# Patient Record
Sex: Female | Born: 1935 | Race: Black or African American | Hispanic: No | Marital: Married | Smoking: Former smoker
Health system: Southern US, Community
[De-identification: ages and names within clinical notes are randomized; demographics above are authoritative.]

## PROBLEM LIST (undated history)

## (undated) DIAGNOSIS — M1990 Unspecified osteoarthritis, unspecified site: Secondary | ICD-10-CM

## (undated) DIAGNOSIS — I2510 Atherosclerotic heart disease of native coronary artery without angina pectoris: Secondary | ICD-10-CM

## (undated) DIAGNOSIS — R42 Dizziness and giddiness: Secondary | ICD-10-CM

## (undated) DIAGNOSIS — I517 Cardiomegaly: Secondary | ICD-10-CM

## (undated) DIAGNOSIS — E7800 Pure hypercholesterolemia, unspecified: Secondary | ICD-10-CM

## (undated) DIAGNOSIS — I10 Essential (primary) hypertension: Secondary | ICD-10-CM

## (undated) DIAGNOSIS — F419 Anxiety disorder, unspecified: Secondary | ICD-10-CM

## (undated) DIAGNOSIS — R600 Localized edema: Secondary | ICD-10-CM

## (undated) DIAGNOSIS — K219 Gastro-esophageal reflux disease without esophagitis: Secondary | ICD-10-CM

## (undated) DIAGNOSIS — Z8711 Personal history of peptic ulcer disease: Secondary | ICD-10-CM

## (undated) DIAGNOSIS — G629 Polyneuropathy, unspecified: Secondary | ICD-10-CM

## (undated) DIAGNOSIS — I480 Paroxysmal atrial fibrillation: Secondary | ICD-10-CM

## (undated) DIAGNOSIS — Z8719 Personal history of other diseases of the digestive system: Secondary | ICD-10-CM

## (undated) DIAGNOSIS — M810 Age-related osteoporosis without current pathological fracture: Secondary | ICD-10-CM

## (undated) DIAGNOSIS — I5032 Chronic diastolic (congestive) heart failure: Secondary | ICD-10-CM

## (undated) DIAGNOSIS — M797 Fibromyalgia: Secondary | ICD-10-CM

## (undated) HISTORY — PX: LAPAROTOMY: SHX154

## (undated) HISTORY — PX: BREAST BIOPSY: SHX20

## (undated) HISTORY — DX: Chronic diastolic (congestive) heart failure: I50.32

## (undated) HISTORY — PX: ABDOMINAL HYSTERECTOMY: SHX81

## (undated) HISTORY — DX: Paroxysmal atrial fibrillation: I48.0

## (undated) HISTORY — PX: TONSILLECTOMY AND ADENOIDECTOMY: SUR1326

## (undated) HISTORY — PX: HERNIA REPAIR: SHX51

## (undated) HISTORY — PX: OTHER SURGICAL HISTORY: SHX169

## (undated) HISTORY — PX: CORONARY ANGIOPLASTY WITH STENT PLACEMENT: SHX49

## (undated) HISTORY — DX: Localized edema: R60.0

## (undated) HISTORY — PX: BREAST LUMPECTOMY: SHX2

## (undated) SURGERY — EGD (ESOPHAGOGASTRODUODENOSCOPY)
Anesthesia: Moderate Sedation | Laterality: Left

---

## 1969-05-03 HISTORY — PX: BREAST LUMPECTOMY: SHX2

## 1969-05-03 HISTORY — PX: ABDOMINAL HYSTERECTOMY: SHX81

## 1969-05-03 HISTORY — PX: BREAST BIOPSY: SHX20

## 1998-01-02 ENCOUNTER — Ambulatory Visit (HOSPITAL_COMMUNITY): Admission: RE | Admit: 1998-01-02 | Discharge: 1998-01-02 | Payer: Self-pay | Admitting: Cardiology

## 1998-09-02 HISTORY — PX: DIAGNOSTIC LAPAROSCOPY: SUR761

## 1998-09-02 HISTORY — PX: LAPAROSCOPIC SALPINGO OOPHERECTOMY: SHX5927

## 1998-09-14 ENCOUNTER — Encounter: Payer: Self-pay | Admitting: *Deleted

## 1998-09-14 ENCOUNTER — Emergency Department (HOSPITAL_COMMUNITY): Admission: EM | Admit: 1998-09-14 | Discharge: 1998-09-14 | Payer: Self-pay | Admitting: Emergency Medicine

## 1998-09-14 ENCOUNTER — Inpatient Hospital Stay (HOSPITAL_COMMUNITY): Admission: EM | Admit: 1998-09-14 | Discharge: 1998-09-29 | Payer: Self-pay | Admitting: *Deleted

## 1998-09-14 ENCOUNTER — Encounter: Payer: Self-pay | Admitting: Emergency Medicine

## 1998-09-19 ENCOUNTER — Encounter: Payer: Self-pay | Admitting: *Deleted

## 1998-09-21 ENCOUNTER — Encounter: Payer: Self-pay | Admitting: *Deleted

## 1998-09-22 ENCOUNTER — Encounter: Payer: Self-pay | Admitting: *Deleted

## 1998-09-24 ENCOUNTER — Encounter: Payer: Self-pay | Admitting: *Deleted

## 1998-09-24 ENCOUNTER — Encounter: Payer: Self-pay | Admitting: Obstetrics and Gynecology

## 1998-10-05 ENCOUNTER — Inpatient Hospital Stay (HOSPITAL_COMMUNITY): Admission: EM | Admit: 1998-10-05 | Discharge: 1998-10-10 | Payer: Self-pay | Admitting: Emergency Medicine

## 1998-10-05 ENCOUNTER — Encounter: Payer: Self-pay | Admitting: Cardiology

## 1998-10-06 ENCOUNTER — Encounter: Payer: Self-pay | Admitting: Cardiology

## 1998-10-07 ENCOUNTER — Encounter: Payer: Self-pay | Admitting: Emergency Medicine

## 1998-10-21 ENCOUNTER — Inpatient Hospital Stay (HOSPITAL_COMMUNITY): Admission: AD | Admit: 1998-10-21 | Discharge: 1998-10-21 | Payer: Self-pay | Admitting: Obstetrics and Gynecology

## 1998-10-22 ENCOUNTER — Encounter: Payer: Self-pay | Admitting: Surgery

## 1998-10-22 ENCOUNTER — Emergency Department (HOSPITAL_COMMUNITY): Admission: EM | Admit: 1998-10-22 | Discharge: 1998-10-22 | Payer: Self-pay | Admitting: Emergency Medicine

## 1998-10-29 ENCOUNTER — Encounter: Payer: Self-pay | Admitting: Emergency Medicine

## 1998-10-29 ENCOUNTER — Emergency Department (HOSPITAL_COMMUNITY): Admission: EM | Admit: 1998-10-29 | Discharge: 1998-10-29 | Payer: Self-pay | Admitting: Emergency Medicine

## 1998-11-24 ENCOUNTER — Emergency Department (HOSPITAL_COMMUNITY): Admission: EM | Admit: 1998-11-24 | Discharge: 1998-11-24 | Payer: Self-pay | Admitting: Emergency Medicine

## 1998-11-24 ENCOUNTER — Encounter: Payer: Self-pay | Admitting: Emergency Medicine

## 1998-11-25 ENCOUNTER — Emergency Department (HOSPITAL_COMMUNITY): Admission: EM | Admit: 1998-11-25 | Discharge: 1998-11-25 | Payer: Self-pay | Admitting: Emergency Medicine

## 1999-04-06 ENCOUNTER — Other Ambulatory Visit: Admission: RE | Admit: 1999-04-06 | Discharge: 1999-04-06 | Payer: Self-pay | Admitting: *Deleted

## 2000-05-16 ENCOUNTER — Encounter: Payer: Self-pay | Admitting: Cardiology

## 2000-05-16 ENCOUNTER — Observation Stay (HOSPITAL_COMMUNITY): Admission: EM | Admit: 2000-05-16 | Discharge: 2000-05-17 | Payer: Self-pay | Admitting: *Deleted

## 2000-06-12 ENCOUNTER — Emergency Department (HOSPITAL_COMMUNITY): Admission: EM | Admit: 2000-06-12 | Discharge: 2000-06-13 | Payer: Self-pay | Admitting: Emergency Medicine

## 2000-06-13 ENCOUNTER — Encounter: Payer: Self-pay | Admitting: Emergency Medicine

## 2000-06-17 ENCOUNTER — Other Ambulatory Visit: Admission: RE | Admit: 2000-06-17 | Discharge: 2000-06-17 | Payer: Self-pay | Admitting: *Deleted

## 2000-07-15 ENCOUNTER — Emergency Department (HOSPITAL_COMMUNITY): Admission: EM | Admit: 2000-07-15 | Discharge: 2000-07-15 | Payer: Self-pay | Admitting: Emergency Medicine

## 2000-07-15 ENCOUNTER — Encounter: Payer: Self-pay | Admitting: Emergency Medicine

## 2001-03-15 ENCOUNTER — Emergency Department (HOSPITAL_COMMUNITY): Admission: EM | Admit: 2001-03-15 | Discharge: 2001-03-16 | Payer: Self-pay | Admitting: Emergency Medicine

## 2001-03-15 ENCOUNTER — Encounter: Payer: Self-pay | Admitting: Emergency Medicine

## 2001-03-16 ENCOUNTER — Encounter: Payer: Self-pay | Admitting: Emergency Medicine

## 2001-06-19 ENCOUNTER — Emergency Department (HOSPITAL_COMMUNITY): Admission: EM | Admit: 2001-06-19 | Discharge: 2001-06-19 | Payer: Self-pay | Admitting: Emergency Medicine

## 2001-06-19 ENCOUNTER — Encounter: Payer: Self-pay | Admitting: Internal Medicine

## 2001-06-19 ENCOUNTER — Encounter: Admission: RE | Admit: 2001-06-19 | Discharge: 2001-06-19 | Payer: Self-pay | Admitting: Internal Medicine

## 2001-06-19 ENCOUNTER — Other Ambulatory Visit: Admission: RE | Admit: 2001-06-19 | Discharge: 2001-06-19 | Payer: Self-pay | Admitting: *Deleted

## 2001-09-09 ENCOUNTER — Emergency Department (HOSPITAL_COMMUNITY): Admission: EM | Admit: 2001-09-09 | Discharge: 2001-09-09 | Payer: Self-pay | Admitting: Emergency Medicine

## 2001-11-20 ENCOUNTER — Encounter: Payer: Self-pay | Admitting: Internal Medicine

## 2001-11-20 ENCOUNTER — Encounter: Admission: RE | Admit: 2001-11-20 | Discharge: 2001-11-20 | Payer: Self-pay | Admitting: Internal Medicine

## 2002-04-09 ENCOUNTER — Encounter: Payer: Self-pay | Admitting: Internal Medicine

## 2002-04-09 ENCOUNTER — Encounter: Admission: RE | Admit: 2002-04-09 | Discharge: 2002-04-09 | Payer: Self-pay | Admitting: Internal Medicine

## 2002-11-05 ENCOUNTER — Encounter: Payer: Self-pay | Admitting: *Deleted

## 2002-11-05 ENCOUNTER — Emergency Department (HOSPITAL_COMMUNITY): Admission: EM | Admit: 2002-11-05 | Discharge: 2002-11-05 | Payer: Self-pay | Admitting: Emergency Medicine

## 2002-11-05 ENCOUNTER — Emergency Department (HOSPITAL_COMMUNITY): Admission: EM | Admit: 2002-11-05 | Discharge: 2002-11-06 | Payer: Self-pay | Admitting: Emergency Medicine

## 2003-07-03 ENCOUNTER — Emergency Department (HOSPITAL_COMMUNITY): Admission: AD | Admit: 2003-07-03 | Discharge: 2003-07-03 | Payer: Self-pay | Admitting: Emergency Medicine

## 2004-07-23 ENCOUNTER — Ambulatory Visit (HOSPITAL_COMMUNITY): Admission: RE | Admit: 2004-07-23 | Discharge: 2004-07-23 | Payer: Self-pay | Admitting: Gastroenterology

## 2004-08-07 ENCOUNTER — Emergency Department (HOSPITAL_COMMUNITY): Admission: EM | Admit: 2004-08-07 | Discharge: 2004-08-07 | Payer: Self-pay | Admitting: Emergency Medicine

## 2004-09-16 ENCOUNTER — Emergency Department (HOSPITAL_COMMUNITY): Admission: EM | Admit: 2004-09-16 | Discharge: 2004-09-17 | Payer: Self-pay | Admitting: Emergency Medicine

## 2004-11-02 ENCOUNTER — Ambulatory Visit (HOSPITAL_COMMUNITY): Admission: RE | Admit: 2004-11-02 | Discharge: 2004-11-02 | Payer: Self-pay | Admitting: Family Medicine

## 2004-11-02 ENCOUNTER — Emergency Department (HOSPITAL_COMMUNITY): Admission: EM | Admit: 2004-11-02 | Discharge: 2004-11-02 | Payer: Self-pay | Admitting: Family Medicine

## 2005-06-13 ENCOUNTER — Inpatient Hospital Stay (HOSPITAL_COMMUNITY): Admission: EM | Admit: 2005-06-13 | Discharge: 2005-06-16 | Payer: Self-pay | Admitting: Family Medicine

## 2005-06-19 ENCOUNTER — Emergency Department (HOSPITAL_COMMUNITY): Admission: EM | Admit: 2005-06-19 | Discharge: 2005-06-20 | Payer: Self-pay | Admitting: Emergency Medicine

## 2005-06-27 ENCOUNTER — Inpatient Hospital Stay (HOSPITAL_COMMUNITY): Admission: EM | Admit: 2005-06-27 | Discharge: 2005-06-28 | Payer: Self-pay | Admitting: Emergency Medicine

## 2005-07-02 ENCOUNTER — Encounter: Admission: RE | Admit: 2005-07-02 | Discharge: 2005-07-02 | Payer: Self-pay | Admitting: Gastroenterology

## 2005-07-05 ENCOUNTER — Ambulatory Visit (HOSPITAL_COMMUNITY): Admission: RE | Admit: 2005-07-05 | Discharge: 2005-07-05 | Payer: Self-pay | Admitting: Gastroenterology

## 2005-07-14 ENCOUNTER — Encounter: Admission: RE | Admit: 2005-07-14 | Discharge: 2005-07-14 | Payer: Self-pay | Admitting: Gastroenterology

## 2005-09-09 ENCOUNTER — Emergency Department (HOSPITAL_COMMUNITY): Admission: EM | Admit: 2005-09-09 | Discharge: 2005-09-09 | Payer: Self-pay | Admitting: Family Medicine

## 2006-01-12 ENCOUNTER — Emergency Department (HOSPITAL_COMMUNITY): Admission: EM | Admit: 2006-01-12 | Discharge: 2006-01-12 | Payer: Self-pay | Admitting: Emergency Medicine

## 2006-03-03 ENCOUNTER — Emergency Department (HOSPITAL_COMMUNITY): Admission: EM | Admit: 2006-03-03 | Discharge: 2006-03-03 | Payer: Self-pay | Admitting: Emergency Medicine

## 2006-06-25 ENCOUNTER — Emergency Department (HOSPITAL_COMMUNITY): Admission: EM | Admit: 2006-06-25 | Discharge: 2006-06-25 | Payer: Self-pay | Admitting: Emergency Medicine

## 2006-08-27 ENCOUNTER — Observation Stay (HOSPITAL_COMMUNITY): Admission: EM | Admit: 2006-08-27 | Discharge: 2006-08-28 | Payer: Self-pay | Admitting: Emergency Medicine

## 2006-08-27 DIAGNOSIS — R42 Dizziness and giddiness: Secondary | ICD-10-CM

## 2006-08-27 HISTORY — DX: Dizziness and giddiness: R42

## 2006-09-14 ENCOUNTER — Emergency Department (HOSPITAL_COMMUNITY): Admission: EM | Admit: 2006-09-14 | Discharge: 2006-09-15 | Payer: Self-pay | Admitting: Emergency Medicine

## 2006-10-03 HISTORY — PX: CARDIAC CATHETERIZATION: SHX172

## 2006-10-14 ENCOUNTER — Inpatient Hospital Stay (HOSPITAL_COMMUNITY): Admission: EM | Admit: 2006-10-14 | Discharge: 2006-10-17 | Payer: Self-pay | Admitting: Family Medicine

## 2007-05-19 ENCOUNTER — Emergency Department (HOSPITAL_COMMUNITY): Admission: EM | Admit: 2007-05-19 | Discharge: 2007-05-19 | Payer: Self-pay | Admitting: Emergency Medicine

## 2007-05-21 ENCOUNTER — Emergency Department (HOSPITAL_COMMUNITY): Admission: EM | Admit: 2007-05-21 | Discharge: 2007-05-21 | Payer: Self-pay | Admitting: Emergency Medicine

## 2007-08-03 ENCOUNTER — Emergency Department (HOSPITAL_COMMUNITY): Admission: EM | Admit: 2007-08-03 | Discharge: 2007-08-03 | Payer: Self-pay | Admitting: Emergency Medicine

## 2007-12-19 ENCOUNTER — Emergency Department (HOSPITAL_COMMUNITY): Admission: EM | Admit: 2007-12-19 | Discharge: 2007-12-19 | Payer: Self-pay | Admitting: Emergency Medicine

## 2008-01-13 ENCOUNTER — Encounter: Admission: RE | Admit: 2008-01-13 | Discharge: 2008-01-13 | Payer: Self-pay | Admitting: Family Medicine

## 2008-02-25 ENCOUNTER — Inpatient Hospital Stay (HOSPITAL_COMMUNITY): Admission: EM | Admit: 2008-02-25 | Discharge: 2008-03-02 | Payer: Self-pay | Admitting: Emergency Medicine

## 2008-02-25 ENCOUNTER — Encounter (INDEPENDENT_AMBULATORY_CARE_PROVIDER_SITE_OTHER): Payer: Self-pay | Admitting: Cardiovascular Disease

## 2008-02-25 ENCOUNTER — Ambulatory Visit: Payer: Self-pay | Admitting: Vascular Surgery

## 2008-03-24 ENCOUNTER — Ambulatory Visit (HOSPITAL_COMMUNITY): Admission: RE | Admit: 2008-03-24 | Discharge: 2008-03-24 | Payer: Self-pay | Admitting: Cardiology

## 2008-09-13 ENCOUNTER — Emergency Department (HOSPITAL_COMMUNITY): Admission: EM | Admit: 2008-09-13 | Discharge: 2008-09-14 | Payer: Self-pay | Admitting: Emergency Medicine

## 2008-12-28 ENCOUNTER — Emergency Department (HOSPITAL_COMMUNITY): Admission: EM | Admit: 2008-12-28 | Discharge: 2008-12-28 | Payer: Self-pay | Admitting: *Deleted

## 2009-04-17 ENCOUNTER — Inpatient Hospital Stay (HOSPITAL_COMMUNITY): Admission: EM | Admit: 2009-04-17 | Discharge: 2009-04-19 | Payer: Self-pay | Admitting: Emergency Medicine

## 2009-08-22 ENCOUNTER — Encounter: Admission: RE | Admit: 2009-08-22 | Discharge: 2009-08-22 | Payer: Self-pay | Admitting: Internal Medicine

## 2009-09-16 ENCOUNTER — Emergency Department (HOSPITAL_COMMUNITY): Admission: EM | Admit: 2009-09-16 | Discharge: 2009-09-17 | Payer: Self-pay | Admitting: Emergency Medicine

## 2010-04-07 ENCOUNTER — Emergency Department (HOSPITAL_COMMUNITY): Admission: EM | Admit: 2010-04-07 | Discharge: 2010-04-07 | Payer: Self-pay | Admitting: Emergency Medicine

## 2010-11-05 ENCOUNTER — Other Ambulatory Visit: Payer: Self-pay | Admitting: Adult Health Nurse Practitioner

## 2010-11-05 ENCOUNTER — Ambulatory Visit
Admission: RE | Admit: 2010-11-05 | Discharge: 2010-11-05 | Disposition: A | Discharge status: Home / Self Care | Payer: Medicare Other | Source: Ambulatory Visit | Attending: Adult Health Nurse Practitioner | Admitting: Adult Health Nurse Practitioner

## 2010-11-05 DIAGNOSIS — R509 Fever, unspecified: Secondary | ICD-10-CM

## 2010-11-05 DIAGNOSIS — R05 Cough: Secondary | ICD-10-CM

## 2010-11-16 LAB — POCT I-STAT, CHEM 8
BUN: 9 mg/dL (ref 6–23)
Calcium, Ion: 1.1 mmol/L — ABNORMAL LOW (ref 1.12–1.32)
Chloride: 104 mEq/L (ref 96–112)
Creatinine, Ser: 1 mg/dL (ref 0.4–1.2)
Glucose, Bld: 122 mg/dL — ABNORMAL HIGH (ref 70–99)
Potassium: 3.1 mEq/L — ABNORMAL LOW (ref 3.5–5.1)

## 2010-11-16 LAB — CBC
MCV: 83.4 fL (ref 78.0–100.0)
Platelets: 216 10*3/uL (ref 150–400)
RBC: 4.93 MIL/uL (ref 3.87–5.11)
WBC: 6.2 10*3/uL (ref 4.0–10.5)

## 2010-11-16 LAB — URINALYSIS, ROUTINE W REFLEX MICROSCOPIC
Glucose, UA: NEGATIVE mg/dL
Hgb urine dipstick: NEGATIVE
Specific Gravity, Urine: 1.021 (ref 1.005–1.030)
Urobilinogen, UA: 0.2 mg/dL (ref 0.0–1.0)

## 2010-11-16 LAB — HEPATIC FUNCTION PANEL
ALT: 23 U/L (ref 0–35)
AST: 26 U/L (ref 0–37)
Albumin: 3.7 g/dL (ref 3.5–5.2)
Alkaline Phosphatase: 86 U/L (ref 39–117)
Total Protein: 6.8 g/dL (ref 6.0–8.3)

## 2010-11-16 LAB — DIFFERENTIAL
Eosinophils Relative: 1 % (ref 0–5)
Lymphocytes Relative: 45 % (ref 12–46)
Lymphs Abs: 2.8 10*3/uL (ref 0.7–4.0)
Neutro Abs: 3.1 10*3/uL (ref 1.7–7.7)

## 2010-11-18 LAB — DIFFERENTIAL
Basophils Relative: 1 % (ref 0–1)
Eosinophils Absolute: 0.1 10*3/uL (ref 0.0–0.7)
Eosinophils Relative: 2 % (ref 0–5)
Lymphs Abs: 2.1 10*3/uL (ref 0.7–4.0)
Monocytes Relative: 5 % (ref 3–12)
Neutrophils Relative %: 62 % (ref 43–77)

## 2010-11-18 LAB — BASIC METABOLIC PANEL
BUN: 8 mg/dL (ref 6–23)
CO2: 28 mEq/L (ref 19–32)
Chloride: 102 mEq/L (ref 96–112)
Creatinine, Ser: 0.79 mg/dL (ref 0.4–1.2)
Potassium: 3.7 mEq/L (ref 3.5–5.1)

## 2010-11-18 LAB — CBC
HCT: 37.5 % (ref 36.0–46.0)
MCHC: 33.4 g/dL (ref 30.0–36.0)
MCV: 84.1 fL (ref 78.0–100.0)
Platelets: 286 10*3/uL (ref 150–400)

## 2010-12-08 LAB — CARDIAC PANEL(CRET KIN+CKTOT+MB+TROPI)
Relative Index: 1.7 (ref 0.0–2.5)
Total CK: 244 U/L — ABNORMAL HIGH (ref 7–177)
Troponin I: 0.02 ng/mL (ref 0.00–0.06)

## 2010-12-08 LAB — BASIC METABOLIC PANEL
CO2: 27 mEq/L (ref 19–32)
Calcium: 8.9 mg/dL (ref 8.4–10.5)
Creatinine, Ser: 1.07 mg/dL (ref 0.4–1.2)
GFR calc Af Amer: >60 mL/min (ref >60)
GFR calc non Af Amer: 51 mL/min — ABNORMAL LOW (ref >60)
Glucose, Bld: 112 mg/dL — ABNORMAL HIGH (ref 70–99)
Sodium: 137 mEq/L (ref 135–145)

## 2010-12-08 LAB — CBC
HCT: 38.4 % (ref 36.0–46.0)
Hemoglobin: 12.4 g/dL (ref 12.0–15.0)
Hemoglobin: 12.7 g/dL (ref 12.0–15.0)
MCHC: 33.6 g/dL (ref 30.0–36.0)
MCV: 85.4 fL (ref 78.0–100.0)
MCV: 85.6 fL (ref 78.0–100.0)
Platelets: 226 10*3/uL (ref 150–400)
Platelets: 245 10*3/uL (ref 150–400)
RBC: 4.06 MIL/uL (ref 3.87–5.11)
RBC: 4.5 MIL/uL (ref 3.87–5.11)
RDW: 14.7 % (ref 11.5–15.5)
WBC: 6.9 10*3/uL (ref 4.0–10.5)
WBC: 7.2 10*3/uL (ref 4.0–10.5)

## 2010-12-08 LAB — DIFFERENTIAL
Basophils Absolute: 0.3 10*3/uL — ABNORMAL HIGH (ref 0.0–0.1)
Basophils Relative: 4 % — ABNORMAL HIGH (ref 0–1)
Eosinophils Absolute: 0.1 10*3/uL (ref 0.0–0.7)
Eosinophils Relative: 1 % (ref 0–5)
Lymphocytes Relative: 31 % (ref 12–46)
Monocytes Absolute: 0.4 10*3/uL (ref 0.1–1.0)

## 2010-12-08 LAB — CK TOTAL AND CKMB (NOT AT ARMC)
CK, MB: 4.2 ng/mL — ABNORMAL HIGH (ref 0.3–4.0)
Relative Index: 1.4 (ref 0.0–2.5)
Total CK: 292 U/L — ABNORMAL HIGH (ref 7–177)

## 2010-12-08 LAB — HEPATIC FUNCTION PANEL
ALT: 21 U/L (ref 0–35)
AST: 26 U/L (ref 0–37)
Albumin: 3.2 g/dL — ABNORMAL LOW (ref 3.5–5.2)
Alkaline Phosphatase: 67 U/L (ref 39–117)
Bilirubin, Direct: <0.1 mg/dL (ref 0.0–0.3)
Total Bilirubin: 0.6 mg/dL (ref 0.3–1.2)

## 2010-12-08 LAB — TROPONIN I: Troponin I: 0.01 ng/mL (ref 0.00–0.06)

## 2010-12-08 LAB — LIPID PANEL
HDL: 42 mg/dL (ref >39)
Total CHOL/HDL Ratio: 6.5 RATIO
Triglycerides: 113 mg/dL (ref <150)
VLDL: 23 mg/dL (ref 0–40)

## 2010-12-08 LAB — LIPASE, BLOOD: Lipase: 115 U/L — ABNORMAL HIGH (ref 11–59)

## 2010-12-08 LAB — POCT CARDIAC MARKERS
CKMB, poc: 2.8 ng/mL (ref 1.0–8.0)
Myoglobin, poc: 141 ng/mL (ref 12–200)
Troponin i, poc: <0.05 ng/mL (ref 0.00–0.09)

## 2010-12-08 LAB — TSH: TSH: 3.109 u[IU]/mL (ref 0.350–4.500)

## 2010-12-08 LAB — APTT: aPTT: 52 seconds — ABNORMAL HIGH (ref 24–37)

## 2010-12-12 LAB — POCT I-STAT, CHEM 8
BUN: 15 mg/dL (ref 6–23)
Creatinine, Ser: 1.1 mg/dL (ref 0.4–1.2)
Glucose, Bld: 114 mg/dL — ABNORMAL HIGH (ref 70–99)
Potassium: 3.6 mEq/L (ref 3.5–5.1)
Sodium: 142 mEq/L (ref 135–145)
TCO2: 30 mmol/L (ref 0–100)

## 2010-12-12 LAB — DIFFERENTIAL
Eosinophils Relative: 1 % (ref 0–5)
Lymphocytes Relative: 36 % (ref 12–46)
Lymphs Abs: 2.4 10*3/uL (ref 0.7–4.0)
Monocytes Relative: 5 % (ref 3–12)

## 2010-12-12 LAB — CBC
HCT: 37.5 % (ref 36.0–46.0)
MCV: 86.1 fL (ref 78.0–100.0)
Platelets: 286 10*3/uL (ref 150–400)
RBC: 4.36 MIL/uL (ref 3.87–5.11)
WBC: 6.6 10*3/uL (ref 4.0–10.5)

## 2010-12-12 LAB — POCT CARDIAC MARKERS
CKMB, poc: 1.7 ng/mL (ref 1.0–8.0)
Myoglobin, poc: 88.5 ng/mL (ref 12–200)

## 2010-12-17 LAB — CBC
HCT: 39.9 % (ref 36.0–46.0)
Hemoglobin: 12.9 g/dL (ref 12.0–15.0)
MCHC: 32.4 g/dL (ref 30.0–36.0)
MCV: 85.1 fL (ref 78.0–100.0)
Platelets: 274 10*3/uL (ref 150–400)
RBC: 4.69 MIL/uL (ref 3.87–5.11)
RDW: 15.4 % (ref 11.5–15.5)
WBC: 8.5 10*3/uL (ref 4.0–10.5)

## 2010-12-17 LAB — COMPREHENSIVE METABOLIC PANEL
ALT: 20 U/L (ref 0–35)
AST: 21 U/L (ref 0–37)
Albumin: 3.7 g/dL (ref 3.5–5.2)
Alkaline Phosphatase: 80 U/L (ref 39–117)
BUN: 13 mg/dL (ref 6–23)
CO2: 33 mEq/L — ABNORMAL HIGH (ref 19–32)
Calcium: 9.4 mg/dL (ref 8.4–10.5)
Chloride: 101 mEq/L (ref 96–112)
Creatinine, Ser: 1.01 mg/dL (ref 0.4–1.2)
GFR calc Af Amer: >60 mL/min (ref >60)
GFR calc non Af Amer: 54 mL/min — ABNORMAL LOW (ref >60)
Glucose, Bld: 109 mg/dL — ABNORMAL HIGH (ref 70–99)
Potassium: 3.9 mEq/L (ref 3.5–5.1)
Sodium: 144 mEq/L (ref 135–145)
Total Bilirubin: 0.5 mg/dL (ref 0.3–1.2)
Total Protein: 6.8 g/dL (ref 6.0–8.3)

## 2010-12-17 LAB — URINALYSIS, ROUTINE W REFLEX MICROSCOPIC
Nitrite: NEGATIVE
Protein, ur: NEGATIVE mg/dL
Specific Gravity, Urine: 1.026 (ref 1.005–1.030)
Urobilinogen, UA: 0.2 mg/dL (ref 0.0–1.0)

## 2010-12-17 LAB — DIFFERENTIAL
Basophils Absolute: 0 10*3/uL (ref 0.0–0.1)
Basophils Relative: 0 % (ref 0–1)
Eosinophils Absolute: 0.1 10*3/uL (ref 0.0–0.7)
Eosinophils Relative: 1 % (ref 0–5)
Lymphocytes Relative: 35 % (ref 12–46)
Lymphs Abs: 3 10*3/uL (ref 0.7–4.0)
Monocytes Absolute: 0.4 10*3/uL (ref 0.1–1.0)
Monocytes Relative: 5 % (ref 3–12)
Neutro Abs: 5 10*3/uL (ref 1.7–7.7)
Neutrophils Relative %: 59 % (ref 43–77)

## 2010-12-17 LAB — POCT I-STAT 3, ART BLOOD GAS (G3+)
Acid-Base Excess: 4 mmol/L — ABNORMAL HIGH (ref 0.0–2.0)
Bicarbonate: 29.4 mEq/L — ABNORMAL HIGH (ref 20.0–24.0)
Patient temperature: 98.6

## 2010-12-19 ENCOUNTER — Other Ambulatory Visit: Payer: Self-pay | Admitting: Internal Medicine

## 2010-12-19 DIAGNOSIS — R1032 Left lower quadrant pain: Secondary | ICD-10-CM

## 2010-12-26 ENCOUNTER — Ambulatory Visit
Admission: RE | Admit: 2010-12-26 | Discharge: 2010-12-26 | Disposition: A | Discharge status: Home / Self Care | Payer: Medicare Other | Source: Ambulatory Visit | Attending: Internal Medicine | Admitting: Internal Medicine

## 2010-12-26 DIAGNOSIS — R1032 Left lower quadrant pain: Secondary | ICD-10-CM

## 2010-12-26 MED ORDER — IOHEXOL 300 MG/ML  SOLN
125.0000 mL | Freq: Once | INTRAMUSCULAR | Status: AC | PRN
  Administered 2010-12-26: 125 mL via INTRAVENOUS

## 2011-01-15 NOTE — H&P (Signed)
[name], [name]              ACCOUNT NO.:  [id number]   MEDICAL RECORD NO.:  [id number]          PATIENT TYPE:  INP   LOCATION:  1824                         FACILITY:  [facility]   PHYSICIAN:  [name], M.D.DATE OF BIRTH:  [birth year]   DATE OF ADMISSION:  08/03/2007  DATE OF DISCHARGE:                              HISTORY & PHYSICAL   CHIEF COMPLAINT:  Chest pain.   HISTORY OF PRESENT ILLNESS:  [name] is a pleasant, 75 year old  female followed by [name] and [name] with a I of coronary  disease. She had a LAD CYPHER stent June 15, 2005. She had had a  negative nuclear study in April 2006 prior to this. She was readmitted  June 27, 2005 with chest pain that was felt to be noncardiac and she  had a GI evaluation by [name]. She was readmitted again in February  2008 with chest pain. Catheterization was done by [name]. This  revealed a 50% proximal RCA and 40% LAD and normal LV function. She did  have a Myoview during that admission prior to discharge which showed no  ischemia with good LV function. Prior to this admission, she had had  some problems with multiple medications and had actually been off her  Toprol, Crestor, Cardizem and Plavix. We had resumed her Plavix at  discharge and she seemed to be tolerating this. She has seen [name]  in followup and last saw him on June 12, 2007. At that time, she was  doing well. She was not having chest pain. She was not on her Plavix  which she apparently ended up stopping again in the spring of 2008  because of side effects. She is seen now with complaints again of  substernal chest pain and pressure. She actually had symptoms starting a  week or so ago. Her symptoms are not exertional. Over the weekend, she  had some increase in her symptoms with some associated left arm pain and  jaw pain. She denies any nausea, vomiting or diaphoresis although she  does say she has some dyspnea.   CURRENT  MEDICATIONS:  1. Diovan 160 mg a day.  2. Aspirin 81 mg a day.  3. Lasix 80 mg a day.  4. Multivitamin.  5. Potassium 20 mEq a day.  6. Nexium 40 mg a day.  7. Claritin 10 mg a day.  8. Lyrica 50 mg a day.   She is intolerant to ACE INHIBITORS although the reason is not  documented.   SOCIAL HISTORY:  She is a retired Landscape architect.  She is a nonsmoker. She is married and lives with her husband. She has a  set of twins, one is at [place] and one is at [facility].   FAMILY HISTORY:  Unremarkable.   REVIEW OF SYSTEMS:  Essentially unremarkable except for as noted above.   PHYSICAL EXAMINATION:  Blood pressure 138/80, pulse 90, temperature 98,  O2 sat is 96% on room air.  GENERAL:  She is a well-developed, well-nourished, African-American  female in no acute distress.  HEENT:  Normocephalic. Extraocular movements intact. Sclera is  nonicteric.  NECK:  Without JVD or bruit.  CHEST:  Clear to auscultation and percussion.  CARDIAC:  Regular rate and rhythm without murmurs, rubs or gallops.  Normal S1, S2.  ABDOMEN:  Nontender. No hepatosplenomegaly.  EXTREMITIES:  Without edema. Distal pulses are intact.  NEUROLOGIC:  Grossly intact. She is awake, alert, oriented and  cooperative and moves all extremities without obvious deficit.  SKIN:  Warm and dry.   EKG reveals sinus rhythm without acute changes.   IMPRESSION:  1. Chest pain consistent with unstable angina.  2. Known coronary disease with CYPHER stent placement June 15, 2005, relook October 15, 2006 showing no restenosis, the patient      has subsequently been intolerant to Plavix and has stopped this.  3. Treated hypertension.  4. Gastroesophageal reflux followed by [name].  5. Prior history of multiple abdominal surgeries in the past or      abdominal pain.  6. History of dyslipidemia with a statin intolerance.   PLAN:  The patient will need to be admitted to rule out MI. Will  need to  once again discuss her medications including Plavix and statins. She may  need restudy versus nuclear evaluation.      [name], P.A.    ______________________________  [name], M.D.    [name]  D:  08/03/2007  T:  08/03/2007  Job:  [id number]
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
## 2011-01-15 NOTE — Discharge Summary (Signed)
[name], [name]              ACCOUNT NO.:  [id number]   MEDICAL RECORD NO.:  [id number]          PATIENT TYPE:  INP   LOCATION:  2031                         FACILITY:  [facility]   PHYSICIAN:  Madaline Savage, M.D.DATE OF BIRTH:  1936/02/15   DATE OF ADMISSION:  02/25/2008  DATE OF DISCHARGE:  03/02/2008                               DISCHARGE SUMMARY   DISCHARGE DIAGNOSES:  1. Chest pain, negative myocardial infarction.  2. Coronary disease with cardiac cath at this admission which does      reveal 60% ostial right coronary artery lesion with damping of the      catheter and previous stent to the mid left anterior descending      with patent and she has 30% ostial circumflex stenosis.  Ejection      fraction was 60%.  She did have mitral valve prolapse with trace      mitral regurgitation.      a.     Nuclear stress test Persantine Myoview was negative for       ischemia on the day after the cath to ensure that the 60% right       coronary artery lesion was not causing any chest pain.  3. Significant headache with Imdur.  4. Left leg edema.  Venous Dopplers were negative.  Abdominal and      pelvic CT negative for source of left leg edema.      a.     Plan to do outpatient arterial Dopplers.  5. Dyslipidemia.  6. Fibromyalgia.   DISCHARGE CONDITION:  Stable.   PROCEDURES:  February 29, 2008, combined left heart cath by Dr. Julieanne Manson.   DISCHARGE MEDICATIONS:  1. Aspirin 81 mg daily.  2. Claritin 10 mg daily.  3. Lasix 80 daily.  4. Nexium 40 mg, she was every other day but now she will take it      daily.  5. Potassium 20 mEq daily.  6. Diovan, we increased it to 320 mg daily.  7. Xanax 0.5 mg 3 times a day as needed.  8. Crestor 10 mg daily.  9. Multivitamins once daily.  10.Addition of Lopressor 25 mg one twice a day.   DISCHARGE INSTRUCTIONS:  1. Low-sodium heart-healthy diet, though she was instructed she could      be borderline diabetic.  She is to watch  her diet with      carbohydrates, no or very little sweets, change bread to wheat or      dark bread, sweet potatoes better than white potatoes.  2. Wash right groin cath site with soap and water.  Call if any      bleeding, swelling, or drainage.  3. Increase activity slowly.  4. No lifting for 1 day and no driving for 1 day, may shower.  5. Follow up with Dr. Elsie Lincoln on March 22, 2008 at 9:30 a.m.  6. Follow up with Dr. Renae Gloss for borderline sugars.  7. Follow up with Dr. Corliss Skains for fibromyalgia.  8. Wear wooden boot while weightbearing.   HISTORY OF PRESENT ILLNESS:  A 75 year old African American  female who  presented to the emergency room on February 25, 2008 with complaints of  shortness of breath and chest tightness.  She developed left arm pain 2  days prior to admission which was persistent and the night before  admission she was very uncomfortable with dyspnea, chest tightness, left  arm and back pain.  She called [facility] and was  advised to go to the emergency room.  She also relates she had been  having foot swelling for 2 months and few days ago was seen in the [facility].  She was told us it was not DVT, but referred to the  cardiologist and vascular specialist.  She did take nitroglycerin at  home which did partially relieved her chest pain.   ALLERGIES:  ACE INHIBITOR, PLAVIX caused rash, IMDUR caused severe  headache, TORADOL allergy, ZOCOR allergy, and CODEINE.   Please note, on the patient's the leg swelling, she originally had pain,  saw Dr. Corliss Skains for her edema, had any foot pain, injected her foot  with steroids and since that time pain continues and still with left leg  swelling.  She has also seen orthopedist.  She saw Dr. Renae Gloss and MRI  of the ankle and foot have been done and no reason for the swelling is  noted.   FAMILY HISTORY/SOCIAL HISTORY/REVIEW OF SYSTEMS:  See H&P.  Please note,  the patient does have a history of  coronary disease with history of mid  LAD intervention with Cypher stent in the past and this was in 2006.  Left main was 20%, RCA was 40%.   She also had some mild pulmonary hypertension.   The patient was admitted to [facility], actually to CCU and with  plans for cardiac catheterization.  She was placed on IV heparin and  nitro.  Plans were for angiogram.  The patient's D-dimer was negative.  CT of the chest showed no PE and she then underwent cardiac  catheterization, unfortunately unable to do on Friday the 26th due to  full schedule and she was held and it was done on February 29, 2008 by Dr.  Clarene Duke with results as previously stated.  She then had nuke study which  was negative and then by March 02, 2008 CT of the abdomen and pelvis were  again negative for causes of her left leg edema.  Dr. Elsie Lincoln requested  wooden boot on her foot and felt she was ready for discharge home.   DISCHARGE PHYSICAL EXAMINATION:  VITAL SIGNS:  Blood pressure 136/80,  pulse 72, respirations 18, temperature 97.8, and oxygen saturation on  room air 94%.  No change in physical exam.  Cath site was stable.   LABORATORY DATA:  Initial labs, hemoglobin 12.4, hematocrit 38.2, WBC  8.1, and platelets 258.  These remained stable throughout  hospitalization.  At discharge, hemoglobin 11.8, hematocrit 35.2.   Protime on admission 13.9, INR 1.1, PTT 99, and D-dimer was 0.24.  On  heparin, she was therapeutic.   Chemistry:  Sodium 139, potassium 3.8, chloride 104, CO2 31, glucose  157, BUN 12, creatinine 1.1, and GFR greater than 60.  Magnesium was  2.1, calcium 9.1.  These remained stable.  Glucose was slightly elevated  109, 105, and 157 on admission.   Glycohemoglobin was 6.2, mean plasma glucose was 43.   Cardiac markers initially myoglobin 116, troponin 0.05, and CK-MB was  1.7 and followup CKs 174, 161, MBs 1.6, 1.7, troponin I 0.01, all  negative for MI.  BNP on admission was 70.  TSH 2.962.   Total  cholesterol 168, triglycerides 60, HDL 41, LDL 107.   RADIOLOGY RESULTS:  Original CT of her chest on the 25th, suboptimal  inspiration, no active disease.  CT angiogram, no acute disease in the  chest, no PE, there are few small emphysematous blebs.  Nuke study  revealed no ischemia, EF was 75%, and normal wall motion.   CT of the abdomen, no acute abnormalities that are significant or  significant change.  She does have a right hepatic cyst.  CT of the  pelvis, descending sigmoid and colonic diverticulosis identified without  evidence of diverticulitis.  She has had a hysterectomy, appendix, and  the remainder of the bowel are unremarkable.  The bladder is within  normal limits.  There is no evidence of mass, enlarged lymph node, or  free fluid.  No acute or suspicious bony abnormalities were identified.  Mild disk bulge within the lumbar spine, appears stable.  No pelvic  abnormalities to suggest a cause for the patient's left lower extremity  swelling.  EKGs on admission, sinus rhythm, no acute abnormality.  Followups remained the same to nonspecific T-wave abnormality.   Please note, when the patient was originally seen in the emergency room  in April with the initial left foot pain, she stated she had stretched  that foot and felt something pop in the foot and since then issues have  arisen.  The patient will have lower extremity arterial Dopplers in our  office and then follow up with Dr. Elsie Lincoln.      [name]. [name], N.P.    ______________________________  Madaline Savage, M.D.    LRI/MEDQ  D:  03/02/2008  T:  03/03/2008  Job:  [id number]   cc:   Merlene Laughter. Renae Gloss, M.D.  Kathryne Hitch, MD

## 2011-01-15 NOTE — Cardiovascular Report (Signed)
[name], [name]              ACCOUNT NO.:  [id number]   MEDICAL RECORD NO.:  [id number]           PATIENT TYPE:   LOCATION:                                 FACILITY:   PHYSICIAN:  Thereasa Solo. Little, M.D. DATE OF BIRTH:  02/19/1936   DATE OF PROCEDURE:  DATE OF DISCHARGE:                            CARDIAC CATHETERIZATION   INDICATIONS FOR TEST:  This 75 year old female was admitted with chest  pain.  She had had a Cypher stent placed to her LAD, October 2006.  She  developed a rash from Plavix, and the Plavix was discontinued about a  year ago.  She began having what she describes as the gaseous sensation  with some awareness of discomfort in her left arm.  Her cardiac markers  were unremarkable.  EKG is negative.  She is brought to the cath lab for  cardiac catheterization.   After obtaining informed consent, the patient was prepped and draped in  the usual sterile fashion exposing the right groin.  Following local  anesthetic with 1% Xylocaine, the Seldinger technique was employed and a  5-French introducer sheath was placed into the right femoral artery.  A  Smart needle was used for arterial access.   Left and right coronary arteriography, ventriculography, and a distal  aortogram was performed.   EQUIPMENT:  5-French Judkins configuration catheters.   TOTAL CONTRAST:  130 mL.   MEDICATIONS:  1 mg of IV Versed and 200 mcg of intracoronary  nitroglycerin and 10 of labetalol.   COMPLICATIONS:  At the end of the procedure, the sheath clotted off.  We  were able to aspirate the clot out of it.  It again clotted as second  time, we once again were able to aspirate the clot out and then removed  the sheath on the table.   RESULTS:  1. Hemodynamic monitoring:  Her central aortic pressure was 196/95.      Her left ventricular pressure was 188/12 with no significant      gradient appreciated at the time of pullback.  Her heart rate was      in the mid 60s and sinus.  2.  Ventriculography:  Ventriculography in the RAO projection done at      the end of the case using 25 mL or 12 mL per second revealed mild      left ventricular hypertrophy.  Ejection fraction was in excess of      60%.  There was mitral valve prolapse with trace mitral      regurgitation.  3. Distal aortogram:  A distal aortogram done at the level of the      renal artery showed no renal artery stenosis, no infrarenal      aneurysm, and no proximal iliac disease.  4. Coronary arteriography:  On fluoroscopy, there was a fleck of      calcium at the ostium of the RCA.      a.     Left main normal that bifurcated.      b.     LAD.  The LAD crossed the apex of this heart.  There was a       stent in the mid LAD that was widely patent.  The LAD itself was       free of significant disease and there was a small diagonal branch       that had minimal ostial narrowing.      c.     Circumflex.  The circumflex gave rise to 3 OM vessels.  They       were all free of disease.  There was 30% ostial narrowing of the       circumflex.      d.     Right coronary artery.  The right coronary artery had ostial       60% narrowing.  A 5-French catheter would damp each time the right       coronary artery was engaged.  I gave her 200 mcg of       intracoronary/aortic root IV nitroglycerin.  The entire vessel       became larger and the ostium still appeared to be around 60%       narrowed.  The PDA and posterior lateral vessels were free of       disease.   CONCLUSION:  1. Normal LV function.  2. Mild LVH.  3. No renal artery stenosis, abdominal aortic aneurysm.   Widely patent stent in the LAD.   Ostial 60% narrowing of the RCA with damping her 5-French catheter.   I reviewed the cardiac catheter from 2008.  At that time, she had the  same amount of ostial narrowing in the right coronary arteries as seen  today.  To resolve this issue, a Cardiolite study could be performed, if  there is inferior  ischemia, then slightly high risk and complex ostial  RCA intervention can be performed.   For the time being, I have placed her on Imdur 30, increased her Diovan  from 160 to 320.  I left her Lopressor at 25 mg b.i.d. because her heart  rate is currently in the mid 60s.           ______________________________  Thereasa Solo. Little, M.D.     ABL/MEDQ  D:  02/29/2008  T:  03/01/2008  Job:  [id number]   cc:   Madaline Savage, M.D.  Cath Lab  [place] R. Renae Gloss, M.D.

## 2011-01-18 NOTE — H&P (Signed)
[name], [name] NO.:  [id number]   MEDICAL RECORD NO.:  [id number]          PATIENT TYPE:  EMS   LOCATION:  MAJO                         FACILITY:  [facility]   PHYSICIAN:  Lonia Blood, M.D.DATE OF BIRTH:  11/03/1935   DATE OF ADMISSION:  08/27/2006  DATE OF DISCHARGE:                              HISTORY & PHYSICAL   PRIMARY CARE PHYSICIAN:  Dr. Kellie Shropshire   CHIEF COMPLAINT:  Severe vertigo.   HISTORY OF PRESENT ILLNESS:  [name] is a very  pleasant 75 year old female with a medical history as noted below.  On  history, she voices innumerable complaints.  When redirected, however,  she does focus on the primary issue which brought her to the emergency  room which is vertigo.  She was not experiencing vertigo until this  morning.  Yesterday, she was her usual self.  Upon waking this morning,  she was laying in bed, opened my eyes, and suddenly everything was  moving around in circles.  This patient has not experienced vertigo  previously.  She has not suffered any traumas to the head.  She is not  having difficulty with ear pain or decreased auditory acuity in either  ear.  There is no sense of sinus congestion.  There is no cough, fevers,  chills, nausea, or vomiting.  There has been no diarrhea.  Her p.o.  intake has been normal.  The patient denied initially of having a  headache but now has a diffuse bifrontal headache.  She has been treated  with multiple antihistamines in the emergency room, but her symptoms  have not improved.  She has had a CT scan of the head which has been  unremarkable.  The patient denies focal neurologic deficits.  She has  not had acute visual change.   REVIEW OF SYSTEMS:  [name] describes an extremely long list  of positives on her review of systems.  I will not list them all here,  as they are apparently all chronic complaints that have been evaluated  in the outpatient setting by Dr.  Renae Gloss or Dr. Elsie Lincoln.  In short,  however, there are no positive elements in the review of systems that I  feel are related to the patient's symptoms of vertigo.   PAST MEDICAL HISTORY:  1. Coronary artery disease, status post PTCA with drug-eluting stent      to a 90% LAD lesion in October 2006.  2. Hypertension with left ventricular hypertrophy via cardiac cath.  3. Hyperlipidemia.  4. Gastroesophageal reflux disease with hiatal hernia.  5. Mild pulmonary hypertension.  6. Obesity.  7. Apparent history of multiple laparotomies with lysis of adhesions      and bowel obstructions.   OUTPATIENT MEDICATIONS:  1. Aspirin.  2. Crestor 10 mg daily.  3. Lasix 80 mg daily.  4. Alprazolam 0.25 mg t.i.d.  5. Lisinopril 10 mg daily.  6. Carafate 1 g daily.  7. Diovan 40 mg daily.  8. Potassium chloride - dose unclear.  9. Biaxin and Septra, though course and indication not clear and      unable to be clarified by the  patient.   ALLERGIES:  No known drug allergies.   FAMILY HISTORY:  The patient's family history is reviewed in her old  records and the computer system.  There are no elements that are felt to  be contributory to this admission.   SOCIAL HISTORY:  The patient is married.  She has 3 children.  She does  not smoke.  She does not drink a significant amount of alcohol.   DATA REVIEW:  Sodium is normal, potassium is low at 3.3.  Chloride,  bicarb, BUN, and creatinine are normal.  Serum glucose is 104.  pH is  7.39 with a pCO2 of 45 and CT scan of the head reveals no acute disease.  No other labs or studies are available/have been ordered.   PHYSICAL EXAMINATION:  VITAL SIGNS:  Temperature 97.8, blood pressure  167/79, heart rate 86, respiratory rate 24, O2 saturations 97% on room  air.  GENERAL:  Well-developed, well-nourished female in no acute respiratory  distress.  HEENT:  Normocephalic, atraumatic.  Pupils equal, round, and reactive to  light and accommodation.   Extraocular muscles intact bilaterally.  __________ clear.  NECK:  No JVD, no lymphadenopathy, no thyromegaly.  LUNGS:  Clear to auscultation bilaterally without wheezes or rhonchi.  CARDIOVASCULAR:  Regular rate and rhythm without murmur, gallop, or rub.  Normal S1 and S2.  ABDOMEN:  Obese, soft.  Bowel sounds present.  No hepatosplenomegaly.  No rebound, no ascites.  EXTREMITIES:  Trace bilateral lower extremity edema at 1+ without  significant erythema.  NEUROLOGIC:  Cranial nerves 2-12 intact bilaterally.  There is no  nystagmus vertically or horizontally.  Pupils equal, round, and reactive  to light and accommodation.  Cranial nerves 2-12 are intact and  symmetric.  The patient displays 5/5 strength bilateral upper and lower  extremities.  There is no paresthesia.  There is no Babinski.   IMPRESSION AND PLAN:  1. Vertigo.  The patient's vertigo is debilitating and prevents her      from being able to walk.  When I sit the patient up at her bedside,      she begins to waver severely.  Exam of the ears in unremarkable.      Given this patient's vascular risk factors, I am concerned that      there is a reasonably high likelihood of a cerebellar or brain stem      stroke that could lead to these symptoms.  This has occupational      therapy been evaluated in the emergency room.  I will place the      patient in the hospital for 23-hour observation.  I am ordering an      MRI of the brain to rule out the possibility of a cerebrovascular      accident as the etiology of this patient's vertigo.  Otherwise, we      will treat the patient aggressively with antihistamines and begin      to ambulate her with physical therapy in attempts to exhaust the      patient's vertigo.  2. Coronary artery disease.  This appears to be stable.  The patient      denies symptoms of chest pain or angina. 3. Abdominal discomfort.  The patient complains of abdominal      discomfort that is apparently  chronic.  Given her history, per her      report of bowel obstructions and adhesions, I will, however, check  a KUB to assure there is not evidence of an obstruction at the      present time.  4. Hypertension.  The patient's blood pressure is poorly controlled at      the present time.  She admits that she did not take her blood      pressure medicine this morning, however.  I will keep her on her      usual medicines and simply follow her trend.  5. Hypokalemia.  The patient's hypokalemia is felt to be secondary to      decreased p.o. intake in the face of ongoing Lasix therapy.  I will      hold her Lasix and will replete her potassium.   This is a 23-hour observation admission.      Lonia Blood, M.D.  Electronically Signed     JTM/MEDQ  D:  08/27/2006  T:  08/27/2006  Job:  [id number]   cc:   Merlene Laughter. Renae Gloss, M.D.

## 2011-01-18 NOTE — Consult Note (Signed)
NAMEREXANNA, LOUTHAN NO.:  [id number]   MEDICAL RECORD NO.:  [id number]          PATIENT TYPE:  INP   LOCATION:  3742                         FACILITY:  [facility]   PHYSICIAN:  Genene Churn. Love, M.D.    DATE OF BIRTH:  05-31-1936   DATE OF CONSULTATION:  08/28/2006  DATE OF DISCHARGE:                                 CONSULTATION   This 75 year old right-handed, black married female from [place],  [place] seen in the hospital for evaluation of dizziness.   HISTORY OF PRESENT ILLNESS:  Ms. Darwish has a known prior history  of hypertension for 20 years, coronary artery disease status post stent  placement in November 2006, bowel obstruction surgery in 2000, but  otherwise has been in good health.   MEDICATIONS AT HOME:  1. Sular 20 mg daily.  2. Diovan 160 mg daily.,  3. Lasix 80 mg daily.  4. Allegra.  She has not been on aspirin.   She was in her usual state of health until she awoke on the morning of  December 26, 207, noting a sensation of vertigo with spinning.  She  noticed that she tended to fall to her right.  There was nausea but no  vomiting.  She denies any headache, double vision, ringing in her ears  loss of hearing, chest pain, palpitations, or syncope.  The initial  episode lasted approximately 1 hour.  She came to the emergency room and  was admitted by the [facility].  She noticed that she could  not walk by herself, and she noticed that her equilibrium was off.   PHYSICAL EXAMINATION:  GENERAL:  Well-developed pleasant female no acute  stress.  Blood pressure in the right 140/80, heart rate was 64.  There  was no change going from the sitting to standing position in blood  pressure; no bruits were heard.  The left tympanic membrane was clear;  the right could not be seen.  MENTAL STATUS:  She was alert and oriented x3.  She followed 1, 2, and 3  step commands.  Her cranial nerve examination revealed visual fields to  be full.  Disks were flat.  There was no nystagmus at this time, but she  was not dizzy.  Her tongue was benign.  The uvula midline.  Gags were  present.  There was no seventh nerve palsy.  She had good shoulder  shrug.  Her motor examination revealed 5/5 strength proximally and  distally in the upper and lower extremities without any evidence of  proximal pronator or distal drift.  Her coordination testing revealed  finger-to-nose, heel-to-shin, and rapid alternating movements to be  normal.  Sensory examination was intact to pinprick, light touch,  __________ vibration testing.  Deep tendon reflexes were 1-2+, and  standing with her head and eyes closed.  She had Romberg positive to her  right.  Turning her head to the right, she tended to fall to the right.  Turning her head to the left, she tended to fall to her right as well.  MRI study of the brain was unremarkable.   IMPRESSION:  Suspect positional vertigo, right  ear involvement.  Plan at  this time is to treat her with Ativan 0.25 mg t.i.d. for suspected  positional vertigo.           ______________________________  Genene Churn. Sandria Manly, M.D.     JML/MEDQ  D:  08/28/2006  T:  08/28/2006  Job:  [id number]   cc:   [facility]

## 2011-01-18 NOTE — Cardiovascular Report (Signed)
NAME:  [name], SPIEWAK              ACCOUNT NO.:  [id number]   MEDICAL RECORD NO.:  [id number]          PATIENT TYPE:  INP   LOCATION:  6527                         FACILITY:  [facility]   PHYSICIAN:  Richard A. Alanda Amass, M.D.DATE OF BIRTH:  [birth year]   DATE OF PROCEDURE:  06/14/2005  DATE OF DISCHARGE:                              CARDIAC CATHETERIZATION   PROCEDURE:  Retrograde central aortic catheterization, selective coronary  angiography pre and post IC nitroglycerin administration, left ventricular  angiogram in the RAO, LAO projection, subselective left internal mammary  artery, abdominal aortic angiogram midstream PA projection, Plavix load 600  mg plus aspirin 325 mg, weight-adjusted heparin 4000 units, intravascular  ultrasound interrogation left anterior descending and main left with a  Atlantis Pro SciMed Intravascular ultrasound device, Aggrastat double bolus  plus infusion, direct stenting high grade mid left anterior descending  stenosis soft plaque on intravascular ultrasound, Cypher DES Cordis 2.5/13  stent high-pressure inflation 20 atmospheres.   BRIEF HISTORY:  [name] is an extremely pleasant 75 year old  African-American married mother of three, grandmother of five who was a  nonsmoker.  She is a retired Tourist information centre manager and there was Dr.  Lenise Herald and his brother Radiographer, therapeutic.  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  She is now retired  but volunteers in high school.  She is a nonsmoker, has a history of  hypertension, hyperlipidemia, mild pulmonary hypertension and remote normal  coronary arteries in 1998.  She has a history of exogenous obesity and  significant hyperlipidemia.  She has been intolerant to statins in the past  including Zocor with calf cramps.  She has had trouble with ACE and ARBs.  No definite allergies but she has had apparent lightheadedness and weakness  with these.  She  also has seasonal allergies.  She has a history of GERD but  was in this. She has a history of GERD but was admitted on June 13, 2005,  with recurrent episodes of substernal chest pressure with radiation to the  jaw that was different than her GERD symptoms.  She had a previous negative  Cardiolite April 2006.  There is also a family history of coronary disease,  a brother who died at 26, father died at 69 and mother died at 69 of a CVA.  She was referred for cardiac catheterization.  As mentioned in the past had  lightheadedness and imbalance with Diovan and weakness with Aceon.   Myocardial infarction was ruled out by serial enzymes and EKGs.  Renal  function was normal and fasting lipid profile showed LDL of 177, total  cholesterol of 260.   DESCRIPTION OF PROCEDURE:  Informed consent was obtained to proceed with  diagnostic angiography and possible PCI. She was brought to second floor CP  lab in the post absorptive state after 5 mg Valium p.o. premedication and  preoperative hydration.  Right groin is prepped, draped in the usual manner.  1% Xylocaine was used for local anesthesia and the CFRA was entered with  single anterior puncture using modified Seldinger technique with a 18 thin-  wall needle and a 6-French short Daig sidearm sheath was inserted without  difficulty. During the procedure, she was given 200 mg of Versed IV in  divided doses for sedation,  Lopressor 5 mg IV for relative sinus  tachycardia, IC nitroglycerin 200 mcg x2 during the diagnostic and  interventional procedure.  Coronary angiography was performed with 6-French  4 cm taper, preformed coronary and pigtail catheters in multiple projections  followed by hand injection of the left subclavian which demonstrated a  widely patent LSCA, antegrade vertebral flow with irregularities of the  proximal left FCA with no significant stenosis.   LV angiogram in the RAO and LAO projection was done at 20 mL 12 mL per   second with injection.  Pullback pressure in the CA showed no gradient  across the aortic valve. The abdominal aortic angiogram was done in the  midstream PA projection at 20 mL, 20 cc per second, sewing single renal  arteries bilaterally.  There was 30% suggestion of very mild 20-30% right  renal artery narrowing with no significant stenosis single renal arteries  bilaterally but it was irregularity of the infrarenal abdominal aorta with  mild atherosclerosis with no aneurysm or stenosis. The common and external  iliacs were intact with no significant stenosis. The hypogastrics were  present, and there appeared to be good runoff on limited injection.   LV angiogram demonstrated a vigorously contracting left ventricle with  moderately severe LVH.  No mitral regurgitation and EF greater than 60%.  There are no wall motion abnormalities.   Fluoroscopy did not she show any significant coronary or intracardiac or  valvular calcification.   The main left coronary appeared to have 20-30% narrowing in the ostial and  proximal portion with some mild catheter damping.  The ostial LAD had smooth  40-50% narrowing with good flow.  The proximal LAD had lumpy bumpy 40% to  30% narrowing across the moderately large first diagonal.  There was then a  bend at the junction of the proximal and mid-LAD with systolic kinking. At  the level of the second diagonal and just after this there appeared to be a  high-grade focal 90% stenosis angiographically on review of multiple views.  The remainder of the LAD was widely patent, large, coursed to the apex of  the heart and bifurcated. There was normal TIMI III flow.   The first diagonal arose between the first two septal perforators,  trifurcated and was moderately large with no significant stenosis.   The second diagonal arose from the mid-LAD after the mid bend and just  proximal to the mid LAD stenosis and had no significant stenosis.  The circumflex  artery had smooth 50% proximal narrowing with good flow.  There was a large bifurcating marginal branch that was normal followed by a  PABG branch and a large distal circumflex that was widely patent and smooth  with no significant stenosis.   The right coronary was a dominant vessel giving off a normal PLA and PDA  distally. There was, however, ostial and proximal narrowing of approximately  40%.  It was difficult to say whether there was associated ostial spasm  accounting for some of this. They did appear to be good flow with no high-  grade stenosis. There was a large conus and a large RV branch just beyond  the ostia and a moderate size RB branch from the mid RCA. There was an  eccentric 30% lesion of the mid RCA.   DISCUSSION:  This patient has multiple risk factors, history compatible with  ischemia despite a normal Cardiolite April 2006, and significant  hyperlipidemia with a strong family history.   Angiographically this is quite difficult. There was some ostial main left  damping of the catheter and there is a focal lesion angiographically at the  diagonal II, possibly in an area of myocardial bridging but this does appear  to be atherosclerotic angiographically but not entirely clear.   It was felt best to proceed with IVUS interrogation in this setting to  assess her LAD and main left.   The patient tolerated the diagnostic procedure well.   She was given heparin 4000 units.  ACTs were monitored and therapeutic.  The  left coronary was intubated with a 6 [place] JL-4 SciMed guiding catheter,  and the LAD was traversed with an Asahi 0.014-inch soft wire, which was  positioned in the distal LAD.  An Atlantis Pro SciMed IVUS was then used to  interrogate the LAD from the distal third down to across the main left.   At the area of the second diagonal and the mid-LAD there was high-grade  stenosis with only IVUS artifact visualized and appeared to be predominantly  soft  concentric plaque with a reference lumen diameter of 2.8 mm.   Throughout the proximal third of the LAD segmentally, there was moderate  eccentric soft plaque with a residual lumen of 2.5 x 2.6 mm.   The ostial LAD had eccentric soft plaque with a residual lumen of 2.8 x 2.4  mm and a cross-sectional area of 5.6 mm square.  The reference vessel  diameter was 4.1 mm.   The main left ostial had eccentric plaque but excellent residual lumen of  4.0 x 3.4 cm and no significant obstruction noted.   It was elected to proceed with PCI of the mid-LAD lesion and medical therapy  of her proximal LAD disease, ostial LAD (48% narrowing by IVUS and  significant main left ostial disease).   The patient was given double bolus Aggrastat plus infusion, 600 mg of  Plavix, 325 mg of aspirin.  ACTs were monitored.  After an adequate waiting  for Aggrastat IIB/3A inhibitor therapy (approximately 1/2 hour), the lesion was direct stented with a 2.5/15  Cypher stent which was dilated, which was  positioned fluoroscopically and deployed at 14-25 and post dilated 20- 20  atmospheres at 30 seconds.  The balloon was pulled back and final injections  showed excellent angiographic result with stenosis reduction from 90 to 0%  with good stent deployment, no residual stenosis, good TIMI-3 flow to the  distal vessel.  Dilatation system was removed.  The sheath was flushed.  Final ACT was 247 seconds.  The sheath was secured to the skin to prevent  migration.  The patient brought to the holding area for postoperative care  in stable condition.   The initial blood pressures were 160-180/85 mmHg.  LV 160-180/0.  LVEDP 18-  20 mmHg with no gradient across the aortic valve.   As mentioned above, I recommend continued medical therapy.  Hopefully a  trial of non PY-450 statins will be tolerated.  Weight reduction and  exercise would be helpful and possibly additional trial of Zetia again for  hyperlipidemia.  We will try  to add low-dose beta blockers to her regimen  for her coronary disease and hypertension as well.  We also will try again  with low-dose ARB for coronary disease and hypertension.  She is referred  back to her primary cardiologist, Dr. Elsie Lincoln, for follow-up care and  medication adjustment.   CATHETERIZATION DIAGNOSES:  1.  Unstable angina.  2.  High grade mid left anterior descending stenosis, confirmed by      intravascular ultrasound, soft plaque DES direct stenting 2.5/13 Cypher.  3.  Residual mild segmental proximal left anterior descending diffuse      atherosclerosis 30-40%; residual 40-50%, ostial smooth, left anterior      descending narrowing; noncritical less than 20% ostial main left      eccentric plaque narrowing; 40% ostial and proximal right coronary      artery narrowing.  4.  Systemic hypertension, hypertensive heart disease, essentially normal      renal arteries, left ventricular hypertrophy, normal sinus rhythm.  5.  Past history of mild pulmonary hypertension.  6.  Exogenous obesity.  7.  Hyperlipidemia.  8.  History of gastroesophageal reflux disease.  9.  Nonsmoker.  Family history of coronary artery disease.      Richard A. Alanda Amass, M.D.  Electronically Signed     RAW/MEDQ  D:  06/14/2005  T:  06/15/2005  Job:  [id number]   cc:   Merlene Laughter. Renae Gloss, M.D.  Fax: [phone]   Madaline Savage, M.D.  Fax: [phone]   [facility]   Darlin Priestly, MD  Fax: [phone]

## 2011-01-18 NOTE — Op Note (Signed)
[name], [name]       ACCOUNT NO.:  [id number]   MEDICAL RECORD NO.:  [id number]          PATIENT TYPE:  AMB   LOCATION:  ENDO                         FACILITY:  [facility]   PHYSICIAN:  Anselmo Rod, M.D.  DATE OF BIRTH:  12/31/1935   DATE OF PROCEDURE:  07/23/2004  DATE OF DISCHARGE:                                 OPERATIVE REPORT   PROCEDURE PERFORMED:  Screening colonoscopy.   ENDOSCOPIST:  Anselmo Rod, M.D.   INSTRUMENT USED:  Olympus video colonoscope.   INDICATION FOR PROCEDURE:  A 75 year old African-American female with a  history of left lower quadrant pain, undergoing a screening colonoscopy to  rule out colonic polyps, masses, etc.   PREPROCEDURE PREPARATION:  Informed consent was procured from the patient.  The patient had fasted for eight hours prior to the procedure and prepped  with a bottle of magnesium citrate and a gallon of GoLYTELY the night prior  to the procedure.  A 10% rate of missed polyps and cancers and other risks  and benefits of the procedure were discussed with her in great detail,  including bleeding, perforation, etc.   PREPROCEDURE PHYSICAL:  VITAL SIGNS:  The patient had stable vital signs.  NECK:  Supple.  CHEST:  Clear to auscultation.  S1, S2 regular.  ABDOMEN:  Soft with normal bowel sounds.   DESCRIPTION OF PROCEDURE:  The patient was placed in the left lateral  decubitus position and sedated with 100 mg of Demerol and 10 mg of Versed in  slow incremental doses.  Once the patient was adequately sedate and  maintained on low-flow oxygen and continuous cardiac monitoring, the Olympus  video colonoscope was advanced from the rectum to the cecum.  There was some  residual stool in the colon.  Multiple washes were done.  Small internal  hemorrhoids were seen on retroflexion.  A few early sigmoid diverticula were  seen.  No other masses or polyps were identified.  The appendiceal orifice  and the ileocecal valve were clearly  visualized and photographed.  The  terminal ileum appeared normal and without lesions.  Retroflexion in the  rectum revealed small internal hemorrhoids.   IMPRESSION:  Normal colonoscopy up to the terminal ileum except for small  internal hemorrhoids and a few early scattered sigmoid diverticula.   RECOMMENDATIONS:  1.  Continue a high-fiber diet with liberal fluid intake.  2.  Repeat colonoscopy in the next five years unless the patient develops      any abnormal symptoms in the interim.  3.  Brochures on diverticulosis have been given to the patient for her      education.  Further recommendations will be made in follow-up as the      need arises in the future.       JNM/MEDQ  D:  07/23/2004  T:  07/23/2004  Job:  [id number]   cc:   Merlene Laughter. Renae Gloss, M.D.  [address]  [address]  [place]  [place] [address]  Fax: [phone]

## 2011-01-18 NOTE — Op Note (Signed)
NAMEJEROLYN, FLENNIKEN              ACCOUNT NO.:  [id number]   MEDICAL RECORD NO.:  [id number]          PATIENT TYPE:  INP   LOCATION:  3706                         FACILITY:  [facility]   PHYSICIAN:  Anselmo Rod, M.D.  DATE OF BIRTH:  10/30/1935   DATE OF PROCEDURE:  06/28/2005  DATE OF DISCHARGE:  06/28/2005                                 OPERATIVE REPORT   PROCEDURE PERFORMED:  Esophagogastroduodenoscopy.   ENDOSCOPIST:  Charna Elizabeth, M.D.   INSTRUMENT USED:  Olympus video panendoscope.   INDICATIONS FOR PROCEDURE:  A 75 year old African-American female with a  history of epigastric right upper quadrant pain and chest pain with recent  Cypher stent placement of the LAD to rule out peptic ulcer disease,  esophagitis, gastritis, etc.   PREPROCEDURE PREPARATION:  Informed consent was procured from the patient.  The patient was fasted for 8 hours prior to the procedure.   PREPROCEDURE PHYSICAL:  VITAL SIGNS:  The patient had stable vital signs.  NECK:  Supple.  CHEST:  Clear to auscultation. S1 and S2 regular.  ABDOMEN:  Soft with normal bowel sounds.   DESCRIPTION OF PROCEDURE:  The patient was placed in left lateral decubitus  position and sedated with 75 mg of Demerol and 6 mg of Versed in slow  incremental doses. Once the patient was adequately sedated and maintained on  low flow oxygen and continuous cardiac monitoring, the Olympus videoscope  was advanced through the mouth piece, over the tongue, into the esophagus  under direct vision. The entire esophagus appeared normal with no evidence  of ring, stricture, mass, esophagitis, or Barrett's mucosa. The scope was  then advanced into the stomach. Large hiatal hernia was seen on high  retroflexion. The rest of the gastric mucosa and proximal small bowel  appeared. No ulcers, erosions, masses, or polyps were identified. The  patient tolerated the procedure well without immediate complications.   IMPRESSION:  Large hiatal  hernia. Otherwise normal  esophagogastroduodenoscopy.   RECOMMENDATIONS:  1.  Proceed with an abdominal ultrasound and HIDA scan to rule out      gallbladder disease. This can be done on an outpatient basis.  2.  Low fat diet for now.  3.  Discharge the patient on PPI.  4.  Decrease aspirin to 81 mg per day.  5.  Further recommendations made after the above-mentioned test results have      been procured.      Anselmo Rod, M.D.  Electronically Signed     JNM/MEDQ  D:  06/28/2005  T:  06/29/2005  Job:  [id number]   cc:   Merlene Laughter. Renae Gloss, M.D.  Fax: [phone]

## 2011-01-18 NOTE — Discharge Summary (Signed)
NAMEBRYLINN, [name]              ACCOUNT NO.:  [id number]   MEDICAL RECORD NO.:  [id number]          PATIENT TYPE:  INP   LOCATION:  3706                         FACILITY:  [facility]   PHYSICIAN:  Madaline Savage, M.D.DATE OF BIRTH:  [birth year]   DATE OF ADMISSION:  06/27/2005  DATE OF DISCHARGE:  06/28/2005                                 DISCHARGE SUMMARY   DISCHARGE DIAGNOSES:  1.  Chest pain, probably noncardiac.  2.  Coronary disease, recent left anterior descending stenting June 15, 2005.  3.  Hypertension with left ventricular hypertrophy.  4.  Dyslipidemia.  5.  Hiatal hernia by endoscopy this admission.   HOSPITAL COURSE:  The patient is a 75 year old female, who was discharged  June 16, 2005 after a LAD CYPHER stent for a 90% LAD lesion.  She  presented with unstable angina and had negative enzymes.  She did well after  intervention.  She did have an abdominal CT scan done because of some  abdominal pain post procedure.  This was unremarkable for a hematoma.  She  was readmitted June 27, 2005 after having another episode of pain.  She  describes epigastric grabbing pain which radiates up into her throat.  Symptoms are better after she drinks water.  Enzymes are negative.  Recent  amylase and lipase were normal during her last admission.  She was admitted  to telemetry.  MI was ruled out.  She had an endoscopy by Dr. Loreta [name] on  June 27, 2005 which showed a large hiatal hernia, but otherwise  unremarkable.  She will have an outpatient HIDA scan and gallbladder  ultrasound.  She will follow up with Dr. Loreta [name] after this.  She has an  appointment to see Dr. Elsie [name] on November 2 and will keep this.   DISCHARGE MEDICATIONS:  1.  Toprol-XL 25 mg a day.  2.  Plavix 75 mg a day.  3.  Diovan 40 mg a day.  4.  Aspirin 81 mg a day.  5.  Cardizem CD 120 daily.  6.  Nexium 40 mg a day.  7.  Lasix 40 mg a day.   She has been told to avoid Mobic and other nonsteroidal  anti-inflammatories  unless absolutely needed.   LABORATORIES:  White count 6.3, hemoglobin 12.5, hematocrit 38.3, platelets  258,000.  Sodium 139, potassium 4.1, BUN 19, creatinine 0.9.  LFTs are  normal.  Troponins are negative x2.  Urinalysis is unremarkable.  INR is 1.   DISPOSITION:  Patient is discharged in stable condition and will follow up  with Dr. Loreta [name] and Dr. Elsie [name].      Abelino Derrick, P.A.    ______________________________  Madaline Savage, M.D.    Lenard Lance  D:  06/28/2005  T:  06/29/2005  Job:  [id number]   cc:   Merlene Laughter. Renae Gloss, M.D.  Fax: [phone]   DUKGUR KYH CWCB, M.D.  Fax: [phone]

## 2011-01-18 NOTE — H&P (Signed)
NAMEAMEENA, VESEY              ACCOUNT NO.:  [id number]   MEDICAL RECORD NO.:  [id number]          PATIENT TYPE:  EMS   LOCATION:  MAJO                         FACILITY:  [facility]   PHYSICIAN:  Madaline Savage, M.D.DATE OF BIRTH:  09/02/1936   DATE OF ADMISSION:  06/27/2005  DATE OF DISCHARGE:                                HISTORY & PHYSICAL   CHIEF COMPLAINT:  Chest pain.   HISTORY OF PRESENT ILLNESS:  Mrs. [name] is a 75 year old female who was  just discharged June 16, 2005, after presenting with chest pain. She had  had a catheterization during that admission and an LAD stent for a 90% mid  LAD lesion. She apparently has a remote history of normal coronaries in the  past. Enzymes were negative during that admission. She was discharged and  then is readmitted now through the emergency room after she called  complaining of recurrent chest pain which was similar to her preangioplasty  symptoms. She described epigastric grabbing pain that radiates up into her  throat. Her symptoms are improved after she drinks water and her symptoms  are aggravated by exercise and eating. She is now seen in the emergency  room. EKG done in the office showed no acute changes.   PAST MEDICAL HISTORY:  Remarkable for hypertension with LVH. She had normal  renal arteries at the time of her catheterization. She has dyslipidemia and  had an LDL of 184 during her last admission. She has some gastroesophageal  reflux and had a small hiatal hernia noted on abdominal CT that was done  post catheterization last admission for some abdominal pain prior to  discharge.   CURRENT MEDICATIONS:  1.  Toprol-XL 25 mg a day.  2.  Lasix 40 mg a day.  3.  Mobic 7.5 mg a day.  4.  Nexium 40 mg a day.  5.  Cardizem 120 a day.  6.  Diovan 40 mg a day.  7.  Aspirin 81 mg a day.  8.  Plavix 75 mg a day.  9.  Carafate 1 g a day.   She has no known drug allergies but apparently is intolerant to STATINs, ACE  INHIBITORS, and HIGH-DOSE ARBs.   SOCIAL HISTORY:  She is married. She has three children, five grandchildren.  She never smoked. 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   FAMILY HISTORY:  Remarkable that her father lived to 89. He apparently died  of an MI. Her mother died of a stroke in her last 65s.   REVIEW OF SYSTEMS:  Remarkable for previous colonoscopy done by Dr. Anselmo Rod. Review of systems otherwise complete and unremarkable except for  noted above.   PHYSICAL EXAMINATION:  VITAL SIGNS:  Blood pressure 145/92, pulse 76,  temperature 98.  GENERAL:  She is a well-developed, overweight African-American female in no  acute distress.  HEENT:  Normocephalic. Extraocular movements are intact. Sclerae are  nonicteric. Lids and conjunctivae are within normal limits.  NECK:  Without bruit and without JVD.  CHEST:  Clear to auscultation and percussion.  CARDIAC:  Reveals respiratory rate without murmur, rub, or gallop. Normal  S1, S2.  ABDOMEN:  Nontender. No hepatosplenomegaly is appreciated.  EXTREMITIES:  Without edema. Right groin is without hematoma. Pulses are  3+/4.  NEUROLOGIC:  Grossly intact. She is awake, alert, oriented, and cooperative,  moves all extremities without obvious deficits.  SKIN:  Warm and dry.   EKG show sinus rhythm without acute changes.   IMPRESSION:  1.  Chest pain, question cardiac versus esophageal spasm.  2.  Coronary disease, status post recent left anterior descending coronary      artery Cypher stenting on June 14, 2005.  3.  History of hypertension with left ventricular hypertrophy.  4.  Dyslipidemia with a history of statin intolerance.   PLAN:  The patient will be admitted to rule out MI. She may need further GI  evaluation and possibly a Cardiolite.      Abelino Derrick, P.A.    ______________________________  Madaline Savage, M.D.    Lenard Lance  D:  06/27/2005  T:  06/27/2005  Job:   [id number]

## 2011-01-18 NOTE — Discharge Summary (Signed)
NAMEBRAYLEE, LAL              ACCOUNT NO.:  [id number]   MEDICAL RECORD NO.:  [id number]          PATIENT TYPE:  INP   LOCATION:  3737                         FACILITY:  [facility]   PHYSICIAN:  Nanetta Batty, M.D.   DATE OF BIRTH:  1935-12-14   DATE OF ADMISSION:  06/13/2005  DATE OF DISCHARGE:  06/16/2005                                 DISCHARGE SUMMARY   DISCHARGE DIAGNOSES:  1.  Unstable angina.  2.  Coronary artery disease, status post left anterior descending stenting      this admission with drug-alluding stent with residual mild segmental      proximal left anterior descending.  artery sclerosed to 30-40% residual      from 40-50%, ostial left anterior descending narrowing, noncritical less      than 20% ostial left main eccentric plaque narrowing, and 40% ostial and      proximal right coronary artery narrowing.  3.  Systemic hypertension and hypertensive heart disease.  4.  Essentially normal renal arteries.  5.  Left ventricular hypertrophy by catheterization.  6.  Past history of mild pulmonary hypertension.  7.  Hyperlipidemia.  8.  Gastroesophageal reflux disease.  9.  Exogenous obesity.  10. Family history of coronary artery disease.   HISTORY OF PRESENT ILLNESS AND HOSPITAL COURSE:  This is a 74 year old  African-American female with prior history of patent coronary arteries by a  catheterization in 1998 with minimally elevated right heart pressure.  The  patient presented to the emergency room with complaints of chest pain, three  episodes a day over the last several days prior to her presentation.  She  increased her Nexium to b.i.d. and took nitroglycerin sublingual by EMS with  improvement of her symptoms, but later discomfort returned.  This is a very  sick patient on admission, and the decision was to admit her to [facility] and rule out MI and schedule for a catheterization the next day.   HOSPITAL PROCEDURES:  Cardiac catheterization, coronary  angiography  performed by Richard A. Alanda Amass, M.D. which revealed high-grade lesion of  the LAD, and he implanted a drug-alluding stent in the mid-LAD.  For a full  description of the procedure, please see dictated cardiac catheterization  note.   The patient tolerated the procedure well.  She did not develop any  complications.  The next morning, the groin was free of pain, but she  complained of lower abdominal pain.  A CT of the abdomen and pelvis was  ordered, and the abdomen did not reveal any retroperitoneal hemorrhage and  no acute abnormality except sliding hiatal hernia.   On the morning of her discharge, June 16, 2005, she was seen by Dr. Allyson Sabal  again, ambulated in the unit without difficulties.  Her groin site was  stable, and she was deemed to be stable for discharge home with outpatient  followup in two weeks.   HOSPITAL LABORATORY STUDIES:  A CBC showed hemoglobin 12.4, hematocrit 37.2,  white blood cell count 6.7, platelets 210,000.  A BMP revealed sodium 134,  potassium 3.7, chloride 102, CO2 28, BUN  13, creatinine 1.1, and glucose  102.  Hemoglobin A1c was 6.1.  Homocysteine 6.79, normal range.  Cardiac  panel negative x2.  Amylase was 97.  Erythrocyte sedimentation rate 12.  Lipase 18.  Fasting lipid profile revealed total cholesterol 247,  triglyceride 57, HDL 52, LDL 184.   DISCHARGE MEDICATIONS:  1.  Lasix 40 daily.  2.  Carafate 1 g daily.  3.  Mobic 7.5 mg daily.  4.  Nexium 40 mg daily.  5.  Cardizem 120 mg daily.  6.  Toprol XL 25 mg daily.  7.  Diovan 40 mg daily.  8.  Plavix 75 mg daily.  9.  Aspirin 81 mg daily.   DISCHARGE ACTIVITIES:  Avoid lifting greater than five pounds, avoid driving  and strenuous activity three days post catheterization, allowed to shower,  to avoid bathing.   DISCHARGE DIET:  Low-fat, low-cholesterol diet.   DISCHARGE FOLLOW UP:  The office will set up an appointment to see Dr.  Elsie Lincoln in two weeks and will notify  the patient.   The patient is allergic to STATINS, and that why the therapy with statins  was not initiated.      Raymon Mutton, P.A.      Nanetta Batty, M.D.  Electronically Signed    MK/MEDQ  D:  06/16/2005  T:  06/16/2005  Job:  [id number]   cc:   Merlene Laughter. Renae Gloss, M.D.  Fax: [phone]   Madaline Savage, M.D.  Fax: [phone]

## 2011-01-27 ENCOUNTER — Emergency Department (HOSPITAL_COMMUNITY)
Admission: EM | Admit: 2011-01-27 | Discharge: 2011-01-27 | Disposition: A | Discharge status: Home / Self Care | Payer: Medicare Other | Attending: Emergency Medicine | Admitting: Emergency Medicine

## 2011-01-27 ENCOUNTER — Emergency Department (HOSPITAL_COMMUNITY): Payer: Medicare Other

## 2011-01-27 DIAGNOSIS — K589 Irritable bowel syndrome without diarrhea: Secondary | ICD-10-CM | POA: Insufficient documentation

## 2011-01-27 DIAGNOSIS — R109 Unspecified abdominal pain: Secondary | ICD-10-CM | POA: Insufficient documentation

## 2011-01-27 DIAGNOSIS — I2510 Atherosclerotic heart disease of native coronary artery without angina pectoris: Secondary | ICD-10-CM | POA: Insufficient documentation

## 2011-01-27 DIAGNOSIS — E785 Hyperlipidemia, unspecified: Secondary | ICD-10-CM | POA: Insufficient documentation

## 2011-01-27 DIAGNOSIS — I10 Essential (primary) hypertension: Secondary | ICD-10-CM | POA: Insufficient documentation

## 2011-01-27 DIAGNOSIS — K5732 Diverticulitis of large intestine without perforation or abscess without bleeding: Secondary | ICD-10-CM | POA: Insufficient documentation

## 2011-01-27 LAB — URINALYSIS, ROUTINE W REFLEX MICROSCOPIC
Bilirubin Urine: NEGATIVE
Glucose, UA: NEGATIVE mg/dL
Ketones, ur: NEGATIVE mg/dL
Nitrite: NEGATIVE
pH: 6.5 (ref 5.0–8.0)

## 2011-01-27 LAB — CBC
HCT: 38.2 % (ref 36.0–46.0)
Hemoglobin: 12.2 g/dL (ref 12.0–15.0)
WBC: 6.4 10*3/uL (ref 4.0–10.5)

## 2011-01-27 LAB — BASIC METABOLIC PANEL
GFR calc non Af Amer: >60 mL/min (ref >60)
Potassium: 3.6 mEq/L (ref 3.5–5.1)
Sodium: 139 mEq/L (ref 135–145)

## 2011-01-27 LAB — HEPATIC FUNCTION PANEL
AST: 30 U/L (ref 0–37)
Albumin: 3.6 g/dL (ref 3.5–5.2)
Alkaline Phosphatase: 82 U/L (ref 39–117)
Total Bilirubin: 0.1 mg/dL — ABNORMAL LOW (ref 0.3–1.2)

## 2011-01-27 LAB — URINE MICROSCOPIC-ADD ON

## 2011-01-27 MED ORDER — IOHEXOL 300 MG/ML  SOLN: 100.0000 mL | Freq: Once | INTRAMUSCULAR | Status: DC | PRN

## 2011-01-28 LAB — URINE CULTURE
Colony Count: 35000
Culture  Setup Time: 201205271736

## 2011-04-24 ENCOUNTER — Inpatient Hospital Stay (HOSPITAL_COMMUNITY)
Admission: EM | Admit: 2011-04-24 | Discharge: 2011-04-26 | DRG: 310 | Disposition: A | Discharge status: Home / Self Care | Payer: Medicare Other | Source: Ambulatory Visit | Attending: Interventional Cardiology | Admitting: Interventional Cardiology

## 2011-04-24 ENCOUNTER — Emergency Department (HOSPITAL_COMMUNITY): Payer: Medicare Other

## 2011-04-24 DIAGNOSIS — I4891 Unspecified atrial fibrillation: Principal | ICD-10-CM | POA: Diagnosis present

## 2011-04-24 DIAGNOSIS — IMO0001 Reserved for inherently not codable concepts without codable children: Secondary | ICD-10-CM | POA: Diagnosis present

## 2011-04-24 DIAGNOSIS — E785 Hyperlipidemia, unspecified: Secondary | ICD-10-CM | POA: Diagnosis present

## 2011-04-24 DIAGNOSIS — F411 Generalized anxiety disorder: Secondary | ICD-10-CM | POA: Diagnosis present

## 2011-04-24 DIAGNOSIS — Z7982 Long term (current) use of aspirin: Secondary | ICD-10-CM

## 2011-04-24 DIAGNOSIS — I4892 Unspecified atrial flutter: Secondary | ICD-10-CM | POA: Diagnosis present

## 2011-04-24 DIAGNOSIS — R002 Palpitations: Secondary | ICD-10-CM

## 2011-04-24 DIAGNOSIS — I10 Essential (primary) hypertension: Secondary | ICD-10-CM | POA: Diagnosis present

## 2011-04-24 DIAGNOSIS — R079 Chest pain, unspecified: Secondary | ICD-10-CM

## 2011-04-24 DIAGNOSIS — Z79899 Other long term (current) drug therapy: Secondary | ICD-10-CM

## 2011-04-24 DIAGNOSIS — E876 Hypokalemia: Secondary | ICD-10-CM | POA: Diagnosis present

## 2011-04-24 DIAGNOSIS — Z9861 Coronary angioplasty status: Secondary | ICD-10-CM

## 2011-04-24 DIAGNOSIS — I2510 Atherosclerotic heart disease of native coronary artery without angina pectoris: Secondary | ICD-10-CM | POA: Diagnosis present

## 2011-04-24 LAB — DIFFERENTIAL
Basophils Relative: 0 % (ref 0–1)
Eosinophils Absolute: 0.1 10*3/uL (ref 0.0–0.7)
Eosinophils Relative: 1 % (ref 0–5)
Monocytes Absolute: 0.4 10*3/uL (ref 0.1–1.0)
Monocytes Relative: 6 % (ref 3–12)
Neutrophils Relative %: 58 % (ref 43–77)

## 2011-04-24 LAB — COMPREHENSIVE METABOLIC PANEL
AST: 18 U/L (ref 0–37)
Alkaline Phosphatase: 82 U/L (ref 39–117)
BUN: 14 mg/dL (ref 6–23)
CO2: 27 mEq/L (ref 19–32)
Chloride: 100 mEq/L (ref 96–112)
Creatinine, Ser: 0.74 mg/dL (ref 0.50–1.10)
GFR calc non Af Amer: >60 mL/min (ref >60)
Total Bilirubin: 0.1 mg/dL — ABNORMAL LOW (ref 0.3–1.2)

## 2011-04-24 LAB — CBC
MCH: 27.2 pg (ref 26.0–34.0)
MCHC: 33.5 g/dL (ref 30.0–36.0)
Platelets: 214 10*3/uL (ref 150–400)
RDW: 15 % (ref 11.5–15.5)

## 2011-04-24 LAB — POCT I-STAT TROPONIN I: Troponin i, poc: 0.01 ng/mL (ref 0.00–0.08)

## 2011-04-25 LAB — CARDIAC PANEL(CRET KIN+CKTOT+MB+TROPI)
CK, MB: 4.3 ng/mL — ABNORMAL HIGH (ref 0.3–4.0)
CK, MB: 4.5 ng/mL — ABNORMAL HIGH (ref 0.3–4.0)
Relative Index: 2.2 (ref 0.0–2.5)
Relative Index: 2.3 (ref 0.0–2.5)
Total CK: 198 U/L — ABNORMAL HIGH (ref 7–177)
Total CK: 198 U/L — ABNORMAL HIGH (ref 7–177)
Troponin I: <0.3 ng/mL
Troponin I: <0.3 ng/mL

## 2011-04-25 LAB — BASIC METABOLIC PANEL WITH GFR
BUN: 10 mg/dL (ref 6–23)
CO2: 28 meq/L (ref 19–32)
Calcium: 9.3 mg/dL (ref 8.4–10.5)
Chloride: 107 meq/L (ref 96–112)
Creatinine, Ser: 0.64 mg/dL (ref 0.50–1.10)
GFR calc Af Amer: >60 mL/min
GFR calc non Af Amer: >60 mL/min
Glucose, Bld: 117 mg/dL — ABNORMAL HIGH (ref 70–99)
Potassium: 3.9 meq/L (ref 3.5–5.1)
Sodium: 140 meq/L (ref 135–145)

## 2011-04-25 LAB — PRO B NATRIURETIC PEPTIDE: Pro B Natriuretic peptide (BNP): 316 pg/mL — ABNORMAL HIGH (ref 0–125)

## 2011-04-25 LAB — CK TOTAL AND CKMB (NOT AT ARMC)
CK, MB: 4.3 ng/mL — ABNORMAL HIGH (ref 0.3–4.0)
Relative Index: 2 (ref 0.0–2.5)
Total CK: 220 U/L — ABNORMAL HIGH (ref 7–177)

## 2011-04-25 LAB — MRSA PCR SCREENING: MRSA by PCR: NEGATIVE

## 2011-04-25 LAB — HEPARIN LEVEL (UNFRACTIONATED): Heparin Unfractionated: 0.55 [IU]/mL (ref 0.30–0.70)

## 2011-04-25 LAB — MAGNESIUM: Magnesium: 1.8 mg/dL (ref 1.5–2.5)

## 2011-04-26 LAB — CBC
HCT: 39.1 % (ref 36.0–46.0)
Hemoglobin: 12.6 g/dL (ref 12.0–15.0)
MCV: 82.8 fL (ref 78.0–100.0)
RBC: 4.72 MIL/uL (ref 3.87–5.11)
RDW: 15.5 % (ref 11.5–15.5)
WBC: 7.2 10*3/uL (ref 4.0–10.5)

## 2011-04-26 LAB — BASIC METABOLIC PANEL
BUN: 12 mg/dL (ref 6–23)
CO2: 30 mEq/L (ref 19–32)
Chloride: 109 mEq/L (ref 96–112)
Glucose, Bld: 98 mg/dL (ref 70–99)
Potassium: 3.6 mEq/L (ref 3.5–5.1)
Sodium: 144 mEq/L (ref 135–145)

## 2011-05-03 NOTE — Discharge Summary (Signed)
  NAMEMARITA, BURNSED NO.:  [id number]  MEDICAL RECORD NO.:  [id number]  LOCATION:  2016                         FACILITY:  [facility]  PHYSICIAN:  Lyn Records, M.D.   DATE OF BIRTH:  1936/03/19  DATE OF ADMISSION:  04/24/2011 DATE OF DISCHARGE:  04/26/2011                              DISCHARGE SUMMARY   REASON FOR ADMISSION:  Atrial fib.  DISCHARGE DIAGNOSES: 1. Atrial fibrillation resolving after intravenous amiodarone therapy. 2. Hypertension. 3. Coronary artery disease with prior left anterior descending stent     2006, and negative nuclear study for ischemia last year. 4. Hyperlipidemia.  PLAN: 1. Continue aspirin 325 mg per day.  [place] score is 1. 2. Other medications include:  Metoprolol 25 mg p.o. b.i.d.,     nitroglycerin 0.4 mg sublingually p.r.n. if chest pain, albuterol 1     puff as needed if shortness of breath, alprazolam 0.25 mg three     times per day as needed, aspirin 81 mg per day, Claritin 10 mg per     day, Diovan 160 mg per day, gabapentin 300 mg per day, Lyrica has     been discontinued, multiple vitamins one per day, potassium     chloride 20 mEq per day, WelChol 3.75 g 1 packet daily.  ACTIVITIES:  As tolerated.  Office visit with Dr. Verdis Prime in 1-2 weeks for stress nuclear study to rule out ischemia in the anterior wall territory.  HISTORY AND PHYSICAL AND HOSPITAL COURSE:  Please see the admitting history and physical.  The patient had spontaneous reversion of atrial fibrillation assisted by couple doses of IV amiodarone.  She was started on IV heparin on admission.  Atrial fibrillation had been present for only hours at presentation.  She has never before had atrial fibrillation.  Her echocardiogram was unremarkable.  No regional wall motion abnormalities were noted.  The EKG demonstrated anterior T-wave inversion after conversion.  With the patient's known history of LAD stent, we will perform an outpatient nuclear  study since  she is asymptomatic.     Lyn Records, M.D.     HWS/MEDQ  D:  04/26/2011  T:  04/26/2011  Job:  [id number]  cc:   Merlene Laughter. Renae Gloss, M.D.  Electronically Signed by Verdis Prime M.D. on 05/03/2011 04:21:38 PM

## 2011-05-16 NOTE — H&P (Signed)
[name], [name] NO.:  [id number]  MEDICAL RECORD NO.:  [id number]  LOCATION:  [facility]                         FACILITY:  [facility]  PHYSICIAN:  Catherine Flor, MD   DATE OF BIRTH:  1935-10-18  DATE OF ADMISSION:  04/24/2011 DATE OF DISCHARGE:                             HISTORY & PHYSICAL   PRIMARY CARE PHYSICIAN:  Catherine Laughter. Renae Gloss, MD  CARDIOLOGIST:  Catherine Records, MD  ADMISSION DIAGNOSES:  Atrial arrhythmias with rapid ventricular response.  CHIEF COMPLAINT:  Chest pain, palpitations.  HISTORY OF PRESENT ILLNESS:  [name] is a 75 year old African American female with coronary artery disease and fibromyalgia, who presents to the ER tonight after acute onset of chest tightness, palpitations, tachycardia, and shortness of breath that began at 9:30 p.m.  She attributed her symptoms to the recent taking of WelChol this morning at 11 o'clock.  She states after she took her first dose of WelChol, she began to feel nauseated and had nausea and was all day long.  While sitting and watching TV tonight, she had acute onset of chest pain, shortness of breath, and palpitations.  Upon arrival to the emergency room, she was found to be in AFib with RVR and was treated with the diltiazem again with resolution of her symptoms.  She is currently feeling much better and her rate is better controlled at 115 beats per minute.  Before tonight, she was feeling relatively well, although she did see her PCP within the last 24 hours with her fibromyalgia pain and was treated with a steroid shot.  However, most of her symptoms began tonight at 9:30 when she began to have chest pain and palpitations.  She has not had a recent exertional chest pain or shortness of breath or orthopnea.  PAST MEDICAL HISTORY: 1. Coronary artery disease:  LAD PCI in 2006.  Left heart     catheterization in 2009, showed LVH, EF 60% and a 60% ostial RCA     lesion, but otherwise  nonobstructive disease.  Nuclear perfusion     study in June 2009, showed normal LV function and no perfusion     abnormality. 2. Fibromyalgia on chronic Lyrica. 3. Polypectomy. 4. GERD. 5. Diverticulosis. 6. Hypertension. 7. Hyperlipidemia. 8. Anxiety. 9. Previous laparotomies for small bowel obstruction due to adhesions. 10.Hiatal hernia.  ALLERGIES:  No known drug allergies.  HOME MEDICATIONS: 1. Sublingual nitroglycerin p.r.n. 2. Multivitamin half a tablet daily. 3. Aspirin 81 mg daily. 4. Claritin 10 mg daily. 5. WelChol 3.75 g packet daily, this was just started yesterday. 6. Albuterol p.r.n. inhaler. 7. Lyrica 50 mg t.i.d. 8. Potassium chloride 20 mEq daily. 9. Gabapentin 300 mg 4 times daily. 10.Diovan 160 mg daily. 11.Alprazolam 0.25 mg t.i.d. as needed for anxiety.  SOCIAL HISTORY:  [place]  She does not use alcohol or tobacco.  She is a retired Runner, broadcasting/film/video.  FAMILY HISTORY:  She has a mother who had a TIA.  Her father had heart disease, but not in early age.  There is also a family history of breast cancer.  REVIEW OF SYSTEMS:  A full review of systems is obtained  and negative except as noted in HPI.  PHYSICAL EXAMINATION:  VITAL SIGNS:  Blood pressure 160/82; initial heart rate 171, now 115; respiratory rate 20, temperature 98.3, O2 sats are normal on room air. GENERAL:  No acute distress. HEENT:  Extraocular movements intact.  Oropharynx benign.  Nonicteric sclerae. NECK:  Supple. CARDIOVASCULAR:  Irregular rhythm.  Normal S1 and S2 with a prominent PMI.  Jugular venous pressure is 8 cm. LUNGS:  There are mild basilar crackles. ABDOMEN:  Soft, nontender, nondistended. EXTREMITIES:  No edema.  Pulses are intact throughout. NEUROLOGIC:  Grossly afocal.  Moves all extremities well.  Cranial nerves are intact. SKIN:  No rashes. LYMPH:  No lymphadenopathy.  EKG shows an atypical atrial flutter with rapid  ventricular response and inferolateral ST depressions.  White count 7, hemoglobin 13.8, platelets 214, potassium is low at 3.1, BUN and creatinine are normal.  Point-of-care troponin is negative. Chest x-ray is relatively clear.  ASSESSMENT AND PLAN:  [name] is a 75 year old African American female with coronary artery disease, here with new-onset atypical atrial flutter with rapid ventricular response and inferolateral ST depressions.  Her symptoms are resolved now and her rate is better controlled. 1. Atypical atrial flutter:  She is currently on a diltiazem drip.  We     will add p.o. metoprolol with plans to transition to this given her     known coronary artery disease.  We will also place her on a heparin     drip with plans for chronic systemic anticoagulation.  We will     check a TSH.  We will also get a surface 2-D echocardiogram to rule     out structural heart disease.  Given her exam, I suspect she has     significant LVH.  We will tentatively plan for a cardioversion in     the morning given the acute onset of her symptoms, it is probably     reasonable to defer transesophageal echo and go ahead and proceed     with cardioversion.  She will need chronic systemic     anticoagulation. 2. Chest pain and EKG changes:  Her changes could be due to ischemia     in the setting of rapid ventricular     response or could be due to left ventricular hypertrophy.  We will     be checking an echocardiogram as above and follow her serial     cardiac enzymes.  She is already on aspirin and heparin.  She is     currently chest pain free. 3. Hypokalemia.  We will replace her potassium and check a magnesium.     Catherine Flor, MD     MMB/MEDQ  D:  04/25/2011  T:  04/25/2011  Job:  [id number]  cc:   [name], M.D. [name], M.D.  Electronically Signed by Catherine Score MD on 05/16/2011 08:08:09 PM

## 2011-05-30 LAB — CARDIAC PANEL(CRET KIN+CKTOT+MB+TROPI)
CK, MB: 1.6
CK, MB: 1.7
Total CK: 161
Total CK: 174

## 2011-05-30 LAB — CBC
HCT: 35.2 — ABNORMAL LOW
HCT: 35.9 — ABNORMAL LOW
HCT: 38
Hemoglobin: 12.1
Hemoglobin: 12.4
Hemoglobin: 12.4
MCHC: 32.5
MCHC: 32.7
MCHC: 32.9
MCHC: 33.5
MCHC: 33.7
MCV: 84.8
MCV: 85.1
MCV: 85.2
Platelets: 254
RBC: 4.47
RDW: 15
RDW: 15.1
RDW: 15.2
RDW: 15.2
RDW: 15.6 — ABNORMAL HIGH
WBC: 8.2

## 2011-05-30 LAB — BASIC METABOLIC PANEL
BUN: 10
BUN: 10
CO2: 29
CO2: 31
CO2: 33 — ABNORMAL HIGH
CO2: 28
Calcium: 8.9
Calcium: 9
Calcium: 9.1
Calcium: 9.2
Creatinine, Ser: 1.03
Creatinine, Ser: 1.13
GFR calc Af Amer: >60
GFR calc non Af Amer: 49 — ABNORMAL LOW
GFR calc non Af Amer: 53 — ABNORMAL LOW
Glucose, Bld: 105 — ABNORMAL HIGH
Glucose, Bld: 109 — ABNORMAL HIGH
Glucose, Bld: 92
Glucose, Bld: 109 — ABNORMAL HIGH
Potassium: 3.8
Sodium: 142
Sodium: 142
Sodium: 138

## 2011-05-30 LAB — POCT I-STAT, CHEM 8
BUN: 12
Calcium, Ion: 1.17
Chloride: 104
Glucose, Bld: 100 — ABNORMAL HIGH
HCT: 41
TCO2: 28

## 2011-05-30 LAB — PROTIME-INR
INR: 1.1
Prothrombin Time: 13

## 2011-05-30 LAB — APTT: aPTT: 99 — ABNORMAL HIGH

## 2011-05-30 LAB — D-DIMER, QUANTITATIVE: D-Dimer, Quant: 0.24

## 2011-05-30 LAB — LIPID PANEL
Triglycerides: 60
VLDL: 12

## 2011-05-30 LAB — HEPARIN LEVEL (UNFRACTIONATED)
Heparin Unfractionated: 0.51
Heparin Unfractionated: 0.77 — ABNORMAL HIGH
Heparin Unfractionated: 1 — ABNORMAL HIGH

## 2011-05-30 LAB — B-NATRIURETIC PEPTIDE (CONVERTED LAB): Pro B Natriuretic peptide (BNP): 70

## 2011-05-30 LAB — POCT CARDIAC MARKERS: Troponin i, poc: 0.05

## 2011-05-30 LAB — HEMOGLOBIN A1C: Mean Plasma Glucose: 143

## 2011-06-10 LAB — I-STAT 8, (EC8 V) (CONVERTED LAB)
Acid-Base Excess: 4 — ABNORMAL HIGH
Glucose, Bld: 85
Hemoglobin: 15
Potassium: 4
Sodium: 139
TCO2: 30

## 2011-06-10 LAB — POCT CARDIAC MARKERS
CKMB, poc: 1.5
Myoglobin, poc: 119
Operator id: 270111
Troponin i, poc: <0.05

## 2011-06-10 LAB — PROTIME-INR
INR: 0.9
Prothrombin Time: 12.6

## 2011-06-10 LAB — D-DIMER, QUANTITATIVE: D-Dimer, Quant: <0.22

## 2011-06-13 LAB — DIFFERENTIAL
Basophils Absolute: 0
Basophils Absolute: 0.1
Basophils Relative: 0
Eosinophils Relative: 0
Eosinophils Relative: 1
Lymphocytes Relative: 14
Lymphocytes Relative: 39
Monocytes Absolute: 0.4
Neutro Abs: 4.6
Neutro Abs: 6.8

## 2011-06-13 LAB — COMPREHENSIVE METABOLIC PANEL WITH GFR
Alkaline Phosphatase: 92
BUN: 12
Chloride: 100
Creatinine, Ser: 1.05
Glucose, Bld: 130 — ABNORMAL HIGH
Potassium: 3.8
Total Bilirubin: 0.5

## 2011-06-13 LAB — POCT CARDIAC MARKERS
CKMB, poc: 1.2
CKMB, poc: 1.4
Myoglobin, poc: 118
Myoglobin, poc: 131
Operator id: 270111
Troponin i, poc: <0.05

## 2011-06-13 LAB — I-STAT 8, (EC8 V) (CONVERTED LAB)
Acid-Base Excess: 7 — ABNORMAL HIGH
Chloride: 102
pCO2, Ven: 47
pH, Ven: 7.451 — ABNORMAL HIGH

## 2011-06-13 LAB — POCT I-STAT CREATININE
Creatinine, Ser: 1.2
Operator id: 272551

## 2011-06-13 LAB — URINALYSIS, ROUTINE W REFLEX MICROSCOPIC
Bilirubin Urine: NEGATIVE
Glucose, UA: NEGATIVE
Hgb urine dipstick: NEGATIVE
Ketones, ur: NEGATIVE
Nitrite: NEGATIVE
Protein, ur: NEGATIVE
Specific Gravity, Urine: 1.005
Urobilinogen, UA: 0.2
pH: 7

## 2011-06-13 LAB — CK TOTAL AND CKMB (NOT AT ARMC)
CK, MB: 2.3
Relative Index: 1.3
Total CK: 177

## 2011-06-13 LAB — CBC
HCT: 45.7
Hemoglobin: 14.9
MCV: 82.7
Platelets: 267
RBC: 5.53 — ABNORMAL HIGH
RDW: 15.6 — ABNORMAL HIGH
WBC: 8.4

## 2011-06-13 LAB — COMPREHENSIVE METABOLIC PANEL
ALT: 21
AST: 21
AST: 23
Albumin: 3.9
Alkaline Phosphatase: 96
BUN: 15
CO2: 29
CO2: 31
Calcium: 9.3
Chloride: 97
Creatinine, Ser: 1.01
GFR calc Af Amer: >60
GFR calc non Af Amer: 52 — ABNORMAL LOW
GFR calc non Af Amer: 54 — ABNORMAL LOW
Potassium: 3.6
Sodium: 139
Total Bilirubin: 0.8
Total Protein: 7.5

## 2011-06-13 LAB — TROPONIN I: Troponin I: 0.01

## 2011-06-13 LAB — D-DIMER, QUANTITATIVE: D-Dimer, Quant: 0.29

## 2011-06-13 LAB — PROTIME-INR
Prothrombin Time: 12.7
Prothrombin Time: 12.7

## 2011-06-13 LAB — LIPASE, BLOOD: Lipase: 18

## 2011-06-17 ENCOUNTER — Other Ambulatory Visit (HOSPITAL_COMMUNITY)
Admission: RE | Admit: 2011-06-17 | Discharge: 2011-06-17 | Disposition: A | Discharge status: Home / Self Care | Payer: Medicare Other | Source: Ambulatory Visit | Attending: Obstetrics and Gynecology | Admitting: Obstetrics and Gynecology

## 2011-06-17 ENCOUNTER — Other Ambulatory Visit: Payer: Self-pay | Admitting: Obstetrics and Gynecology

## 2011-06-17 DIAGNOSIS — Z01419 Encounter for gynecological examination (general) (routine) without abnormal findings: Secondary | ICD-10-CM | POA: Insufficient documentation

## 2011-07-06 ENCOUNTER — Other Ambulatory Visit: Payer: Self-pay

## 2011-07-06 ENCOUNTER — Inpatient Hospital Stay (HOSPITAL_COMMUNITY)
Admission: EM | Admit: 2011-07-06 | Discharge: 2011-07-09 | DRG: 305 | Disposition: A | Discharge status: Home / Self Care | Payer: Medicare Other | Source: Ambulatory Visit | Attending: Internal Medicine | Admitting: Internal Medicine

## 2011-07-06 DIAGNOSIS — I4891 Unspecified atrial fibrillation: Secondary | ICD-10-CM | POA: Diagnosis present

## 2011-07-06 DIAGNOSIS — K5730 Diverticulosis of large intestine without perforation or abscess without bleeding: Secondary | ICD-10-CM | POA: Diagnosis present

## 2011-07-06 DIAGNOSIS — F411 Generalized anxiety disorder: Secondary | ICD-10-CM | POA: Diagnosis present

## 2011-07-06 DIAGNOSIS — Z79899 Other long term (current) drug therapy: Secondary | ICD-10-CM

## 2011-07-06 DIAGNOSIS — H8110 Benign paroxysmal vertigo, unspecified ear: Secondary | ICD-10-CM | POA: Diagnosis present

## 2011-07-06 DIAGNOSIS — Z7982 Long term (current) use of aspirin: Secondary | ICD-10-CM

## 2011-07-06 DIAGNOSIS — Z860100 Personal history of colon polyps, unspecified: Secondary | ICD-10-CM

## 2011-07-06 DIAGNOSIS — K449 Diaphragmatic hernia without obstruction or gangrene: Secondary | ICD-10-CM | POA: Diagnosis present

## 2011-07-06 DIAGNOSIS — I2510 Atherosclerotic heart disease of native coronary artery without angina pectoris: Secondary | ICD-10-CM | POA: Diagnosis present

## 2011-07-06 DIAGNOSIS — R42 Dizziness and giddiness: Secondary | ICD-10-CM | POA: Diagnosis present

## 2011-07-06 DIAGNOSIS — I10 Essential (primary) hypertension: Secondary | ICD-10-CM | POA: Diagnosis present

## 2011-07-06 DIAGNOSIS — Z9861 Coronary angioplasty status: Secondary | ICD-10-CM

## 2011-07-06 DIAGNOSIS — IMO0001 Reserved for inherently not codable concepts without codable children: Secondary | ICD-10-CM | POA: Diagnosis present

## 2011-07-06 DIAGNOSIS — E785 Hyperlipidemia, unspecified: Secondary | ICD-10-CM | POA: Diagnosis present

## 2011-07-06 DIAGNOSIS — K219 Gastro-esophageal reflux disease without esophagitis: Secondary | ICD-10-CM | POA: Diagnosis present

## 2011-07-06 DIAGNOSIS — I25119 Atherosclerotic heart disease of native coronary artery with unspecified angina pectoris: Secondary | ICD-10-CM | POA: Diagnosis present

## 2011-07-06 DIAGNOSIS — R1030 Lower abdominal pain, unspecified: Secondary | ICD-10-CM | POA: Diagnosis present

## 2011-07-06 DIAGNOSIS — Z23 Encounter for immunization: Secondary | ICD-10-CM

## 2011-07-06 DIAGNOSIS — R55 Syncope and collapse: Secondary | ICD-10-CM | POA: Diagnosis present

## 2011-07-06 HISTORY — DX: Fibromyalgia: M79.7

## 2011-07-06 HISTORY — DX: Atherosclerotic heart disease of native coronary artery without angina pectoris: I25.10

## 2011-07-06 HISTORY — DX: Essential (primary) hypertension: I10

## 2011-07-06 HISTORY — DX: Anxiety disorder, unspecified: F41.9

## 2011-07-06 LAB — DIFFERENTIAL
Basophils Absolute: 0 10*3/uL (ref 0.0–0.1)
Eosinophils Absolute: 0.1 10*3/uL (ref 0.0–0.7)
Eosinophils Relative: 1 % (ref 0–5)
Lymphocytes Relative: 45 % (ref 12–46)
Monocytes Absolute: 0.3 10*3/uL (ref 0.1–1.0)

## 2011-07-06 LAB — POCT I-STAT, CHEM 8
Calcium, Ion: 1.17 mmol/L (ref 1.12–1.32)
Creatinine, Ser: 1.2 mg/dL — ABNORMAL HIGH (ref 0.50–1.10)
Glucose, Bld: 108 mg/dL — ABNORMAL HIGH (ref 70–99)
HCT: 48 % — ABNORMAL HIGH (ref 36.0–46.0)
Hemoglobin: 16.3 g/dL — ABNORMAL HIGH (ref 12.0–15.0)
Potassium: 3.4 mEq/L — ABNORMAL LOW (ref 3.5–5.1)
TCO2: 27 mmol/L (ref 0–100)

## 2011-07-06 LAB — CBC
HCT: 43.1 % (ref 36.0–46.0)
MCHC: 33.9 g/dL (ref 30.0–36.0)
Platelets: 213 10*3/uL (ref 150–400)
RDW: 15 % (ref 11.5–15.5)
WBC: 7.5 10*3/uL (ref 4.0–10.5)

## 2011-07-06 LAB — POCT I-STAT TROPONIN I

## 2011-07-07 ENCOUNTER — Emergency Department (HOSPITAL_COMMUNITY): Payer: Medicare Other

## 2011-07-07 ENCOUNTER — Observation Stay (HOSPITAL_COMMUNITY): Payer: Medicare Other

## 2011-07-07 DIAGNOSIS — I25119 Atherosclerotic heart disease of native coronary artery with unspecified angina pectoris: Secondary | ICD-10-CM | POA: Diagnosis present

## 2011-07-07 DIAGNOSIS — H8110 Benign paroxysmal vertigo, unspecified ear: Secondary | ICD-10-CM | POA: Diagnosis present

## 2011-07-07 DIAGNOSIS — I4891 Unspecified atrial fibrillation: Secondary | ICD-10-CM | POA: Diagnosis present

## 2011-07-07 DIAGNOSIS — R42 Dizziness and giddiness: Secondary | ICD-10-CM | POA: Diagnosis present

## 2011-07-07 DIAGNOSIS — I10 Essential (primary) hypertension: Secondary | ICD-10-CM | POA: Diagnosis present

## 2011-07-07 DIAGNOSIS — R1030 Lower abdominal pain, unspecified: Secondary | ICD-10-CM | POA: Diagnosis present

## 2011-07-07 LAB — CARDIAC PANEL(CRET KIN+CKTOT+MB+TROPI)
CK, MB: 2.7 ng/mL (ref 0.3–4.0)
Relative Index: 2.4 (ref 0.0–2.5)
Relative Index: 2.1 (ref 0.0–2.5)
Troponin I: <0.3 ng/mL (ref <0.30)

## 2011-07-07 LAB — URINALYSIS, ROUTINE W REFLEX MICROSCOPIC
Bilirubin Urine: NEGATIVE
Hgb urine dipstick: NEGATIVE
Ketones, ur: NEGATIVE mg/dL
Nitrite: NEGATIVE
Protein, ur: NEGATIVE mg/dL
Urobilinogen, UA: 0.2 mg/dL (ref 0.0–1.0)

## 2011-07-07 LAB — CK TOTAL AND CKMB (NOT AT ARMC)
CK, MB: 3.4 ng/mL (ref 0.3–4.0)
Relative Index: 1.9 (ref 0.0–2.5)

## 2011-07-07 LAB — COMPREHENSIVE METABOLIC PANEL
CO2: 28 mEq/L (ref 19–32)
Calcium: 10.1 mg/dL (ref 8.4–10.5)
Creatinine, Ser: 1.04 mg/dL (ref 0.50–1.10)
GFR calc Af Amer: 60 mL/min — ABNORMAL LOW (ref >90)
GFR calc non Af Amer: 52 mL/min — ABNORMAL LOW (ref >90)
Glucose, Bld: 105 mg/dL — ABNORMAL HIGH (ref 70–99)
Total Protein: 8.1 g/dL (ref 6.0–8.3)

## 2011-07-07 LAB — TROPONIN I: Troponin I: <0.3 ng/mL (ref <0.30)

## 2011-07-07 MED ORDER — SODIUM CHLORIDE 0.9 % IJ SOLN
3.0000 mL | Freq: Two times a day (BID) | INTRAMUSCULAR | Status: DC
  Administered 2011-07-07 – 2011-07-09 (×6): 3 mL via INTRAVENOUS

## 2011-07-07 MED ORDER — POTASSIUM CHLORIDE 20 MEQ PO PACK
20.0000 meq | PACK | Freq: Every day | ORAL | Status: DC
  Filled 2011-07-07: qty 1

## 2011-07-07 MED ORDER — ALPRAZOLAM 0.25 MG PO TABS
0.2500 mg | ORAL_TABLET | Freq: Three times a day (TID) | ORAL | Status: DC | PRN
Start: 2011-07-07 — End: 2011-07-09
  Administered 2011-07-07 – 2011-07-08 (×2): 0.25 mg via ORAL
  Filled 2011-07-07 (×2): qty 1

## 2011-07-07 MED ORDER — SODIUM CHLORIDE 0.9 % IJ SOLN
3.0000 mL | Freq: Two times a day (BID) | INTRAMUSCULAR | Status: DC
Start: 2011-07-07 — End: 2011-07-07

## 2011-07-07 MED ORDER — INFLUENZA VIRUS VACC SPLIT PF IM SUSP
0.5000 mL | Freq: Once | INTRAMUSCULAR | Status: AC
  Administered 2011-07-07: 0.5 mL via INTRAMUSCULAR
  Filled 2011-07-07: qty 0.5

## 2011-07-07 MED ORDER — FUROSEMIDE 20 MG PO TABS
20.0000 mg | ORAL_TABLET | Freq: Every day | ORAL | Status: DC
  Administered 2011-07-07 – 2011-07-09 (×3): 20 mg via ORAL
  Filled 2011-07-07 (×3): qty 1

## 2011-07-07 MED ORDER — MECLIZINE HCL 25 MG PO TABS
25.0000 mg | ORAL_TABLET | Freq: Three times a day (TID) | ORAL | Status: DC | PRN
  Administered 2011-07-07 – 2011-07-08 (×3): 25 mg via ORAL
  Filled 2011-07-07 (×3): qty 1

## 2011-07-07 MED ORDER — LORATADINE 10 MG PO TABS
10.0000 mg | ORAL_TABLET | Freq: Every day | ORAL | Status: DC | PRN
  Administered 2011-07-08 – 2011-07-09 (×3): 10 mg via ORAL
  Filled 2011-07-07 (×2): qty 1

## 2011-07-07 MED ORDER — PROMETHAZINE HCL 25 MG/ML IJ SOLN
12.5000 mg | Freq: Four times a day (QID) | INTRAMUSCULAR | Status: DC | PRN
  Administered 2011-07-07 – 2011-07-08 (×3): 12.5 mg via INTRAVENOUS
  Filled 2011-07-07 (×3): qty 1

## 2011-07-07 MED ORDER — ENOXAPARIN SODIUM 40 MG/0.4ML ~~LOC~~ SOLN
40.0000 mg | SUBCUTANEOUS | Status: DC
  Administered 2011-07-07 – 2011-07-09 (×3): 40 mg via SUBCUTANEOUS
  Filled 2011-07-07 (×4): qty 0.4

## 2011-07-07 MED ORDER — GABAPENTIN 300 MG PO CAPS
300.0000 mg | ORAL_CAPSULE | Freq: Two times a day (BID) | ORAL | Status: DC
  Administered 2011-07-07 – 2011-07-09 (×5): 300 mg via ORAL
  Filled 2011-07-07 (×6): qty 1

## 2011-07-07 MED ORDER — MORPHINE SULFATE 2 MG/ML IJ SOLN
2.0000 mg | INTRAMUSCULAR | Status: DC | PRN
  Administered 2011-07-07: 2 mg via INTRAVENOUS
  Filled 2011-07-07: qty 1

## 2011-07-07 MED ORDER — ACETAMINOPHEN 325 MG PO TABS: 650.0000 mg | ORAL_TABLET | Freq: Four times a day (QID) | ORAL | Status: DC | PRN

## 2011-07-07 MED ORDER — HYDRALAZINE HCL 10 MG PO TABS
10.0000 mg | ORAL_TABLET | Freq: Three times a day (TID) | ORAL | Status: DC
  Administered 2011-07-07 – 2011-07-08 (×3): 10 mg via ORAL
  Filled 2011-07-07 (×6): qty 1

## 2011-07-07 MED ORDER — POTASSIUM CHLORIDE CRYS ER 20 MEQ PO TBCR
20.0000 meq | EXTENDED_RELEASE_TABLET | Freq: Two times a day (BID) | ORAL | Status: DC
  Administered 2011-07-07 – 2011-07-09 (×5): 20 meq via ORAL
  Filled 2011-07-07 (×6): qty 1

## 2011-07-07 MED ORDER — ASPIRIN 325 MG PO TABS
325.0000 mg | ORAL_TABLET | Freq: Every day | ORAL | Status: DC
  Administered 2011-07-07 – 2011-07-09 (×3): 325 mg via ORAL
  Filled 2011-07-07 (×4): qty 1

## 2011-07-07 MED ORDER — METOPROLOL TARTRATE 25 MG PO TABS
25.0000 mg | ORAL_TABLET | Freq: Two times a day (BID) | ORAL | Status: DC
  Administered 2011-07-07 – 2011-07-09 (×6): 25 mg via ORAL
  Filled 2011-07-07 (×7): qty 1

## 2011-07-07 MED ORDER — ACETAMINOPHEN 325 MG PO TABS
650.0000 mg | ORAL_TABLET | ORAL | Status: DC | PRN
  Administered 2011-07-07: 650 mg via ORAL
  Filled 2011-07-07 (×2): qty 2

## 2011-07-07 MED ORDER — OLMESARTAN MEDOXOMIL 20 MG PO TABS
20.0000 mg | ORAL_TABLET | Freq: Every day | ORAL | Status: DC
  Administered 2011-07-07 – 2011-07-09 (×3): 20 mg via ORAL
  Filled 2011-07-07 (×3): qty 1

## 2011-07-07 MED ORDER — EZETIMIBE 10 MG PO TABS
10.0000 mg | ORAL_TABLET | Freq: Every day | ORAL | Status: DC
  Administered 2011-07-07 – 2011-07-09 (×3): 10 mg via ORAL
  Filled 2011-07-07 (×3): qty 1

## 2011-07-07 NOTE — H&P (Addendum)
NAMEAXEL, MEAS NO.:  [id number]  MEDICAL RECORD NO.:  [id number]  LOCATION:  3701                         FACILITY:  [facility]  PHYSICIAN:  Tarry Kos, MD       DATE OF BIRTH:  08-Jun-1936  DATE OF ADMISSION:  07/06/2011 DATE OF DISCHARGE:                             HISTORY & PHYSICAL   CHIEF COMPLAINT:  Elevated blood pressure and chest pressure.  HPI:  [name] is a very pleasant 75 year old African American female with a history of hypertension, paroxysmal AFib, coronary artery disease, fibromyalgia who presents to the emergency department after several days of having systolic blood pressures in the 200 range at home, she started experiencing some chest pressure and dizziness this morning, which she has had these sort of symptoms before in the past from uncontrolled blood pressure and came to the ED since she was worried about her blood pressure being so high.  She has not had any medication changes recently actually couple of months ago she was hospitalized for AFib and was started on a beta blocker, but her blood pressure has been uncontrolled for at least a week now.  She does have Diovan 80 mg tablets at home.  She is supposed to be taking 160 mg tablets and she has been taking 3 of those, but says that she has not had any improvement in her blood pressure.  She has received nitroglycerin sublingually in the ED and her blood pressures reported as dropped from 212 systolic to 100s systolic with 1 nitroglycerin sublingual tablet which I find very hard to believe, a repeat systolic blood pressure currently is about 116.  She currently is chest pain- free.  She denies any focal neurologic deficits.  Denies experiencing any difficulty with the arm or leg movement or slurred speech.  PAST MEDICAL HISTORY: 1. Fibromyalgia. 2  Coronary artery disease status post stenting. 1. Polypectomy. 2. GERD. 3. Diverticulosis. 4. Hypertension. 5.  Hyperlipidemia. 6. Anxiety. 7. Hiatal hernia.  ALLERGIES:  None.  MEDICATIONS:  She is unsure of the doses but she is on aspirin, Claritin, Diovan, Lasix, metoprolol, Neurontin, KCl and Xanax.  SOCIAL HISTORY:  She is a nondrinker.  No alcohol.  No IV drug abuse. Her husband is with her right now.  FAMILY HISTORY:  Her mother died of a stroke.  REVIEW OF SYSTEMS:  Otherwise negative.  PHYSICAL EXAMINATION:  VITALS:  Initially, her blood pressure was 221/89, pulse 96, respirations 18, 98% O2 sats on room air.  Her last recorded blood pressure is 100/53. GENERAL:  She is alert and oriented x4.  No apparent distress. Cooperative and friendly. HEENT:  Extraocular movements intact.  Pupils equal and reactive to light.  Oropharynx clear.  Mucous membranes are moist. NECK:  No JVD.  No carotid bruits. COR:  Regular rate and rhythm without murmurs, rubs, or gallops. CHEST:  Clear to auscultation bilaterally.  .  No wheeze, rhonchi, or rales. ABDOMEN:  Soft, nontender.  No hepatosplenomegaly.  No rebound.  No guarding. EXTREMITIES:  No clubbing, cyanosis, or edema. PSYCH:  Normal mood and affect. NEURO:  No focal neurologic deficits.  Cranial nerves II through XII grossly intact.  LABORATORY DATA:  CT of her head is negative.  Chest x-ray is negative. Cardiac enzymes are negative.  Electrolytes normal.  CBC normal.  EKG, no acute changes.  ASSESSMENT/PLAN:  This is a 75 year old female with hypertensive urgency with chest pain and dizziness. 1. Hypertensive urgency, probably explains her chest pressure and     dizziness.  She just recently had a 2D echo, which was normal, with     normal EF.  I am going to place her on metoprolol for now.  Recheck     her blood pressure.  Continue her on Diovan and titrate her meds as     needed, however, I am going to just continue monitoring her blood     pressure before making any major adjustments since her systolic     dropped from 221-100,  which I find a little hard to believe.  I     have put parameters to call for systolics over 182, at which point,     I will provide p.r.n. hydralazine.  We will serial her cardiac     enzymes.  Because of her dizziness and her vascular history, I am     going to check carotid Dopplers.  She is not having any focal     neurologic deficits, so I am not going to proceed with any further     imaging of her brain unless she develops any neurological symptoms. 2. History of coronary artery disease.  This is again stable.  Place     her on aspirin. 3. Anxiety, clarify her home medications and continue as needed.  She     is a full code, further recommendations depending on her lab     reports.          ______________________________ Tarry Kos, MD     RD/MEDQ  D:  07/07/2011  T:  07/07/2011  Job:  [id number]  Electronically Signed by Tarry Kos MD on 07/10/2011 09:24:32 PM

## 2011-07-07 NOTE — Progress Notes (Signed)
Subjective: Patient complains of mild headache, mild blurry vision and room spinning dizziness when turning her head to the left. She denies any chest pain or shortness of breath.  She also complains of episodes of lower Cordran abdominal pain which she says she often notices whenever her blood pressure increases. This usually leads to increased bowel activity although she would not describe this as diarrhea. She really otherwise has no complaints.  Objective: Weight change:   Intake/Output Summary (Last 24 hours) at 07-31-2011 1228 Last data filed at 07-31-11 0700  Gross per 24 hour  Intake    480 ml  Output      0 ml  Net    480 ml   BP 173/73  Pulse 60  Temp(Src) 97.7 F (36.5 C) (Oral)  Resp 18  Ht 5\' 7"  (1.702 m)  Wt 91.8 kg (202 lb 6.1 oz)  BMI 31.70 kg/m2  SpO2 94% General appearance: alert, cooperative, appears stated age and no distress Head: Normocephalic, without obvious abnormality, atraumatic Eyes: conjunctivae/corneas clear. PERRL, EOM's intact. Fundi benign., Not able to establish nystagmus Lungs: clear to auscultation bilaterally Heart: irregularly irregular rhythm, rate controlled Abdomen: soft, non-tender; bowel sounds normal; no masses,  no organomegaly, despite her complaints of abdominal pain I am not able to elicit additional pain with palpation. Extremities: extremities normal, atraumatic, no cyanosis or edema Attempted to use Sewell maneuvers to resolve her vertigo without success. Lab Results: Basic Metabolic Panel:  Basename 07/06/11 2343 07/06/11 2318  NA 139 138  K 3.4* 3.5  CL 102 99  CO2 -- 28  GLUCOSE 108* 105*  BUN 19 18  CREATININE 1.20* 1.04  CALCIUM -- 10.1  MG -- --  PHOS -- --   Liver Function Tests:  [facility] 07/06/11 2318  AST 21  ALT 21  ALKPHOS 87  BILITOT 0.2*  PROT 8.1  ALBUMIN 4.0   CBC:  Basename 07/06/11 2343 07/06/11 2318  WBC -- 7.5  NEUTROABS -- 3.7  HGB 16.3* 14.6  HCT 48.0* 43.1  MCV -- 84.5  PLT -- 213    Cardiac Enzymes:  Basename July 31, 2011 0630 07/06/11 2358  CKTOTAL 124 180*  CKMB 3.0 3.4  CKMBINDEX -- --  TROPONINI <0.30 <0.30   Micro Results: No results found for this or any previous visit (from the past 240 hour(s)).  Studies/Results: Dg Chest 2 View 2011-07-31    IMPRESSION: Bibasilar atelectasis.    Ct Head Wo Contrast 07-31-11 IMPRESSION: Minimal periventricular white matter hypodensities.  No acute intracranial abnormality.  Dg Abd 2 Views 07/31/2011 IMPRESSION: Unremarkable abdominal radiographs.  No evidence of ileus, obstruction or grossly increased stool volume.   Medications: Scheduled Meds:   . aspirin  325 mg Oral Daily  . enoxaparin (LOVENOX) injection  40 mg Subcutaneous Q24H  . ezetimibe  10 mg Oral Daily  . furosemide  20 mg Oral Daily  . gabapentin  300 mg Oral BID  . influenza  inactive virus vaccine  0.5 mL Intramuscular Once  . metoprolol tartrate  25 mg Oral BID  . olmesartan  20 mg Oral Daily  . potassium chloride  20 mEq Oral BID  . sodium chloride  3 mL Intravenous Q12H  . DISCONTD: potassium chloride  20 mEq Oral Daily  . DISCONTD: sodium chloride  3 mL Intravenous Q12H   Continuous Infusions:  PRN Meds:.acetaminophen, ALPRAZolam, loratadine, meclizine, morphine injection, promethazine, DISCONTD: acetaminophen  Assessment/Plan: Patient Active Hospital Problem List: HTN (hypertension), malignant (2011-07-31)   Assessment: Will add hydralazine  to her home medication regimen along with her home meds   Vertigo, benign positional (07/07/2011)  there is no reason to think that this is stroke related. I am clearly able to elicit vertigo using Hallpike maneuvers. Treat with when necessary Antivert and have provided patient information on using maneuvers to treat this at home.  Dizziness (07/07/2011) Secondary to vertigo. See above.  Abdominal pain: Will check two-view abdominal x-ray to rule out ileus versus early small bowel obstruction.   CAD  (coronary artery disease) (07/07/2011) stable.   A-fib (07/07/2011)  stable.   LOS: 1 day   Hollice Espy 07/07/2011, 12:28 PM

## 2011-07-08 ENCOUNTER — Encounter (HOSPITAL_COMMUNITY): Payer: Self-pay | Admitting: *Deleted

## 2011-07-08 ENCOUNTER — Observation Stay (HOSPITAL_COMMUNITY): Payer: Medicare Other

## 2011-07-08 LAB — CBC
HCT: 41 % (ref 36.0–46.0)
MCHC: 30.2 g/dL (ref 30.0–36.0)
Platelets: 235 10*3/uL (ref 150–400)
RDW: 15 % (ref 11.5–15.5)
WBC: 5.7 10*3/uL (ref 4.0–10.5)

## 2011-07-08 LAB — BASIC METABOLIC PANEL
Calcium: 9.7 mg/dL (ref 8.4–10.5)
Chloride: 104 mEq/L (ref 96–112)
Creatinine, Ser: 0.93 mg/dL (ref 0.50–1.10)
GFR calc Af Amer: 69 mL/min — ABNORMAL LOW (ref >90)
GFR calc non Af Amer: 60 mL/min — ABNORMAL LOW (ref >90)

## 2011-07-08 LAB — LIPASE, BLOOD: Lipase: 22 U/L (ref 11–59)

## 2011-07-08 MED ORDER — IOHEXOL 300 MG/ML  SOLN
80.0000 mL | Freq: Once | INTRAMUSCULAR | Status: AC | PRN
  Administered 2011-07-08: 80 mL via INTRAVENOUS

## 2011-07-08 MED ORDER — POLYETHYLENE GLYCOL 3350 17 G PO PACK
17.0000 g | PACK | Freq: Every day | ORAL | Status: DC | PRN
  Administered 2011-07-08: 17 g via ORAL
  Filled 2011-07-08 (×2): qty 1

## 2011-07-08 MED ORDER — HYDRALAZINE HCL 25 MG PO TABS
25.0000 mg | ORAL_TABLET | Freq: Three times a day (TID) | ORAL | Status: DC
  Administered 2011-07-08 – 2011-07-09 (×3): 25 mg via ORAL
  Filled 2011-07-08 (×6): qty 1

## 2011-07-08 NOTE — Progress Notes (Signed)
Initial UR complete.  

## 2011-07-08 NOTE — Progress Notes (Signed)
Please see shadow chart for Care Mgt documentation  

## 2011-07-08 NOTE — Progress Notes (Signed)
Subjective: Patient is feeling much better. She denies any chest pain or shortness of breath. Her headache and blurry vision now resolved. Her vertigo symptoms are resolved. Her only complaint is of continued abdominal pain, although it is of note that previously she was complaining of pain in the left lower to left middle quadrant. And now appears to be more in the left upper to mid epigastric area. Objective: Weight change: -1.8 kg (-3 lb 15.5 oz)  Intake/Output Summary (Last 24 hours) at 07/08/11 1735 Last data filed at 07/08/11 1500  Gross per 24 hour  Intake    600 ml  Output      0 ml  Net    600 ml   BP 140/68  Pulse 65  Temp(Src) 98.5 F (36.9 C) (Oral)  Resp 20  Ht 5\' 7"  (1.702 m)  Wt 90.3 kg (199 lb 1.2 oz)  BMI 31.18 kg/m2  SpO2 96% General appearance: alert, cooperative, appears stated age and no distress Head: Normocephalic, without obvious abnormality, atraumatic Eyes: conjunctivae/corneas clear. PERRL, EOM's intact. Fundi benign., Not able to establish nystagmus Lungs: clear to auscultation bilaterally Heart: irregularly irregular rhythm, rate controlled Abdomen: soft, non-tender; bowel sounds normal; no masses,  no organomegaly, despite her complaints of abdominal pain I am not able to elicit additional pain with palpation.-Similar to yesterday. Extremities: extremities normal, atraumatic, no cyanosis or edema  Lab Results: Basic Metabolic Panel:  Basename 07/08/11 0800 07/06/11 2343 07/06/11 2318  NA 142 139 --  K 4.0 3.4* --  CL 104 102 --  CO2 30 -- 28  GLUCOSE 118* 108* --  BUN 15 19 --  CREATININE 0.93 1.20* --  CALCIUM 9.7 -- 10.1  MG -- -- --  PHOS -- -- --   Liver Function Tests:  [facility] 07/06/11 2318  AST 21  ALT 21  ALKPHOS 87  BILITOT 0.2*  PROT 8.1  ALBUMIN 4.0   CBC:  Basename 07/08/11 0800 07/06/11 2343 07/06/11 2318  WBC 5.7 -- 7.5  NEUTROABS -- -- 3.7  HGB 12.4 16.3* --  HCT 41.0 48.0* --  MCV 85.8 -- 84.5  PLT 235 -- 213    Cardiac Enzymes:  Basename July 31, 2011 1304 2011/07/31 0630 07/06/11 2358  CKTOTAL 127 124 180*  CKMB 2.7 3.0 3.4  CKMBINDEX -- -- --  TROPONINI <0.30 <0.30 <0.30   Micro Results: No results found for this or any previous visit (from the past 240 hour(s)).  Studies/Results: Dg Chest 2 View 07/31/11    IMPRESSION: Bibasilar atelectasis.    Ct Head Wo Contrast 07/31/11 IMPRESSION: Minimal periventricular white matter hypodensities.  No acute intracranial abnormality.  Dg Abd 2 Views 07-31-2011 IMPRESSION: Unremarkable abdominal radiographs.  No evidence of ileus, obstruction or grossly increased stool volume.   Medications: Scheduled Meds:    . aspirin  325 mg Oral Daily  . enoxaparin (LOVENOX) injection  40 mg Subcutaneous Q24H  . ezetimibe  10 mg Oral Daily  . furosemide  20 mg Oral Daily  . gabapentin  300 mg Oral BID  . hydrALAZINE  25 mg Oral Q8H  . influenza  inactive virus vaccine  0.5 mL Intramuscular Once  . metoprolol tartrate  25 mg Oral BID  . olmesartan  20 mg Oral Daily  . potassium chloride  20 mEq Oral BID  . sodium chloride  3 mL Intravenous Q12H  . DISCONTD: hydrALAZINE  10 mg Oral Q8H   Continuous Infusions:  PRN Meds:.acetaminophen, ALPRAZolam, loratadine, meclizine, morphine injection, polyethylene glycol, promethazine  Assessment/Plan: Patient Active Hospital Problem List: HTN (hypertension), malignant (07/07/2011)   Assessment: Pressure still elevated with systolic in the 140s to 150s. She is tolerating hydralazine. I'm increasing hydralazine from 10-25 3 times a day   Vertigo, benign positional (07/07/2011)  much improved. Continue when necessary Antivert. Patient has instructions for home on maneuvers to improve this if this recurs  Dizziness (07/07/2011) Secondary to vertigo. See above.  Abdominal pain: Two-view abdominal x-ray was unremarkable. Given patient's continued complaints and concern about the possibility of pancreatitis versus less  likely ischemic colitis. I went ahead and ordered a CT abdomen and pelvis as well as a check a lipase level   CAD (coronary artery disease) (07/07/2011) stable.   A-fib (07/07/2011)  stable.  Home in a.m.  LOS: 2 days   KRISHNAN,SENDIL K 07/08/2011, 5:35 PM

## 2011-07-09 MED ORDER — MORPHINE SULFATE 4 MG/ML IJ SOLN: 2.0000 mg | INTRAMUSCULAR | Status: DC | PRN

## 2011-07-09 MED ORDER — HYDRALAZINE HCL 25 MG PO TABS: 25.0000 mg | ORAL_TABLET | Freq: Three times a day (TID) | ORAL | Status: DC

## 2011-07-09 NOTE — Progress Notes (Signed)
Pt for dc today. MD did not feel pt needs any hhc. Will follow up w pcp.

## 2011-07-09 NOTE — Discharge Summary (Signed)
DISCHARGE SUMMARY  [name]  MR#: [id number]  DOB:26-Jun-1936  Date of Admission: 07/06/2011 Date of Discharge: 07/09/2011  Attending Physician:Larina Lieurance K  Patient's NFA:OZHYQMV,HQIONGEX R., MD  Consults:  none  Discharge Diagnoses: Present on Admission:  .Vertigo, benign positional .HTN (hypertension), malignant .Dizziness .CAD (coronary artery disease) .A-fib .Abdominal pain, lower    Current Discharge Medication List    START taking these medications   Details  hydrALAZINE (APRESOLINE) 25 MG tablet Take 1 tablet (25 mg total) by mouth 3 (three) times daily. Qty: 90 tablet, Refills: 1      CONTINUE these medications which have NOT CHANGED   Details  ALPRAZolam (XANAX) 0.25 MG tablet Take 0.25 mg by mouth 3 (three) times daily as needed. anxiety    aspirin EC 81 MG tablet Take 81 mg by mouth daily.      ezetimibe (ZETIA) 10 MG tablet Take 10 mg by mouth daily.      furosemide (LASIX) 20 MG tablet Take 20 mg by mouth daily.      gabapentin (NEURONTIN) 300 MG capsule Take 300 mg by mouth 2 (two) times daily.      loratadine (CLARITIN) 10 MG tablet Take 10 mg by mouth daily as needed. allergies     Meclizine HCl (ANTIVERT PO) Take 1 tablet by mouth daily as needed.      metoprolol (TOPROL-XL) 50 MG 24 hr tablet Take 50 mg by mouth daily.      potassium chloride (KLOR-CON) 20 MEQ packet Take 20 mEq by mouth daily.      valsartan (DIOVAN) 160 MG tablet Take 160 mg by mouth daily.            Hospital Course: Present on Admission:  .Vertigo, benign positional: Patient initially presented with complaints of headache, dizziness and noted to have elevated blood pressures. There was a concern about the possibility of a CVA. Initial CT scan of the head followed by MRI was negative. We'll address blood pressure concerns below. In regards to her dizziness, in history taking for the patient was determined that likely she had vertigo she does have a previous  history of. I was able to perform Hallpike maneuvers which did indeed confirm this diagnosis. Patient was treated with when necessary Antivert and attempts using Epley and Sewell maneuvers did improve her symptoms. She does have some mild vertigo when she turns her head to the left still, however is much improved from previous. I have given the patient instructions on how to use maneuvers by self 4 with help at home.  Marland KitchenHTN   (hypertension), malignant: Patient states that the pressures not always been well controlled. Per the problem is that she states that she's not sure who should be watching her blood pressure, her primary care doctor or her cardiologist. I told her that we would help her in the hospital to get her blood pressure under control and then reach out to her primary care doctor to help manage her blood pressure issues. Cardiology stop by for a courtesy visit but stated that her acute heart issues were currently stable. Patient was resumed on her home medications and in addition I've and hydralazine which was titrated up to 25 by mouth 3 times a day.   .Dizziness: Felt to be secondary to vertigo. See above.  Marland KitchenCAD (coronary artery disease): Stable.  .A-fib: Stable., Paroxysmal   .Abdominal pain, lower:patient initially was complaining of some left lower quadrant to mid lower quadrant abdominal pain on hospital day 2 in the  hospital day 3 sodium 1 epigastric in nature. Initial abdominal x-ray was unremarkable. Given her persistence of symptoms I went ahead and checked a abdominal CT to make sure she had no signs of ischemic colitis. Her CT only noted diverticulosis. An extensive discussion with the patient that when she starts to have complaints of left lower quadrant abdominal pain to talk to her primary care physician. In addition and continue to encourage a low residue diet.    Day of Discharge BP 151/82  Pulse 112  Temp(Src) 98.4 F (36.9 C) (Oral)  Resp 18  Ht 5\' 7"  (1.702 m)  Wt  91.581 kg (201 lb 14.4 oz)  BMI 31.62 kg/m2  SpO2 92%  please note that the patient's elevated blood pressure and heart rate were prior to her getting her beta blocker today.  Physical Exam: General appearance: alert, cooperative, appears stated age and no distress  Head: Normocephalic, without obvious abnormality, atraumatic  Lungs: clear to auscultation bilaterally  Heart: irregularly irregular rhythm, rate controlled  Abdomen: soft, non-tender; bowel sounds normal; no masses, no organomegaly, Extremities: extremities normal, atraumatic, no cyanosis or edema   Results for orders placed during the hospital encounter of 07/06/11 (from the past 24 hour(s))  LIPASE, BLOOD     Status: Normal   Collection Time   07/08/11  5:58 PM      Component Value Range   Lipase 22  11 - 59 (U/L)    Disposition:  improved    Follow-up Appts: Discharge Orders    Future Orders Please Complete By Expires   Diet - low sodium heart healthy      Increase activity slowly         Follow-up with Dr. Renae Gloss ,  PCP in 2 weeks.  followup with Dr. Katrinka Blazing, cardiology in 4 weeks   Tests Needing Follow-up:  none   Signed: Juliette Standre K 07/09/2011, 10:53 AM

## 2011-07-09 NOTE — Progress Notes (Signed)
Pt discharge instructions given. Questions and concerns answered and addressed. No personal belongings left in room. Taken down to personal vehicle via wheelchair. Charlett Lango Johnson11/02/2011   .

## 2011-11-12 ENCOUNTER — Inpatient Hospital Stay (HOSPITAL_COMMUNITY)
Admission: EM | Admit: 2011-11-12 | Discharge: 2011-11-14 | DRG: 392 | Disposition: A | Discharge status: Home / Self Care | Payer: Medicare Other | Source: Ambulatory Visit | Attending: Internal Medicine | Admitting: Internal Medicine

## 2011-11-12 ENCOUNTER — Encounter (HOSPITAL_COMMUNITY): Payer: Self-pay | Admitting: Emergency Medicine

## 2011-11-12 DIAGNOSIS — I10 Essential (primary) hypertension: Secondary | ICD-10-CM | POA: Diagnosis present

## 2011-11-12 DIAGNOSIS — I4891 Unspecified atrial fibrillation: Secondary | ICD-10-CM | POA: Diagnosis present

## 2011-11-12 DIAGNOSIS — A088 Other specified intestinal infections: Principal | ICD-10-CM | POA: Diagnosis present

## 2011-11-12 DIAGNOSIS — I25119 Atherosclerotic heart disease of native coronary artery with unspecified angina pectoris: Secondary | ICD-10-CM | POA: Diagnosis present

## 2011-11-12 DIAGNOSIS — Z9861 Coronary angioplasty status: Secondary | ICD-10-CM

## 2011-11-12 DIAGNOSIS — R197 Diarrhea, unspecified: Secondary | ICD-10-CM

## 2011-11-12 DIAGNOSIS — R112 Nausea with vomiting, unspecified: Secondary | ICD-10-CM | POA: Diagnosis present

## 2011-11-12 DIAGNOSIS — Z79899 Other long term (current) drug therapy: Secondary | ICD-10-CM

## 2011-11-12 DIAGNOSIS — I2510 Atherosclerotic heart disease of native coronary artery without angina pectoris: Secondary | ICD-10-CM | POA: Diagnosis present

## 2011-11-12 DIAGNOSIS — F411 Generalized anxiety disorder: Secondary | ICD-10-CM | POA: Diagnosis present

## 2011-11-12 DIAGNOSIS — R109 Unspecified abdominal pain: Secondary | ICD-10-CM | POA: Diagnosis present

## 2011-11-12 DIAGNOSIS — Z7982 Long term (current) use of aspirin: Secondary | ICD-10-CM

## 2011-11-12 LAB — DIFFERENTIAL
Eosinophils Relative: 2 % (ref 0–5)
Lymphocytes Relative: 19 % (ref 12–46)
Lymphs Abs: 1.1 10*3/uL (ref 0.7–4.0)
Monocytes Relative: 4 % (ref 3–12)

## 2011-11-12 LAB — CBC
HCT: 40.8 % (ref 36.0–46.0)
Hemoglobin: 13.6 g/dL (ref 12.0–15.0)
MCV: 84.8 fL (ref 78.0–100.0)
Platelets: 230 10*3/uL (ref 150–400)
RBC: 4.81 MIL/uL (ref 3.87–5.11)
WBC: 5.7 10*3/uL (ref 4.0–10.5)

## 2011-11-12 NOTE — ED Notes (Signed)
PT. REPORTS VOMITTING WITH DIARRHEA AND GENERALIZED ABDOMINAL CRAMPING/CHILLS ONSET THIS MORNING .

## 2011-11-13 ENCOUNTER — Emergency Department (HOSPITAL_COMMUNITY): Payer: Medicare Other

## 2011-11-13 ENCOUNTER — Encounter (HOSPITAL_COMMUNITY): Payer: Self-pay | Admitting: Internal Medicine

## 2011-11-13 DIAGNOSIS — R109 Unspecified abdominal pain: Secondary | ICD-10-CM | POA: Diagnosis present

## 2011-11-13 DIAGNOSIS — R112 Nausea with vomiting, unspecified: Secondary | ICD-10-CM | POA: Diagnosis present

## 2011-11-13 LAB — HEPATIC FUNCTION PANEL
AST: 27 U/L (ref 0–37)
Albumin: 3.6 g/dL (ref 3.5–5.2)
Alkaline Phosphatase: 78 U/L (ref 39–117)
Total Bilirubin: 0.4 mg/dL (ref 0.3–1.2)

## 2011-11-13 LAB — BASIC METABOLIC PANEL
CO2: 27 mEq/L (ref 19–32)
Calcium: 9.3 mg/dL (ref 8.4–10.5)
Glucose, Bld: 139 mg/dL — ABNORMAL HIGH (ref 70–99)
Sodium: 137 mEq/L (ref 135–145)

## 2011-11-13 MED ORDER — DIPHENHYDRAMINE HCL 50 MG/ML IJ SOLN
25.0000 mg | Freq: Four times a day (QID) | INTRAMUSCULAR | Status: DC | PRN
  Administered 2011-11-13: 16:00:00 via INTRAVENOUS
  Filled 2011-11-13: qty 1

## 2011-11-13 MED ORDER — LORATADINE 10 MG PO TABS
10.0000 mg | ORAL_TABLET | Freq: Every day | ORAL | Status: DC | PRN
  Administered 2011-11-13: 10 mg via ORAL
  Filled 2011-11-13: qty 1

## 2011-11-13 MED ORDER — ASPIRIN EC 81 MG PO TBEC
81.0000 mg | DELAYED_RELEASE_TABLET | Freq: Every day | ORAL | Status: DC
  Administered 2011-11-13 – 2011-11-14 (×2): 81 mg via ORAL
  Filled 2011-11-13 (×2): qty 1

## 2011-11-13 MED ORDER — HYDRALAZINE HCL 20 MG/ML IJ SOLN
10.0000 mg | INTRAMUSCULAR | Status: DC | PRN
  Filled 2011-11-13: qty 0.5

## 2011-11-13 MED ORDER — IRBESARTAN 150 MG PO TABS
150.0000 mg | ORAL_TABLET | Freq: Every day | ORAL | Status: DC
  Administered 2011-11-13 – 2011-11-14 (×2): 150 mg via ORAL
  Filled 2011-11-13 (×2): qty 1

## 2011-11-13 MED ORDER — ONDANSETRON HCL 4 MG PO TABS: 4.0000 mg | ORAL_TABLET | Freq: Four times a day (QID) | ORAL | Status: DC | PRN

## 2011-11-13 MED ORDER — ONDANSETRON HCL 4 MG/2ML IJ SOLN
INTRAMUSCULAR | Status: AC
  Administered 2011-11-13: 4 mg
  Filled 2011-11-13: qty 2

## 2011-11-13 MED ORDER — METOPROLOL SUCCINATE ER 50 MG PO TB24
50.0000 mg | ORAL_TABLET | Freq: Every day | ORAL | Status: DC
  Filled 2011-11-13 (×2): qty 1

## 2011-11-13 MED ORDER — FENTANYL CITRATE 0.05 MG/ML IJ SOLN
INTRAMUSCULAR | Status: AC
  Administered 2011-11-13: 100 ug
  Filled 2011-11-13: qty 2

## 2011-11-13 MED ORDER — SODIUM CHLORIDE 0.9 % IV SOLN
INTRAVENOUS | Status: DC
  Administered 2011-11-13: 09:00:00 via INTRAVENOUS

## 2011-11-13 MED ORDER — HYDROMORPHONE HCL PF 1 MG/ML IJ SOLN
0.5000 mg | Freq: Once | INTRAMUSCULAR | Status: AC
  Administered 2011-11-13: 0.5 mg via INTRAVENOUS
  Filled 2011-11-13: qty 1

## 2011-11-13 MED ORDER — HYDRALAZINE HCL 25 MG PO TABS
25.0000 mg | ORAL_TABLET | Freq: Three times a day (TID) | ORAL | Status: DC
  Filled 2011-11-13 (×5): qty 1

## 2011-11-13 MED ORDER — ONDANSETRON HCL 4 MG/2ML IJ SOLN
4.0000 mg | Freq: Four times a day (QID) | INTRAMUSCULAR | Status: DC | PRN
  Administered 2011-11-13: 4 mg via INTRAVENOUS
  Filled 2011-11-13: qty 2

## 2011-11-13 MED ORDER — CIPROFLOXACIN IN D5W 400 MG/200ML IV SOLN
400.0000 mg | Freq: Two times a day (BID) | INTRAVENOUS | Status: DC
  Administered 2011-11-13 – 2011-11-14 (×3): 400 mg via INTRAVENOUS
  Filled 2011-11-13 (×4): qty 200

## 2011-11-13 MED ORDER — IOHEXOL 300 MG/ML  SOLN
20.0000 mL | INTRAMUSCULAR | Status: AC
  Administered 2011-11-13: 20 mL via ORAL

## 2011-11-13 MED ORDER — FAMOTIDINE IN NACL 20-0.9 MG/50ML-% IV SOLN
20.0000 mg | Freq: Once | INTRAVENOUS | Status: AC
  Administered 2011-11-13: 20 mg via INTRAVENOUS
  Filled 2011-11-13: qty 50

## 2011-11-13 MED ORDER — METRONIDAZOLE IN NACL 5-0.79 MG/ML-% IV SOLN
500.0000 mg | Freq: Three times a day (TID) | INTRAVENOUS | Status: DC
  Administered 2011-11-13 – 2011-11-14 (×4): 500 mg via INTRAVENOUS
  Filled 2011-11-13 (×6): qty 100

## 2011-11-13 MED ORDER — HYDROMORPHONE HCL PF 1 MG/ML IJ SOLN
0.5000 mg | INTRAMUSCULAR | Status: DC | PRN
  Administered 2011-11-13 (×2): 0.5 mg via INTRAVENOUS
  Administered 2011-11-13: 1 mg via INTRAVENOUS
  Filled 2011-11-13 (×3): qty 1

## 2011-11-13 MED ORDER — MECLIZINE HCL 12.5 MG PO TABS
12.5000 mg | ORAL_TABLET | Freq: Three times a day (TID) | ORAL | Status: DC | PRN
  Filled 2011-11-13: qty 1

## 2011-11-13 MED ORDER — ALPRAZOLAM 0.25 MG PO TABS: 0.2500 mg | ORAL_TABLET | Freq: Three times a day (TID) | ORAL | Status: DC | PRN

## 2011-11-13 MED ORDER — GABAPENTIN 300 MG PO CAPS
300.0000 mg | ORAL_CAPSULE | Freq: Two times a day (BID) | ORAL | Status: DC
  Administered 2011-11-13 – 2011-11-14 (×3): 300 mg via ORAL
  Filled 2011-11-13 (×4): qty 1

## 2011-11-13 MED ORDER — IOHEXOL 300 MG/ML  SOLN
100.0000 mL | Freq: Once | INTRAMUSCULAR | Status: AC | PRN
  Administered 2011-11-13: 100 mL via INTRAVENOUS

## 2011-11-13 MED ORDER — ACETAMINOPHEN 650 MG RE SUPP: 650.0000 mg | Freq: Four times a day (QID) | RECTAL | Status: DC | PRN

## 2011-11-13 MED ORDER — EZETIMIBE 10 MG PO TABS
10.0000 mg | ORAL_TABLET | Freq: Every day | ORAL | Status: DC
  Filled 2011-11-13 (×2): qty 1

## 2011-11-13 MED ORDER — ACETAMINOPHEN 325 MG PO TABS: 650.0000 mg | ORAL_TABLET | Freq: Four times a day (QID) | ORAL | Status: DC | PRN

## 2011-11-13 NOTE — ED Notes (Signed)
Dinner tray ordered Clear liquid diet.

## 2011-11-13 NOTE — ED Provider Notes (Signed)
History     CSN: [id number]  Arrival date & time 11/12/11  2333   First MD Initiated Contact with Patient 11/13/11 0207      Chief Complaint  Patient presents with  . Emesis     HPI PT. REPORTS VOMITTING WITH DIARRHEA AND GENERALIZED ABDOMINAL CRAMPING/CHILLS ONSET THIS MORNING .  Patient denies any hematemesis, melena, hematochezia.  Patient is not on any medication that would cause her symptoms.  Patient does have previous history of abdominal adhesions.  This however was not accompanied by diarrhea.  Past Medical History  Diagnosis Date  . Coronary artery disease   . Hypertension   . Anxiety   . Fibromyalgia   . Dysrhythmia     history of "irregular heart rate"'; pt. thinks it was A-fib    Past Surgical History  Procedure Date  . Breast surgery     right benign lumpectomy  . Diagnostic laparoscopy     lap abdominal lysis of adhesions  . Cardiac catheterization     1 stent  . Coronary angioplasty   . Abdominal hysterectomy     and separate oopherectomies  . Hernia repair     No family history on file.  History  Substance Use Topics  . Smoking status: Never Smoker   . Smokeless tobacco: Not on file  . Alcohol Use: No    OB History    Grav Para Term Preterm Abortions TAB SAB Ect Mult Living                  Review of Systems Negative except as noted in history of present illness Allergies  Review of patient's allergies indicates no known allergies.  Home Medications   Current Outpatient Rx  Name Route Sig Dispense Refill  . ALPRAZOLAM 0.25 MG PO TABS Oral Take 0.25 mg by mouth 3 (three) times daily as needed. anxiety    . ASPIRIN EC 81 MG PO TBEC Oral Take 81 mg by mouth daily.      [name] EZETIMIBE 10 MG PO TABS Oral Take 10 mg by mouth daily.      . FUROSEMIDE 20 MG PO TABS Oral Take 20 mg by mouth daily.      [name] GABAPENTIN 300 MG PO CAPS Oral Take 300 mg by mouth 2 (two) times daily.      [name] HYDRALAZINE HCL 25 MG PO TABS Oral Take 1 tablet (25 mg  total) by mouth 3 (three) times daily. 90 tablet 1  . LORATADINE 10 MG PO TABS Oral Take 10 mg by mouth daily as needed. allergies     . ANTIVERT PO Oral Take 1 tablet by mouth daily as needed.      [name] METOPROLOL SUCCINATE ER 50 MG PO TB24 Oral Take 50 mg by mouth daily.      [name] POTASSIUM CHLORIDE 20 MEQ PO PACK Oral Take 20 mEq by mouth daily.      [name] VALSARTAN 160 MG PO TABS Oral Take 160 mg by mouth daily.        BP 148/71  Pulse 104  Temp(Src) 99.4 F (37.4 C) (Oral)  Resp 20  SpO2 97%  Physical Exam  Nursing note and vitals reviewed. Constitutional: She is oriented to person, place, and time. She appears well-developed and well-nourished. No distress.  HENT:  Head: Normocephalic and atraumatic.  Eyes: Pupils are equal, round, and reactive to light.  Neck: Normal range of motion.  Cardiovascular: Normal rate and intact distal pulses.   Pulmonary/Chest:  No respiratory distress.  Abdominal: Normal appearance. She exhibits no distension. There is no rebound and no guarding.  Musculoskeletal: Normal range of motion.  Neurological: She is alert and oriented to person, place, and time. No cranial nerve deficit.  Skin: Skin is warm and dry. No rash noted.  Psychiatric: She has a normal mood and affect. Her behavior is normal.    ED Course  Procedures (including critical care time)  Labs Reviewed  BASIC METABOLIC PANEL - Abnormal; Notable for the following:    Glucose, Bld 139 (*)    GFR calc non Af Amer 81 (*)    All other components within normal limits  CBC  DIFFERENTIAL  HEPATIC FUNCTION PANEL  URINALYSIS, ROUTINE W REFLEX MICROSCOPIC  COMPREHENSIVE METABOLIC PANEL   No results found.   1. Nausea vomiting and diarrhea       MDM   Scheduled Meds:   . fentaNYL      . ondansetron       Continuous Infusions:  PRN Meds:.         Nelia Shi, MD 11/13/11 [phone]

## 2011-11-13 NOTE — ED Notes (Signed)
Family at bedside. 

## 2011-11-13 NOTE — H&P (Signed)
[name] is an 76 y.o. female.   PCP - Dr.Kimberly Renae Gloss. Chief Complaint: Abdominal pain nausea vomiting and diarrhea. HPI: 76 year-old female history of CAD status post stenting, hypertension, paroxysmal atrial fibrillation has been experiencing sudden onset of nausea vomiting and abdominal pain since yesterday morning. She's not able keeping anything. Since her abdominal pain started she's been having some chills and rigors. She has thrown up at least 3 times and same number of times she has had diarrhea. Patient denies any chest pain or shortness of breath but feels fatigued. At this time patient will be admitted for further management of her nausea vomiting and diarrhea with abdominal pain. Patient states per husband had recently diarrhea.  Past Medical History  Diagnosis Date  . Coronary artery disease   . Hypertension   . Anxiety   . Fibromyalgia   . Dysrhythmia     history of "irregular heart rate"'; pt. thinks it was A-fib    Past Surgical History  Procedure Date  . Breast surgery     right benign lumpectomy  . Diagnostic laparoscopy     lap abdominal lysis of adhesions  . Cardiac catheterization     1 stent  . Coronary angioplasty   . Abdominal hysterectomy     and separate oopherectomies  . Hernia repair     History reviewed. No pertinent family history. Social History:  reports that she has never smoked. She does not have any smokeless tobacco history on file. She reports that she does not drink alcohol. Her drug history not on file.  Allergies: No Known Allergies  Medications Prior to Admission  Medication Dose Route Frequency Provider Last Rate Last Dose  . fentaNYL (SUBLIMAZE) 0.05 MG/ML injection        100 mcg at 11/13/11 0205  . ondansetron (ZOFRAN) 4 MG/2ML injection        4 mg at 11/13/11 0205   Medications Prior to Admission  Medication Sig Dispense Refill  . ALPRAZolam (XANAX) 0.25 MG tablet Take 0.25 mg by mouth 3 (three) times daily as  needed. anxiety      . aspirin EC 81 MG tablet Take 81 mg by mouth daily.        . furosemide (LASIX) 20 MG tablet Take 10 mg by mouth daily.       [name] gabapentin (NEURONTIN) 300 MG capsule Take 300 mg by mouth 2 (two) times daily.        . hydrALAZINE (APRESOLINE) 25 MG tablet Take 1 tablet (25 mg total) by mouth 3 (three) times daily.  90 tablet  1  . loratadine (CLARITIN) 10 MG tablet Take 10 mg by mouth daily as needed. allergies       . meclizine (ANTIVERT) 12.5 MG tablet Take 12.5 mg by mouth 3 (three) times daily as needed. For dizziness      . potassium chloride SA (K-DUR,KLOR-CON) 20 MEQ tablet Take 20 mEq by mouth daily.      . Probiotic Product (ALIGN) 4 MG CAPS Take 1 capsule by mouth daily.      . valsartan (DIOVAN) 160 MG tablet Take 160 mg by mouth daily.          Results for orders placed during the hospital encounter of 11/12/11 (from the past 48 hour(s))  CBC     Status: Normal   Collection Time   11/12/11 11:38 PM      Component Value Range Comment   WBC 5.7  4.0 - 10.5 (K/uL)  RBC 4.81  3.87 - 5.11 (MIL/uL)    Hemoglobin 13.6  12.0 - 15.0 (g/dL)    HCT 40.9  81.1 - 91.4 (%)    MCV 84.8  78.0 - 100.0 (fL)    MCH 28.3  26.0 - 34.0 (pg)    MCHC 33.3  30.0 - 36.0 (g/dL)    RDW 78.2  95.6 - 21.3 (%)    Platelets 230  150 - 400 (K/uL)   DIFFERENTIAL     Status: Normal   Collection Time   11/12/11 11:38 PM      Component Value Range Comment   Neutrophils Relative 75  43 - 77 (%)    Neutro Abs 4.3  1.7 - 7.7 (K/uL)    Lymphocytes Relative 19  12 - 46 (%)    Lymphs Abs 1.1  0.7 - 4.0 (K/uL)    Monocytes Relative 4  3 - 12 (%)    Monocytes Absolute 0.2  0.1 - 1.0 (K/uL)    Eosinophils Relative 2  0 - 5 (%)    Eosinophils Absolute 0.1  0.0 - 0.7 (K/uL)    Basophils Relative 0  0 - 1 (%)    Basophils Absolute 0.0  0.0 - 0.1 (K/uL)   BASIC METABOLIC PANEL     Status: Abnormal   Collection Time   11/12/11 11:38 PM      Component Value Range Comment   Sodium 137  135 -  145 (mEq/L)    Potassium 3.9  3.5 - 5.1 (mEq/L)    Chloride 100  96 - 112 (mEq/L)    CO2 27  19 - 32 (mEq/L)    Glucose, Bld 139 (*) 70 - 99 (mg/dL)    BUN 10  6 - 23 (mg/dL)    Creatinine, Ser 0.86  0.50 - 1.10 (mg/dL)    Calcium 9.3  8.4 - 10.5 (mg/dL)    GFR calc non Af Amer 81 (*) >90 (mL/min)    GFR calc Af Amer >90  >90 (mL/min)   HEPATIC FUNCTION PANEL     Status: Normal   Collection Time   11/13/11  1:33 AM      Component Value Range Comment   Total Protein 7.1  6.0 - 8.3 (g/dL)    Albumin 3.6  3.5 - 5.2 (g/dL)    AST 27  0 - 37 (U/L) SLIGHT HEMOLYSIS   ALT 20  0 - 35 (U/L)    Alkaline Phosphatase 78  39 - 117 (U/L)    Total Bilirubin 0.4  0.3 - 1.2 (mg/dL)    Bilirubin, Direct <5.7  0.0 - 0.3 (mg/dL)    Indirect Bilirubin NOT CALCULATED  0.3 - 0.9 (mg/dL)    No results found.  Review of Systems  Constitutional: Positive for fever and chills.  HENT: Negative.   Eyes: Negative.   Cardiovascular: Negative.   Gastrointestinal: Positive for nausea, vomiting, abdominal pain and diarrhea.  Genitourinary: Negative.   Musculoskeletal: Negative.   Skin: Negative.   Neurological: Negative.   Endo/Heme/Allergies: Negative.   Psychiatric/Behavioral: Negative.     Blood pressure 148/71, pulse 104, temperature 99.4 F (37.4 C), temperature source Oral, resp. rate 20, SpO2 97.00%. Physical Exam  Constitutional: She is oriented to person, place, and time. She appears well-developed and well-nourished. No distress.  HENT:  Head: Normocephalic and atraumatic.  Right Ear: External ear normal.  Left Ear: External ear normal.  Mouth/Throat: No oropharyngeal exudate.  Eyes: Conjunctivae are normal. Pupils are equal, round, and  reactive to light. Right eye exhibits no discharge. Left eye exhibits no discharge. No scleral icterus.  Neck: Normal range of motion. Neck supple.  Cardiovascular: Normal rate, regular rhythm and normal heart sounds.   Respiratory: Effort normal and breath  sounds normal. No respiratory distress. She has no wheezes. She has no rales.  GI: Soft. Bowel sounds are normal. She exhibits no distension. There is no rebound.       Mild diffuse tenderness.  Musculoskeletal: Normal range of motion. She exhibits no edema and no tenderness.  Neurological: She is alert and oriented to person, place, and time.       Moves all limbs.  Skin: Skin is warm and dry. No rash noted. She is not diaphoretic. No erythema.  Psychiatric: Her behavior is normal.     Assessment/Plan #1. Abdominal pain with nausea vomiting and diarrhea most likely viral etiology -this time patient will be placed on clear liquid diets and since patient had some chills and rigors and patient is mildly febrile I will keep patient on Cipro and Flagyl get stool culture and CT of the abdomen as patient's abdomen is mildly tender. #2. History of CAD status post stenting, hypertension and anxiety - continue present medications.  CODE STATUS - full code.   Trystan Eads N. 11/13/2011, 4:24 AM

## 2011-11-13 NOTE — Progress Notes (Signed)
Patient examined and chart reviewed.Add Norovirus PCR to w/u. CT abd/pelvis is without pathology other than fatty liver (mild) and diverticulosis.  Per RN, pt developed itching and redness when given her home meds (hospital substitutes). Given Benadryl and Pepcid.   Calvert Cantor, MD [phone]

## 2011-11-13 NOTE — ED Notes (Signed)
[id number] Ready.  Dinner tray delivered.

## 2011-11-13 NOTE — ED Notes (Signed)
Pt. Is aware of needing urine specimen.   

## 2011-11-13 NOTE — Progress Notes (Signed)
Utilization review completed. [name] 11/13/2011 

## 2011-11-14 LAB — COMPREHENSIVE METABOLIC PANEL
ALT: 24 U/L (ref 0–35)
AST: 37 U/L (ref 0–37)
Albumin: 3.3 g/dL — ABNORMAL LOW (ref 3.5–5.2)
Alkaline Phosphatase: 72 U/L (ref 39–117)
Potassium: 4 mEq/L (ref 3.5–5.1)
Sodium: 139 mEq/L (ref 135–145)
Total Protein: 6.8 g/dL (ref 6.0–8.3)

## 2011-11-14 LAB — CBC
HCT: 42.2 % (ref 36.0–46.0)
MCHC: 32.2 g/dL (ref 30.0–36.0)
MCV: 86.3 fL (ref 78.0–100.0)
RDW: 14.6 % (ref 11.5–15.5)
WBC: 3.3 10*3/uL — ABNORMAL LOW (ref 4.0–10.5)

## 2011-11-14 MED ORDER — CIPROFLOXACIN HCL 500 MG PO TABS: 500.0000 mg | ORAL_TABLET | Freq: Two times a day (BID) | ORAL | Status: AC

## 2011-11-14 MED ORDER — METRONIDAZOLE 500 MG PO TABS: 500.0000 mg | ORAL_TABLET | Freq: Three times a day (TID) | ORAL | Status: AC

## 2011-11-14 MED ORDER — PROMETHAZINE HCL 12.5 MG PO TABS: 12.5000 mg | ORAL_TABLET | Freq: Four times a day (QID) | ORAL | Status: AC | PRN

## 2011-11-14 NOTE — Discharge Summary (Signed)
Physician Discharge Summary  Patient ID: [name] MRN: [id number] DOB/AGE: [birth year] 76 y.o.  Admit date: 11/12/2011 Discharge date: 11/14/2011  Primary Care Physician:  [name]., MD, MD  Discharge Diagnoses:    .Abdominal pain: Resolved  .Nausea and vomiting/ diarrhea : Resolved likely viral gastroenteritis  .A-fib .CAD (coronary artery disease) .HTN (hypertension), malignant  Consults:  None   Discharge Medications: Medication List  As of 11/14/2011 10:18 AM   STOP taking these medications         ANTIVERT PO      ezetimibe 10 MG tablet      metoprolol succinate 50 MG 24 hr tablet      potassium chloride 20 MEQ packet         TAKE these medications         ALIGN 4 MG Caps   Take 1 capsule by mouth daily.      ALPRAZolam 0.25 MG tablet   Commonly known as: XANAX   Take 0.25 mg by mouth 3 (three) times daily as needed. anxiety      aspirin EC 81 MG tablet   Take 81 mg by mouth daily.      ciprofloxacin 500 MG tablet   Commonly known as: CIPRO   Take 1 tablet (500 mg total) by mouth 2 (two) times daily.      furosemide 20 MG tablet   Commonly known as: LASIX   Take 10 mg by mouth daily.      gabapentin 300 MG capsule   Commonly known as: NEURONTIN   Take 300 mg by mouth 2 (two) times daily.      hydrALAZINE 25 MG tablet   Commonly known as: APRESOLINE   Take 1 tablet (25 mg total) by mouth 3 (three) times daily.      loratadine 10 MG tablet   Commonly known as: CLARITIN   Take 10 mg by mouth daily as needed. allergies      meclizine 12.5 MG tablet   Commonly known as: ANTIVERT   Take 12.5 mg by mouth 3 (three) times daily as needed. For dizziness      metroNIDAZOLE 500 MG tablet   Commonly known as: FLAGYL   Take 1 tablet (500 mg total) by mouth 3 (three) times daily.      potassium chloride SA 20 MEQ tablet   Commonly known as: K-DUR,KLOR-CON   Take 20 mEq by mouth daily.      promethazine 12.5 MG tablet   Commonly  known as: PHENERGAN   Take 1 tablet (12.5 mg total) by mouth every 6 (six) hours as needed for nausea.      valsartan 160 MG tablet   Commonly known as: DIOVAN   Take 160 mg by mouth daily.             Brief H and P: For complete details please refer to admission H and P, but in brief 76 year old female with history of coronary disease status post stenting, hypertension, paroxysmal A. fib presented with sudden onset of nausea vomiting, abdominal pain, unable to hold anything down. Patient also has diarrhea a few times prior to the admission.   Hospital Course:  Principal Problem:  *Abdominal pain with nausea vomiting and diarrhea: Most likely viral etiology. - Patient was admitted and placed on IV fluids, clear liquid diet. Patient was placed on ciprofloxacin and Flagyl as she was mildly febrile with abdominal tenderness. CT abdomen and pelvis was done which showed no acute findings, diverticulosis  but no signs of diverticulitis. At the time of discharge, patient's symptoms had completely resolved, diet was advanced and she was tolerating solids patent the discharge.   Active Problems:  HTN (hypertension): Patient was continued on all her antihypertensives  CAD (coronary artery disease) remained stable patient was continued on aspirin, beta blocker, ACE inhibitor:   A-fib: Rate controlled.    Day of Discharge BP 133/73  Pulse 71  Temp(Src) 97.1 F (36.2 C) (Oral)  Resp 18  Ht 5' 7.5" (1.715 m)  Wt 92.08 kg (203 lb)  BMI 31.32 kg/m2  SpO2 93%  Physical Exam: General: Alert and awake oriented x3 not in any acute distress. HEENT: anicteric sclera, pupils reactive to light and accommodation CVS: S1-S2 clear no murmur rubs or gallops Chest: clear to auscultation bilaterally, no wheezing rales or rhonchi Abdomen: soft nontender, nondistended, normal bowel sounds, no organomegaly Extremities: no cyanosis, clubbing or edema noted bilaterally Neuro: Cranial nerves II-XII intact,  no focal neurological deficits   The results of significant diagnostics from this hospitalization (including imaging, microbiology, ancillary and laboratory) are listed below for reference.    LAB RESULTS: Basic Metabolic Panel:  Lab 11/14/11 4098 11/12/11 2338  NA 139 137  K 4.0 3.9  CL 106 100  CO2 24 27  GLUCOSE 96 139*  BUN 8 10  CREATININE 0.82 0.78  CALCIUM 9.0 9.3  MG -- --  PHOS -- --   Liver Function Tests:  Lab 11/14/11 0635 11/13/11 0133  AST 37 27  ALT 24 20  ALKPHOS 72 78  BILITOT 0.2* 0.4  PROT 6.8 7.1  ALBUMIN 3.3* 3.6     Lab 11/14/11 0635 11/12/11 2338  WBC 3.3* 5.7  NEUTROABS -- 4.3  HGB 13.6 13.6  HCT 42.2 40.8  MCV 86.3 --  PLT 208 230    Significant Diagnostic Studies:  Ct Abdomen Pelvis W Contrast  11/13/2011  *RADIOLOGY REPORT*  Clinical Data: Acute onset of abdominal pain, and nausea and vomiting  CT ABDOMEN AND PELVIS WITH CONTRAST  Technique:  Multidetector CT imaging of the abdomen and pelvis was performed following the standard protocol during bolus administration of intravenous contrast.  Contrast: OMNIPAQUE IOHEXOL 300 MG/ML IJ SOLN  Comparison: 07/08/2011  Findings: Mild hepatic steatosis again demonstrated, but no liver lesions identified.  Gallbladder is unremarkable and there is no evidence of biliary ductal dilatation.  The pancreas, spleen, adrenal glands, and kidneys are normal in appearance.  Previous hysterectomy again demonstrated.  No soft tissue masses or lymphadenopathy identified within the abdomen or pelvis.  Moderate diverticulosis again seen involving the descending sigmoid colon, however there is no evidence of diverticulitis.  No other inflammatory process or abnormal fluid collections are identified. No evidence of bowel wall thickening or dilatation.  No evidence of abdominal wall hernia or mass.  IMPRESSION:  1.  Stable exam.  No acute findings. 2.  Diverticulosis.  No radiographic evidence of diverticulitis. 3.   Mild hepatic steatosis.  Original Report Authenticated By: Danae Orleans, M.D.   Dg Abd Acute W/chest  11/13/2011  *RADIOLOGY REPORT*  Clinical Data: Abdominal pain  THREE- VIEW ABDOMINAL SERIES  Comparison:  07/07/2011 and 08/22/2009  Findings: Upper normal heart size.  Clear lungs.  No free intraperitoneal gas in the abdomen.  No disproportionate dilatation of bowel.  Calcifications project over the gluteal regions.  IMPRESSION: No active cardiopulmonary disease.  Nonobstructive bowel gas pattern.  Original Report Authenticated By: Donavan Burnet, M.D.     Disposition  and Follow-up: Discharge Orders    Future Orders Please Complete By Expires   Diet - low sodium heart healthy      Increase activity slowly          DISPOSITION: Home  DIET: Heart healthy diet  ACTIVITY: as tolerated   DISCHARGE FOLLOW-UP Follow-up Information    Follow up with [name]., MD. Schedule an appointment as soon as possible for a visit in 10 days. (for hospital follow up)          Time spent on Discharge: 45 minutes   Signed:  Abdou Stocks M.D. [facility] 11/14/2011, 10:18 AM

## 2011-11-18 LAB — STOOL CULTURE

## 2012-11-18 ENCOUNTER — Ambulatory Visit (INDEPENDENT_AMBULATORY_CARE_PROVIDER_SITE_OTHER): Payer: Medicare Other | Admitting: Family Medicine

## 2012-11-18 VITALS — BP 158/98 | HR 86 | Temp 98.5°F | Resp 16 | Ht 67.0 in | Wt 195.0 lb

## 2012-11-18 DIAGNOSIS — R05 Cough: Secondary | ICD-10-CM

## 2012-11-18 DIAGNOSIS — R300 Dysuria: Secondary | ICD-10-CM

## 2012-11-18 DIAGNOSIS — R350 Frequency of micturition: Secondary | ICD-10-CM

## 2012-11-18 DIAGNOSIS — I2510 Atherosclerotic heart disease of native coronary artery without angina pectoris: Secondary | ICD-10-CM

## 2012-11-18 DIAGNOSIS — R0789 Other chest pain: Secondary | ICD-10-CM

## 2012-11-18 DIAGNOSIS — J019 Acute sinusitis, unspecified: Secondary | ICD-10-CM

## 2012-11-18 LAB — POCT URINALYSIS DIPSTICK
Bilirubin, UA: NEGATIVE
Glucose, UA: NEGATIVE
Ketones, UA: NEGATIVE
Nitrite, UA: NEGATIVE
Protein, UA: NEGATIVE
Spec Grav, UA: 1.01
Urobilinogen, UA: 0.2
pH, UA: 5.5

## 2012-11-18 LAB — POCT UA - MICROSCOPIC ONLY: RBC, urine, microscopic: NEGATIVE

## 2012-11-18 MED ORDER — LEVOFLOXACIN 500 MG PO TABS: 500.0000 mg | ORAL_TABLET | Freq: Every day | ORAL | Status: DC

## 2012-11-18 MED ORDER — BENZONATATE 100 MG PO CAPS: 100.0000 mg | ORAL_CAPSULE | Freq: Three times a day (TID) | ORAL | Status: DC | PRN

## 2012-11-18 NOTE — Progress Notes (Signed)
Subjective:    Patient ID: [name], female    DOB: Apr 25, 1936, 77 y.o.   MRN: [id number]  HPI [name] is a 77 y.o. female  Sinus congestion - sinus headache, pnd, cough and congestion past week to 10 days. No fever.  Tx: coricidin for 2 days, zyrtec, no other cold meds. Clear nasal discharge. Feels rundown - past 3-4 days. Coughing up thick white phlegm at times.  Headache behind eyes.   Treated with antibiotic by Dr. Loreta Ave 4 weeks ago for GI illness. Unknown name, but took for 4 days.   Urinating more frequently past week. burning with urination past few days. No fever.   Hx of CAD - no chest pain - but had a moment this morning where had to catch breath - only lasted a second. Chest feels tight past week with congestion, but no palpitations or chest pain. No hx of MI, but had PTCA in approx.  2007. No radiation of tightness.    Review of Systems  Constitutional: Negative for fever and chills.  HENT: Positive for congestion, postnasal drip and sinus pressure.   Respiratory: Positive for cough and chest tightness (with congestion. ). Negative for shortness of breath.   Cardiovascular: Negative for chest pain.  Gastrointestinal: Positive for abdominal pain. Negative for nausea and vomiting.       Lower abdomen/bladder area  Genitourinary: Positive for dysuria, urgency and frequency. Negative for hematuria, vaginal bleeding, vaginal discharge, difficulty urinating, vaginal pain, menstrual problem and pelvic pain.  Musculoskeletal: Negative for back pain.   And as per hpi.      Objective:   Physical Exam  Vitals reviewed. Constitutional: She is oriented to person, place, and time. She appears well-developed and well-nourished. No distress.  HENT:  Head: Atraumatic. Macrocephalic.  Right Ear: Hearing, tympanic membrane, external ear and ear canal normal.  Left Ear: Hearing, tympanic membrane, external ear and ear canal normal.  Nose: Right sinus exhibits  maxillary sinus tenderness and frontal sinus tenderness. Left sinus exhibits maxillary sinus tenderness. Left sinus exhibits no frontal sinus tenderness.  Mouth/Throat: Oropharynx is clear and moist. No oropharyngeal exudate.  Eyes: Conjunctivae and EOM are normal. Pupils are equal, round, and reactive to light.  Neck: Carotid bruit is not present.  Cardiovascular: Normal rate, regular rhythm, normal heart sounds and intact distal pulses.   No murmur heard. Pulmonary/Chest: Effort normal and breath sounds normal. No respiratory distress. She has no wheezes. She has no rhonchi.  Abdominal: Soft. Normal appearance. She exhibits no distension and no pulsatile midline mass. There is no tenderness. There is no rebound, no guarding and no CVA tenderness.  Neurological: She is alert and oriented to person, place, and time.  Skin: Skin is warm and dry. No rash noted.  Psychiatric: She has a normal mood and affect. Her behavior is normal.    EKG: SR, no apparent significant changes from scanned EKG 07/10/11.  Results for orders placed in visit on 11/18/12  POCT URINALYSIS DIPSTICK      Result Value Range   Color, UA yellow     Clarity, UA clear     Glucose, UA neg     Bilirubin, UA neg     Ketones, UA neg     Spec Grav, UA 1.010     Blood, UA trace-lysed     pH, UA 5.5     Protein, UA neg     Urobilinogen, UA 0.2     Nitrite, UA neg  Leukocytes, UA Trace    POCT UA - MICROSCOPIC ONLY      Result Value Range   WBC, Ur, HPF, POC 0-1     RBC, urine, microscopic neg     Bacteria, U Microscopic neg     Mucus, UA neg     Epithelial cells, urine per micros 0-3     Crystals, Ur, HPF, POC neg     Casts, Ur, LPF, POC neg     Yeast, UA neg          Assessment & Plan:  [name] is a 77 y.o. female Dysuria - Plan: POCT urinalysis dipstick, POCT UA - Microscopic Only, Urine culture, Urinary frequency - Plan: Urine culture, levaquin.  May be related to cold meds.   Sinusitis,  acute - Plan: levofloxacin (LEVAQUIN) 500 MG tablet with PND cough likely.   Possible secondary sickening. Start levaquin (urinary coverage as well),  Tessalon or mucinex if needed for cough. rtc precautions. Most recent creatinine reviewed.   Chest tightness with hx of CAD (coronary artery disease) - Plan: EKG 12-Lead - no apparent changes.  Likely with uri and PND/Cough.  Er/911 CP precautions reviewed.  benzonatate (TESSALON) 100 MG capsule or mucinex as below.   HTN - check outside BP's and recheck if remains elevated.   Patient Instructions  The urine test was borderline abnormal.  This may not be infected, but we will cover with an antibiotic that treats urine infections and sinus infections, and check a urine culture. Sometimes cold medicines can cause urinary symptoms. .  Drink plenty of fluids, and rest.  Saline nasal spray atleast 4 times per day, over the counter mucinex or tessalon for cough. Return to the clinic or go to the nearest emergency room if any of your symptoms worsen or new symptoms occur.   Sinusitis Sinusitis is redness, soreness, and swelling (inflammation) of the paranasal sinuses. Paranasal sinuses are air pockets within the bones of your face (beneath the eyes, the middle of the forehead, or above the eyes). In healthy paranasal sinuses, mucus is able to drain out, and air is able to circulate through them by way of your nose. However, when your paranasal sinuses are inflamed, mucus and air can become trapped. This can allow bacteria and other germs to grow and cause infection. Sinusitis can develop quickly and last only a short time (acute) or continue over a long period (chronic). Sinusitis that lasts for more than 12 weeks is considered chronic.  CAUSES  Causes of sinusitis include:  Allergies.  Structural abnormalities, such as displacement of the cartilage that separates your nostrils (deviated septum), which can decrease the air flow through your nose and sinuses  and affect sinus drainage.  Functional abnormalities, such as when the small hairs (cilia) that line your sinuses and help remove mucus do not work properly or are not present. SYMPTOMS  Symptoms of acute and chronic sinusitis are the same. The primary symptoms are pain and pressure around the affected sinuses. Other symptoms include:  Upper toothache.  Earache.  Headache.  Bad breath.  Decreased sense of smell and taste.  A cough, which worsens when you are lying flat.  Fatigue.  Fever.  Thick drainage from your nose, which often is green and may contain pus (purulent).  Swelling and warmth over the affected sinuses. DIAGNOSIS  Your caregiver will perform a physical exam. During the exam, your caregiver may:  Look in your nose for signs of abnormal growths in your  nostrils (nasal polyps).  Tap over the affected sinus to check for signs of infection.  View the inside of your sinuses (endoscopy) with a special imaging device with a light attached (endoscope), which is inserted into your sinuses. If your caregiver suspects that you have chronic sinusitis, one or more of the following tests may be recommended:  Allergy tests.  Nasal culture A sample of mucus is taken from your nose and sent to a lab and screened for bacteria.  Nasal cytology A sample of mucus is taken from your nose and examined by your caregiver to determine if your sinusitis is related to an allergy. TREATMENT  Most cases of acute sinusitis are related to a viral infection and will resolve on their own within 10 days. Sometimes medicines are prescribed to help relieve symptoms (pain medicine, decongestants, nasal steroid sprays, or saline sprays).  However, for sinusitis related to a bacterial infection, your caregiver will prescribe antibiotic medicines. These are medicines that will help kill the bacteria causing the infection.  Rarely, sinusitis is caused by a fungal infection. In theses cases, your  caregiver will prescribe antifungal medicine. For some cases of chronic sinusitis, surgery is needed. Generally, these are cases in which sinusitis recurs more than 3 times per year, despite other treatments. HOME CARE INSTRUCTIONS   Drink plenty of water. Water helps thin the mucus so your sinuses can drain more easily.  Use a humidifier.  Inhale steam 3 to 4 times a day (for example, sit in the bathroom with the shower running).  Apply a warm, moist washcloth to your face 3 to 4 times a day, or as directed by your caregiver.  Use saline nasal sprays to help moisten and clean your sinuses.  Take over-the-counter or prescription medicines for pain, discomfort, or fever only as directed by your caregiver. SEEK IMMEDIATE MEDICAL CARE IF:  You have increasing pain or severe headaches.  You have nausea, vomiting, or drowsiness.  You have swelling around your face.  You have vision problems.  You have a stiff neck.  You have difficulty breathing. MAKE SURE YOU:   Understand these instructions.  Will watch your condition.  Will get help right away if you are not doing well or get worse. Document Released: 08/19/2005 Document Revised: 11/11/2011 Document Reviewed: 09/03/2011 [facility] Patient Information 2013 [place], [place].    Meds ordered this encounter  Medications  . benzonatate (TESSALON) 100 MG capsule    Sig: Take 1 capsule (100 mg total) by mouth 3 (three) times daily as needed for cough.    Dispense:  20 capsule    Refill:  0  . levofloxacin (LEVAQUIN) 500 MG tablet    Sig: Take 1 tablet (500 mg total) by mouth daily.    Dispense:  10 tablet    Refill:  0

## 2012-11-18 NOTE — Patient Instructions (Signed)
The urine test was borderline abnormal.  This may not be infected, but we will cover with an antibiotic that treats urine infections and sinus infections, and check a urine culture. Sometimes cold medicines can cause urinary symptoms. .  Drink plenty of fluids, and rest.  Saline nasal spray atleast 4 times per day, over the counter mucinex or tessalon for cough. Return to the clinic or go to the nearest emergency room if any of your symptoms worsen or new symptoms occur.   Sinusitis Sinusitis is redness, soreness, and swelling (inflammation) of the paranasal sinuses. Paranasal sinuses are air pockets within the bones of your face (beneath the eyes, the middle of the forehead, or above the eyes). In healthy paranasal sinuses, mucus is able to drain out, and air is able to circulate through them by way of your nose. However, when your paranasal sinuses are inflamed, mucus and air can become trapped. This can allow bacteria and other germs to grow and cause infection. Sinusitis can develop quickly and last only a short time (acute) or continue over a long period (chronic). Sinusitis that lasts for more than 12 weeks is considered chronic.  CAUSES  Causes of sinusitis include:  Allergies.  Structural abnormalities, such as displacement of the cartilage that separates your nostrils (deviated septum), which can decrease the air flow through your nose and sinuses and affect sinus drainage.  Functional abnormalities, such as when the small hairs (cilia) that line your sinuses and help remove mucus do not work properly or are not present. SYMPTOMS  Symptoms of acute and chronic sinusitis are the same. The primary symptoms are pain and pressure around the affected sinuses. Other symptoms include:  Upper toothache.  Earache.  Headache.  Bad breath.  Decreased sense of smell and taste.  A cough, which worsens when you are lying flat.  Fatigue.  Fever.  Thick drainage from your nose, which often  is green and may contain pus (purulent).  Swelling and warmth over the affected sinuses. DIAGNOSIS  Your caregiver will perform a physical exam. During the exam, your caregiver may:  Look in your nose for signs of abnormal growths in your nostrils (nasal polyps).  Tap over the affected sinus to check for signs of infection.  View the inside of your sinuses (endoscopy) with a special imaging device with a light attached (endoscope), which is inserted into your sinuses. If your caregiver suspects that you have chronic sinusitis, one or more of the following tests may be recommended:  Allergy tests.  Nasal culture A sample of mucus is taken from your nose and sent to a lab and screened for bacteria.  Nasal cytology A sample of mucus is taken from your nose and examined by your caregiver to determine if your sinusitis is related to an allergy. TREATMENT  Most cases of acute sinusitis are related to a viral infection and will resolve on their own within 10 days. Sometimes medicines are prescribed to help relieve symptoms (pain medicine, decongestants, nasal steroid sprays, or saline sprays).  However, for sinusitis related to a bacterial infection, your caregiver will prescribe antibiotic medicines. These are medicines that will help kill the bacteria causing the infection.  Rarely, sinusitis is caused by a fungal infection. In theses cases, your caregiver will prescribe antifungal medicine. For some cases of chronic sinusitis, surgery is needed. Generally, these are cases in which sinusitis recurs more than 3 times per year, despite other treatments. HOME CARE INSTRUCTIONS   Drink plenty of water. Water helps  thin the mucus so your sinuses can drain more easily.  Use a humidifier.  Inhale steam 3 to 4 times a day (for example, sit in the bathroom with the shower running).  Apply a warm, moist washcloth to your face 3 to 4 times a day, or as directed by your caregiver.  Use saline nasal  sprays to help moisten and clean your sinuses.  Take over-the-counter or prescription medicines for pain, discomfort, or fever only as directed by your caregiver. SEEK IMMEDIATE MEDICAL CARE IF:  You have increasing pain or severe headaches.  You have nausea, vomiting, or drowsiness.  You have swelling around your face.  You have vision problems.  You have a stiff neck.  You have difficulty breathing. MAKE SURE YOU:   Understand these instructions.  Will watch your condition.  Will get help right away if you are not doing well or get worse. Document Released: 08/19/2005 Document Revised: 11/11/2011 Document Reviewed: 09/03/2011 [facility] Patient Information 2013 [place], [place].

## 2012-11-21 LAB — URINE CULTURE: Colony Count: 80000

## 2013-01-27 ENCOUNTER — Emergency Department (HOSPITAL_COMMUNITY): Payer: Medicare Other

## 2013-01-27 ENCOUNTER — Encounter (HOSPITAL_COMMUNITY)
Admission: EM | Disposition: A | Discharge status: Home / Self Care | Payer: Self-pay | Source: Home / Self Care | Attending: Interventional Cardiology

## 2013-01-27 ENCOUNTER — Inpatient Hospital Stay (HOSPITAL_COMMUNITY)
Admission: EM | Admit: 2013-01-27 | Discharge: 2013-01-28 | DRG: 247 | Disposition: A | Discharge status: Home / Self Care | Payer: Medicare Other | Attending: Interventional Cardiology | Admitting: Interventional Cardiology

## 2013-01-27 ENCOUNTER — Encounter (HOSPITAL_COMMUNITY): Payer: Self-pay | Admitting: *Deleted

## 2013-01-27 DIAGNOSIS — I2129 ST elevation (STEMI) myocardial infarction involving other sites: Principal | ICD-10-CM

## 2013-01-27 DIAGNOSIS — I219 Acute myocardial infarction, unspecified: Secondary | ICD-10-CM

## 2013-01-27 DIAGNOSIS — Z79899 Other long term (current) drug therapy: Secondary | ICD-10-CM

## 2013-01-27 DIAGNOSIS — I10 Essential (primary) hypertension: Secondary | ICD-10-CM | POA: Diagnosis present

## 2013-01-27 DIAGNOSIS — Z87891 Personal history of nicotine dependence: Secondary | ICD-10-CM

## 2013-01-27 DIAGNOSIS — I4891 Unspecified atrial fibrillation: Secondary | ICD-10-CM | POA: Diagnosis present

## 2013-01-27 DIAGNOSIS — Z780 Asymptomatic menopausal state: Secondary | ICD-10-CM

## 2013-01-27 DIAGNOSIS — Z9861 Coronary angioplasty status: Secondary | ICD-10-CM

## 2013-01-27 DIAGNOSIS — Z955 Presence of coronary angioplasty implant and graft: Secondary | ICD-10-CM

## 2013-01-27 DIAGNOSIS — Z7982 Long term (current) use of aspirin: Secondary | ICD-10-CM

## 2013-01-27 DIAGNOSIS — E785 Hyperlipidemia, unspecified: Secondary | ICD-10-CM

## 2013-01-27 DIAGNOSIS — R42 Dizziness and giddiness: Secondary | ICD-10-CM

## 2013-01-27 DIAGNOSIS — I252 Old myocardial infarction: Secondary | ICD-10-CM | POA: Diagnosis present

## 2013-01-27 DIAGNOSIS — IMO0001 Reserved for inherently not codable concepts without codable children: Secondary | ICD-10-CM | POA: Diagnosis present

## 2013-01-27 DIAGNOSIS — I2510 Atherosclerotic heart disease of native coronary artery without angina pectoris: Secondary | ICD-10-CM | POA: Diagnosis present

## 2013-01-27 DIAGNOSIS — Z7902 Long term (current) use of antithrombotics/antiplatelets: Secondary | ICD-10-CM

## 2013-01-27 DIAGNOSIS — F411 Generalized anxiety disorder: Secondary | ICD-10-CM | POA: Diagnosis present

## 2013-01-27 HISTORY — DX: Personal history of peptic ulcer disease: Z87.11

## 2013-01-27 HISTORY — DX: Dizziness and giddiness: R42

## 2013-01-27 HISTORY — DX: Unspecified osteoarthritis, unspecified site: M19.90

## 2013-01-27 HISTORY — DX: Gastro-esophageal reflux disease without esophagitis: K21.9

## 2013-01-27 HISTORY — DX: Cardiomegaly: I51.7

## 2013-01-27 HISTORY — DX: Age-related osteoporosis without current pathological fracture: M81.0

## 2013-01-27 HISTORY — DX: Personal history of other diseases of the digestive system: Z87.19

## 2013-01-27 HISTORY — PX: LEFT HEART CATHETERIZATION WITH CORONARY ANGIOGRAM: SHX5451

## 2013-01-27 HISTORY — DX: Pure hypercholesterolemia, unspecified: E78.00

## 2013-01-27 LAB — COMPREHENSIVE METABOLIC PANEL
ALT: 16 U/L (ref 0–35)
Albumin: 3.4 g/dL — ABNORMAL LOW (ref 3.5–5.2)
Alkaline Phosphatase: 74 U/L (ref 39–117)
BUN: 12 mg/dL (ref 6–23)
Chloride: 102 mEq/L (ref 96–112)
Potassium: 3 mEq/L — ABNORMAL LOW (ref 3.5–5.1)
Sodium: 141 mEq/L (ref 135–145)
Total Bilirubin: 0.3 mg/dL (ref 0.3–1.2)

## 2013-01-27 LAB — CBC WITH DIFFERENTIAL/PLATELET
Basophils Absolute: 0 10*3/uL (ref 0.0–0.1)
Basophils Relative: 0 % (ref 0–1)
HCT: 39.6 % (ref 36.0–46.0)
Hemoglobin: 12.9 g/dL (ref 12.0–15.0)
Lymphocytes Relative: 63 % — ABNORMAL HIGH (ref 12–46)
Monocytes Relative: 5 % (ref 3–12)
Neutro Abs: 3 10*3/uL (ref 1.7–7.7)
WBC: 10.1 10*3/uL (ref 4.0–10.5)

## 2013-01-27 LAB — PRO B NATRIURETIC PEPTIDE: Pro B Natriuretic peptide (BNP): 48.6 pg/mL (ref 0–125)

## 2013-01-27 SURGERY — LEFT HEART CATHETERIZATION WITH CORONARY ANGIOGRAM
Anesthesia: LOCAL

## 2013-01-27 SURGERY — LEFT HEART CATHETERIZATION WITH CORONARY ANGIOGRAM
Anesthesia: Moderate Sedation

## 2013-01-27 MED ORDER — ONDANSETRON HCL 4 MG/2ML IJ SOLN: 4.0000 mg | Freq: Four times a day (QID) | INTRAMUSCULAR | Status: DC | PRN

## 2013-01-27 MED ORDER — POTASSIUM CHLORIDE IN NACL 20-0.9 MEQ/L-% IV SOLN
INTRAVENOUS | Status: AC
  Administered 2013-01-27: 13:00:00 via INTRAVENOUS
  Filled 2013-01-27: qty 1000

## 2013-01-27 MED ORDER — MIDAZOLAM HCL 2 MG/2ML IJ SOLN
INTRAMUSCULAR | Status: AC
  Filled 2013-01-27: qty 2

## 2013-01-27 MED ORDER — METOPROLOL TARTRATE 1 MG/ML IV SOLN
INTRAVENOUS | Status: AC
  Filled 2013-01-27: qty 5

## 2013-01-27 MED ORDER — IRBESARTAN 150 MG PO TABS
150.0000 mg | ORAL_TABLET | Freq: Every day | ORAL | Status: DC
  Administered 2013-01-28: 07:00:00 150 mg via ORAL
  Filled 2013-01-27 (×2): qty 1

## 2013-01-27 MED ORDER — NITROGLYCERIN IN D5W 200-5 MCG/ML-% IV SOLN
10.0000 ug/min | INTRAVENOUS | Status: DC
  Administered 2013-01-27: 5 ug/min via INTRAVENOUS
  Filled 2013-01-27: qty 250

## 2013-01-27 MED ORDER — POTASSIUM CHLORIDE 10 MEQ/100ML IV SOLN
INTRAVENOUS | Status: AC
  Filled 2013-01-27: qty 100

## 2013-01-27 MED ORDER — ALPRAZOLAM 0.25 MG PO TABS
0.2500 mg | ORAL_TABLET | Freq: Three times a day (TID) | ORAL | Status: DC | PRN
  Administered 2013-01-27: 20:00:00 0.25 mg via ORAL
  Filled 2013-01-27: qty 1

## 2013-01-27 MED ORDER — LORATADINE 10 MG PO TABS
10.0000 mg | ORAL_TABLET | Freq: Every day | ORAL | Status: DC | PRN
  Filled 2013-01-27: qty 1

## 2013-01-27 MED ORDER — TICAGRELOR 90 MG PO TABS
90.0000 mg | ORAL_TABLET | Freq: Two times a day (BID) | ORAL | Status: DC
  Administered 2013-01-27 – 2013-01-28 (×2): 90 mg via ORAL
  Filled 2013-01-27 (×4): qty 1

## 2013-01-27 MED ORDER — ONDANSETRON HCL 4 MG/2ML IJ SOLN
INTRAMUSCULAR | Status: AC
  Administered 2013-01-27: 4 mg via INTRAVENOUS
  Filled 2013-01-27: qty 2

## 2013-01-27 MED ORDER — ONDANSETRON HCL 4 MG/2ML IJ SOLN
INTRAMUSCULAR | Status: AC
  Filled 2013-01-27: qty 2

## 2013-01-27 MED ORDER — HEPARIN BOLUS VIA INFUSION: 4000.0000 [IU] | Freq: Once | INTRAVENOUS | Status: DC

## 2013-01-27 MED ORDER — MORPHINE SULFATE 2 MG/ML IJ SOLN
2.0000 mg | INTRAMUSCULAR | Status: DC | PRN
  Administered 2013-01-27: 2 mg via INTRAVENOUS
  Filled 2013-01-27: qty 1

## 2013-01-27 MED ORDER — SODIUM CHLORIDE 0.9 % IV SOLN
Freq: Once | INTRAVENOUS | Status: AC
  Administered 2013-01-27: 09:00:00 via INTRAVENOUS

## 2013-01-27 MED ORDER — ASPIRIN EC 81 MG PO TBEC
81.0000 mg | DELAYED_RELEASE_TABLET | Freq: Every day | ORAL | Status: DC
  Administered 2013-01-28: 10:00:00 81 mg via ORAL
  Filled 2013-01-27 (×2): qty 1

## 2013-01-27 MED ORDER — SODIUM CHLORIDE 0.9 % IV SOLN
0.2500 mg/kg/h | INTRAVENOUS | Status: AC
  Filled 2013-01-27: qty 250

## 2013-01-27 MED ORDER — SODIUM CHLORIDE 0.9 % IV SOLN: 0.2500 mg/kg/h | INTRAVENOUS | Status: DC

## 2013-01-27 MED ORDER — ACETAMINOPHEN 325 MG PO TABS
650.0000 mg | ORAL_TABLET | ORAL | Status: DC | PRN
  Administered 2013-01-27: 650 mg via ORAL
  Filled 2013-01-27: qty 2

## 2013-01-27 MED ORDER — FENTANYL CITRATE 0.05 MG/ML IJ SOLN
INTRAMUSCULAR | Status: AC
  Filled 2013-01-27: qty 2

## 2013-01-27 MED ORDER — POTASSIUM CHLORIDE CRYS ER 20 MEQ PO TBCR
20.0000 meq | EXTENDED_RELEASE_TABLET | Freq: Every day | ORAL | Status: DC
  Administered 2013-01-27 – 2013-01-28 (×2): 20 meq via ORAL
  Filled 2013-01-27 (×2): qty 1

## 2013-01-27 MED ORDER — ALBUTEROL SULFATE HFA 108 (90 BASE) MCG/ACT IN AERS
2.0000 | INHALATION_SPRAY | Freq: Four times a day (QID) | RESPIRATORY_TRACT | Status: DC | PRN
  Filled 2013-01-27: qty 6.7

## 2013-01-27 MED ORDER — TICAGRELOR 90 MG PO TABS
ORAL_TABLET | ORAL | Status: AC
  Filled 2013-01-27: qty 2

## 2013-01-27 MED ORDER — ONDANSETRON HCL 4 MG/2ML IJ SOLN: 4.0000 mg | Freq: Once | INTRAMUSCULAR | Status: AC

## 2013-01-27 MED ORDER — SODIUM CHLORIDE 0.9 % IV BOLUS (SEPSIS)
500.0000 mL | Freq: Once | INTRAVENOUS | Status: AC
  Administered 2013-01-27: 500 mL via INTRAVENOUS

## 2013-01-27 MED ORDER — HEPARIN (PORCINE) IN NACL 100-0.45 UNIT/ML-% IJ SOLN
1100.0000 [IU]/h | INTRAMUSCULAR | Status: DC
  Filled 2013-01-27: qty 250

## 2013-01-27 MED ORDER — LIDOCAINE HCL (PF) 1 % IJ SOLN
INTRAMUSCULAR | Status: AC
  Filled 2013-01-27: qty 30

## 2013-01-27 MED ORDER — BIVALIRUDIN 250 MG IV SOLR
INTRAVENOUS | Status: AC
  Filled 2013-01-27: qty 250

## 2013-01-27 MED ORDER — FLUTICASONE PROPIONATE 50 MCG/ACT NA SUSP
2.0000 | Freq: Every day | NASAL | Status: DC | PRN
  Filled 2013-01-27: qty 16

## 2013-01-27 MED ORDER — SODIUM CHLORIDE 0.9 % IV SOLN: INTRAVENOUS | Status: DC

## 2013-01-27 MED ORDER — NITROGLYCERIN 0.4 MG SL SUBL
0.4000 mg | SUBLINGUAL_TABLET | SUBLINGUAL | Status: DC | PRN
  Administered 2013-01-27: 0.4 mg via SUBLINGUAL

## 2013-01-27 MED ORDER — MORPHINE SULFATE 4 MG/ML IJ SOLN
4.0000 mg | Freq: Once | INTRAMUSCULAR | Status: AC
  Administered 2013-01-27: 4 mg via INTRAVENOUS
  Filled 2013-01-27: qty 1

## 2013-01-27 MED ORDER — HEPARIN (PORCINE) IN NACL 2-0.9 UNIT/ML-% IJ SOLN
INTRAMUSCULAR | Status: AC
  Filled 2013-01-27: qty 1500

## 2013-01-27 MED ORDER — GABAPENTIN 300 MG PO CAPS
300.0000 mg | ORAL_CAPSULE | Freq: Two times a day (BID) | ORAL | Status: DC
  Administered 2013-01-27 – 2013-01-28 (×3): 300 mg via ORAL
  Filled 2013-01-27 (×4): qty 1

## 2013-01-27 MED ORDER — ASPIRIN 81 MG PO CHEW: 81.0000 mg | CHEWABLE_TABLET | Freq: Every day | ORAL | Status: DC

## 2013-01-27 NOTE — ED Notes (Addendum)
Consent obtained. To cath lab

## 2013-01-27 NOTE — ED Notes (Signed)
C/o midsternal CP onset upon awakening this morning. Pain radiates into neck & BUE, +SOB, nausea no emesis, diaphoresis. States same pain when had stents placed in 2006. Given ASA 324mg  by EMS

## 2013-01-27 NOTE — CV Procedure (Signed)
PROCEDURE:  Left heart catheterization with selective coronary angiography, left ventriculogram. PCI Left circumflex  INDICATIONS:  NSTEMI  The risks, benefits, and details of the procedure were explained to the patient.  The patient verbalized understanding and wanted to proceed.  Informed written consent was obtained.  PROCEDURE TECHNIQUE:  After Xylocaine anesthesia a 23F sheath was placed in the right femoral artery with a single anterior needle wall stick.   Left coronary angiography was done using a Judkins L4 guide catheter.  Right coronary angiography was done using a Judkins R4 guide catheter.  Left ventriculography was done using a pigtail catheter.    CONTRAST:  Total of 140 cc.  COMPLICATIONS:  None.    HEMODYNAMICS:  Aortic pressure was 130/66; LV pressure was 132/2; LVEDP 5 mm Hg.  There was no gradient between the left ventricle and aorta.    ANGIOGRAPHIC DATA:   The left main coronary artery is widely patent.  The left anterior descending artery is a large vessel which wraps around the apex.  There are mild luminal irregularities in the proximal and mid vessel.  The first diagonal is a medium sized vessel which is widely patent.  The second diagonal is small but patent.  The mid vessel stent is patent with only mild in-stent restenosis.  The left circumflex artery is a large vessel.  There is a 50% ostial lesion.  In the mid vessel, there is a focal 95% stenosis.  This occurs just before the origin of a large first obtuse marginal branch.  The remainder of the circumflex continues without significant disease.  There is a medium size second obtuse marginal branch which appears widely patent.  The right coronary artery is a large dominant vessel.  There is an ostial 60% stenosis with catheter damping with the 23Fr catheter, when it is deeply engaged.  Visually, the stenosis appears similar to prior catheterization in 2009.  In the mid vessel, there is a 25% lesion which is new from  the previous cath.  The posterior descending artery is medium-sized and widely patent.  The posterior lateral artery is large and patent.  LEFT VENTRICULOGRAM:  Left ventricular angiogram was done in the 30 RAO projection and revealed normal left ventricular wall motion and systolic function with an estimated ejection fraction of 60%.  LVEDP was 5 mmHg.  PCI NARRATIVE: A CLS 3.0 guiding catheters used to engage the left main.  A pro-water wire was placed across the area of disease in the circumflex after Angiomax was administered.  An ACT was used to check that the Angiomax is therapeutic.  A 2.5 x 12 balloon was used to predilate the lesion.  A 2.75 x 12 Promus drug-eluting stent was placed across the area of disease, and with the distal edge just before the bifurcation of the OM1, and deployed.  The stent was postdilated with a 3.25 x 8 noncompliant balloon.  There is an excellent angiographic result.  There is no residual stenosis.  IMPRESSIONS:  1. Normal left main coronary artery. 2. Mild atherosclerosis in the left anterior descending artery and its branches.  Patent mid vessel stent. 3. 95% mid left circumflex artery stenosis.  Successful 2.75 x 12 Promus DES placement, postdilated to 3.3 mm in diameter. 4. Ostial 50-60% stenosis in the right coronary artery.  Mild disease in the mid vessel. 5. Normal left ventricular systolic function.  LVEDP 5 mmHg.  Ejection fraction 60 %.  RECOMMENDATION:  He'll need dual antiplatelet therapy for at least a year.  Will use Brilinta for now along with low-dose aspirin.  She will need to be on a statin.  We'll also start beta blocker.  She did receive some IV metoprolol just after the catheterization.  The lesion was likely closed at times causing severe pain.  We'll check troponin in the morning.  I suspect this will be elevated.  Continue aggressive secondary prevention and consider cardiac rehabilitation after discharge.

## 2013-01-27 NOTE — ED Notes (Addendum)
Cardiologist MD at bedside. Aware of sudden increase in CP. Will repeat EKG

## 2013-01-27 NOTE — H&P (Signed)
Admit date: 01/27/2013 Referring Physician Dr. Oletta Cohn Primary Cardiologist Dr. Mendel Ryder Chief complaint/reason for admission: chest discomfort  HPI: 77 y/o with CAD who had some CP last night that resolved.  She woke up this morning and had significant discomfort just with walking to the bathroom.  She called EMS and came to the hospital.  There was some discomfort which was decreased with subungual nitroglycerin.  It had not completely gone away.  We're called to evaluate her for admission and further ischemia workup.  Of note, she had an LAD stent placed several years ago.  She has had borderline disease in the ostium of her right coronary artery as well.  As I walked into the room to see her, she was moaning due to severe pain in her chest.  She describes the pain as a 10 out of 10.  She is having some nausea as well.  She is dry heaving.  She is being comforted by her husband.  She states that the pain is very severe.  It is the same type of pain that she had prior to her LAD stent but it is much more severe.  I instructed the nurse to up titrate her nitroglycerin and give intravenous morphine.  With these interventions, her pain is somewhat better but still severe.  Her blood pressure is stable despite the nitroglycerin.  She has not had any arrhythmias.  It is noted on the monitor that her ST segments are depressed while her pain is increased.  Repeat ECG showed more pronounced ST depression in the anterior and lateral leads while she is having severe pain.    PMH:    Past Medical History  Diagnosis Date  . Coronary artery disease   . Hypertension   . Anxiety   . Fibromyalgia   . Dysrhythmia     history of "irregular heart rate"'; pt. thinks it was A-fib    PSH:    Past Surgical History  Procedure Laterality Date  . Breast surgery      right benign lumpectomy  . Diagnostic laparoscopy      lap abdominal lysis of adhesions  . Cardiac catheterization      1 stent  . Coronary  angioplasty    . Abdominal hysterectomy      and separate oopherectomies  . Hernia repair      ALLERGIES:   Review of patient's allergies indicates no known allergies.  Prior to Admit Meds:   (Not in a hospital admission) Family HX:   No family history on file. Social HX:    History   Social History  . Marital Status: Married    Spouse Name: N/A    Number of Children: N/A  . Years of Education: N/A   Occupational History  . Not on file.   Social History Main Topics  . Smoking status: Former Games developer  . Smokeless tobacco: Never Used  . Alcohol Use: No  . Drug Use: No  . Sexually Active: Not Currently    Birth Control/ Protection: Post-menopausal   Other Topics Concern  . Not on file   Social History Narrative  . No narrative on file     ROS:  All 11 ROS were addressed and are negative except what is stated in the HPI  PHYSICAL EXAM Filed Vitals:   01/27/13 1045  BP: 140/91  Pulse: 77  Temp:   Resp: 25   General: Appears in significant distress and is grabbing her chest Head:Normal cephalic and atramatic  Lungs:   Clear bilaterally to auscultation and percussion. Heart:   HRRR S1 S2  Abdomen: Bowel sounds are positive, abdomen soft and non-tender without masses or                  Hernia's noted. Msk:  Back normal, normal gait. Normal strength and tone for age. Extremities:   No edema.  DP 2+ Neuro: Alert and oriented X 3. Psych:  Anxious, responds appropriately   Labs:   Lab Results  Component Value Date   WBC 10.1 01/27/2013   HGB 12.9 01/27/2013   HCT 39.6 01/27/2013   MCV 83.9 01/27/2013   PLT 251 01/27/2013    Recent Labs Lab 01/27/13 0820  NA 141  K 3.0*  CL 102  CO2 22  BUN 12  CREATININE 0.84  CALCIUM 9.2  PROT 6.8  BILITOT 0.3  ALKPHOS 74  ALT 16  AST 20  GLUCOSE 152*   Lab Results  Component Value Date   CKTOTAL 127 07/07/2011   CKMB 2.7 07/07/2011   TROPONINI <0.30 01/27/2013   No results found for this basename: PTT   Lab  Results  Component Value Date   INR 0.92 04/24/2011   INR 1.0 04/17/2009   INR 1.0 02/29/2008     Lab Results  Component Value Date   CHOL  Value: 271        ATP III CLASSIFICATION:  <200     mg/dL   Desirable  086-578  mg/dL   Borderline High  >=469    mg/dL   High       * 03/01/5283   CHOL  Value: 160        ATP III CLASSIFICATION:  <200     mg/dL   Desirable  132-440  mg/dL   Borderline High  >=102    mg/dL   High 03/27/3663   Lab Results  Component Value Date   HDL 42 04/18/2009   HDL 41 12/03/4740   Lab Results  Component Value Date   LDLCALC  Value: 206        Total Cholesterol/HDL:CHD Risk Coronary Heart Disease Risk Table                     Men   Women  1/2 Average Risk   3.4   3.3  Average Risk       5.0   4.4  2 X Average Risk   9.6   7.1  3 X Average Risk  23.4   11.0        Use the calculated Patient Ratio above and the CHD Risk Table to determine the patient's CHD Risk.        ATP III CLASSIFICATION (LDL):  <100     mg/dL   Optimal  595-638  mg/dL   Near or Above                    Optimal  130-159  mg/dL   Borderline  756-433  mg/dL   High  >295     mg/dL   Very High* 1/88/4166   LDLCALC  Value: 107        Total Cholesterol/HDL:CHD Risk Coronary Heart Disease Risk Table                     Men   Women  1/2 Average Risk   3.4   3.3* 02/26/2008   Lab Results  Component Value  Date   TRIG 113 04/18/2009   TRIG 60 02/26/2008   Lab Results  Component Value Date   CHOLHDL 6.5 04/18/2009   CHOLHDL 3.9 02/26/2008   No results found for this basename: LDLDIRECT      Radiology:  @RISRSLT24 @  EKG:  NSR, anterior, lateral ST depression, T wave inversions  ASSESSMENT: Non-ST elevation MI.  ECG findings concerning for acute ischemia.    PLAN:  Plan for cath.  Further plans based on cath findings.   I suspect she'll have severe disease in her circumflex, since we are not seeing any acute ST elevation.  SHe likely has an occlusion.  It may be transiently opening which is why she has some  relief of her pain.  I had instructed the ER physician to start IV heparin.  Continue IV nitroglycerin and morphine for now.  I Have instructed  the cardiac cath lab to bring her upstairs for emergent cath as soon as possible.  They will do so when a room opens up.    Of note, she had a rash with Plavix in the past.  We'll use a newer antiplatelet agent.  Start statin.  She will likely need a beta blocker as well.  Critical care time 45 minutes.   Corky Crafts., MD  01/27/2013  11:08 AM

## 2013-01-27 NOTE — ED Provider Notes (Addendum)
History     CSN: [id number]  Arrival date & time 01/27/13  0818   First MD Initiated Contact with Patient 01/27/13 [phone]      Chief Complaint  Patient presents with  . Chest Pain    (Consider location/radiation/quality/duration/timing/severity/associated sxs/prior treatment) HPI Comments: Patient presents to ER with complaints of severe substernal chest pain that began this morning. Patient reports that she had slight tightness in her chest last night before bedtime, but resolved and she was able to go to sleep. Upon awakening this morning, getting out of bed and started to walk around her house she had onset of more severe pain in the center of her chest. The pain radiates to the neck and bilateral upper extremities. Patient reports that this feels similar to the pain she had prior to his stenting in 2006. Patient experiencing nausea without vomiting. She is short of breath. He was administered aspirin by EMS during transport. Were unable to establish an IV, and therefore no nitroglycerin given.  Patient is a 77 y.o. female presenting with chest pain.  Chest Pain Associated symptoms: nausea and shortness of breath     Past Medical History  Diagnosis Date  . Coronary artery disease   . Hypertension   . Anxiety   . Fibromyalgia   . Dysrhythmia     history of "irregular heart rate"'; pt. thinks it was A-fib    Past Surgical History  Procedure Laterality Date  . Breast surgery      right benign lumpectomy  . Diagnostic laparoscopy      lap abdominal lysis of adhesions  . Cardiac catheterization      1 stent  . Coronary angioplasty    . Abdominal hysterectomy      and separate oopherectomies  . Hernia repair      No family history on file.  History  Substance Use Topics  . Smoking status: Former Games developer  . Smokeless tobacco: Never Used  . Alcohol Use: No    OB History   Grav Para Term Preterm Abortions TAB SAB Ect Mult Living                  Review of Systems   Respiratory: Positive for shortness of breath.   Cardiovascular: Positive for chest pain.  Gastrointestinal: Positive for nausea.  All other systems reviewed and are negative.    Allergies  Review of patient's allergies indicates no known allergies.  Home Medications   Current Outpatient Rx  Name  Route  Sig  Dispense  Refill  . ALPRAZolam (XANAX) 0.25 MG tablet   Oral   Take 0.25 mg by mouth 3 (three) times daily as needed. anxiety         . aspirin EC 81 MG tablet   Oral   Take 81 mg by mouth daily.           . benzonatate (TESSALON) 100 MG capsule   Oral   Take 1 capsule (100 mg total) by mouth 3 (three) times daily as needed for cough.   20 capsule   0   . furosemide (LASIX) 20 MG tablet   Oral   Take 10 mg by mouth daily.          [name] gabapentin (NEURONTIN) 300 MG capsule   Oral   Take 300 mg by mouth 2 (two) times daily.           [name] EXPIRED: hydrALAZINE (APRESOLINE) 25 MG tablet   Oral   Take  1 tablet (25 mg total) by mouth 3 (three) times daily.   90 tablet   1   . levofloxacin (LEVAQUIN) 500 MG tablet   Oral   Take 1 tablet (500 mg total) by mouth daily.   10 tablet   0   . loratadine (CLARITIN) 10 MG tablet   Oral   Take 10 mg by mouth daily as needed. allergies          . meclizine (ANTIVERT) 12.5 MG tablet   Oral   Take 12.5 mg by mouth 3 (three) times daily as needed. For dizziness         . potassium chloride SA (K-DUR,KLOR-CON) 20 MEQ tablet   Oral   Take 20 mEq by mouth daily.         . Probiotic Product (ALIGN) 4 MG CAPS   Oral   Take 1 capsule by mouth daily.         . valsartan (DIOVAN) 160 MG tablet   Oral   Take 160 mg by mouth daily.             BP 149/88  Pulse 69  Temp(Src) 98.1 F (36.7 C) (Oral)  Resp 23  SpO2 100%  Physical Exam  Constitutional: She is oriented to person, place, and time. She appears well-developed and well-nourished. She appears distressed.  HENT:  Head: Normocephalic and  atraumatic.  Right Ear: Hearing normal.  Left Ear: Hearing normal.  Nose: Nose normal.  Mouth/Throat: Oropharynx is clear and moist and mucous membranes are normal.  Eyes: Conjunctivae and EOM are normal. Pupils are equal, round, and reactive to light.  Neck: Normal range of motion. Neck supple.  Cardiovascular: Regular rhythm, S1 normal and S2 normal.  Exam reveals no gallop and no friction rub.   No murmur heard. Pulmonary/Chest: Effort normal and breath sounds normal. No respiratory distress. She exhibits no tenderness.  Abdominal: Soft. Normal appearance and bowel sounds are normal. There is no hepatosplenomegaly. There is no tenderness. There is no rebound, no guarding, no tenderness at McBurney's point and negative Murphy's sign. No hernia.  Musculoskeletal: Normal range of motion.  Neurological: She is alert and oriented to person, place, and time. She has normal strength. No cranial nerve deficit or sensory deficit. Coordination normal. GCS eye subscore is 4. GCS verbal subscore is 5. GCS motor subscore is 6.  Skin: Skin is warm, dry and intact. No rash noted. No cyanosis.  Psychiatric: She has a normal mood and affect. Her speech is normal and behavior is normal. Thought content normal.    ED Course  Procedures (including critical care time)  EKG #1:  Date: 01/27/2013  Rate: 74  Rhythm: normal sinus rhythm  QRS Axis: normal  Intervals: normal  ST/T Wave abnormalities: ST depressions inferiorly, ST depressions laterally and T wave inversion anteriorly  Conduction Disutrbances:none  Narrative Interpretation:   Old EKG Reviewed: changes noted and inferior and lateral ST depressions are new, anterior T wave inversions are new  EKG #2:  Date: 01/27/2013  Rate: 72  Rhythm: normal sinus rhythm  QRS Axis: normal  Intervals: normal  ST/T Wave abnormalities: nonspecific T wave changes  Conduction Disutrbances:none  Narrative Interpretation:   Old EKG Reviewed: ST depressions  improved      Labs Reviewed  COMPREHENSIVE METABOLIC PANEL - Abnormal; Notable for the following:    Potassium 3.0 (*)    Glucose, Bld 152 (*)    Albumin 3.4 (*)    GFR calc non Af  Amer 67 (*)    GFR calc Af Amer 77 (*)    All other components within normal limits  CBC WITH DIFFERENTIAL  PRO B NATRIURETIC PEPTIDE  TROPONIN I  POCT I-STAT TROPONIN I   Dg Chest Port 1 View  01/27/2013   *RADIOLOGY REPORT*  Clinical Data: Chest pain, shortness of breath, vomiting  PORTABLE CHEST - 1 VIEW  Comparison: 07/07/2011  Findings: Normal heart size and vascularity.  Minor basilar atelectasis as before.  No CHF, pneumonia, effusion or pneumothorax.  Trachea midline.  Degenerative changes of the spine.  IMPRESSION: Bibasilar atelectasis.   Original Report Authenticated By: Judie Petit. Miles Costain, M.D.     Diagnosis: Unstable angina    MDM  Presents to ER for evaluation of chest pain. She had slight chest pain last night which spontaneously resolved. This morning she had more significant pain and ferrous like when she had her cardiac problems in 2006. At that time she had stenting of the LAD. She has not had any further cardiac problems since then. Patient was in a great deal of distress on arrival, pain radiating to the neck and arms. Symptoms improved with nitroglycerin. Her admission EKG showed inferior ST depressions, T-wave abnormality and possible depressions laterally. There are T wave inversions anteriorly. These are new from her old EKG. After she became pain-free, ST depressions in the inferior and lateral leads resolved, still has diffuse T-wave abnormalities and inversions. Patient will be discussed with cardiology for admission and further workup.   10:45 Addendum: Patient now started to complain of increasing pain. Nitroglycerin has been increased. Heparin to be initiated. Patient administered morphine and Zofran for the pain.     Gilda Crease, MD 01/27/13 4401  Gilda Crease, MD 01/27/13 1045

## 2013-01-27 NOTE — ED Notes (Signed)
Pt c/o worsening left side CP while at rest, increased NTG gtt. Reports pain worse with mvmt & palpation. ED MD informed & aware

## 2013-01-27 NOTE — Progress Notes (Signed)
ANTICOAGULATION CONSULT NOTE - Initial Consult  Pharmacy Consult for heparin Indication: chest pain/ACS  No Known Allergies  Patient Measurements:    Weight: 88.5 kg in 10/2012  Vital Signs: Temp: 98.1 F (36.7 C) (05/28 0819) Temp src: Oral (05/28 0819) BP: 118/66 mmHg (05/28 0930) Pulse Rate: 73 (05/28 0930)  Labs:  Recent Labs  01/27/13 0820  HGB 12.9  HCT 39.6  PLT 251  CREATININE 0.84  TROPONINI <0.30    The CrCl is unknown because both a height and weight (above a minimum accepted value) are required for this calculation.   Medical History: Past Medical History  Diagnosis Date  . Coronary artery disease   . Hypertension   . Anxiety   . Fibromyalgia   . Dysrhythmia     history of "irregular heart rate"'; pt. thinks it was A-fib    Medications:  Aspirin 81, lasix, neurontin, kdur, diovan, kdur, xanax, prn albuterol   Assessment:  Ms. Ryback is a 77 yo F to start heparin per pharmacy for ACS/STEMI.  Her wt in 11/18/2012 was 88.5 kg.  Her CBC is WNL.  Her symptoms initially improved with NTG, now she is c/o increasing pain.  Her admission EKG showed inferior ST depressions, T-wave abnormalities.  To be seen by cards.   Goal of Therapy:  Heparin level 0.3-0.7 units/ml Monitor platelets by anticoagulation protocol: Yes   Plan:  Give 4000 units bolus x 1 Start heparin infusion at 1100 units/hr Check anti-Xa level in 8 hours and daily while on heparin Continue to monitor H&H and platelets [name], Pharm.D. [phone] 01/27/2013 10:45 AM

## 2013-01-28 LAB — CBC
HCT: 38.9 % (ref 36.0–46.0)
Hemoglobin: 12.3 g/dL (ref 12.0–15.0)
MCV: 84.7 fL (ref 78.0–100.0)
WBC: 5.4 10*3/uL (ref 4.0–10.5)

## 2013-01-28 LAB — BASIC METABOLIC PANEL
CO2: 26 mEq/L (ref 19–32)
Chloride: 104 mEq/L (ref 96–112)
Glucose, Bld: 103 mg/dL — ABNORMAL HIGH (ref 70–99)
Sodium: 140 mEq/L (ref 135–145)

## 2013-01-28 LAB — TROPONIN I: Troponin I: 2.08 ng/mL (ref <0.30)

## 2013-01-28 LAB — PATHOLOGIST SMEAR REVIEW: Path Review: REACTIVE

## 2013-01-28 MED ORDER — NITROGLYCERIN 0.4 MG SL SUBL: 0.4000 mg | SUBLINGUAL_TABLET | SUBLINGUAL | Status: DC | PRN

## 2013-01-28 MED ORDER — TICAGRELOR 90 MG PO TABS: 90.0000 mg | ORAL_TABLET | Freq: Two times a day (BID) | ORAL | Status: DC

## 2013-01-28 MED ORDER — ATORVASTATIN CALCIUM 20 MG PO TABS: 20.0000 mg | ORAL_TABLET | Freq: Every day | ORAL | Status: DC

## 2013-01-28 MED ORDER — ATORVASTATIN CALCIUM 20 MG PO TABS
20.0000 mg | ORAL_TABLET | Freq: Every day | ORAL | Status: DC
  Filled 2013-01-28: qty 1

## 2013-01-28 MED FILL — Sodium Chloride IV Soln 0.9%: INTRAVENOUS | Qty: 50 | Status: AC

## 2013-01-28 NOTE — Progress Notes (Signed)
CRITICAL VALUE ALERT  Critical value received:  Troponin 2.08  Date of notification:  01/28/2013  Time of notification:  07:00  Critical value read back:yes  Nurse who received alert:  Augusto Gamble  MD notified (1st page):  Dr. Anne Fu  Time of first page:  07:00  MD notified (2nd page):  Time of second page:  Responding MD:  Dr. Anne Fu  Time MD responded:  07:00

## 2013-01-28 NOTE — Care Management Note (Signed)
    Page 1 of 1   01/28/2013     12:58:32 PM   CARE MANAGEMENT NOTE 01/28/2013  Patient:  [name], [name]   Account Number:  [id number]  Date Initiated:  01/28/2013  Documentation initiated by:  Oletta Cohn  Subjective/Objective Assessment:   77 y/o with CAD who had some CP     Action/Plan:   cardiac cath/ Brilinta benefits check   Anticipated DC Date:  01/28/2013   Anticipated DC Plan:        DC Planning Services  CM consult      Choice offered to / List presented to:             Status of service:   Medicare Important Message given?   (If response is "NO", the following Medicare IM given date fields will be blank) Date Medicare IM given:   Date Additional Medicare IM given:    Discharge Disposition:    Per UR Regulation:  Reviewed for med. necessity/level of care/duration of stay  If discussed at Long Length of Stay Meetings, dates discussed:    Comments:  01/28/13 @ 1100.Marland KitchenMarland KitchenOletta Cohn, RN, BSN, [employer] [phone] Spoke with pt at bedside regarding discharge planning and need for benefits check of Brilinta 90 mg BID.  Pt [facility] on [address] for prescriptions and refills. CM called to verify Brilinta in stock and it was NOT. Pharm Tech assures that they wll have enough for future refill, but not enough for initial prescription.  CM advised pt to get initial prescription filled at [facility] pharmacy after verifing medication in stock.

## 2013-01-28 NOTE — Discharge Summary (Signed)
Patient ID: [name] MRN: [id number] DOB/AGE: [birth year] 75 y.o.  Admit date: 01/27/2013 Discharge date: 01/28/2013 Patient Active Problem List   Diagnosis Date Noted  . Acute myocardial infarction of other lateral wall, initial episode of care   . Abdominal pain 11/13/2011  . Nausea and vomiting 11/13/2011  . Vertigo, benign positional 07/07/2011  . HTN (hypertension), malignant 07/07/2011  . Dizziness 07/07/2011  . CAD (coronary artery disease) 07/07/2011  . A-fib 07/07/2011  . Abdominal pain, lower 07/07/2011   Primary Discharge Diagnosis : Acute coronary syndrome due to circumflex plaque rupture Secondary Discharge Diagnosis: CAD with prior LAD stent Hypertension Hyperlipidemia  Significant Diagnostic Studies: Coronary angiography and DES placement CFX, [place]  Consults: None  Hospital Course: Acute coronary syndrome due to circumflex plaque rupture. Treated with DES per Dr. Eldridge Dace. Please see the cath note. Statin therapy added.    Discharge Exam: Blood pressure 169/71, pulse 57, temperature 98 F (36.7 C), temperature source Oral, resp. rate 18, height 5\' 4"  (1.626 m), weight 89.3 kg (196 lb 13.9 oz), SpO2 96.00%.   Cath site is unremarkable.  Labs:   Lab Results  Component Value Date   WBC 5.4 01/28/2013   HGB 12.3 01/28/2013   HCT 38.9 01/28/2013   MCV 84.7 01/28/2013   PLT 216 01/28/2013    Recent Labs Lab 01/27/13 0820 01/28/13 0520  NA 141 140  K 3.0* 3.6  CL 102 104  CO2 22 26  BUN 12 9  CREATININE 0.84 0.87  CALCIUM 9.2 9.1  PROT 6.8  --   BILITOT 0.3  --   ALKPHOS 74  --   ALT 16  --   AST 20  --   GLUCOSE 152* 103*   Lab Results  Component Value Date   CKTOTAL 127 07/07/2011   CKMB 2.7 07/07/2011   TROPONINI 2.08* 01/28/2013    Lab Results  Component Value Date   CHOL  Value: 271        ATP III CLASSIFICATION:  <200     mg/dL   Desirable  846-962  mg/dL   Borderline High  >=952    mg/dL   High       * 8/41/3244   CHOL  Value: 160        ATP III CLASSIFICATION:  <200     mg/dL   Desirable  010-272  mg/dL   Borderline High  >=536    mg/dL   High 6/44/0347   Lab Results  Component Value Date   HDL 42 04/18/2009   HDL 41 12/25/9561   Lab Results  Component Value Date   LDLCALC  Value: 206        Total Cholesterol/HDL:CHD Risk Coronary Heart Disease Risk Table                     Men   Women  1/2 Average Risk   3.4   3.3  Average Risk       5.0   4.4  2 X Average Risk   9.6   7.1  3 X Average Risk  23.4   11.0        Use the calculated Patient Ratio above and the CHD Risk Table to determine the patient's CHD Risk.        ATP III CLASSIFICATION (LDL):  <100     mg/dL   Optimal  875-643  mg/dL   Near or Above  Optimal  130-159  mg/dL   Borderline  161-096  mg/dL   High  >045     mg/dL   Very High* 12/09/8117   LDLCALC  Value: 107        Total Cholesterol/HDL:CHD Risk Coronary Heart Disease Risk Table                     Men   Women  1/2 Average Risk   3.4   3.3* 02/26/2008   Lab Results  Component Value Date   TRIG 113 04/18/2009   TRIG 60 02/26/2008   Lab Results  Component Value Date   CHOLHDL 6.5 04/18/2009   CHOLHDL 3.9 02/26/2008   No results found for this basename: LDLDIRECT      Radiology: CXR with bibasilar atelectasis.  EKG: Normal  FOLLOW UP PLANS AND APPOINTMENTS    Medication List    TAKE these medications       albuterol 108 (90 BASE) MCG/ACT inhaler  Commonly known as:  PROVENTIL HFA;VENTOLIN HFA  Inhale 2 puffs into the lungs every 6 (six) hours as needed for wheezing.     ALIGN 4 MG Caps  Take 1 capsule by mouth daily.     ALPRAZolam 0.25 MG tablet  Commonly known as:  XANAX  Take 0.25 mg by mouth 3 (three) times daily as needed. anxiety     aspirin EC 81 MG tablet  Take 81 mg by mouth daily.     fluticasone 50 MCG/ACT nasal spray  Commonly known as:  FLONASE  Place 2 sprays into the nose daily as needed for rhinitis.     furosemide 20 MG tablet    Commonly known as:  LASIX  Take 10 mg by mouth daily.     gabapentin 300 MG capsule  Commonly known as:  NEURONTIN  Take 300 mg by mouth 2 (two) times daily.     GINGER ROOT PO  Take 1 tablet by mouth daily.     loratadine 10 MG tablet  Commonly known as:  CLARITIN  Take 10 mg by mouth daily as needed. allergies     nitroGLYCERIN 0.4 MG SL tablet  Commonly known as:  NITROSTAT  Place 1 tablet (0.4 mg total) under the tongue every 5 (five) minutes as needed for chest pain (CP or SOB).     potassium chloride SA 20 MEQ tablet  Commonly known as:  K-DUR,KLOR-CON  Take 20 mEq by mouth daily.     Ticagrelor 90 MG Tabs tablet  Commonly known as:  BRILINTA  Take 1 tablet (90 mg total) by mouth 2 (two) times daily.     valsartan 160 MG tablet  Commonly known as:  DIOVAN  Take 160 mg by mouth daily.           Follow-up Information   Follow up with Lesleigh Noe, MD In 2 weeks. (Office will call with appt.)    Contact information:   [address] [place] [address] [phone]       BRING ALL MEDICATIONS WITH YOU TO FOLLOW UP APPOINTMENTS  Time spent with patient to include physician time: 30 min Signed: Lesleigh Noe 01/28/2013, 7:34 AM

## 2013-01-28 NOTE — Progress Notes (Signed)
CARDIAC REHAB PHASE I   PRE:  Rate/Rhythm: 67SR  BP:  Supine:   Sitting: 173/79  Standing:    SaO2:   MODE:  Ambulation: 800 ft   POST:  Rate/Rhythm: 88SR  BP:  Supine:   Sitting: 174/83  Standing:    SaO2:  0740-0855 Pt walked 800 ft with hand held asst . Gait steady. Tolerated well. Denied CP. Tired after walk. Education completed with pt. Understanding voiced. Discussed CRP 2 and pt gave permission for referral to [place]. Gave brilinta packet.    Luetta Nutting, RN BSN  01/28/2013 8:49 AM

## 2013-01-28 NOTE — Progress Notes (Signed)
Per rep at [facility], [name] is covered, no auth required, co-pay is $64.00 at retail. patient can use most major retial pharmacies

## 2013-02-11 ENCOUNTER — Encounter (HOSPITAL_COMMUNITY)
Admission: RE | Admit: 2013-02-11 | Discharge: 2013-02-11 | Disposition: A | Discharge status: Home / Self Care | Payer: Medicare Other | Source: Ambulatory Visit | Attending: Interventional Cardiology | Admitting: Interventional Cardiology

## 2013-02-11 DIAGNOSIS — I4891 Unspecified atrial fibrillation: Secondary | ICD-10-CM | POA: Insufficient documentation

## 2013-02-11 DIAGNOSIS — I2129 ST elevation (STEMI) myocardial infarction involving other sites: Secondary | ICD-10-CM | POA: Insufficient documentation

## 2013-02-11 DIAGNOSIS — I2510 Atherosclerotic heart disease of native coronary artery without angina pectoris: Secondary | ICD-10-CM | POA: Insufficient documentation

## 2013-02-11 DIAGNOSIS — I10 Essential (primary) hypertension: Secondary | ICD-10-CM | POA: Insufficient documentation

## 2013-02-11 DIAGNOSIS — Z5189 Encounter for other specified aftercare: Secondary | ICD-10-CM | POA: Insufficient documentation

## 2013-02-11 NOTE — Progress Notes (Signed)
Cardiac Rehab Medication Review by a Pharmacist  Does the patient  feel that his/her medications are working for him/her?  yes  Has the patient been experiencing any side effects to the medications prescribed?  no  Does the patient measure his/her own blood pressure or blood glucose at home?  yes ; blood pressure  Does the patient have any problems obtaining medications due to transportation or finances?   no  Understanding of regimen: good Understanding of indications: good Potential of compliance: good     Bret Stamour 02/11/2013 8:58 AM

## 2013-02-15 ENCOUNTER — Encounter (HOSPITAL_COMMUNITY)
Admission: RE | Admit: 2013-02-15 | Discharge: 2013-02-15 | Disposition: A | Discharge status: Home / Self Care | Payer: Medicare Other | Source: Ambulatory Visit | Attending: Interventional Cardiology | Admitting: Interventional Cardiology

## 2013-02-15 NOTE — Progress Notes (Signed)
Pt started cardiac rehab today.  Pt tolerated light exercise without difficulty. Telemetry  Rhythm Sinus Rhythm without ectopy. Vital signs stable.  [name] reported feeling depressed and having some shortness of breath since her discharge from the hospital. Dr Katrinka Blazing is aware of this per his office note on 02/01/2013. [name] did not complain of any shortness of breath today with exercise. Will continue to monitor the patient throughout  the program.

## 2013-02-17 ENCOUNTER — Encounter (HOSPITAL_COMMUNITY)
Admission: RE | Admit: 2013-02-17 | Discharge: 2013-02-17 | Disposition: A | Discharge status: Home / Self Care | Payer: Medicare Other | Source: Ambulatory Visit | Attending: Interventional Cardiology | Admitting: Interventional Cardiology

## 2013-02-19 ENCOUNTER — Encounter (HOSPITAL_COMMUNITY)
Admission: RE | Admit: 2013-02-19 | Discharge: 2013-02-19 | Disposition: A | Discharge status: Home / Self Care | Payer: Medicare Other | Source: Ambulatory Visit | Attending: Interventional Cardiology | Admitting: Interventional Cardiology

## 2013-02-22 ENCOUNTER — Encounter (HOSPITAL_COMMUNITY)
Admission: RE | Admit: 2013-02-22 | Discharge: 2013-02-22 | Disposition: A | Discharge status: Home / Self Care | Payer: Medicare Other | Source: Ambulatory Visit | Attending: Interventional Cardiology | Admitting: Interventional Cardiology

## 2013-02-22 NOTE — Progress Notes (Signed)
[name] was noted to have frequent PVC's this AM during exercise at cardiac rehab.   [name] complained of feeling tired otherwise patient asymptomatic.  Vital signs stable.  Dr Anne Fu reviewed today's ECG tracings and said [name] was okay to go home. Will continue to monitor the patient throughout  the program.

## 2013-02-24 ENCOUNTER — Encounter (HOSPITAL_COMMUNITY)
Admission: RE | Admit: 2013-02-24 | Discharge: 2013-02-24 | Disposition: A | Discharge status: Home / Self Care | Payer: Medicare Other | Source: Ambulatory Visit | Attending: Interventional Cardiology | Admitting: Interventional Cardiology

## 2013-02-26 ENCOUNTER — Encounter (HOSPITAL_COMMUNITY)
Admission: RE | Admit: 2013-02-26 | Discharge: 2013-02-26 | Disposition: A | Discharge status: Home / Self Care | Payer: Medicare Other | Source: Ambulatory Visit | Attending: Interventional Cardiology | Admitting: Interventional Cardiology

## 2013-03-01 ENCOUNTER — Encounter (HOSPITAL_COMMUNITY)
Admission: RE | Admit: 2013-03-01 | Discharge: 2013-03-01 | Disposition: A | Discharge status: Home / Self Care | Payer: Medicare Other | Source: Ambulatory Visit | Attending: Interventional Cardiology | Admitting: Interventional Cardiology

## 2013-03-01 NOTE — Progress Notes (Signed)
Reviewed home exercise with pt today.  Pt plans to walk and use stationary bike at home for exercise.  Reviewed THR, pulse, RPE, sign and symptoms, NTG use, and when to call 911 or MD.  Pt voiced understanding. Suheyla Mortellaro, MA, ACSM RCEP  

## 2013-03-03 ENCOUNTER — Encounter (HOSPITAL_COMMUNITY)
Admission: RE | Admit: 2013-03-03 | Discharge: 2013-03-03 | Disposition: A | Discharge status: Home / Self Care | Payer: Medicare Other | Source: Ambulatory Visit | Attending: Interventional Cardiology | Admitting: Interventional Cardiology

## 2013-03-03 DIAGNOSIS — Z5189 Encounter for other specified aftercare: Secondary | ICD-10-CM | POA: Insufficient documentation

## 2013-03-03 DIAGNOSIS — I2510 Atherosclerotic heart disease of native coronary artery without angina pectoris: Secondary | ICD-10-CM | POA: Insufficient documentation

## 2013-03-03 DIAGNOSIS — I2129 ST elevation (STEMI) myocardial infarction involving other sites: Secondary | ICD-10-CM | POA: Insufficient documentation

## 2013-03-03 DIAGNOSIS — I4891 Unspecified atrial fibrillation: Secondary | ICD-10-CM | POA: Insufficient documentation

## 2013-03-03 DIAGNOSIS — I10 Essential (primary) hypertension: Secondary | ICD-10-CM | POA: Insufficient documentation

## 2013-03-05 ENCOUNTER — Encounter (HOSPITAL_COMMUNITY): Payer: Medicare Other

## 2013-03-08 ENCOUNTER — Encounter (HOSPITAL_COMMUNITY)
Admission: RE | Admit: 2013-03-08 | Discharge: 2013-03-08 | Disposition: A | Discharge status: Home / Self Care | Payer: Medicare Other | Source: Ambulatory Visit | Attending: Interventional Cardiology | Admitting: Interventional Cardiology

## 2013-03-10 ENCOUNTER — Encounter (HOSPITAL_COMMUNITY)
Admission: RE | Admit: 2013-03-10 | Discharge: 2013-03-10 | Disposition: A | Discharge status: Home / Self Care | Payer: Medicare Other | Source: Ambulatory Visit | Attending: Interventional Cardiology | Admitting: Interventional Cardiology

## 2013-03-12 ENCOUNTER — Encounter (HOSPITAL_COMMUNITY)
Admission: RE | Admit: 2013-03-12 | Discharge: 2013-03-12 | Disposition: A | Discharge status: Home / Self Care | Payer: Medicare Other | Source: Ambulatory Visit | Attending: Interventional Cardiology | Admitting: Interventional Cardiology

## 2013-03-15 ENCOUNTER — Encounter (HOSPITAL_COMMUNITY)
Admission: RE | Admit: 2013-03-15 | Discharge: 2013-03-15 | Disposition: A | Discharge status: Home / Self Care | Payer: Medicare Other | Source: Ambulatory Visit | Attending: Interventional Cardiology | Admitting: Interventional Cardiology

## 2013-03-15 NOTE — Progress Notes (Signed)
[name] reports  Increased swelling in her ankles. [name]'s weight is up 0.8 kg her last exercise session this past Friday. Lung fields clear upon auscultation.  Oxygen saturation 100% on Room Air. [name] denies shortness of breath and reported eating some fried scallops over the weekend. Will fax exercise flow sheets to Dr. Michaelle Copas  office for review.                       +     ................................................................................................................................................................................................................................................................................................................................................................................................0

## 2013-03-15 NOTE — Progress Notes (Signed)
[name] 77 y.o. female Nutrition Note Spoke with pt.  Nutrition Plan and Nutrition Survey goals reviewed with pt. Pt is following Step 2 of the Therapeutic Lifestyle Changes diet. Pt wants to lose wt. Pt has been trying to lose wt by "exercising in cardiac rehab and walking up and down my driveway." Wt loss tips reviewed. Pt's A1c in 2010 discussed. Pt states she is aware that she is pre-diabetic and "Dr. Renae Gloss monitors that." Pre-diabetes briefly discussed. Pt is unable to tolerate statin medications. Pt states, "I'm down to taking Lipitor once a week." Increasing soluble fiber and adding plant stanol/sterol esters to diet discussed. Pt expressed understanding of the information reviewed. Pt aware of nutrition education classes offered and wants to attend nutrition classes and is unsure if she will be able to attend classes at this time.  Nutrition Diagnosis   Food-and nutrition-related knowledge deficit related to lack of exposure to information as related to diagnosis of: ? CVD ?    Obesity related to excessive energy intake as evidenced by a BMI of 31.2    Nutrition RX/ Estimated Daily Nutrition Needs for: wt loss  1200-1650 Kcal, 30-45 gm fat, 8-13 gm sat fat, 1.1-1.6 gm trans-fat, <1500 mg sodium   Nutrition Intervention   Pt's individual nutrition plan including cholesterol goals reviewed with pt.   Benefits of adopting Therapeutic Lifestyle Changes discussed when Medficts reviewed.   Pt to attend the Portion Distortion class   Pt to attend the  ? Nutrition I class                     ? Nutrition II class   Pt given handouts for: ? Nutrition I class ? Nutrition II class    Continue client-centered nutrition education by RD, as part of interdisciplinary care.  Goal(s)   Pt to identify food quantities necessary to achieve: ? wt loss to a goal wt of 172-190 lb (78.2-86.4 kg) at graduation from cardiac rehab.    Pt to describe the benefit of including fruits, vegetables,  whole grains, and low-fat dairy products in a heart healthy meal plan.  Monitor and Evaluate progress toward nutrition goal with team. Nutrition Risk:  Low   Mickle Plumb, M.Ed, RD, LDN, CDE 03/15/2013 10:49 AM

## 2013-03-17 ENCOUNTER — Encounter (HOSPITAL_COMMUNITY)
Admission: RE | Admit: 2013-03-17 | Discharge: 2013-03-17 | Disposition: A | Discharge status: Home / Self Care | Payer: Medicare Other | Source: Ambulatory Visit | Attending: Interventional Cardiology | Admitting: Interventional Cardiology

## 2013-03-19 ENCOUNTER — Encounter (HOSPITAL_COMMUNITY)
Admission: RE | Admit: 2013-03-19 | Discharge: 2013-03-19 | Disposition: A | Discharge status: Home / Self Care | Payer: Medicare Other | Source: Ambulatory Visit | Attending: Interventional Cardiology | Admitting: Interventional Cardiology

## 2013-03-22 ENCOUNTER — Encounter (HOSPITAL_COMMUNITY)
Admission: RE | Admit: 2013-03-22 | Discharge: 2013-03-22 | Disposition: A | Discharge status: Home / Self Care | Payer: Medicare Other | Source: Ambulatory Visit | Attending: Interventional Cardiology | Admitting: Interventional Cardiology

## 2013-03-24 ENCOUNTER — Encounter (HOSPITAL_COMMUNITY)
Admission: RE | Admit: 2013-03-24 | Discharge: 2013-03-24 | Disposition: A | Discharge status: Home / Self Care | Payer: Medicare Other | Source: Ambulatory Visit | Attending: Interventional Cardiology | Admitting: Interventional Cardiology

## 2013-03-26 ENCOUNTER — Ambulatory Visit (HOSPITAL_COMMUNITY)
Admission: RE | Admit: 2013-03-26 | Discharge: 2013-03-26 | Disposition: A | Discharge status: Home / Self Care | Payer: Medicare Other | Source: Ambulatory Visit | Attending: Internal Medicine | Admitting: Internal Medicine

## 2013-03-26 ENCOUNTER — Encounter (HOSPITAL_COMMUNITY)
Admission: RE | Admit: 2013-03-26 | Discharge: 2013-03-26 | Disposition: A | Discharge status: Home / Self Care | Payer: Medicare Other | Source: Ambulatory Visit | Attending: Interventional Cardiology | Admitting: Interventional Cardiology

## 2013-03-26 ENCOUNTER — Encounter (HOSPITAL_COMMUNITY): Payer: Self-pay | Admitting: Emergency Medicine

## 2013-03-26 ENCOUNTER — Emergency Department (HOSPITAL_COMMUNITY)
Admission: EM | Admit: 2013-03-26 | Discharge: 2013-03-26 | Disposition: A | Discharge status: Home / Self Care | Payer: Medicare Other | Attending: Emergency Medicine | Admitting: Emergency Medicine

## 2013-03-26 ENCOUNTER — Emergency Department (HOSPITAL_COMMUNITY): Payer: Medicare Other

## 2013-03-26 ENCOUNTER — Other Ambulatory Visit: Payer: Self-pay

## 2013-03-26 DIAGNOSIS — Z7982 Long term (current) use of aspirin: Secondary | ICD-10-CM | POA: Insufficient documentation

## 2013-03-26 DIAGNOSIS — Z8679 Personal history of other diseases of the circulatory system: Secondary | ICD-10-CM | POA: Insufficient documentation

## 2013-03-26 DIAGNOSIS — Z87891 Personal history of nicotine dependence: Secondary | ICD-10-CM | POA: Insufficient documentation

## 2013-03-26 DIAGNOSIS — Z79899 Other long term (current) drug therapy: Secondary | ICD-10-CM | POA: Insufficient documentation

## 2013-03-26 DIAGNOSIS — I252 Old myocardial infarction: Secondary | ICD-10-CM | POA: Insufficient documentation

## 2013-03-26 DIAGNOSIS — I2510 Atherosclerotic heart disease of native coronary artery without angina pectoris: Secondary | ICD-10-CM | POA: Insufficient documentation

## 2013-03-26 DIAGNOSIS — Z8719 Personal history of other diseases of the digestive system: Secondary | ICD-10-CM | POA: Insufficient documentation

## 2013-03-26 DIAGNOSIS — IMO0001 Reserved for inherently not codable concepts without codable children: Secondary | ICD-10-CM | POA: Insufficient documentation

## 2013-03-26 DIAGNOSIS — R1032 Left lower quadrant pain: Secondary | ICD-10-CM | POA: Insufficient documentation

## 2013-03-26 DIAGNOSIS — I10 Essential (primary) hypertension: Secondary | ICD-10-CM | POA: Insufficient documentation

## 2013-03-26 DIAGNOSIS — R079 Chest pain, unspecified: Secondary | ICD-10-CM

## 2013-03-26 DIAGNOSIS — Z9861 Coronary angioplasty status: Secondary | ICD-10-CM | POA: Insufficient documentation

## 2013-03-26 DIAGNOSIS — Z8739 Personal history of other diseases of the musculoskeletal system and connective tissue: Secondary | ICD-10-CM | POA: Insufficient documentation

## 2013-03-26 DIAGNOSIS — E7800 Pure hypercholesterolemia, unspecified: Secondary | ICD-10-CM | POA: Insufficient documentation

## 2013-03-26 DIAGNOSIS — M129 Arthropathy, unspecified: Secondary | ICD-10-CM | POA: Insufficient documentation

## 2013-03-26 DIAGNOSIS — F411 Generalized anxiety disorder: Secondary | ICD-10-CM | POA: Insufficient documentation

## 2013-03-26 DIAGNOSIS — R197 Diarrhea, unspecified: Secondary | ICD-10-CM | POA: Insufficient documentation

## 2013-03-26 LAB — HEPATIC FUNCTION PANEL
Alkaline Phosphatase: 86 U/L (ref 39–117)
Total Protein: 7 g/dL (ref 6.0–8.3)

## 2013-03-26 LAB — POCT I-STAT TROPONIN I: Troponin i, poc: 0 ng/mL (ref 0.00–0.08)

## 2013-03-26 LAB — URINALYSIS, ROUTINE W REFLEX MICROSCOPIC
Leukocytes, UA: NEGATIVE
Nitrite: NEGATIVE
Protein, ur: NEGATIVE mg/dL
Urobilinogen, UA: 0.2 mg/dL (ref 0.0–1.0)

## 2013-03-26 LAB — CBC
Platelets: 260 10*3/uL (ref 150–400)
RBC: 4.81 MIL/uL (ref 3.87–5.11)
RDW: 14.3 % (ref 11.5–15.5)
WBC: 8.9 10*3/uL (ref 4.0–10.5)

## 2013-03-26 LAB — LIPASE, BLOOD: Lipase: 31 U/L (ref 11–59)

## 2013-03-26 LAB — BASIC METABOLIC PANEL
CO2: 26 mEq/L (ref 19–32)
Chloride: 100 mEq/L (ref 96–112)
GFR calc Af Amer: 68 mL/min — ABNORMAL LOW (ref >90)
Potassium: 4 mEq/L (ref 3.5–5.1)

## 2013-03-26 MED ORDER — ASPIRIN 325 MG PO TABS
325.0000 mg | ORAL_TABLET | ORAL | Status: AC
  Administered 2013-03-26: 325 mg via ORAL
  Filled 2013-03-26: qty 1

## 2013-03-26 MED ORDER — IOHEXOL 300 MG/ML  SOLN
100.0000 mL | Freq: Once | INTRAMUSCULAR | Status: AC | PRN
  Administered 2013-03-26: 100 mL via INTRAVENOUS

## 2013-03-26 MED ORDER — IOHEXOL 300 MG/ML  SOLN
25.0000 mL | INTRAMUSCULAR | Status: AC
  Administered 2013-03-26: 25 mL via ORAL

## 2013-03-26 MED ORDER — ISOSORBIDE MONONITRATE ER 30 MG PO TB24: 30.0000 mg | ORAL_TABLET | Freq: Every day | ORAL | Status: DC

## 2013-03-26 MED ORDER — NITROGLYCERIN 2 % TD OINT
1.0000 [in_us] | TOPICAL_OINTMENT | Freq: Once | TRANSDERMAL | Status: AC
  Administered 2013-03-26: 1 [in_us] via TOPICAL
  Filled 2013-03-26: qty 1

## 2013-03-26 NOTE — Progress Notes (Signed)
[name] reports having neck jaw pain and indigestion last night. [name] rated the pain a 6/10. [name] said she did not take any SL nitroglycerin but took and advil instead  And went to bed. [name] reports feeling a tightness in her neck and jaw that continued this morning. [name] says she felt this sensation this morning at cardiac rehab she rated the sensation a 4/10. [name] said dr Titus Dubin started her on Cymbalta two days ago and she has bee feeling lightheaded ever since. Initial blood pressure 170/100 repeat blood pressure 158/70 after rest. 12 lead ECG obtained. Dr Michaelle Copas office called and notified spoke with [name], Dr Michaelle Copas nurse. [name] reported a relief of symptoms after ECG obtained. Will fax today's ECg tracings to DR Smith's office for review.

## 2013-03-26 NOTE — ED Provider Notes (Signed)
CSN: [id number]     Arrival date & time 03/26/13  1116 History     First MD Initiated Contact with Patient 03/26/13 1213     Chief Complaint  Patient presents with  . Chest Pain   (Consider location/radiation/quality/duration/timing/severity/associated sxs/prior Treatment) HPI Comments: This patient is a 77 year old female with past medical history of hypertension, hyperlipidemia, GERD, and coronary artery disease with acute MI which was stented on 5/28.  She states she's been doing very well postoperatively; however, yesterday she began having anterior neck and facial/sinus pressure which she describes as moderate and fairly constant. Nothing makes his pressure better or worse. She states that she had these symptoms previously when she was diagnosed with myocardial infarction.  Today at cardiac rehabilitation she began to notice chest discomfort which she describes as feeling like indigestion.  Her blood pressure was also noted to be elevated at that time; therefore, she was sent to the emergency department for further evaluation.  Patient denies fevers. She does note that she's had some congestion and sinus drainage for approximately one week for which she took Mucinex that improved the symptoms. She denies dyspnea she does deny as nausea or vomiting.  Notably patient reports having nonbloody diarrhea that started on approximately July 23 and is described as being severe. She also endorses left lower quadrant pain along with this diarrhea. She is unsure of when the pain started or if it's been constant or intermittent. She is unsure whether anything makes it better or worse.  She states she thinks this all started when she began taking cymbalta a few days ago.   Past Medical History  Diagnosis Date  . Coronary artery disease   . Hypertension   . Anxiety   . Fibromyalgia   . Dysrhythmia     history of "irregular heart rate"'; pt. thinks it was A-fib  . Vertigo 08/27/2006    [name]  08/27/2006 (01/27/2013)  . Ventricular hypertrophy     [name] 08/27/2006 (01/27/2013)  . High cholesterol   . Anginal pain   . Exertional shortness of breath     "sometimes" (01/27/2013)  . History of stomach ulcers     "years ago" (528/2014)  . GERD (gastroesophageal reflux disease)   . Arthritis     "q where" (01/27/2013)  . Osteoporosis   . Acute myocardial infarction, unspecified site, initial episode of care   . Acute myocardial infarction of other lateral wall, initial episode of care    Past Surgical History  Procedure Laterality Date  . Hernia repair    . Laparotomy      "day after oophorectomy; had bowel obstruction" (01/27/2013)  . Coronary angioplasty with stent placement  06/2005; 01/27/2013    stent to LAD/notes 08/27/2006; "+ 1 stent today" (01/27/2013)  . Cardiac catheterization  10/2006    [name] 08/03/2007 (01/27/2013)  . Tonsillectomy and adenoidectomy  ~ 1952  . Abdominal hysterectomy  1970's  . Laparoscopic salpingo oopherectomy  2000  . Diagnostic laparoscopy  2000    lap abdominal lysis of adhesions  . Breast lumpectomy Right 1970's    benign   . Breast biopsy Right 1970's   History reviewed. No pertinent family history. History  Substance Use Topics  . Smoking status: Former Smoker -- 0.12 packs/day for 36 years    Types: Cigarettes    Quit date: 12/02/1991  . Smokeless tobacco: Never Used  . Alcohol Use: No   OB History   Grav Para Term Preterm Abortions TAB SAB Ect Mult  Living                 Review of Systems  Constitutional: Negative.   HENT: Negative.   Eyes: Negative.   Respiratory:       See history of present illness  Cardiovascular: Negative for palpitations and leg swelling.       The history of present illness  Gastrointestinal: Negative for nausea and vomiting.       See history of present illness  Endocrine: Negative.   Genitourinary: Negative.   Musculoskeletal: Negative.   Skin: Negative.   Allergic/Immunologic: Negative.    Neurological: Negative.   Hematological: Negative.   Psychiatric/Behavioral: Negative.   All other systems reviewed and are negative.    Allergies  Review of patient's allergies indicates no known allergies.  Home Medications   Current Outpatient Rx  Name  Route  Sig  Dispense  Refill  . ALPRAZolam (XANAX) 0.25 MG tablet   Oral   Take 0.25 mg by mouth 3 (three) times daily as needed. anxiety         . aspirin EC 81 MG tablet   Oral   Take 81 mg by mouth daily.           [name] atorvastatin (LIPITOR) 20 MG tablet   Oral   Take 1 tablet (20 mg total) by mouth daily at 6 PM.   30 tablet   11   . clopidogrel (PLAVIX) 75 MG tablet   Oral   Take 75 mg by mouth daily.         . DULoxetine (CYMBALTA) 20 MG capsule   Oral   Take 20 mg by mouth daily.         . fluticasone (FLONASE) 50 MCG/ACT nasal spray   Nasal   Place 2 sprays into the nose daily as needed for rhinitis.         . furosemide (LASIX) 20 MG tablet   Oral   Take 10 mg by mouth daily.          [name] gabapentin (NEURONTIN) 300 MG capsule   Oral   Take 300 mg by mouth 2 (two) times daily.           . Ginger, Zingiber officinalis, (GINGER ROOT PO)   Oral   Take 1 tablet by mouth daily.         . Ibuprofen-Diphenhydramine HCl (ADVIL PM) 200-25 MG CAPS   Oral   Take 1 tablet by mouth at bedtime as needed (for sleep).         . loratadine (CLARITIN) 10 MG tablet   Oral   Take 10 mg by mouth daily as needed. allergies          . potassium chloride SA (K-DUR,KLOR-CON) 20 MEQ tablet   Oral   Take 20 mEq by mouth daily.         . Probiotic Product (ALIGN) 4 MG CAPS   Oral   Take 1 capsule by mouth daily.         . valsartan (DIOVAN) 160 MG tablet   Oral   Take 160 mg by mouth daily.           [name] albuterol (PROVENTIL HFA;VENTOLIN HFA) 108 (90 BASE) MCG/ACT inhaler   Inhalation   Inhale 2 puffs into the lungs every 6 (six) hours as needed for wheezing.         . nitroGLYCERIN  (NITROSTAT) 0.4 MG SL tablet   Sublingual   Place  1 tablet (0.4 mg total) under the tongue every 5 (five) minutes as needed for chest pain (CP or SOB).   25 tablet   12   . zolpidem (AMBIEN) 10 MG tablet   Oral   Take 5 mg by mouth at bedtime as needed for sleep.          BP 176/85  Pulse 62  Temp(Src) 97.9 F (36.6 C) (Oral)  Resp 20  SpO2 100% Physical Exam  Nursing note and vitals reviewed. CONSTITUTIONAL  well developed, well nourished, alert, non toxic appearing HEENT  normocephalic, atraumatic, external ears normal, nose normal, mucus membranes moist, oropharynx clear EYES  normal conjunctiva, no sclera icterus noted, EOM intact, PERRL NECK  normal ROM, supple, no JVD CARDIOVASCULAR  no signifcant murmur noted, full and effective pulses; RRR; 1+ pitting edema in the right lower extremity 2+ pitting edema and left lower extremity (patient states this is chronic); warm and well-perfused throughout with good pulses. PULMONARY/CHEST WALL  normal effort, no respiratory distress, BS normal, no wheezes, no rhonchi, no rales ABDOMINAL  normal appearance, soft, nondistended, moderate tenderness to palpation on the left side with no rebound or guarding. GU  deferred MUSCULOSKELETAL  Normal range of motion of extremities NEUROLOGICAL  patient is awake and responsive;  no significant motor or sensory deficit noted  SKIN  warm, dry, intact, no rash PSYCHIATRIC normal affect   ED Course   Procedures (including critical care time)  Labs Reviewed  CBC  BASIC METABOLIC PANEL  POCT I-STAT TROPONIN I   EKG:  normal sinus rhythm HR 60, PR interval 133, QRS interval 81, QTC 397. When compared to EKG from 01/28/2013 no significant interval changes. Results for orders placed during the hospital encounter of 03/26/13  CBC      Result Value Range   WBC 8.9  4.0 - 10.5 K/uL   RBC 4.81  3.87 - 5.11 MIL/uL   Hemoglobin 13.9  12.0 - 15.0 g/dL   HCT 14.7  82.9 - 56.2 %   MCV 83.2  78.0 -  100.0 fL   MCH 28.9  26.0 - 34.0 pg   MCHC 34.8  30.0 - 36.0 g/dL   RDW 13.0  86.5 - 78.4 %   Platelets 260  150 - 400 K/uL  BASIC METABOLIC PANEL      Result Value Range   Sodium 137  135 - 145 mEq/L   Potassium 4.0  3.5 - 5.1 mEq/L   Chloride 100  96 - 112 mEq/L   CO2 26  19 - 32 mEq/L   Glucose, Bld 100 (*) 70 - 99 mg/dL   BUN 24 (*) 6 - 23 mg/dL   Creatinine, Ser 6.96  0.50 - 1.10 mg/dL   Calcium 9.4  8.4 - 29.5 mg/dL   GFR calc non Af Amer 58 (*) >90 mL/min   GFR calc Af Amer 68 (*) >90 mL/min  URINALYSIS, ROUTINE W REFLEX MICROSCOPIC      Result Value Range   Color, Urine YELLOW  YELLOW   APPearance CLEAR  CLEAR   Specific Gravity, Urine 1.011  1.005 - 1.030   pH 6.0  5.0 - 8.0   Glucose, UA NEGATIVE  NEGATIVE mg/dL   Hgb urine dipstick NEGATIVE  NEGATIVE   Bilirubin Urine NEGATIVE  NEGATIVE   Ketones, ur NEGATIVE  NEGATIVE mg/dL   Protein, ur NEGATIVE  NEGATIVE mg/dL   Urobilinogen, UA 0.2  0.0 - 1.0 mg/dL   Nitrite NEGATIVE  NEGATIVE  Leukocytes, UA NEGATIVE  NEGATIVE  HEPATIC FUNCTION PANEL      Result Value Range   Total Protein 7.0  6.0 - 8.3 g/dL   Albumin 3.7  3.5 - 5.2 g/dL   AST 14  0 - 37 U/L   ALT 15  0 - 35 U/L   Alkaline Phosphatase 86  39 - 117 U/L   Total Bilirubin 0.4  0.3 - 1.2 mg/dL   Bilirubin, Direct <1.6  0.0 - 0.3 mg/dL   Indirect Bilirubin NOT CALCULATED  0.3 - 0.9 mg/dL  LIPASE, BLOOD      Result Value Range   Lipase 31  11 - 59 U/L  POCT I-STAT TROPONIN I      Result Value Range   Troponin i, poc 0.00  0.00 - 0.08 ng/mL   Comment 3            Dg Chest 2 View  03/26/2013   *RADIOLOGY REPORT*  Clinical Data: Chest pain.  CHEST - 2 VIEW  Comparison: 01/27/2013.  Findings:  Cardiopericardial silhouette within normal limits. Mediastinal contours normal. Trachea midline.  No airspace disease or effusion.  Chronic elevation right hemidiaphragm.   Mild thoracic spondylosis. Monitoring leads are projected over the chest.  IMPRESSION: No acute  cardiopulmonary disease.   Original Report Authenticated By: Andreas Newport, M.D.   Ct Abdomen Pelvis W Contrast  03/26/2013   *RADIOLOGY REPORT*  Clinical Data: Left lower quadrant pain.  Abdominal pain.  CT ABDOMEN AND PELVIS WITH CONTRAST  Technique:  Multidetector CT imaging of the abdomen and pelvis was performed following the standard protocol during bolus administration of intravenous contrast.  Contrast: OMNIPAQUE IOHEXOL 300 MG/ML  SOLN  Comparison: 11/13/2011.  Findings: Lung Bases: Unchanged linear scarring is present in the lingula.  Dependent atelectasis.  Liver:  Probable hepatosteatosis.  No ductal dilation.  Spleen:  Normal.  Gallbladder:  Normal.  Common bile duct:  Normal.  Pancreas:  Normal.  Adrenal glands:  Normal.  Kidneys:  Unchanged left interpolar renal cyst.  Bilateral extrarenal pelvis.  Both ureters appear within normal limits.  Stomach:  Small hiatal hernia.  Small bowel:  Normal duodenum.  No small bowel obstruction or inflammatory changes.  No mesenteric adenopathy.  Colon:   Diverticulosis without diverticulitis.  Normal appendix.  Pelvic Genitourinary:  Hysterectomy.  Normal urinary bladder.  No free fluid.  Bones:  No aggressive osseous lesions.  Vasculature: Atherosclerosis.  No acute vascular abnormality. Shaggy mural plaque.  Body Wall: Injection granulomas in both gluteal regions.  Fat containing right inguinal hernia.  IMPRESSION:  1.  No acute abnormality. 2.  Atherosclerosis. 3.  left interpolar renal cyst.  4.  Hysterectomy. 5.  Hepatosteatosis.   Original Report Authenticated By: Andreas Newport, M.D.      MDM  1. Chest pain/Facial pain: EKG unchanged from previous and reassuring. Troponin negative. Chest pain very mild upon arrival to the emergency department and completely resolved after nitroglycerin. I spoke with [facility] who follows the patient as an outpatient. They feel the patient is currently pain-free, it would be reasonable to start her on  Imdur as an outpatient with close followup. They base this decision on her cardiovascular disease, catheterization report, and current presentation. I do not suspect pericarditis, esophageal or aortic etiology, pneumonia or pneumothorax  2. Diarrhea with LLQ pain: Labs are reassuring. CT scan obtained to rule out diverticulitis, and this was negative. Patient appears comfortable on multiple reassessments. The symptoms started after initiation of her  SSRI. Would recommend discontinuation of this medication at this time. She has only taken it for a few days now, so low risk of stopping now.  No antibiotics indicated at this time for the diarrhea.  Ashby Dawes, MD 03/26/13 [phone]

## 2013-03-26 NOTE — ED Notes (Signed)
Pt from cardiac rehab, c/o jaw, neck, and shoulder pain. Pt states it began last night, came to rehab this am. Pain 7/10. Pt received 81mg  asa pta.

## 2013-03-26 NOTE — Progress Notes (Signed)
Dr Mayford Knife reviewed 12 lead EKG and advised the [name] be taken to the ED for further evaluation.  Catherine Sparger was transported to the ED via stretcher on 2l/min of oxygen via [place]. Catherine Vetter's husband [name] present and aware .  Report given to ED RN.

## 2013-03-26 NOTE — ED Notes (Signed)
Patient transported to X-ray 

## 2013-03-29 ENCOUNTER — Encounter (HOSPITAL_COMMUNITY): Admission: RE | Admit: 2013-03-29 | Payer: Medicare Other | Source: Ambulatory Visit

## 2013-03-31 ENCOUNTER — Encounter (HOSPITAL_COMMUNITY)
Admission: RE | Admit: 2013-03-31 | Discharge: 2013-03-31 | Disposition: A | Discharge status: Home / Self Care | Payer: Medicare Other | Source: Ambulatory Visit | Attending: Interventional Cardiology | Admitting: Interventional Cardiology

## 2013-04-02 ENCOUNTER — Encounter (HOSPITAL_COMMUNITY)
Admission: RE | Admit: 2013-04-02 | Discharge: 2013-04-02 | Disposition: A | Discharge status: Home / Self Care | Payer: Medicare Other | Source: Ambulatory Visit | Attending: Interventional Cardiology | Admitting: Interventional Cardiology

## 2013-04-02 DIAGNOSIS — I2129 ST elevation (STEMI) myocardial infarction involving other sites: Secondary | ICD-10-CM | POA: Insufficient documentation

## 2013-04-02 DIAGNOSIS — Z5189 Encounter for other specified aftercare: Secondary | ICD-10-CM | POA: Insufficient documentation

## 2013-04-02 DIAGNOSIS — I2510 Atherosclerotic heart disease of native coronary artery without angina pectoris: Secondary | ICD-10-CM | POA: Insufficient documentation

## 2013-04-02 DIAGNOSIS — I10 Essential (primary) hypertension: Secondary | ICD-10-CM | POA: Insufficient documentation

## 2013-04-02 DIAGNOSIS — I4891 Unspecified atrial fibrillation: Secondary | ICD-10-CM | POA: Insufficient documentation

## 2013-04-05 ENCOUNTER — Encounter (HOSPITAL_COMMUNITY)
Admission: RE | Admit: 2013-04-05 | Discharge: 2013-04-05 | Disposition: A | Discharge status: Home / Self Care | Payer: Medicare Other | Source: Ambulatory Visit | Attending: Interventional Cardiology | Admitting: Interventional Cardiology

## 2013-04-07 ENCOUNTER — Encounter (HOSPITAL_COMMUNITY): Payer: Medicare Other

## 2013-04-09 ENCOUNTER — Encounter (HOSPITAL_COMMUNITY): Payer: Medicare Other

## 2013-04-12 ENCOUNTER — Encounter (HOSPITAL_COMMUNITY)
Admission: RE | Admit: 2013-04-12 | Discharge: 2013-04-12 | Disposition: A | Discharge status: Home / Self Care | Payer: Medicare Other | Source: Ambulatory Visit | Attending: Interventional Cardiology | Admitting: Interventional Cardiology

## 2013-04-14 ENCOUNTER — Encounter (HOSPITAL_COMMUNITY)
Admission: RE | Admit: 2013-04-14 | Discharge: 2013-04-14 | Disposition: A | Discharge status: Home / Self Care | Payer: Medicare Other | Source: Ambulatory Visit | Attending: Interventional Cardiology | Admitting: Interventional Cardiology

## 2013-04-16 ENCOUNTER — Encounter (HOSPITAL_COMMUNITY)
Admission: RE | Admit: 2013-04-16 | Discharge: 2013-04-16 | Disposition: A | Discharge status: Home / Self Care | Payer: Medicare Other | Source: Ambulatory Visit | Attending: Interventional Cardiology | Admitting: Interventional Cardiology

## 2013-04-19 ENCOUNTER — Encounter (HOSPITAL_COMMUNITY)
Admission: RE | Admit: 2013-04-19 | Discharge: 2013-04-19 | Disposition: A | Discharge status: Home / Self Care | Payer: Medicare Other | Source: Ambulatory Visit | Attending: Interventional Cardiology | Admitting: Interventional Cardiology

## 2013-04-21 ENCOUNTER — Encounter (HOSPITAL_COMMUNITY)
Admission: RE | Admit: 2013-04-21 | Discharge: 2013-04-21 | Disposition: A | Discharge status: Home / Self Care | Payer: Medicare Other | Source: Ambulatory Visit | Attending: Interventional Cardiology | Admitting: Interventional Cardiology

## 2013-04-23 ENCOUNTER — Encounter (HOSPITAL_COMMUNITY)
Admission: RE | Admit: 2013-04-23 | Discharge: 2013-04-23 | Disposition: A | Discharge status: Home / Self Care | Payer: Medicare Other | Source: Ambulatory Visit | Attending: Interventional Cardiology | Admitting: Interventional Cardiology

## 2013-04-26 ENCOUNTER — Encounter (HOSPITAL_COMMUNITY)
Admission: RE | Admit: 2013-04-26 | Discharge: 2013-04-26 | Disposition: A | Discharge status: Home / Self Care | Payer: Medicare Other | Source: Ambulatory Visit | Attending: Interventional Cardiology | Admitting: Interventional Cardiology

## 2013-04-28 ENCOUNTER — Encounter (HOSPITAL_COMMUNITY)
Admission: RE | Admit: 2013-04-28 | Discharge: 2013-04-28 | Disposition: A | Discharge status: Home / Self Care | Payer: Medicare Other | Source: Ambulatory Visit | Attending: Interventional Cardiology | Admitting: Interventional Cardiology

## 2013-04-30 ENCOUNTER — Encounter (HOSPITAL_COMMUNITY)
Admission: RE | Admit: 2013-04-30 | Discharge: 2013-04-30 | Disposition: A | Discharge status: Home / Self Care | Payer: Medicare Other | Source: Ambulatory Visit | Attending: Interventional Cardiology | Admitting: Interventional Cardiology

## 2013-05-03 ENCOUNTER — Encounter (HOSPITAL_COMMUNITY): Admission: RE | Admit: 2013-05-03 | Payer: Medicare Other | Source: Ambulatory Visit

## 2013-05-03 DIAGNOSIS — I2510 Atherosclerotic heart disease of native coronary artery without angina pectoris: Secondary | ICD-10-CM | POA: Insufficient documentation

## 2013-05-03 DIAGNOSIS — I4891 Unspecified atrial fibrillation: Secondary | ICD-10-CM | POA: Insufficient documentation

## 2013-05-03 DIAGNOSIS — I2129 ST elevation (STEMI) myocardial infarction involving other sites: Secondary | ICD-10-CM | POA: Insufficient documentation

## 2013-05-03 DIAGNOSIS — Z5189 Encounter for other specified aftercare: Secondary | ICD-10-CM | POA: Insufficient documentation

## 2013-05-03 DIAGNOSIS — I10 Essential (primary) hypertension: Secondary | ICD-10-CM | POA: Insufficient documentation

## 2013-05-05 ENCOUNTER — Encounter (HOSPITAL_COMMUNITY)
Admission: RE | Admit: 2013-05-05 | Discharge: 2013-05-05 | Disposition: A | Discharge status: Home / Self Care | Payer: Medicare Other | Source: Ambulatory Visit | Attending: Interventional Cardiology | Admitting: Interventional Cardiology

## 2013-05-07 ENCOUNTER — Encounter (HOSPITAL_COMMUNITY)
Admission: RE | Admit: 2013-05-07 | Discharge: 2013-05-07 | Disposition: A | Discharge status: Home / Self Care | Payer: Medicare Other | Source: Ambulatory Visit | Attending: Interventional Cardiology | Admitting: Interventional Cardiology

## 2013-05-10 ENCOUNTER — Encounter (HOSPITAL_COMMUNITY)
Admission: RE | Admit: 2013-05-10 | Discharge: 2013-05-10 | Disposition: A | Discharge status: Home / Self Care | Payer: Medicare Other | Source: Ambulatory Visit | Attending: Interventional Cardiology | Admitting: Interventional Cardiology

## 2013-05-12 ENCOUNTER — Encounter (HOSPITAL_COMMUNITY)
Admission: RE | Admit: 2013-05-12 | Discharge: 2013-05-12 | Disposition: A | Discharge status: Home / Self Care | Payer: Medicare Other | Source: Ambulatory Visit | Attending: Interventional Cardiology | Admitting: Interventional Cardiology

## 2013-05-14 ENCOUNTER — Encounter (HOSPITAL_COMMUNITY)
Admission: RE | Admit: 2013-05-14 | Discharge: 2013-05-14 | Disposition: A | Discharge status: Home / Self Care | Payer: Medicare Other | Source: Ambulatory Visit | Attending: Interventional Cardiology | Admitting: Interventional Cardiology

## 2013-05-17 ENCOUNTER — Encounter (HOSPITAL_COMMUNITY)
Admission: RE | Admit: 2013-05-17 | Discharge: 2013-05-17 | Disposition: A | Discharge status: Home / Self Care | Payer: Medicare Other | Source: Ambulatory Visit | Attending: Interventional Cardiology | Admitting: Interventional Cardiology

## 2013-05-19 ENCOUNTER — Encounter (HOSPITAL_COMMUNITY)
Admission: RE | Admit: 2013-05-19 | Discharge: 2013-05-19 | Disposition: A | Discharge status: Home / Self Care | Payer: Medicare Other | Source: Ambulatory Visit | Attending: Interventional Cardiology | Admitting: Interventional Cardiology

## 2013-05-21 ENCOUNTER — Encounter (HOSPITAL_COMMUNITY)
Admission: RE | Admit: 2013-05-21 | Discharge: 2013-05-21 | Disposition: A | Discharge status: Home / Self Care | Payer: Medicare Other | Source: Ambulatory Visit | Attending: Interventional Cardiology | Admitting: Interventional Cardiology

## 2013-05-21 NOTE — Progress Notes (Signed)
[name] graduates today and plans to continue exercise on her own.

## 2013-07-15 ENCOUNTER — Ambulatory Visit: Payer: Medicare Other | Admitting: Interventional Cardiology

## 2013-07-21 ENCOUNTER — Encounter: Payer: Self-pay | Admitting: Interventional Cardiology

## 2013-07-23 ENCOUNTER — Ambulatory Visit (INDEPENDENT_AMBULATORY_CARE_PROVIDER_SITE_OTHER): Payer: Medicare Other | Admitting: Interventional Cardiology

## 2013-07-23 ENCOUNTER — Encounter: Payer: Self-pay | Admitting: Interventional Cardiology

## 2013-07-23 VITALS — BP 147/88 | HR 70 | Ht 66.0 in | Wt 195.8 lb

## 2013-07-23 DIAGNOSIS — I252 Old myocardial infarction: Secondary | ICD-10-CM

## 2013-07-23 DIAGNOSIS — I4891 Unspecified atrial fibrillation: Secondary | ICD-10-CM

## 2013-07-23 DIAGNOSIS — I2510 Atherosclerotic heart disease of native coronary artery without angina pectoris: Secondary | ICD-10-CM

## 2013-07-23 DIAGNOSIS — E785 Hyperlipidemia, unspecified: Secondary | ICD-10-CM

## 2013-07-23 DIAGNOSIS — I10 Essential (primary) hypertension: Secondary | ICD-10-CM

## 2013-07-23 NOTE — Patient Instructions (Addendum)
Your physician recommends that you continue on your current medications as directed. Please refer to the Current Medication list given to you today.  Your physician wants you to follow-up in: 6 months You will receive a reminder letter in the mail two months in advance. If you don't receive a letter, please call our office to schedule the follow-up appointment.  Your physician discussed the importance of regular exercise and recommended that you start or continue a regular exercise program for good health.   

## 2013-07-23 NOTE — Progress Notes (Signed)
Patient ID: [name], female   DOB: Jun 13, 1936, 77 y.o.   MRN: [id number]    [address], [address] [place], [place]  [address] Phone: [phone] Fax:  [phone]  Date:  07/23/2013   ID:  [name], DOB [birth year], MRN [id number]  PCP:  [name]., MD   ASSESSMENT:  1. Coronary artery disease stable without recurrent angina. Prior coronary stents 2. Prior history of atrial arrhythmia, asymptomatic 3. Hypertension 4. Angina pectoris, resolved 5. Hyperlipidemia on [place]  PLAN:  1. continue current medical regimen as listed 2. I endorsed a continued active lifestyle 3. Salt restriction   SUBJECTIVE: [name] is a 77 y.o. female who is doing well. She denies angina. She has not had syncope. Is no peripheral edema. No significant palpitations or transient neurological complaints. No medication side effects.   Wt Readings from Last 3 Encounters:  07/23/13 195 lb 12.8 oz (88.814 kg)  02/11/13 196 lb 6.9 oz (89.1 kg)  01/28/13 196 lb 13.9 oz (89.3 kg)     Past Medical History  Diagnosis Date  . Coronary artery disease   . Hypertension   . Anxiety   . Fibromyalgia   . Dysrhythmia     history of "irregular heart rate"'; pt. thinks it was A-fib  . Vertigo 08/27/2006    [name] 08/27/2006 (01/27/2013)  . Ventricular hypertrophy     [name] 08/27/2006 (01/27/2013)  . High cholesterol   . Anginal pain   . Exertional shortness of breath     "sometimes" (01/27/2013)  . History of stomach ulcers     "years ago" (528/2014)  . GERD (gastroesophageal reflux disease)   . Arthritis     "q where" (01/27/2013)  . Osteoporosis   . Acute myocardial infarction, unspecified site, initial episode of care   . Acute myocardial infarction of other lateral wall, initial episode of care     Current Outpatient Prescriptions  Medication Sig Dispense Refill  . albuterol (PROVENTIL HFA;VENTOLIN HFA) 108 (90 BASE) MCG/ACT inhaler Inhale 2  puffs into the lungs every 6 (six) hours as needed for wheezing.      [name] ALPRAZolam (XANAX) 0.25 MG tablet Take 0.25 mg by mouth 3 (three) times daily as needed. anxiety      . aspirin EC 81 MG tablet Take 81 mg by mouth daily.        . clopidogrel (PLAVIX) 75 MG tablet Take 75 mg by mouth daily.      . fluticasone (FLONASE) 50 MCG/ACT nasal spray Place 2 sprays into the nose daily as needed for rhinitis.      . furosemide (LASIX) 20 MG tablet Take 10 mg by mouth daily.       [name] gabapentin (NEURONTIN) 300 MG capsule Take 300 mg by mouth 2 (two) times daily.        . Ginger, Zingiber officinalis, (GINGER ROOT PO) Take 1 tablet by mouth daily.      . Ibuprofen-Diphenhydramine HCl (ADVIL PM) 200-25 MG CAPS Take 1 tablet by mouth at bedtime as needed (for sleep).      . isosorbide mononitrate (IMDUR) 30 MG 24 hr tablet Take 1 tablet (30 mg total) by mouth daily.  15 tablet  0  . loratadine (CLARITIN) 10 MG tablet Take 10 mg by mouth daily as needed. allergies       . nitroGLYCERIN (NITROSTAT) 0.4 MG SL tablet Place 1 tablet (0.4 mg total) under the tongue every 5 (five) minutes as needed  for chest pain (CP or SOB).  25 tablet  12  . Pitavastatin Calcium 4 MG TABS Take 1 tablet by mouth daily.      . potassium chloride SA (K-DUR,KLOR-CON) 20 MEQ tablet Take 20 mEq by mouth daily.      . Probiotic Product (ALIGN) 4 MG CAPS Take 1 capsule by mouth daily.      . valsartan (DIOVAN) 160 MG tablet Take 160 mg by mouth daily.        . valsartan-hydrochlorothiazide (DIOVAN-HCT) 160-12.5 MG per tablet daily.      [name] zolpidem (AMBIEN) 10 MG tablet Take 5 mg by mouth at bedtime as needed for sleep.      . [DISCONTINUED] ezetimibe (ZETIA) 10 MG tablet Take 10 mg by mouth daily.        . [DISCONTINUED] metoprolol (TOPROL-XL) 50 MG 24 hr tablet Take 50 mg by mouth daily.        . [DISCONTINUED] potassium chloride (KLOR-CON) 20 MEQ packet Take 20 mEq by mouth daily.         No current facility-administered  medications for this visit.    Allergies:   No Known Allergies  Social History:  The patient  reports that she quit smoking about 21 years ago. Her smoking use included Cigarettes. She has a 4.32 pack-year smoking history. She has never used smokeless tobacco. She reports that she does not drink alcohol or use illicit drugs.   ROS:  Please see the history of present illness.   Denies orthopnea, PND, chest pain, edema, and transient neurological complaints   All other systems reviewed and negative.   OBJECTIVE: VS:  BP 147/88  Pulse 70  Ht 5\' 6"  (1.676 m)  Wt 195 lb 12.8 oz (88.814 kg)  BMI 31.62 kg/m2 Well nourished, well developed, in no acute distress, obese HEENT: normal Neck: JVD flat. Carotid bruit absent  Cardiac:  normal S1, S2; RRR; no murmur Lungs:  clear to auscultation bilaterally, no wheezing, rhonchi or rales Abd: soft, nontender, no hepatomegaly Ext: Edema absent. Pulses 2+ Skin: warm and dry Neuro:  CNs 2-12 intact, no focal abnormalities noted  EKG:  Not repeated       Signed, Darci Needle III, MD 07/23/2013 11:19 AM

## 2013-08-22 ENCOUNTER — Emergency Department (HOSPITAL_COMMUNITY): Payer: Medicare Other

## 2013-08-22 ENCOUNTER — Emergency Department (HOSPITAL_COMMUNITY)
Admission: EM | Admit: 2013-08-22 | Discharge: 2013-08-22 | Disposition: A | Discharge status: Home / Self Care | Payer: Medicare Other | Attending: Emergency Medicine | Admitting: Emergency Medicine

## 2013-08-22 ENCOUNTER — Encounter (HOSPITAL_COMMUNITY): Payer: Self-pay | Admitting: Emergency Medicine

## 2013-08-22 DIAGNOSIS — E7800 Pure hypercholesterolemia, unspecified: Secondary | ICD-10-CM | POA: Insufficient documentation

## 2013-08-22 DIAGNOSIS — Z7982 Long term (current) use of aspirin: Secondary | ICD-10-CM | POA: Insufficient documentation

## 2013-08-22 DIAGNOSIS — J3489 Other specified disorders of nose and nasal sinuses: Secondary | ICD-10-CM | POA: Insufficient documentation

## 2013-08-22 DIAGNOSIS — I2510 Atherosclerotic heart disease of native coronary artery without angina pectoris: Secondary | ICD-10-CM | POA: Insufficient documentation

## 2013-08-22 DIAGNOSIS — Z79899 Other long term (current) drug therapy: Secondary | ICD-10-CM | POA: Insufficient documentation

## 2013-08-22 DIAGNOSIS — Z87891 Personal history of nicotine dependence: Secondary | ICD-10-CM | POA: Insufficient documentation

## 2013-08-22 DIAGNOSIS — Z8719 Personal history of other diseases of the digestive system: Secondary | ICD-10-CM | POA: Insufficient documentation

## 2013-08-22 DIAGNOSIS — Z9861 Coronary angioplasty status: Secondary | ICD-10-CM | POA: Insufficient documentation

## 2013-08-22 DIAGNOSIS — Z9089 Acquired absence of other organs: Secondary | ICD-10-CM | POA: Insufficient documentation

## 2013-08-22 DIAGNOSIS — R0789 Other chest pain: Secondary | ICD-10-CM

## 2013-08-22 DIAGNOSIS — M129 Arthropathy, unspecified: Secondary | ICD-10-CM | POA: Insufficient documentation

## 2013-08-22 DIAGNOSIS — R0602 Shortness of breath: Secondary | ICD-10-CM | POA: Insufficient documentation

## 2013-08-22 DIAGNOSIS — R071 Chest pain on breathing: Secondary | ICD-10-CM | POA: Insufficient documentation

## 2013-08-22 DIAGNOSIS — M62838 Other muscle spasm: Secondary | ICD-10-CM

## 2013-08-22 DIAGNOSIS — Z7902 Long term (current) use of antithrombotics/antiplatelets: Secondary | ICD-10-CM | POA: Insufficient documentation

## 2013-08-22 DIAGNOSIS — I252 Old myocardial infarction: Secondary | ICD-10-CM | POA: Insufficient documentation

## 2013-08-22 DIAGNOSIS — F411 Generalized anxiety disorder: Secondary | ICD-10-CM | POA: Insufficient documentation

## 2013-08-22 DIAGNOSIS — I10 Essential (primary) hypertension: Secondary | ICD-10-CM | POA: Insufficient documentation

## 2013-08-22 LAB — BASIC METABOLIC PANEL
BUN: 21 mg/dL (ref 6–23)
Calcium: 9 mg/dL (ref 8.4–10.5)
GFR calc non Af Amer: 69 mL/min — ABNORMAL LOW (ref >90)
Glucose, Bld: 87 mg/dL (ref 70–99)
Sodium: 135 mEq/L (ref 135–145)

## 2013-08-22 LAB — CBC
HCT: 36.3 % (ref 36.0–46.0)
Hemoglobin: 12.1 g/dL (ref 12.0–15.0)
MCH: 27.9 pg (ref 26.0–34.0)
MCHC: 33.3 g/dL (ref 30.0–36.0)
MCV: 83.6 fL (ref 78.0–100.0)
RDW: 14.6 % (ref 11.5–15.5)

## 2013-08-22 MED ORDER — MORPHINE SULFATE 4 MG/ML IJ SOLN: 4.0000 mg | Freq: Once | INTRAMUSCULAR | Status: DC

## 2013-08-22 MED ORDER — ONDANSETRON HCL 4 MG/2ML IJ SOLN: 4.0000 mg | Freq: Once | INTRAMUSCULAR | Status: DC

## 2013-08-22 MED ORDER — KETOROLAC TROMETHAMINE 60 MG/2ML IM SOLN
30.0000 mg | Freq: Once | INTRAMUSCULAR | Status: AC
  Administered 2013-08-22: 30 mg via INTRAMUSCULAR
  Filled 2013-08-22: qty 2

## 2013-08-22 MED ORDER — CYCLOBENZAPRINE HCL 10 MG PO TABS: 10.0000 mg | ORAL_TABLET | Freq: Two times a day (BID) | ORAL | Status: DC | PRN

## 2013-08-22 MED ORDER — DIAZEPAM 5 MG PO TABS
5.0000 mg | ORAL_TABLET | Freq: Once | ORAL | Status: AC
  Administered 2013-08-22: 5 mg via ORAL
  Filled 2013-08-22: qty 1

## 2013-08-22 MED ORDER — HYDROCODONE-ACETAMINOPHEN 5-325 MG PO TABS: 1.0000 | ORAL_TABLET | ORAL | Status: DC | PRN

## 2013-08-22 NOTE — ED Provider Notes (Signed)
CSN: [id number]     Arrival date & time 08/22/13  1132 History   First MD Initiated Contact with Patient 08/22/13 1143     Chief Complaint  Patient presents with  . Chest Pain  . Shortness of Breath   (Consider location/radiation/quality/duration/timing/severity/associated sxs/prior Treatment) HPI Comments: Patient presents to the ER for evaluation of right-sided chest pain. Symptoms began yesterday. Patient reports sharp stabbing pain in the right side of her chest which radiates through into her. This pain is related to movement. When she bends, twists or raises her right arm, the pain occurs. In between episodes of severe pain, she does not have any significant discomfort. She does feel slightly short of breath. No fever or cough.  Patient reports that she has been experiencing sinus congestion for more than a week. She was seen by her primary care doctor in the office and given a shot of steroid and started on Augmentin. She has had some loose stools since starting the Augmentin. No vomiting or diarrhea.  Patient is a 77 y.o. female presenting with chest pain and shortness of breath.  Chest Pain Associated symptoms: shortness of breath   Shortness of Breath Associated symptoms: chest pain     Past Medical History  Diagnosis Date  . Coronary artery disease   . Hypertension   . Anxiety   . Fibromyalgia   . Dysrhythmia     history of "irregular heart rate"'; pt. thinks it was A-fib  . Vertigo 08/27/2006    [name] 08/27/2006 (01/27/2013)  . Ventricular hypertrophy     [name] 08/27/2006 (01/27/2013)  . High cholesterol   . Anginal pain   . Exertional shortness of breath     "sometimes" (01/27/2013)  . History of stomach ulcers     "years ago" (528/2014)  . GERD (gastroesophageal reflux disease)   . Arthritis     "q where" (01/27/2013)  . Osteoporosis   . Acute myocardial infarction, unspecified site, initial episode of care   . Acute myocardial infarction of other lateral wall,  initial episode of care    Past Surgical History  Procedure Laterality Date  . Hernia repair    . Laparotomy      "day after oophorectomy; had bowel obstruction" (01/27/2013)  . Coronary angioplasty with stent placement  06/2005; 01/27/2013    stent to LAD/notes 08/27/2006; "+ 1 stent today" (01/27/2013)  . Cardiac catheterization  10/2006    [name] 08/03/2007 (01/27/2013)  . Tonsillectomy and adenoidectomy  ~ 1952  . Abdominal hysterectomy  1970's  . Laparoscopic salpingo oopherectomy  2000  . Diagnostic laparoscopy  2000    lap abdominal lysis of adhesions  . Breast lumpectomy Right 1970's    benign   . Breast biopsy Right 1970's   No family history on file. History  Substance Use Topics  . Smoking status: Former Smoker -- 0.12 packs/day for 36 years    Types: Cigarettes    Quit date: 12/02/1991  . Smokeless tobacco: Never Used  . Alcohol Use: No   OB History   Grav Para Term Preterm Abortions TAB SAB Ect Mult Living                 Review of Systems  HENT: Positive for congestion and sinus pressure.   Respiratory: Positive for shortness of breath.   Cardiovascular: Positive for chest pain.  All other systems reviewed and are negative.    Allergies  Review of patient's allergies indicates no known allergies.  Home Medications  Current Outpatient Rx  Name  Route  Sig  Dispense  Refill  . albuterol (PROVENTIL HFA;VENTOLIN HFA) 108 (90 BASE) MCG/ACT inhaler   Inhalation   Inhale 2 puffs into the lungs every 6 (six) hours as needed for wheezing.         [name] ALPRAZolam (XANAX) 0.25 MG tablet   Oral   Take 0.25 mg by mouth 3 (three) times daily as needed. anxiety         . aspirin EC 81 MG tablet   Oral   Take 81 mg by mouth daily.           . clopidogrel (PLAVIX) 75 MG tablet   Oral   Take 75 mg by mouth daily.         . fluticasone (FLONASE) 50 MCG/ACT nasal spray   Nasal   Place 2 sprays into the nose daily as needed for rhinitis.         .  furosemide (LASIX) 20 MG tablet   Oral   Take 10 mg by mouth daily.          [name] gabapentin (NEURONTIN) 300 MG capsule   Oral   Take 300 mg by mouth 2 (two) times daily.           . Ginger, Zingiber officinalis, (GINGER ROOT PO)   Oral   Take 1 tablet by mouth daily.         . Ibuprofen-Diphenhydramine HCl (ADVIL PM) 200-25 MG CAPS   Oral   Take 1 tablet by mouth at bedtime as needed (for sleep).         . isosorbide mononitrate (IMDUR) 30 MG 24 hr tablet   Oral   Take 1 tablet (30 mg total) by mouth daily.   15 tablet   0   . loratadine (CLARITIN) 10 MG tablet   Oral   Take 10 mg by mouth daily as needed. allergies          . nitroGLYCERIN (NITROSTAT) 0.4 MG SL tablet   Sublingual   Place 1 tablet (0.4 mg total) under the tongue every 5 (five) minutes as needed for chest pain (CP or SOB).   25 tablet   12   . Pitavastatin Calcium 4 MG TABS   Oral   Take 1 tablet by mouth daily.         . potassium chloride SA (K-DUR,KLOR-CON) 20 MEQ tablet   Oral   Take 20 mEq by mouth daily.         . Probiotic Product (ALIGN) 4 MG CAPS   Oral   Take 1 capsule by mouth daily.         . valsartan (DIOVAN) 160 MG tablet   Oral   Take 160 mg by mouth daily.           . valsartan-hydrochlorothiazide (DIOVAN-HCT) 160-12.5 MG per tablet      daily.         [name] zolpidem (AMBIEN) 10 MG tablet   Oral   Take 5 mg by mouth at bedtime as needed for sleep.          BP 143/78  Pulse 73  Temp(Src) 98.1 F (36.7 C) (Oral)  Resp 16  Ht 5\' 7"  (1.702 m)  Wt 190 lb (86.183 kg)  BMI 29.75 kg/m2  SpO2 100% Physical Exam  Constitutional: She is oriented to person, place, and time. She appears well-developed and well-nourished. No distress.  HENT:  Head: Normocephalic and atraumatic.  Right Ear: Hearing normal.  Left Ear: Hearing normal.  Nose: Nose normal.  Mouth/Throat: Oropharynx is clear and moist and mucous membranes are normal.  Eyes: Conjunctivae and EOM  are normal. Pupils are equal, round, and reactive to light.  Neck: Normal range of motion. Neck supple.  Cardiovascular: Regular rhythm, S1 normal and S2 normal.  Exam reveals no gallop and no friction rub.   No murmur heard. Pulmonary/Chest: Effort normal and breath sounds normal. No respiratory distress. She exhibits tenderness.    Abdominal: Soft. Normal appearance and bowel sounds are normal. There is no hepatosplenomegaly. There is no tenderness. There is no rebound, no guarding, no tenderness at McBurney's point and negative Murphy's sign. No hernia.  Musculoskeletal: Normal range of motion.  Neurological: She is alert and oriented to person, place, and time. She has normal strength. No cranial nerve deficit or sensory deficit. Coordination normal. GCS eye subscore is 4. GCS verbal subscore is 5. GCS motor subscore is 6.  Skin: Skin is warm, dry and intact. No rash noted. No cyanosis.  Psychiatric: She has a normal mood and affect. Her speech is normal and behavior is normal. Thought content normal.    ED Course  Procedures (including critical care time) Labs Review Labs Reviewed  CBC  BASIC METABOLIC PANEL  PRO B NATRIURETIC PEPTIDE   Imaging Review No results found.  EKG Interpretation    Date/Time:  Sunday August 22 2013 11:38:17 EST Ventricular Rate:  74 PR Interval:  134 QRS Duration: 72 QT Interval:  362 QTC Calculation: 401 R Axis:   58 Text Interpretation:  Normal sinus rhythm Nonspecific T wave abnormality Abnormal ECG No significant change since last tracing Confirmed by POLLINA  MD, CHRISTOPHER ([id number]) on 08/22/2013 11:54:33 AM            MDM  Diagnosis: 1. Chest Wall Pain 2. Acute muscle spasm  She presents to the ER for evaluation of intermittent right-sided chest pain. The pain is sharp and stabbing in nature and is exacerbated by movements of the torso. Patient reports that she did scrap her carpet with her right arm just prior to onset of pain,  but did not notice any pain with that. Her examination is consistent with acute muscular pain. Patient has severe paroxysmal spasm of the chest wall when she moves her torso or when she raises her arm. She does have a cardiac history, but reports that this is entirely different than the chest pain she had with her heart. Her cardiac workup is unremarkable today. Patient treated with Toradol and Valium with improvement of the spasms.  Patient did report shortness of breath, but further questioning says that the shortness of breath in conjunction with the Roxicet for pain. She is currently comfortable without any difficulty breathing. Denies any concern for PE based on the patient's current evaluation.  The patient will be discharged, muscle relaxers and analgesics. Followup with her doctor. Return if her symptoms worsen.    Gilda Crease, MD 08/22/13 1357

## 2013-08-22 NOTE — ED Notes (Signed)
Pt reports while in the bathroom with diarrhea started to experience left side chest pain that radiates to back between shoulder blades. Pt reports shortness of breath with pain. Pt talking in complete sentences. Pt denies edema.

## 2013-08-22 NOTE — ED Notes (Signed)
Pt c/o rt sided cp that radiates to the back that gets worse when she moves and raises her rt arm. Pt states she thinks she may have pulled a muscle. Pt also states she has some sob. Pt rates pain 10/10. Pt crying and grimaces when she moves.

## 2013-09-11 ENCOUNTER — Ambulatory Visit (INDEPENDENT_AMBULATORY_CARE_PROVIDER_SITE_OTHER): Payer: Medicare Other | Admitting: Family Medicine

## 2013-09-11 VITALS — BP 136/80 | HR 88 | Temp 98.1°F | Resp 18 | Ht 66.0 in | Wt 195.0 lb

## 2013-09-11 DIAGNOSIS — R319 Hematuria, unspecified: Secondary | ICD-10-CM

## 2013-09-11 DIAGNOSIS — N23 Unspecified renal colic: Secondary | ICD-10-CM

## 2013-09-11 DIAGNOSIS — R309 Painful micturition, unspecified: Secondary | ICD-10-CM

## 2013-09-11 DIAGNOSIS — N10 Acute tubulo-interstitial nephritis: Secondary | ICD-10-CM

## 2013-09-11 DIAGNOSIS — R3915 Urgency of urination: Secondary | ICD-10-CM

## 2013-09-11 LAB — POCT URINALYSIS DIPSTICK
Bilirubin, UA: NEGATIVE
Glucose, UA: NEGATIVE
Ketones, UA: NEGATIVE
Nitrite, UA: POSITIVE
Protein, UA: 30
Spec Grav, UA: 1.015
Urobilinogen, UA: 0.2
pH, UA: 5.5

## 2013-09-11 LAB — POCT UA - MICROSCOPIC ONLY
Casts, Ur, LPF, POC: NEGATIVE
Crystals, Ur, HPF, POC: NEGATIVE
Mucus, UA: NEGATIVE
Yeast, UA: NEGATIVE

## 2013-09-11 MED ORDER — CEFTRIAXONE SODIUM 1 G IJ SOLR
1.0000 g | INTRAMUSCULAR | Status: DC
  Administered 2013-09-11: 1 g via INTRAMUSCULAR

## 2013-09-11 MED ORDER — LEVOFLOXACIN 500 MG PO TABS: 500.0000 mg | ORAL_TABLET | Freq: Every day | ORAL | Status: DC

## 2013-09-11 NOTE — Patient Instructions (Signed)

## 2013-09-11 NOTE — Progress Notes (Signed)
Patient ID: [name] MRN: [id number], DOB: 09-05-1935, 78 y.o. Date of Encounter: 09/11/2013, 3:38 PM  Primary Physician: [name]., MD  Chief Complaint:  Chief Complaint  Patient presents with  . Urinary Tract Infection    pain, urge, blood x 2 days     HPI: 78 y.o. year old female presents with 60 day history of dysuria, urgency, and frequency. Last UTI was November Some hematuria this morning  No sick contacts, recent antibiotics, or recent travels.   No vaginal discharge, back pain, fever  Past Medical History  Diagnosis Date  . Coronary artery disease   . Hypertension   . Anxiety   . Fibromyalgia   . Dysrhythmia     history of "irregular heart rate"'; pt. thinks it was A-fib  . Vertigo 08/27/2006    [name] 08/27/2006 (01/27/2013)  . Ventricular hypertrophy     [name] 08/27/2006 (01/27/2013)  . High cholesterol   . Anginal pain   . Exertional shortness of breath     "sometimes" (01/27/2013)  . History of stomach ulcers     "years ago" (528/2014)  . GERD (gastroesophageal reflux disease)   . Arthritis     "q where" (01/27/2013)  . Osteoporosis   . Acute myocardial infarction, unspecified site, initial episode of care   . Acute myocardial infarction of other lateral wall, initial episode of care      Home Meds: Prior to Admission medications   Medication Sig Start Date End Date Taking? Authorizing Provider  albuterol (PROVENTIL HFA;VENTOLIN HFA) 108 (90 BASE) MCG/ACT inhaler Inhale 2 puffs into the lungs every 6 (six) hours as needed for wheezing.   Yes Historical Provider, MD  ALPRAZolam (XANAX) 0.25 MG tablet Take 0.25 mg by mouth 3 (three) times daily as needed. anxiety   Yes Historical Provider, MD  aspirin EC 81 MG tablet Take 81 mg by mouth daily.     Yes Historical Provider, MD  clopidogrel (PLAVIX) 75 MG tablet Take 75 mg by mouth daily.   Yes Historical Provider, MD  fluticasone (FLONASE) 50 MCG/ACT nasal spray Place 2 sprays into  the nose daily as needed for rhinitis.   Yes Historical Provider, MD  furosemide (LASIX) 20 MG tablet Take 10 mg by mouth daily.    Yes Historical Provider, MD  gabapentin (NEURONTIN) 300 MG capsule Take 300 mg by mouth 2 (two) times daily.     Yes Historical Provider, MD  loratadine (CLARITIN) 10 MG tablet Take 10 mg by mouth daily as needed. allergies    Yes Historical Provider, MD  nitroGLYCERIN (NITROSTAT) 0.4 MG SL tablet Place 1 tablet (0.4 mg total) under the tongue every 5 (five) minutes as needed for chest pain (CP or SOB). 01/28/13  Yes Lyn Records III, MD  Pitavastatin Calcium 4 MG TABS Take 1 tablet by mouth daily.   Yes Historical Provider, MD  potassium chloride SA (K-DUR,KLOR-CON) 20 MEQ tablet Take 20 mEq by mouth daily.   Yes Historical Provider, MD  Probiotic Product (ALIGN) 4 MG CAPS Take 1 capsule by mouth daily.   Yes Historical Provider, MD  valsartan-hydrochlorothiazide (DIOVAN-HCT) 160-12.5 MG per tablet Take 1 tablet by mouth daily.  07/05/13  Yes Historical Provider, MD  zolpidem (AMBIEN) 10 MG tablet Take 5 mg by mouth at bedtime as needed for sleep.   Yes Historical Provider, MD  amoxicillin-clavulanate (AUGMENTIN) 875-125 MG per tablet Take 1 tablet by mouth 2 (two) times daily. 08/18/13   Historical Provider, MD  cyclobenzaprine (FLEXERIL) 10 MG tablet Take 1 tablet (10 mg total) by mouth 2 (two) times daily as needed for muscle spasms. 08/22/13   Gilda Creasehristopher J. Pollina, MD  HYDROcodone-acetaminophen (NORCO/VICODIN) 5-325 MG per tablet Take 1 tablet by mouth every 4 (four) hours as needed. 08/22/13   Gilda Creasehristopher J. Pollina, MD    Allergies:  Allergies  Allergen Reactions  . Codeine Itching    History   Social History  . Marital Status: Married    Spouse Name: N/A    Number of Children: N/A  . Years of Education: N/A   Occupational History  . Not on file.   Social History Main Topics  . Smoking status: Former Smoker -- 0.12 packs/day for 36 years     Types: Cigarettes    Quit date: 12/02/1991  . Smokeless tobacco: Never Used  . Alcohol Use: No  . Drug Use: No  . Sexual Activity: Not Currently    Birth Control/ Protection: Surgical   Other Topics Concern  . Not on file   Social History Narrative  . No narrative on file     Review of Systems: Constitutional: negative for chills, fever, night sweats or weight changes Cardiovascular: negative for chest pain or palpitations Respiratory: negative for hemoptysis, wheezing, or shortness of breath Abdominal: negative for abdominal pain, nausea, vomiting or diarrhea Dermatological: negative for rash Neurologic: negative for headache   Physical Exam: Blood pressure 136/80, pulse 88, temperature 98.1 F (36.7 C), temperature source Oral, resp. rate 18, height 5\' 6"  (1.676 m), weight 195 lb (88.451 kg), SpO2 96.00%., Body mass index is 31.49 kg/(m^2). General: Well developed, well nourished, in no acute distress. Head: Normocephalic, atraumatic, eyes without discharge, sclera non-icteric, nares are congested. Bilateral auditory canals clear, TM's are without perforation, pearly grey with reflective cone of light bilaterally. Serous effusion bilaterally behind TM's. Maxillary sinus TTP. Oral cavity moist, dentition normal. Posterior pharynx with post nasal drip and mild erythema. No peritonsillar abscess or tonsillar exudate. Neck: Supple. No thyromegaly. Full ROM. No lymphadenopathy. Lungs: Coarse breath sounds bilaterally without Clear bilaterally to auscultation without wheezes, rales, or rhonchi. Breathing is unlabored.  Heart: RRR with S1 S2. No murmurs, rubs, or gallops appreciated. Abdomen: Soft, non-tender, non-distended with normoactive bowel sounds. No hepatosplenomegaly. No rebound/guarding. No obvious abdominal masses. McBurney's, Rovsing's, Iliopsoas, and table jar all negative. Msk:  Strength and tone normal for age. Extremities: No clubbing or cyanosis. No edema. Neuro: Alert  and oriented X 3. Moves all extremities spontaneously. CNII-XII grossly in tact. Psych:  Responds to questions appropriately with a normal affect.   Labs: Results for orders placed in visit on 09/11/13  POCT UA - MICROSCOPIC ONLY      Result Value Range   WBC, Ur, HPF, POC tntc     RBC, urine, microscopic 3-5     Bacteria, U Microscopic 1+     Mucus, UA neg     Epithelial cells, urine per micros 2-6     Crystals, Ur, HPF, POC neg     Casts, Ur, LPF, POC neg     Yeast, UA neg    POCT URINALYSIS DIPSTICK      Result Value Range   Color, UA orange     Clarity, UA cloudy     Glucose, UA neg     Bilirubin, UA neg     Ketones, UA neg     Spec Grav, UA 1.015     Blood, UA small     pH, UA  5.5     Protein, UA 30     Urobilinogen, UA 0.2     Nitrite, UA positive     Leukocytes, UA moderate (2+)        ASSESSMENT AND PLAN:  78 y.o. year old female with Blood in urine - Plan: POCT UA - Microscopic Only, POCT urinalysis dipstick, Urine culture, levofloxacin (LEVAQUIN) 500 MG tablet, cefTRIAXone (ROCEPHIN) injection 1 g  Urgency of urination - Plan: POCT UA - Microscopic Only, POCT urinalysis dipstick, Urine culture, levofloxacin (LEVAQUIN) 500 MG tablet, cefTRIAXone (ROCEPHIN) injection 1 g  Pain with urination - Plan: POCT UA - Microscopic Only, POCT urinalysis dipstick, Urine culture, levofloxacin (LEVAQUIN) 500 MG tablet, cefTRIAXone (ROCEPHIN) injection 1 g  Acute pyelonephritis   - -Mucinex -Tylenol/Motrin prn -Rest/fluids -RTC precautions -RTC 3-5 days if no improvement  Signed, Elvina Sidle, MD 09/11/2013 3:38 PM

## 2013-09-13 LAB — URINE CULTURE: Colony Count: 50000

## 2013-10-02 ENCOUNTER — Ambulatory Visit: Payer: Medicare Other

## 2013-10-02 ENCOUNTER — Ambulatory Visit (INDEPENDENT_AMBULATORY_CARE_PROVIDER_SITE_OTHER): Payer: Medicare Other | Admitting: Family Medicine

## 2013-10-02 VITALS — BP 134/72 | HR 72 | Temp 97.9°F | Resp 18 | Ht 66.0 in | Wt 194.6 lb

## 2013-10-02 DIAGNOSIS — R05 Cough: Secondary | ICD-10-CM

## 2013-10-02 DIAGNOSIS — R059 Cough, unspecified: Secondary | ICD-10-CM

## 2013-10-02 LAB — POCT CBC
Granulocyte percent: 57.2 %G (ref 37–80)
HCT, POC: 41.3 % (ref 37.7–47.9)
Hemoglobin: 12.6 g/dL (ref 12.2–16.2)
Lymph, poc: 2.5 (ref 0.6–3.4)
MCH, POC: 27.3 pg (ref 27–31.2)
MCHC: 30.5 g/dL — AB (ref 31.8–35.4)
MCV: 89.5 fL (ref 80–97)
MID (cbc): 0.4 (ref 0–0.9)
MPV: 7.4 fL (ref 0–99.8)
POC Granulocyte: 3.9 (ref 2–6.9)
POC LYMPH PERCENT: 37.4 %L (ref 10–50)
POC MID %: 5.4 %M (ref 0–12)
Platelet Count, POC: 261 10*3/uL (ref 142–424)
RBC: 4.61 M/uL (ref 4.04–5.48)
RDW, POC: 14.9 %
WBC: 6.8 10*3/uL (ref 4.6–10.2)

## 2013-10-02 MED ORDER — BENZONATATE 200 MG PO CAPS
200.0000 mg | ORAL_CAPSULE | Freq: Three times a day (TID) | ORAL | Status: DC | PRN
Start: 2013-10-02 — End: 2013-12-02

## 2013-10-02 MED ORDER — AZITHROMYCIN 250 MG PO TABS: ORAL_TABLET | ORAL | Status: DC

## 2013-10-02 NOTE — Patient Instructions (Signed)

## 2013-10-02 NOTE — Progress Notes (Addendum)
Subjective:    Patient ID: [name], female    DOB: 1935/11/01, 78 y.o.   MRN: [id number]  HPI This chart was scribed for Elvina Sidle, MD by Andrew Au, ED Scribe. This patient was seen in room 2 and the patient's care was started at 12:26 PM.  HPI Comments: [name] is a 78 y.o. female who presents to the [facility] complaining of constant, productive cough onset 4 days with associated chills, SOB, fatigue, and sweats, and mild nausea one day ago. She reports that's she thought she was having a stroke this morning. She reports that she had pain in her jaw and swelling in her mid back. She reports that she was seen 3 weeks ago for a UTI and that her symptoms have subsided.  She denies fever and vomiting.   Past Medical History  Diagnosis Date   Coronary artery disease    Hypertension    Anxiety    Fibromyalgia    Dysrhythmia     history of "irregular heart rate"'; pt. thinks it was A-fib   Vertigo 08/27/2006    [name] 08/27/2006 (01/27/2013)   Ventricular hypertrophy     [name] 08/27/2006 (01/27/2013)   High cholesterol    Anginal pain    Exertional shortness of breath     "sometimes" (01/27/2013)   History of stomach ulcers     "years ago" (528/2014)   GERD (gastroesophageal reflux disease)    Arthritis     "q where" (01/27/2013)   Osteoporosis    Acute myocardial infarction, unspecified site, initial episode of care    Acute myocardial infarction of other lateral wall, initial episode of care    Allergies  Allergen Reactions   Codeine Itching   Prior to Admission medications   Medication Sig Start Date End Date Taking? Authorizing Provider  albuterol (PROVENTIL HFA;VENTOLIN HFA) 108 (90 BASE) MCG/ACT inhaler Inhale 2 puffs into the lungs every 6 (six) hours as needed for wheezing.   Yes Historical Provider, MD  ALPRAZolam (XANAX) 0.25 MG tablet Take 0.25 mg by mouth 3 (three) times daily as needed. anxiety    Yes Historical Provider, MD  aspirin EC 81 MG tablet Take 81 mg by mouth daily.     Yes Historical Provider, MD  clopidogrel (PLAVIX) 75 MG tablet Take 75 mg by mouth daily.   Yes Historical Provider, MD  fluticasone (FLONASE) 50 MCG/ACT nasal spray Place 2 sprays into the nose daily as needed for rhinitis.   Yes Historical Provider, MD  furosemide (LASIX) 20 MG tablet Take 10 mg by mouth daily.    Yes Historical Provider, MD  gabapentin (NEURONTIN) 300 MG capsule Take 300 mg by mouth 2 (two) times daily.     Yes Historical Provider, MD  loratadine (CLARITIN) 10 MG tablet Take 10 mg by mouth daily as needed. allergies    Yes Historical Provider, MD  nitroGLYCERIN (NITROSTAT) 0.4 MG SL tablet Place 1 tablet (0.4 mg total) under the tongue every 5 (five) minutes as needed for chest pain (CP or SOB). 01/28/13  Yes Lyn Records III, MD  potassium chloride SA (K-DUR,KLOR-CON) 20 MEQ tablet Take 20 mEq by mouth daily.   Yes Historical Provider, MD  Probiotic Product (ALIGN) 4 MG CAPS Take 1 capsule by mouth daily.   Yes Historical Provider, MD  valsartan-hydrochlorothiazide (DIOVAN-HCT) 160-12.5 MG per tablet Take 1 tablet by mouth daily.  07/05/13  Yes Historical Provider, MD  zolpidem (AMBIEN) 10 MG tablet  Take 5 mg by mouth at bedtime as needed for sleep.   Yes Historical Provider, MD   Review of Systems  Constitutional: Positive for chills. Negative for fever.  Respiratory: Positive for cough and shortness of breath.   Gastrointestinal: Positive for nausea. Negative for vomiting.  Musculoskeletal: Positive for back pain.       Objective:   Physical Exam Filed Vitals:   10/02/13 1134  BP: 134/72  Pulse: 72  Temp: 97.9 F (36.6 C)  Resp: 18   HEENT: Unremarkable General appearance: Patient appears tired Chest: Few rhonchi otherwise clear Heart: Regular no murmur Skin: Clear of rash, good color of nails  Results for orders placed in visit on 10/02/13  POCT CBC      Result Value Range    WBC 6.8  4.6 - 10.2 K/uL   Lymph, poc 2.5  0.6 - 3.4   POC LYMPH PERCENT 37.4  10 - 50 %L   MID (cbc) 0.4  0 - 0.9   POC MID % 5.4  0 - 12 %M   POC Granulocyte 3.9  2 - 6.9   Granulocyte percent 57.2  37 - 80 %G   RBC 4.61  4.04 - 5.48 M/uL   Hemoglobin 12.6  12.2 - 16.2 g/dL   HCT, POC 16.141.3  09.637.7 - 47.9 %   MCV 89.5  80 - 97 fL   MCH, POC 27.3  27 - 31.2 pg   MCHC 30.5 (*) 31.8 - 35.4 g/dL   RDW, POC 04.514.9     Platelet Count, POC 261  142 - 424 K/uL   MPV 7.4  0 - 99.8 fL   [facility] reading (PRIMARY) by  Dr. Milus GlazierLauenstein:  Normal chest      Assessment & Plan:    Patient has acute bronchitis. She does not seem to have any respiratory compromise  Plan: Z-Pak and Hydromet Cough - Plan: POCT CBC, DG Chest 2 View, azithromycin (ZITHROMAX Z-PAK) 250 MG tablet, benzonatate (TESSALON) 200 MG capsule  Signed, Elvina SidleKurt Lauenstein, MD    Signed, Sheila OatsKurt Lauenstein M.D.

## 2013-12-02 ENCOUNTER — Encounter (HOSPITAL_COMMUNITY): Payer: Self-pay | Admitting: Emergency Medicine

## 2013-12-02 ENCOUNTER — Ambulatory Visit (INDEPENDENT_AMBULATORY_CARE_PROVIDER_SITE_OTHER): Payer: Medicare Other | Admitting: Emergency Medicine

## 2013-12-02 ENCOUNTER — Emergency Department (HOSPITAL_COMMUNITY)
Admission: EM | Admit: 2013-12-02 | Discharge: 2013-12-03 | Disposition: A | Discharge status: Home / Self Care | Payer: Medicare Other | Attending: Emergency Medicine | Admitting: Emergency Medicine

## 2013-12-02 ENCOUNTER — Emergency Department (HOSPITAL_COMMUNITY): Payer: Medicare Other

## 2013-12-02 VITALS — BP 160/80 | HR 76 | Temp 97.8°F | Resp 20 | Ht 66.0 in | Wt 203.2 lb

## 2013-12-02 DIAGNOSIS — Z7982 Long term (current) use of aspirin: Secondary | ICD-10-CM | POA: Insufficient documentation

## 2013-12-02 DIAGNOSIS — I10 Essential (primary) hypertension: Secondary | ICD-10-CM | POA: Insufficient documentation

## 2013-12-02 DIAGNOSIS — IMO0001 Reserved for inherently not codable concepts without codable children: Secondary | ICD-10-CM

## 2013-12-02 DIAGNOSIS — Z9889 Other specified postprocedural states: Secondary | ICD-10-CM | POA: Insufficient documentation

## 2013-12-02 DIAGNOSIS — M810 Age-related osteoporosis without current pathological fracture: Secondary | ICD-10-CM | POA: Insufficient documentation

## 2013-12-02 DIAGNOSIS — Z7902 Long term (current) use of antithrombotics/antiplatelets: Secondary | ICD-10-CM | POA: Insufficient documentation

## 2013-12-02 DIAGNOSIS — F411 Generalized anxiety disorder: Secondary | ICD-10-CM | POA: Insufficient documentation

## 2013-12-02 DIAGNOSIS — R9431 Abnormal electrocardiogram [ECG] [EKG]: Secondary | ICD-10-CM

## 2013-12-02 DIAGNOSIS — Z87891 Personal history of nicotine dependence: Secondary | ICD-10-CM | POA: Insufficient documentation

## 2013-12-02 DIAGNOSIS — N390 Urinary tract infection, site not specified: Secondary | ICD-10-CM

## 2013-12-02 DIAGNOSIS — M791 Myalgia, unspecified site: Secondary | ICD-10-CM

## 2013-12-02 DIAGNOSIS — M7989 Other specified soft tissue disorders: Secondary | ICD-10-CM

## 2013-12-02 DIAGNOSIS — Z9861 Coronary angioplasty status: Secondary | ICD-10-CM | POA: Insufficient documentation

## 2013-12-02 DIAGNOSIS — Z79899 Other long term (current) drug therapy: Secondary | ICD-10-CM | POA: Insufficient documentation

## 2013-12-02 DIAGNOSIS — M542 Cervicalgia: Secondary | ICD-10-CM | POA: Insufficient documentation

## 2013-12-02 DIAGNOSIS — I2510 Atherosclerotic heart disease of native coronary artery without angina pectoris: Secondary | ICD-10-CM | POA: Insufficient documentation

## 2013-12-02 DIAGNOSIS — R300 Dysuria: Secondary | ICD-10-CM

## 2013-12-02 DIAGNOSIS — I252 Old myocardial infarction: Secondary | ICD-10-CM | POA: Insufficient documentation

## 2013-12-02 DIAGNOSIS — K219 Gastro-esophageal reflux disease without esophagitis: Secondary | ICD-10-CM | POA: Insufficient documentation

## 2013-12-02 DIAGNOSIS — M129 Arthropathy, unspecified: Secondary | ICD-10-CM | POA: Insufficient documentation

## 2013-12-02 DIAGNOSIS — E7800 Pure hypercholesterolemia, unspecified: Secondary | ICD-10-CM | POA: Insufficient documentation

## 2013-12-02 DIAGNOSIS — IMO0002 Reserved for concepts with insufficient information to code with codable children: Secondary | ICD-10-CM | POA: Insufficient documentation

## 2013-12-02 LAB — POCT UA - MICROSCOPIC ONLY
Casts, Ur, LPF, POC: NEGATIVE
Crystals, Ur, HPF, POC: NEGATIVE
Mucus, UA: NEGATIVE
Yeast, UA: NEGATIVE

## 2013-12-02 LAB — POCT URINALYSIS DIPSTICK
BILIRUBIN UA: NEGATIVE
Blood, UA: NEGATIVE
GLUCOSE UA: NEGATIVE
Ketones, UA: NEGATIVE
NITRITE UA: NEGATIVE
Protein, UA: NEGATIVE
SPEC GRAV UA: 1.015
UROBILINOGEN UA: 0.2
pH, UA: 7

## 2013-12-02 LAB — POCT CBC
Granulocyte percent: 52.2 %G (ref 37–80)
HCT, POC: 40.9 % (ref 37.7–47.9)
Hemoglobin: 12.5 g/dL (ref 12.2–16.2)
LYMPH, POC: 2.6 (ref 0.6–3.4)
MCH: 26.8 pg — AB (ref 27–31.2)
MCHC: 30.6 g/dL — AB (ref 31.8–35.4)
MCV: 87.7 fL (ref 80–97)
MID (CBC): 0.4 (ref 0–0.9)
MPV: 7 fL (ref 0–99.8)
PLATELET COUNT, POC: 313 10*3/uL (ref 142–424)
POC Granulocyte: 3.2 (ref 2–6.9)
POC LYMPH PERCENT: 41.2 %L (ref 10–50)
POC MID %: 6.6 % (ref 0–12)
RBC: 4.66 M/uL (ref 4.04–5.48)
RDW, POC: 16.4 %
WBC: 6.2 10*3/uL (ref 4.6–10.2)

## 2013-12-02 MED ORDER — CEPHALEXIN 500 MG PO CAPS: 500.0000 mg | ORAL_CAPSULE | Freq: Two times a day (BID) | ORAL | Status: DC

## 2013-12-02 NOTE — Progress Notes (Signed)
This chart was scribed for Viviann SpareSteven A. Cleta Albertsaub, MD by Ashley JacobsBrittany Andrews, [facility] Scribe. The patient was seen in room and the patient's care was started at 9:38 PM.   Otalgia  Associated symptoms include abdominal pain and neck pain.  Abdominal Pain Associated symptoms include myalgias.  Urinary Tract Infection    HPI Comments: [name] is a 78 y.o. female who arrives to the [facility] complaining of recurrent unchanged UTI symptoms, onset three months ago and has gotten worse two days ago. She has the associated symptoms of abdominal pain.Pt went her PCP and was given Amoxicillin and two other antibiotics with no improvement of symptoms. She has tried AZO to no relief. She has constant, moderate bilateral ear pain and neck pain, onset yesterday . Pt mentions having shooting leg pain with swelling. She has varicose veins and wears compression socks. Pt has x-rays of her legs two years ago by Dr. Katrinka BlazingSmith. She has constant, moderate  right foot swelling and generalized myalgia. Nothing seems to make her symptoms better. She has not seen a urinologist. Pt cries from the pain. Denies depression and stress at home. Denies chest pain and SOB.   Review of Systems  HENT: Positive for ear pain.   Respiratory: Negative for shortness of breath.   Cardiovascular: Positive for leg swelling. Negative for chest pain.  Gastrointestinal: Positive for abdominal pain.  Musculoskeletal: Positive for myalgias and neck pain.    Physical Exam patient in the room crying complaining of severe pain in her neck and ears. Her HEENT exam is actually unremarkable. Her neck was supple her chest was clear heart was was without murmur but there are occasional skipped beats. The abdomen was soft nontender. She has bilateral varicosities of the lower extremities but more significant on the left compared to the right   Filed Vitals:   12/02/13 2117  BP: 160/80  Pulse: 76   Temp: 97.8 F (36.6 C)  Resp: 20   Results for orders placed in visit on 12/02/13  POCT URINALYSIS DIPSTICK      Result Value Ref Range   Color, UA yellow     Clarity, UA hazy     Glucose, UA neg     Bilirubin, UA neg     Ketones, UA neg     Spec Grav, UA 1.015     Blood, UA neg     pH, UA 7.0     Protein, UA neg     Urobilinogen, UA 0.2     Nitrite, UA neg     Leukocytes, UA moderate (2+)    POCT UA - MICROSCOPIC ONLY      Result Value Ref Range   WBC, Ur, HPF, POC 8-12     RBC, urine, microscopic 1-3     Bacteria, U Microscopic trace     Mucus, UA neg     Epithelial cells, urine per micros 2-3     Crystals, Ur, HPF, POC neg     Casts, Ur, LPF, POC neg     Yeast, UA neg    POCT CBC      Result Value Ref Range   WBC 6.2  4.6 - 10.2 K/uL   Lymph, poc 2.6  0.6 - 3.4   POC LYMPH PERCENT 41.2  10 - 50 %L   MID (cbc) 0.4  0 - 0.9   POC MID % 6.6  0 - 12 %M   POC Granulocyte 3.2  2 - 6.9   Granulocyte percent 52.2  37 - 80 %G   RBC 4.66  4.04 - 5.48 M/uL   Hemoglobin 12.5  12.2 - 16.2 g/dL   HCT, POC 96.0  45.4 - 47.9 %   MCV 87.7  80 - 97 fL   MCH, POC 26.8 (*) 27 - 31.2 pg   MCHC 30.6 (*) 31.8 - 35.4 g/dL   RDW, POC 09.8     Platelet Count, POC 313  142 - 424 K/uL   MPV 7.0  0 - 99.8 fL  EKG THERE are nonspecific T-wave changes but these are unchanged from her last EKG.  Assessment patient with multiple symptoms. She does have severe neck throat and jaw pain. Her EKG is unchanged.   COORDINATION OF CARE:  9:38 PM      Plan EKG looks unchanged to me . We'll send her the hospital because of this unexplained neck throat and jaw discomfort. Certainly feel components would be indicated. I did send in a prescription for cephalexin to take for urinary tract infection and may need urological referral. Also have her schedule for Doppler of the left leg.[name]  }

## 2013-12-02 NOTE — ED Notes (Signed)
Pt transported to xray 

## 2013-12-02 NOTE — Addendum Note (Signed)
Addended by: Ihor DowBYRD, Lulani Bour M on: 12/02/2013 10:36 PM   Modules accepted: Level of Service

## 2013-12-02 NOTE — ED Notes (Signed)
Neck pain and throat pain. Pt placed on cardiac monitor. EKG normal compared to old one per EMS. Sent from urgent care for cardiac eval for neck pain. Chest pain free. NO SOB, N, diaphoresis.

## 2013-12-03 LAB — CBC WITH DIFFERENTIAL/PLATELET
Basophils Absolute: 0 10*3/uL (ref 0.0–0.1)
Basophils Relative: 1 % (ref 0–1)
Eosinophils Absolute: 0.1 10*3/uL (ref 0.0–0.7)
Eosinophils Relative: 2 % (ref 0–5)
HEMATOCRIT: 38.7 % (ref 36.0–46.0)
HEMOGLOBIN: 12.6 g/dL (ref 12.0–15.0)
LYMPHS PCT: 42 % (ref 12–46)
Lymphs Abs: 2.7 10*3/uL (ref 0.7–4.0)
MCH: 28 pg (ref 26.0–34.0)
MCHC: 32.6 g/dL (ref 30.0–36.0)
MCV: 86 fL (ref 78.0–100.0)
MONO ABS: 0.4 10*3/uL (ref 0.1–1.0)
MONOS PCT: 6 % (ref 3–12)
Neutro Abs: 3.2 10*3/uL (ref 1.7–7.7)
Neutrophils Relative %: 49 % (ref 43–77)
Platelets: 223 10*3/uL (ref 150–400)
RBC: 4.5 MIL/uL (ref 3.87–5.11)
RDW: 15.1 % (ref 11.5–15.5)
WBC: 6.4 10*3/uL (ref 4.0–10.5)

## 2013-12-03 LAB — COMPREHENSIVE METABOLIC PANEL
ALBUMIN: 3.7 g/dL (ref 3.5–5.2)
ALT: 14 U/L (ref 0–35)
ALT: 14 U/L (ref 0–35)
AST: 14 U/L (ref 0–37)
AST: 16 U/L (ref 0–37)
Albumin: 4.2 g/dL (ref 3.5–5.2)
Alkaline Phosphatase: 82 U/L (ref 39–117)
Alkaline Phosphatase: 85 U/L (ref 39–117)
BUN: 14 mg/dL (ref 6–23)
BUN: 15 mg/dL (ref 6–23)
CO2: 27 mEq/L (ref 19–32)
CO2: 28 meq/L (ref 19–32)
Calcium: 9 mg/dL (ref 8.4–10.5)
Calcium: 9.5 mg/dL (ref 8.4–10.5)
Chloride: 102 mEq/L (ref 96–112)
Chloride: 103 mEq/L (ref 96–112)
Creat: 0.95 mg/dL (ref 0.50–1.10)
Creatinine, Ser: 0.87 mg/dL (ref 0.50–1.10)
GFR calc Af Amer: 74 mL/min — ABNORMAL LOW (ref >90)
GFR calc non Af Amer: 64 mL/min — ABNORMAL LOW (ref >90)
GLUCOSE: 100 mg/dL — AB (ref 70–99)
GLUCOSE: 101 mg/dL — AB (ref 70–99)
Potassium: 3.8 mEq/L (ref 3.5–5.3)
Potassium: 3.8 mEq/L (ref 3.7–5.3)
SODIUM: 140 meq/L (ref 137–147)
Sodium: 140 mEq/L (ref 135–145)
TOTAL PROTEIN: 7.2 g/dL (ref 6.0–8.3)
Total Bilirubin: 0.2 mg/dL — ABNORMAL LOW (ref 0.3–1.2)
Total Bilirubin: 0.3 mg/dL (ref 0.2–1.2)
Total Protein: 6.9 g/dL (ref 6.0–8.3)

## 2013-12-03 LAB — SEDIMENTATION RATE: Sed Rate: 12 mm/hr (ref 0–22)

## 2013-12-03 LAB — CK: CK TOTAL: 142 U/L (ref 7–177)

## 2013-12-03 LAB — I-STAT TROPONIN, ED: TROPONIN I, POC: 0 ng/mL (ref 0.00–0.08)

## 2013-12-03 MED ORDER — MORPHINE SULFATE 4 MG/ML IJ SOLN
4.0000 mg | Freq: Once | INTRAMUSCULAR | Status: AC
  Administered 2013-12-03: 4 mg via INTRAVENOUS
  Filled 2013-12-03: qty 1

## 2013-12-03 MED ORDER — METHOCARBAMOL 500 MG PO TABS: 500.0000 mg | ORAL_TABLET | Freq: Two times a day (BID) | ORAL | Status: DC

## 2013-12-03 MED ORDER — METHOCARBAMOL 500 MG PO TABS
500.0000 mg | ORAL_TABLET | Freq: Three times a day (TID) | ORAL | Status: DC
  Administered 2013-12-03: 500 mg via ORAL
  Filled 2013-12-03: qty 1

## 2013-12-03 NOTE — ED Provider Notes (Signed)
CSN: [id number]     Arrival date & time 12/02/13  2252 History   First MD Initiated Contact with Patient 12/03/13 0011     Chief Complaint  Patient presents with  . Neck Pain     (Consider location/radiation/quality/duration/timing/severity/associated sxs/prior Treatment) HPI Comments: 78 yo AA female with chronic UTI since October.  Pt reports now having bilateral ear and neck pain.  Pain is at the base of the back of head and went back to neck.  Onset 3-4 days ago.  She has used ear drops w/o relief.  She also started taking sinus medications.   She was seen at [facility] today and was sent to ER for evaluation for ACS.  All - codeine PMH - CAD, HTN, anxiety, fibromyalgia, dysrhythmia, vertigo, vent hypertrophy, cholesterol, angina, sob, GERD, h/o ulcers   Patient is a 78 y.o. female presenting with neck pain. The history is provided by the patient and the spouse.  Neck Pain Pain location:  Generalized neck Quality:  Aching Pain radiates to:  Does not radiate Pain severity:  Moderate Pain is:  Same all the time Onset quality:  Gradual Duration:  5 days Timing:  Constant Progression:  Unchanged Chronicity:  New Context: not fall, not jumping from heights, not MCA, not MVA and not recent injury   Relieved by: 1/2 percocet tab at home. Worsened by:  Bending Ineffective treatments:  None tried Associated symptoms: no bladder incontinence, no bowel incontinence, no chest pain, no fever, no headaches, no leg pain, no numbness, no paresis, no photophobia, no syncope, no tingling, no visual change and no weakness   Risk factors: no hx of head and neck radiation, no hx of osteoporosis, no hx of spinal trauma, no recent epidural, no recent head injury and no recurrent falls     Past Medical History  Diagnosis Date  . Coronary artery disease   . Hypertension   . Anxiety   . Fibromyalgia   . Dysrhythmia     history of "irregular heart rate"'; pt. thinks it was A-fib  . Vertigo 08/27/2006   [name] 08/27/2006 (01/27/2013)  . Ventricular hypertrophy     [name] 08/27/2006 (01/27/2013)  . High cholesterol   . Anginal pain   . Exertional shortness of breath     "sometimes" (01/27/2013)  . History of stomach ulcers     "years ago" (528/2014)  . GERD (gastroesophageal reflux disease)   . Arthritis     "q where" (01/27/2013)  . Osteoporosis   . Acute myocardial infarction, unspecified site, initial episode of care   . Acute myocardial infarction of other lateral wall, initial episode of care    Past Surgical History  Procedure Laterality Date  . Hernia repair    . Laparotomy      "day after oophorectomy; had bowel obstruction" (01/27/2013)  . Coronary angioplasty with stent placement  06/2005; 01/27/2013    stent to LAD/notes 08/27/2006; "+ 1 stent today" (01/27/2013)  . Cardiac catheterization  10/2006    [name] 08/03/2007 (01/27/2013)  . Tonsillectomy and adenoidectomy  ~ 1952  . Abdominal hysterectomy  1970's  . Laparoscopic salpingo oopherectomy  2000  . Diagnostic laparoscopy  2000    lap abdominal lysis of adhesions  . Breast lumpectomy Right 1970's    benign   . Breast biopsy Right 1970's   History reviewed. No pertinent family history. History  Substance Use Topics  . Smoking status: Former Smoker -- 0.12 packs/day for 36 years    Types: Cigarettes  Quit date: 12/02/1991  . Smokeless tobacco: Never Used  . Alcohol Use: No   OB History   Grav Para Term Preterm Abortions TAB SAB Ect Mult Living                 Review of Systems  Constitutional: Negative for fever.  Eyes: Negative for photophobia.  Cardiovascular: Negative for chest pain and syncope.  Gastrointestinal: Negative for bowel incontinence.  Genitourinary: Negative for bladder incontinence.  Musculoskeletal: Positive for neck pain.  Neurological: Negative for tingling, weakness, numbness and headaches.      Allergies  Codeine  Home Medications   Current Outpatient Rx  Name  Route  Sig   Dispense  Refill  . albuterol (PROVENTIL HFA;VENTOLIN HFA) 108 (90 BASE) MCG/ACT inhaler   Inhalation   Inhale 2 puffs into the lungs every 6 (six) hours as needed for wheezing.         [name] ALPRAZolam (XANAX) 0.25 MG tablet   Oral   Take 0.25 mg by mouth 3 (three) times daily as needed. anxiety         . aspirin EC 81 MG tablet   Oral   Take 81 mg by mouth daily.           . clopidogrel (PLAVIX) 75 MG tablet   Oral   Take 75 mg by mouth daily.         . fluticasone (FLONASE) 50 MCG/ACT nasal spray   Nasal   Place 2 sprays into the nose daily as needed for rhinitis.         . furosemide (LASIX) 20 MG tablet   Oral   Take 10 mg by mouth daily.          [name] gabapentin (NEURONTIN) 300 MG capsule   Oral   Take 300 mg by mouth 2 (two) times daily.           [name] loratadine (CLARITIN) 10 MG tablet   Oral   Take 10 mg by mouth daily as needed. allergies          . nitroGLYCERIN (NITROSTAT) 0.4 MG SL tablet   Sublingual   Place 1 tablet (0.4 mg total) under the tongue every 5 (five) minutes as needed for chest pain (CP or SOB).   25 tablet   12   . Pitavastatin Calcium (LIVALO) 4 MG TABS   Oral   Take 1 tablet by mouth daily.         . potassium chloride SA (K-DUR,KLOR-CON) 20 MEQ tablet   Oral   Take 20 mEq by mouth daily.         . Probiotic Product (ALIGN) 4 MG CAPS   Oral   Take 1 capsule by mouth daily.         . valsartan-hydrochlorothiazide (DIOVAN-HCT) 160-12.5 MG per tablet   Oral   Take 1 tablet by mouth daily.          [name] zolpidem (AMBIEN) 10 MG tablet   Oral   Take 5 mg by mouth daily as needed for sleep.          . cephALEXin (KEFLEX) 500 MG capsule   Oral   Take 500 mg by mouth 2 (two) times daily. Patient unsure when she completed course of medication          BP 150/79  Pulse 63  Temp(Src) 98 F (36.7 C) (Oral)  Resp 15  SpO2 99% Physical Exam  Nursing note and vitals reviewed. Constitutional:  She is oriented to  person, place, and time. She appears well-developed and well-nourished.  HENT:  Head: Normocephalic and atraumatic.  Right Ear: External ear normal.  Left Ear: External ear normal.  Nose: Nose normal.  Mouth/Throat: Oropharynx is clear and moist. No oropharyngeal exudate.  Eyes: Conjunctivae are normal. Pupils are equal, round, and reactive to light. Right eye exhibits no discharge. Left eye exhibits discharge.  Neck: Normal range of motion and full passive range of motion without pain. Neck supple. No JVD present. Muscular tenderness present. No spinous process tenderness present. No rigidity. No tracheal deviation, no edema, no erythema and normal range of motion present. No Brudzinski's sign and no Kernig's sign noted. No thyromegaly present.    Bilateral occipital PVM ttp and spasm; no midline ttp, no point ttp, no ttp to mastoids, no LAD,   FAROM noted,   Cardiovascular: Normal rate and regular rhythm.  Exam reveals no friction rub.   No murmur heard. Pulmonary/Chest: Effort normal and breath sounds normal. No stridor. No respiratory distress. She has no wheezes. She has no rales. She exhibits no tenderness.  Abdominal: Soft. Bowel sounds are normal. There is no tenderness. There is no rebound and no guarding.  Musculoskeletal: She exhibits tenderness. She exhibits no edema.  Lymphadenopathy:    She has no cervical adenopathy.  Neurological: She is alert and oriented to person, place, and time. She has normal reflexes. She displays normal reflexes. No cranial nerve deficit. She exhibits normal muscle tone. Coordination normal.  Normal speech, normal stance, normal gait  Skin: Skin is warm and dry.    ED Course  Procedures (including critical care time) Labs Review Labs Reviewed  COMPREHENSIVE METABOLIC PANEL - Abnormal; Notable for the following:    Glucose, Bld 101 (*)    Total Bilirubin 0.2 (*)    GFR calc non Af Amer 64 (*)    GFR calc Af Amer 74 (*)    All other components  within normal limits  CBC WITH DIFFERENTIAL  I-STAT TROPOININ, ED   Imaging Review Dg Chest 2 View  12/02/2013   CLINICAL DATA:  Body aches; neck, face and sinus pressure.  EXAM: CHEST  2 VIEW  COMPARISON:  Chest radiograph performed 08/22/2013  FINDINGS: The lungs are well-aerated. Mild left basilar opacity likely reflects atelectasis. There is no evidence of pleural effusion or pneumothorax.  The heart is borderline normal in size; the mediastinal contour is within normal limits. No acute osseous abnormalities are seen.  IMPRESSION: Mild left basilar airspace opacity likely reflects atelectasis; lungs otherwise clear.   Electronically Signed   By: Roanna RaiderJeffery  Chang M.D.   On: 12/02/2013 23:39     EKG Interpretation   Date/Time:  Thursday December 02 2013 23:57:23 EDT Ventricular Rate:  64 PR Interval:  142 QRS Duration: 83 QT Interval:  424 QTC Calculation: 437 R Axis:   65 Text Interpretation:  Sinus rhythm Atrial premature complex Consider left  atrial enlargement Nonspecific T abnrm, anterolateral leads Confirmed by  [facility]ASNERI  MD, Braden Cimo ([id number]) on 12/03/2013 2:32:41 AM        MDM   Final diagnoses:  Neck pain   Golden HurterCarolyn [name] Trostle is a 78 yo AA female with MMP who presents to ER with neck pain x 5 days.  PE c/w occipital PVM strain.  AFVSS.  Benign exam.  No neurologic symptoms.  Normal neurologic exam.  ER w/u: EKG, CXR, trop negative.  Single set appropriate as symptoms have been constant for 5 days.  Doubt ACS, stroke, TIA, vertebral or carotid dissection, meningitis, SBI, or other emergent etiology.  Lengthy d/w pt and husband who agree with plan for outpatient management with muscle relaxer (short course) and current pain rx.  They are aware of possible serious etiologies and strict return precautions were given.  They are reliable and will follow up with PCP within a few days.    If symptoms persist, worsen, or if new neurologic symptoms, HA or other concerns occur they should  return to ER and then imaging CT and CTA should be considered.  No clear indication for imaging of neck at this time.      Darlys Gales, MD 12/03/13 [phone]

## 2013-12-03 NOTE — Discharge Instructions (Signed)
Musculoskeletal Pain °Musculoskeletal pain is muscle and boney aches and pains. These pains can occur in any part of the body. Your caregiver may treat you without knowing the cause of the pain. They may treat you if blood or urine tests, X-rays, and other tests were normal.  °CAUSES °There is often not a definite cause or reason for these pains. These pains may be caused by a type of germ (virus). The discomfort may also come from overuse. Overuse includes working out too hard when your body is not fit. Boney aches also come from weather changes. Bone is sensitive to atmospheric pressure changes. °HOME CARE INSTRUCTIONS  °· Ask when your test results will be ready. Make sure you get your test results. °· Only take over-the-counter or prescription medicines for pain, discomfort, or fever as directed by your caregiver. If you were given medications for your condition, do not drive, operate machinery or power tools, or sign legal documents for 24 hours. Do not drink alcohol. Do not take sleeping pills or other medications that may interfere with treatment. °· Continue all activities unless the activities cause more pain. When the pain lessens, slowly resume normal activities. Gradually increase the intensity and duration of the activities or exercise. °· During periods of severe pain, bed rest may be helpful. Lay or sit in any position that is comfortable. °· Putting ice on the injured area. °· Put ice in a bag. °· Place a towel between your skin and the bag. °· Leave the ice on for 15 to 20 minutes, 3 to 4 times a day. °· Follow up with your caregiver for continued problems and no reason can be found for the pain. If the pain becomes worse or does not go away, it may be necessary to repeat tests or do additional testing. Your caregiver may need to look further for a possible cause. °SEEK IMMEDIATE MEDICAL CARE IF: °· You have pain that is getting worse and is not relieved by medications. °· You develop chest pain  that is associated with shortness or breath, sweating, feeling sick to your stomach (nauseous), or throw up (vomit). °· Your pain becomes localized to the abdomen. °· You develop any new symptoms that seem different or that concern you. °MAKE SURE YOU:  °· Understand these instructions. °· Will watch your condition. °· Will get help right away if you are not doing well or get worse. °Document Released: 08/19/2005 Document Revised: 11/11/2011 Document Reviewed: 04/23/2013 °[employer]® Patient Information ©2014 [employer]. ° °

## 2013-12-05 LAB — URINE CULTURE: Colony Count: 75000

## 2013-12-06 ENCOUNTER — Encounter: Payer: Self-pay | Admitting: Interventional Cardiology

## 2013-12-06 ENCOUNTER — Telehealth: Payer: Self-pay | Admitting: Interventional Cardiology

## 2013-12-06 NOTE — Telephone Encounter (Signed)
New message  Pt called states that she is experiencing SOB currently no other symptoms//Requests a call back from the nurse//SR

## 2013-12-06 NOTE — Telephone Encounter (Signed)
Pt called because she states during the weekend she C/O of neckt  pain; she went to the urgent care and they send pt to the ER, just in case the symptoms she had  was her heart. Labs were drown it came out that it was not her heart, and they found out the she had an UTI.   Two prescriptions were given in the ER to pt: An antibiotic Cephalexin 500 mg twice a day, and a muscle relaxing Robaxin 500 mg BID. Pt States that  last night she started having SOB and chest and Jaw pain just like when she had a heart attack last May. Pt would like to be seen today in the office. Pt was made aware that if she was having the same symptoms as before  that she needs to go to the ER. Pt states she is not going right now, but if the symptoms get worse she will go. she is aware that the PA/NP do not have any openings and with activa chest pain she needs to go to the ER. Pt would like to see if Dr. Katrinka BlazingSmith can see her tomorrow just in case if her symptoms improve; And she does not need to go to the ER.

## 2013-12-06 NOTE — Telephone Encounter (Signed)
Called pt , she states the pain is better; she still has some pressure in her chest and some pain on the back of her ear, but the symptoms are better. Pt is aware to call tomorrow if needed. Pt verbalized understanding.

## 2013-12-08 ENCOUNTER — Other Ambulatory Visit: Payer: Self-pay | Admitting: Radiology

## 2013-12-08 DIAGNOSIS — N390 Urinary tract infection, site not specified: Secondary | ICD-10-CM

## 2013-12-08 MED ORDER — CIPROFLOXACIN HCL 250 MG PO TABS
250.0000 mg | ORAL_TABLET | Freq: Two times a day (BID) | ORAL | Status: DC
Start: 2013-12-08 — End: 2014-01-25

## 2013-12-09 ENCOUNTER — Other Ambulatory Visit: Payer: Self-pay

## 2013-12-09 DIAGNOSIS — N390 Urinary tract infection, site not specified: Secondary | ICD-10-CM

## 2014-01-25 ENCOUNTER — Encounter: Payer: Self-pay | Admitting: Interventional Cardiology

## 2014-01-25 ENCOUNTER — Ambulatory Visit (INDEPENDENT_AMBULATORY_CARE_PROVIDER_SITE_OTHER): Payer: Medicare Other | Admitting: Interventional Cardiology

## 2014-01-25 VITALS — BP 142/92 | HR 80 | Ht 66.0 in | Wt 205.0 lb

## 2014-01-25 DIAGNOSIS — M352 Behcet's disease: Secondary | ICD-10-CM

## 2014-01-25 DIAGNOSIS — I2510 Atherosclerotic heart disease of native coronary artery without angina pectoris: Secondary | ICD-10-CM

## 2014-01-25 DIAGNOSIS — I10 Essential (primary) hypertension: Secondary | ICD-10-CM

## 2014-01-25 DIAGNOSIS — E785 Hyperlipidemia, unspecified: Secondary | ICD-10-CM

## 2014-01-25 NOTE — Progress Notes (Signed)
Patient ID: [name], female   DOB: 03-27-1936, 78 y.o.   MRN: [id number]    [address] [place], [place]  [address] Phone: [phone] Fax:  [phone]  Date:  01/25/2014   ID:  [name], DOB 03-27-1936, MRN [id number]  PCP:  Alva GarnetSHELTON,KIMBERLY R., MD   ASSESSMENT:  1. CAD with recent stenting, 01/27/2013 2. Hypertension 3. 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 continue Plavix and aspirin 2. No change in other cardiovascular care 3. Consider rheumatology evaluation   SUBJECTIVE: [name] is a 78 y.o. female who is having no cardiac complaints. She complains of continuous urethritis and has recently been diagnosed with iritis. She denies articular complaints. She denies orthopnea, PND, angina, and decrease in exertional tolerance. She continues her exercise program.   Wt Readings from Last 3 Encounters:  01/25/14 205 lb (92.987 kg)  12/02/13 203 lb 3.2 oz (92.171 kg)  10/02/13 194 lb 9.6 oz (88.27 kg)     Past Medical History  Diagnosis Date  . Coronary artery disease   . Hypertension   . Anxiety   . Fibromyalgia   . Dysrhythmia     history of "irregular heart rate"'; pt. thinks it was A-fib  . Vertigo 08/27/2006    [name]/notes 08/27/2006 (01/27/2013)  . Ventricular hypertrophy     [name]/notes 08/27/2006 (01/27/2013)  . High cholesterol   . Anginal pain   . Exertional shortness of breath     "sometimes" (01/27/2013)  . History of stomach ulcers     "years ago" (528/2014)  . GERD (gastroesophageal reflux disease)   . Arthritis     "q where" (01/27/2013)  . Osteoporosis   . Acute myocardial infarction, unspecified site, initial episode of care   . Acute myocardial infarction of other lateral wall, initial episode of care     Current Outpatient Prescriptions  Medication Sig Dispense Refill  . albuterol (PROVENTIL HFA;VENTOLIN HFA) 108 (90 BASE) MCG/ACT inhaler Inhale 2 puffs into the lungs every 6  (six) hours as needed for wheezing.      [name]. ALPRAZolam (XANAX) 0.25 MG tablet Take 0.25 mg by mouth 3 (three) times daily as needed. anxiety      . aspirin EC 81 MG tablet Take 81 mg by mouth daily.        . clopidogrel (PLAVIX) 75 MG tablet Take 75 mg by mouth daily.      . fluticasone (FLONASE) 50 MCG/ACT nasal spray Place 2 sprays into the nose daily as needed for rhinitis.      . furosemide (LASIX) 20 MG tablet Take 10 mg by mouth daily.       [name]. gabapentin (NEURONTIN) 300 MG capsule Take 300 mg by mouth 2 (two) times daily.        [name]. loratadine (CLARITIN) 10 MG tablet Take 10 mg by mouth daily as needed. allergies       . nitroGLYCERIN (NITROSTAT) 0.4 MG SL tablet Place 1 tablet (0.4 mg total) under the tongue every 5 (five) minutes as needed for chest pain (CP or SOB).  25 tablet  12  . phenazopyridine (PYRIDIUM) 100 MG tablet as directed.      . potassium chloride SA (K-DUR,KLOR-CON) 20 MEQ tablet Take 20 mEq by mouth daily.      . Probiotic Product (ALIGN) 4 MG CAPS Take 1 capsule by mouth daily.      . valsartan-hydrochlorothiazide (DIOVAN-HCT) 160-12.5 MG per tablet Take 1 tablet  by mouth daily.       [name] zolpidem (AMBIEN) 10 MG tablet Take 5 mg by mouth daily as needed for sleep.       . [DISCONTINUED] ezetimibe (ZETIA) 10 MG tablet Take 10 mg by mouth daily.        . [DISCONTINUED] metoprolol (TOPROL-XL) 50 MG 24 hr tablet Take 50 mg by mouth daily.        . [DISCONTINUED] potassium chloride (KLOR-CON) 20 MEQ packet Take 20 mEq by mouth daily.         Current Facility-Administered Medications  Medication Dose Route Frequency Provider Last Rate Last Dose  . cefTRIAXone (ROCEPHIN) injection 1 g  1 g Intramuscular Q24H Elvina Sidle, MD   1 g at 09/11/13 1548    Allergies:    Allergies  Allergen Reactions  . Codeine Itching    Social History:  The patient  reports that she quit smoking about 22 years ago. Her smoking use included Cigarettes. She has a 4.32 pack-year smoking  history. She has never used smokeless tobacco. She reports that she does not drink alcohol or use illicit drugs.   ROS:  Please see the history of present illness.   Appetite is stable. Recurrent antibiotic regimen with changes in therapy related to dysuria.   All other systems reviewed and negative.   OBJECTIVE: VS:  BP 142/92  Pulse 80  Ht 5\' 6"  (1.676 m)  Wt 205 lb (92.987 kg)  BMI 33.10 kg/m2 Well nourished, well developed, in no acute distress, obese HEENT: normal. Neck: JVD flat. Carotid bruit absent.  There is fullness in the left supraclavicular area that is soft and chills of the consistency of a lipoma.  Cardiac:  normal S1, S2; RRR; no murmur Lungs:  clear to auscultation bilaterally, no wheezing, rhonchi or rales Abd: soft, nontender, no hepatomegaly Ext: Edema absent. Pulses 2+ Skin: warm and dry Neuro:  CNs 2-12 intact, no focal abnormalities noted  EKG:  Not repeated       Signed, Darci Needle III, MD 01/25/2014 11:40 AM

## 2014-01-25 NOTE — Patient Instructions (Signed)
Your physician recommends that you continue on your current medications as directed. Please refer to the Current Medication list given to you today.  Your physician wants you to follow-up in: 1 year. You will receive a reminder letter in the mail two months in advance. If you don't receive a letter, please call our office to schedule the follow-up appointment.  

## 2014-01-26 ENCOUNTER — Encounter: Payer: Self-pay | Admitting: *Deleted

## 2014-01-26 DIAGNOSIS — R300 Dysuria: Secondary | ICD-10-CM | POA: Insufficient documentation

## 2014-01-26 DIAGNOSIS — N952 Postmenopausal atrophic vaginitis: Secondary | ICD-10-CM | POA: Insufficient documentation

## 2014-02-07 ENCOUNTER — Other Ambulatory Visit: Payer: Self-pay | Admitting: Interventional Cardiology

## 2014-03-16 ENCOUNTER — Ambulatory Visit (INDEPENDENT_AMBULATORY_CARE_PROVIDER_SITE_OTHER): Payer: Medicare Other | Admitting: General Surgery

## 2014-03-18 ENCOUNTER — Emergency Department (HOSPITAL_COMMUNITY)
Admission: EM | Admit: 2014-03-18 | Discharge: 2014-03-18 | Disposition: A | Discharge status: Home / Self Care | Payer: Medicare Other | Attending: Emergency Medicine | Admitting: Emergency Medicine

## 2014-03-18 ENCOUNTER — Emergency Department (HOSPITAL_COMMUNITY): Payer: Medicare Other

## 2014-03-18 ENCOUNTER — Encounter (HOSPITAL_COMMUNITY): Payer: Self-pay | Admitting: Emergency Medicine

## 2014-03-18 DIAGNOSIS — Z9861 Coronary angioplasty status: Secondary | ICD-10-CM | POA: Diagnosis not present

## 2014-03-18 DIAGNOSIS — Z862 Personal history of diseases of the blood and blood-forming organs and certain disorders involving the immune mechanism: Secondary | ICD-10-CM | POA: Diagnosis not present

## 2014-03-18 DIAGNOSIS — R200 Anesthesia of skin: Secondary | ICD-10-CM

## 2014-03-18 DIAGNOSIS — Z8719 Personal history of other diseases of the digestive system: Secondary | ICD-10-CM | POA: Insufficient documentation

## 2014-03-18 DIAGNOSIS — Z87891 Personal history of nicotine dependence: Secondary | ICD-10-CM | POA: Diagnosis not present

## 2014-03-18 DIAGNOSIS — I10 Essential (primary) hypertension: Secondary | ICD-10-CM | POA: Diagnosis present

## 2014-03-18 DIAGNOSIS — Z8639 Personal history of other endocrine, nutritional and metabolic disease: Secondary | ICD-10-CM | POA: Diagnosis not present

## 2014-03-18 DIAGNOSIS — G44209 Tension-type headache, unspecified, not intractable: Secondary | ICD-10-CM

## 2014-03-18 DIAGNOSIS — Z79899 Other long term (current) drug therapy: Secondary | ICD-10-CM | POA: Insufficient documentation

## 2014-03-18 DIAGNOSIS — F411 Generalized anxiety disorder: Secondary | ICD-10-CM | POA: Insufficient documentation

## 2014-03-18 DIAGNOSIS — I2510 Atherosclerotic heart disease of native coronary artery without angina pectoris: Secondary | ICD-10-CM | POA: Insufficient documentation

## 2014-03-18 DIAGNOSIS — Z7982 Long term (current) use of aspirin: Secondary | ICD-10-CM | POA: Insufficient documentation

## 2014-03-18 DIAGNOSIS — I252 Old myocardial infarction: Secondary | ICD-10-CM | POA: Diagnosis not present

## 2014-03-18 DIAGNOSIS — R51 Headache: Secondary | ICD-10-CM | POA: Diagnosis not present

## 2014-03-18 DIAGNOSIS — M129 Arthropathy, unspecified: Secondary | ICD-10-CM | POA: Diagnosis not present

## 2014-03-18 DIAGNOSIS — R209 Unspecified disturbances of skin sensation: Secondary | ICD-10-CM | POA: Insufficient documentation

## 2014-03-18 LAB — CBC WITH DIFFERENTIAL/PLATELET
BASOS ABS: 0 10*3/uL (ref 0.0–0.1)
BASOS PCT: 0 % (ref 0–1)
EOS PCT: 0 % (ref 0–5)
Eosinophils Absolute: 0 10*3/uL (ref 0.0–0.7)
HEMATOCRIT: 40.9 % (ref 36.0–46.0)
Hemoglobin: 13.3 g/dL (ref 12.0–15.0)
Lymphocytes Relative: 29 % (ref 12–46)
Lymphs Abs: 3.8 10*3/uL (ref 0.7–4.0)
MCH: 27.3 pg (ref 26.0–34.0)
MCHC: 32.5 g/dL (ref 30.0–36.0)
MCV: 84 fL (ref 78.0–100.0)
MONO ABS: 0.6 10*3/uL (ref 0.1–1.0)
Monocytes Relative: 5 % (ref 3–12)
NEUTROS ABS: 8.5 10*3/uL — AB (ref 1.7–7.7)
Neutrophils Relative %: 66 % (ref 43–77)
Platelets: 243 10*3/uL (ref 150–400)
RBC: 4.87 MIL/uL (ref 3.87–5.11)
RDW: 15.1 % (ref 11.5–15.5)
WBC: 12.9 10*3/uL — ABNORMAL HIGH (ref 4.0–10.5)

## 2014-03-18 LAB — BASIC METABOLIC PANEL
ANION GAP: 14 (ref 5–15)
BUN: 17 mg/dL (ref 6–23)
CALCIUM: 9.3 mg/dL (ref 8.4–10.5)
CO2: 26 mEq/L (ref 19–32)
CREATININE: 0.73 mg/dL (ref 0.50–1.10)
Chloride: 99 mEq/L (ref 96–112)
GFR calc non Af Amer: 82 mL/min — ABNORMAL LOW (ref >90)
Glucose, Bld: 117 mg/dL — ABNORMAL HIGH (ref 70–99)
Potassium: 3.8 mEq/L (ref 3.7–5.3)
Sodium: 139 mEq/L (ref 137–147)

## 2014-03-18 LAB — SEDIMENTATION RATE: Sed Rate: 10 mm/hr (ref 0–22)

## 2014-03-18 MED ORDER — DIPHENHYDRAMINE HCL 50 MG/ML IJ SOLN
25.0000 mg | Freq: Once | INTRAMUSCULAR | Status: AC
  Administered 2014-03-18: 25 mg via INTRAVENOUS
  Filled 2014-03-18: qty 1

## 2014-03-18 MED ORDER — SODIUM CHLORIDE 0.9 % IV SOLN
1000.0000 mL | INTRAVENOUS | Status: DC
  Administered 2014-03-18: 1000 mL via INTRAVENOUS

## 2014-03-18 MED ORDER — SODIUM CHLORIDE 0.9 % IV SOLN
1000.0000 mL | Freq: Once | INTRAVENOUS | Status: AC
  Administered 2014-03-18: 1000 mL via INTRAVENOUS

## 2014-03-18 MED ORDER — METOCLOPRAMIDE HCL 5 MG/ML IJ SOLN
10.0000 mg | Freq: Once | INTRAMUSCULAR | Status: AC
  Administered 2014-03-18: 10 mg via INTRAVENOUS
  Filled 2014-03-18: qty 2

## 2014-03-18 MED ORDER — METOCLOPRAMIDE HCL 10 MG PO TABS: 10.0000 mg | ORAL_TABLET | Freq: Four times a day (QID) | ORAL | Status: DC | PRN

## 2014-03-18 NOTE — ED Notes (Signed)
MD at bedside. 

## 2014-03-18 NOTE — ED Notes (Signed)
Pt reports having R sided numbness at temporal bone for about 2 months; reports she has seen PCP re: and has had work up; reports tonight she started to have pain in bil eyes and worsening numbness in R side of face starting at 6pm; states she had company over and thinks it flared up from talking with company and being more active tonight

## 2014-03-18 NOTE — ED Provider Notes (Addendum)
CSN: [id number]     Arrival date & time 03/18/14  0112 History   First MD Initiated Contact with Patient 03/18/14 0217     Chief Complaint  Patient presents with  . Hypertension     (Consider location/radiation/quality/duration/timing/severity/associated sxs/prior Treatment) Patient is a 78 y.o. female presenting with hypertension. The history is provided by the patient.  Hypertension  She is being treated for presumptive temporal arteritis with prednisone 40 mg a day. She's been on that for 2 weeks. She's been having intermittent facial numbness on the right side for the last 2 weeks and has also been treated by and of modest for possible iritis. She has noted some aching in her jaw when she talks when she chews and has had some aching in her shoulders. This evening, she noted worsening numbness in the right side of her face and it seemed to extend further down into her malar area. Vision is slightly blurred over baseline. She checked her blood pressure and it was elevated at 203/106. She was concerned so she came to the ED. She is complaining of a headache which is in the temporal areas and in the occipital area. There is no associated nausea or vomiting. She has not noted any extremity weakness or numbness.  Past Medical History  Diagnosis Date  . Coronary artery disease   . Hypertension   . Anxiety   . Fibromyalgia   . Dysrhythmia     history of "irregular heart rate"'; pt. thinks it was A-fib  . Vertigo 08/27/2006    [name] 08/27/2006 (01/27/2013)  . Ventricular hypertrophy     [name] 08/27/2006 (01/27/2013)  . High cholesterol   . Anginal pain   . Exertional shortness of breath     "sometimes" (01/27/2013)  . History of stomach ulcers     "years ago" (528/2014)  . GERD (gastroesophageal reflux disease)   . Arthritis     "q where" (01/27/2013)  . Osteoporosis   . Acute myocardial infarction, unspecified site, initial episode of care   . Acute myocardial infarction of other  lateral wall, initial episode of care    Past Surgical History  Procedure Laterality Date  . Hernia repair    . Laparotomy      "day after oophorectomy; had bowel obstruction" (01/27/2013)  . Coronary angioplasty with stent placement  06/2005; 01/27/2013    stent to LAD/notes 08/27/2006; "+ 1 stent today" (01/27/2013)  . Cardiac catheterization  10/2006    [name] 08/03/2007 (01/27/2013)  . Tonsillectomy and adenoidectomy  ~ 1952  . Abdominal hysterectomy  1970's  . Laparoscopic salpingo oopherectomy  2000  . Diagnostic laparoscopy  2000    lap abdominal lysis of adhesions  . Breast lumpectomy Right 1970's    benign   . Breast biopsy Right 1970's   Family History  Problem Relation Age of Onset  . Other Mother   . Other Father    History  Substance Use Topics  . Smoking status: Former Smoker -- 0.12 packs/day for 36 years    Types: Cigarettes    Quit date: 12/02/1991  . Smokeless tobacco: Never Used  . Alcohol Use: No   OB History   Grav Para Term Preterm Abortions TAB SAB Ect Mult Living                 Review of Systems  All other systems reviewed and are negative.     Allergies  Codeine  Home Medications   Prior to Admission medications  Medication Sig Start Date End Date Taking? Authorizing Provider  albuterol (PROVENTIL HFA;VENTOLIN HFA) 108 (90 BASE) MCG/ACT inhaler Inhale 2 puffs into the lungs every 6 (six) hours as needed for wheezing.   Yes Historical Provider, MD  ALPRAZolam (XANAX) 0.25 MG tablet Take 0.25 mg by mouth 3 (three) times daily as needed. anxiety   Yes Historical Provider, MD  aspirin EC 81 MG tablet Take 81 mg by mouth daily.     Yes Historical Provider, MD  clopidogrel (PLAVIX) 75 MG tablet take 1 tablet by mouth once daily   Yes Lyn Records III, MD  fluticasone [facility]) 50 MCG/ACT nasal spray Place 2 sprays into the nose daily as needed for rhinitis.   Yes Historical Provider, MD  furosemide (LASIX) 20 MG tablet Take 10 mg by mouth daily.     Yes Historical Provider, MD  gabapentin (NEURONTIN) 300 MG capsule Take 300 mg by mouth 2 (two) times daily.     Yes Historical Provider, MD  HYDROcodone-acetaminophen (NORCO/VICODIN) 5-325 MG per tablet Take 1 tablet by mouth every 6 (six) hours as needed for moderate pain.  12/24/13  Yes Historical Provider, MD  loratadine (CLARITIN) 10 MG tablet Take 10 mg by mouth daily as needed. allergies    Yes Historical Provider, MD  LOTEMAX 0.5 % GEL Place 1 drop into both eyes daily.  02/12/14  Yes Historical Provider, MD  nitroGLYCERIN (NITROSTAT) 0.4 MG SL tablet Place 1 tablet (0.4 mg total) under the tongue every 5 (five) minutes as needed for chest pain (CP or SOB). 01/28/13  Yes Lyn Records III, MD  potassium chloride SA (K-DUR,KLOR-CON) 20 MEQ tablet Take 20 mEq by mouth daily.   Yes Historical Provider, MD  predniSONE (DELTASONE) 10 MG tablet Take 20 mg by mouth 2 (two) times daily with a meal.   Yes Historical Provider, MD  Probiotic Product (ALIGN) 4 MG CAPS Take 1 capsule by mouth daily.   Yes Historical Provider, MD  valsartan-hydrochlorothiazide (DIOVAN-HCT) 160-12.5 MG per tablet Take 1 tablet by mouth daily.  07/05/13  Yes Historical Provider, MD  zolpidem (AMBIEN) 10 MG tablet Take 5 mg by mouth daily as needed for sleep.    Yes Historical Provider, MD   BP 191/81  Pulse 70  Temp(Src) 98.1 F (36.7 C) (Oral)  Resp 16  Ht 5\' 7"  (1.702 m)  Wt 202 lb (91.627 kg)  BMI 31.63 kg/m2  SpO2 100% Physical Exam  Nursing note and vitals reviewed.  78 year old female, resting comfortably and in no acute distress. Vital signs are significant for hypertension with blood pressure 191/81. Oxygen saturation is 100%, which is normal. Head is normocephalic and atraumatic. PERRLA, EOMI. Oropharynx is clear. Fundi show no hemorrhage, exudate, or papilledema. There is tenderness palpation over the temporal arteries and temporalis muscles bilaterally and also at the insertion of the paracervical muscles  bilaterally. There is no facial asymmetry and there is no objective decreased sensation in the face. Neck is nontender and supple without adenopathy or JVD. Back is nontender and there is no CVA tenderness. Lungs are clear without rales, wheezes, or rhonchi. Chest is nontender. Heart has regular rate and rhythm without murmur. Abdomen is soft, flat, nontender without masses or hepatosplenomegaly and peristalsis is normoactive. Extremities have no cyanosis or edema, full range of motion is present. Skin is warm and dry without rash. Neurologic: Mental status is normal, cranial nerves are intact, there are no motor or sensory deficits.  ED Course  Procedures (  including critical care time) Labs Review Results for orders placed during the hospital encounter of 03/18/14  CBC WITH DIFFERENTIAL      Result Value Ref Range   WBC 12.9 (*) 4.0 - 10.5 K/uL   RBC 4.87  3.87 - 5.11 MIL/uL   Hemoglobin 13.3  12.0 - 15.0 g/dL   HCT 16.1  09.6 - 04.5 %   MCV 84.0  78.0 - 100.0 fL   MCH 27.3  26.0 - 34.0 pg   MCHC 32.5  30.0 - 36.0 g/dL   RDW 40.9  81.1 - 91.4 %   Platelets 243  150 - 400 K/uL   Neutrophils Relative % 66  43 - 77 %   Neutro Abs 8.5 (*) 1.7 - 7.7 K/uL   Lymphocytes Relative 29  12 - 46 %   Lymphs Abs 3.8  0.7 - 4.0 K/uL   Monocytes Relative 5  3 - 12 %   Monocytes Absolute 0.6  0.1 - 1.0 K/uL   Eosinophils Relative 0  0 - 5 %   Eosinophils Absolute 0.0  0.0 - 0.7 K/uL   Basophils Relative 0  0 - 1 %   Basophils Absolute 0.0  0.0 - 0.1 K/uL  BASIC METABOLIC PANEL      Result Value Ref Range   Sodium 139  137 - 147 mEq/L   Potassium 3.8  3.7 - 5.3 mEq/L   Chloride 99  96 - 112 mEq/L   CO2 26  19 - 32 mEq/L   Glucose, Bld 117 (*) 70 - 99 mg/dL   BUN 17  6 - 23 mg/dL   Creatinine, Ser 7.82  0.50 - 1.10 mg/dL   Calcium 9.3  8.4 - 95.6 mg/dL   GFR calc non Af Amer 82 (*) >90 mL/min   GFR calc Af Amer >90  >90 mL/min   Anion gap 14  5 - 15  SEDIMENTATION RATE      Result  Value Ref Range   Sed Rate 10  0 - 22 mm/hr   Imaging Review Ct Head Wo Contrast  03/18/2014   CLINICAL DATA:  Hypertension with right-sided numbness for 2 months.  EXAM: CT HEAD WITHOUT CONTRAST  TECHNIQUE: Contiguous axial images were obtained from the base of the skull through the vertex without intravenous contrast.  COMPARISON:  07/07/2011  FINDINGS: Skull and Sinuses:Negative for fracture or destructive process. The mastoids, middle ears, and imaged paranasal sinuses are clear.  Orbits: Bilateral cataract resection.  Brain: No evidence of acute abnormality, such as acute infarction, hemorrhage, hydrocephalus, or mass lesion/mass effect. There is generalized cerebral volume loss which is age appropriate. Mild small vessel disease with ischemic gliosis best seen in the biparietal white matter.  IMPRESSION: No acute intracranial findings.   Electronically Signed   By: Tiburcio Pea M.D.   On: 03/18/2014 04:20     EKG Interpretation   Date/Time:  Friday March 18 2014 01:35:35 EDT Ventricular Rate:  71 PR Interval:  127 QRS Duration: 80 QT Interval:  375 QTC Calculation: 407 R Axis:   45 Text Interpretation:  Sinus rhythm Normal ECG When compared with ECG of  12/02/2013, No significant change was found Confirmed by [facility]  MD, Santasia Rew  ([id number]) on 03/18/2014 2:18:11 AM      MDM   Final diagnoses:  Muscle contraction headache  Right facial numbness    Headache which may be related to underlying temporal arteritis and may be partly muscle contraction headache. She'll be given  headache cocktail and IV fluids, IV metoclopramide, and rheumatic and sedimentation rate will be checked. She is on a relatively low starting dose of prednisone and if sedimentation rate is high, may need to consider increasing it. Hypertension which may be reactive. This will be reassessed after she is feeling better. No evidence of hypertensive crisis. Old records are reviewed and there are no relevant visits in the  EMR.  Following IV metoclopramide, IV diphenhydramine, and IV normal saline, headache was completely gone and numbness had completely resolved. The pressure had come down although not to normal. Sedimentation rate has come back normal, so I think it is reasonable to continue on the current dose of prednisone. Her headache tonight and apparently as a muscle contraction headache and the numbness was secondary to the headache and hypertension secondary to the headache. She is discharged with a prescription for metoclopramide to use as needed for nausea or headache and she is advised to continue with treatment for her presumed polymyalgia rheumatica and temporal arteritis, and to go for her scheduled temporal artery biopsy as scheduled.  Dione Boozeavid Nea Gittens, MD 03/18/14 [id number]  Dione Boozeavid Lissett Favorite, MD 03/18/14 [id number]

## 2014-03-18 NOTE — ED Notes (Signed)
CT notified that the pt is ready for her CT scan.  

## 2014-03-18 NOTE — Discharge Instructions (Signed)
Continue taking the prednisone as directed. Make sure to go for your temporal artery biopsy is scheduled.  Tension Headache A tension headache is a feeling of pain, pressure, or aching often felt over the front and sides of the head. The pain can be dull or can feel tight (constricting). It is the most common type of headache. Tension headaches are not normally associated with nausea or vomiting and do not get worse with physical activity. Tension headaches can last 30 minutes to several days.  CAUSES  The exact cause is not known, but it may be caused by chemicals and hormones in the brain that lead to pain. Tension headaches often begin after stress, anxiety, or depression. Other triggers may include:  Alcohol.  Caffeine (too much or withdrawal).  Respiratory infections (colds, flu, sinus infections).  Dental problems or teeth clenching.  Fatigue.  Holding your head and neck in one position too long while using a computer. SYMPTOMS   Pressure around the head.   Dull, aching head pain.   Pain felt over the front and sides of the head.   Tenderness in the muscles of the head, neck, and shoulders. DIAGNOSIS  A tension headache is often diagnosed based on:   Symptoms.   Physical examination.   A CT scan or MRI of your head. These tests may be ordered if symptoms are severe or unusual. TREATMENT  Medicines may be given to help relieve symptoms.  HOME CARE INSTRUCTIONS   Only take over-the-counter or prescription medicines for pain or discomfort as directed by your caregiver.   Lie down in a dark, quiet room when you have a headache.   Keep a journal to find out what may be triggering your headaches. For example, write down:  What you eat and drink.  How much sleep you get.  Any change to your diet or medicines.  Try massage or other relaxation techniques.   Ice packs or heat applied to the head and neck can be used. Use these 3 to 4 times per day for 15 to 20  minutes each time, or as needed.   Limit stress.   Sit up straight, and do not tense your muscles.   Quit smoking if you smoke.  Limit alcohol use.  Decrease the amount of caffeine you drink, or stop drinking caffeine.  Eat and exercise regularly.  Get 7 to 9 hours of sleep, or as recommended by your caregiver.  Avoid excessive use of pain medicine as recurrent headaches can occur.  SEEK MEDICAL CARE IF:   You have problems with the medicines you were prescribed.  Your medicines do not work.  You have a change from the usual headache.  You have nausea or vomiting. SEEK IMMEDIATE MEDICAL CARE IF:   Your headache becomes severe.  You have a fever.  You have a stiff neck.  You have loss of vision.  You have muscular weakness or loss of muscle control.  You lose your balance or have trouble walking.  You feel faint or pass out.  You have severe symptoms that are different from your first symptoms. MAKE SURE YOU:   Understand these instructions.  Will watch your condition.  Will get help right away if you are not doing well or get worse. Document Released: 08/19/2005 Document Revised: 11/11/2011 Document Reviewed: 08/09/2011 [facility] Patient Information 2015 [place], [place]. This information is not intended to replace advice given to you by your health care provider. Make sure you discuss any questions you have  with your health care provider.  Metoclopramide tablets What is this medicine? METOCLOPRAMIDE (met oh kloe PRA mide) is used to treat the symptoms of gastroesophageal reflux disease (GERD) like heartburn. It is also used to treat people with slow emptying of the stomach and intestinal tract. This medicine may be used for other purposes; ask your health care provider or pharmacist if you have questions. COMMON BRAND NAME(S): Reglan What should I tell my health care provider before I take this medicine? They need to know if you have any of these  conditions: -breast cancer -depression -diabetes -heart failure -high blood pressure -kidney disease -liver disease -Parkinson's disease or a movement disorder -pheochromocytoma -seizures -stomach obstruction, bleeding, or perforation -an unusual or allergic reaction to metoclopramide, procainamide, sulfites, other medicines, foods, dyes, or preservatives -pregnant or trying to get pregnant -breast-feeding How should I use this medicine? Take this medicine by mouth with a glass of water. Follow the directions on the prescription label. Take this medicine on an empty stomach, about 30 minutes before eating. Take your doses at regular intervals. Do not take your medicine more often than directed. Do not stop taking except on the advice of your doctor or health care professional. A special MedGuide will be given to you by the pharmacist with each prescription and refill. Be sure to read this information carefully each time. Talk to your pediatrician regarding the use of this medicine in children. Special care may be needed. Overdosage: If you think you have taken too much of this medicine contact a poison control center or emergency room at once. NOTE: This medicine is only for you. Do not share this medicine with others. What if I miss a dose? If you miss a dose, take it as soon as you can. If it is almost time for your next dose, take only that dose. Do not take double or extra doses. What may interact with this medicine? -acetaminophen -cyclosporine -digoxin -medicines for blood pressure -medicines for diabetes, including insulin -medicines for hay fever and other allergies -medicines for depression, especially an Monoamine Oxidase Inhibitor (MAOI) -medicines for Parkinson's disease, like levodopa -medicines for sleep or for pain -tetracycline This list may not describe all possible interactions. Give your health care provider a list of all the medicines, herbs, non-prescription  drugs, or dietary supplements you use. Also tell them if you smoke, drink alcohol, or use illegal drugs. Some items may interact with your medicine. What should I watch for while using this medicine? It may take a few weeks for your stomach condition to start to get better. However, do not take this medicine for longer than 12 weeks. The longer you take this medicine, and the more you take it, the greater your chances are of developing serious side effects. If you are an elderly patient, a female patient, or you have diabetes, you may be at an increased risk for side effects from this medicine. Contact your doctor immediately if you start having movements you cannot control such as lip smacking, rapid movements of the tongue, involuntary or uncontrollable movements of the eyes, head, arms and legs, or muscle twitches and spasms. Patients and their families should watch out for worsening depression or thoughts of suicide. Also watch out for any sudden or severe changes in feelings such as feeling anxious, agitated, panicky, irritable, hostile, aggressive, impulsive, severely restless, overly excited and hyperactive, or not being able to sleep. If this happens, especially at the beginning of treatment or after a change in dose,  call your doctor. Do not treat yourself for high fever. Ask your doctor or health care professional for advice. You may get drowsy or dizzy. Do not drive, use machinery, or do anything that needs mental alertness until you know how this drug affects you. Do not stand or sit up quickly, especially if you are an older patient. This reduces the risk of dizzy or fainting spells. Alcohol can make you more drowsy and dizzy. Avoid alcoholic drinks. What side effects may I notice from receiving this medicine? Side effects that you should report to your doctor or health care professional as soon as possible: -allergic reactions like skin rash, itching or hives, swelling of the face, lips, or  tongue -abnormal production of milk in females -breast enlargement in both males and females -change in the way you walk -difficulty moving, speaking or swallowing -drooling, lip smacking, or rapid movements of the tongue -excessive sweating -fever -involuntary or uncontrollable movements of the eyes, head, arms and legs -irregular heartbeat or palpitations -muscle twitches and spasms -unusually weak or tired Side effects that usually do not require medical attention (report to your doctor or health care professional if they continue or are bothersome): -change in sex drive or performance -depressed mood -diarrhea -difficulty sleeping -headache -menstrual changes -restless or nervous This list may not describe all possible side effects. Call your doctor for medical advice about side effects. You may report side effects to FDA at 1-800-FDA-1088. Where should I keep my medicine? Keep out of the reach of children. Store at room temperature between 20 and 25 degrees C (68 and 77 degrees F). Protect from light. Keep container tightly closed. Throw away any unused medicine after the expiration date. NOTE: This sheet is a summary. It may not cover all possible information. If you have questions about this medicine, talk to your doctor, pharmacist, or health care provider.  2015, [employer]vier/[employer] Standard. (2011-12-17 13:04:38)

## 2014-03-18 NOTE — ED Notes (Addendum)
Dr Preston Fleetingglick made aware of pt's complaints and time of numbness worsening

## 2014-03-18 NOTE — ED Notes (Signed)
Patient transported to CT 

## 2014-03-21 ENCOUNTER — Ambulatory Visit (INDEPENDENT_AMBULATORY_CARE_PROVIDER_SITE_OTHER): Payer: BC Managed Care – PPO | Admitting: General Surgery

## 2014-03-21 ENCOUNTER — Other Ambulatory Visit (HOSPITAL_COMMUNITY): Payer: Self-pay | Admitting: Rheumatology

## 2014-03-21 DIAGNOSIS — R519 Headache, unspecified: Secondary | ICD-10-CM

## 2014-03-21 DIAGNOSIS — R51 Headache: Principal | ICD-10-CM

## 2014-03-22 ENCOUNTER — Other Ambulatory Visit (HOSPITAL_COMMUNITY): Payer: Self-pay | Admitting: Rheumatology

## 2014-03-22 DIAGNOSIS — R51 Headache: Principal | ICD-10-CM

## 2014-03-22 DIAGNOSIS — R519 Headache, unspecified: Secondary | ICD-10-CM

## 2014-03-23 ENCOUNTER — Other Ambulatory Visit (HOSPITAL_COMMUNITY): Payer: Self-pay | Admitting: Rheumatology

## 2014-03-23 ENCOUNTER — Ambulatory Visit (HOSPITAL_COMMUNITY)
Admission: RE | Admit: 2014-03-23 | Discharge: 2014-03-23 | Disposition: A | Discharge status: Home / Self Care | Payer: Medicare Other | Source: Ambulatory Visit | Attending: Rheumatology | Admitting: Rheumatology

## 2014-03-23 ENCOUNTER — Telehealth: Payer: Self-pay | Admitting: *Deleted

## 2014-03-23 DIAGNOSIS — R519 Headache, unspecified: Secondary | ICD-10-CM

## 2014-03-23 DIAGNOSIS — R51 Headache: Secondary | ICD-10-CM

## 2014-03-23 DIAGNOSIS — M316 Other giant cell arteritis: Secondary | ICD-10-CM

## 2014-03-23 NOTE — Progress Notes (Signed)
VASCULAR LAB PRELIMINARY  PRELIMINARY  PRELIMINARY  PRELIMINARY  Temporal artery duplex completed.    Preliminary report:  Bilateral:   No evidence of the "halo" sign. Velocities and waveforms within normal limits.  Perle Gibbon, RVT 03/23/2014, 3:42 PM

## 2014-03-23 NOTE — Telephone Encounter (Signed)
Cindy from the [facility], calling to clarify the order for pt that was (recommended by Dr. Pearlean BrownieSethi for pt) for Dr. Corliss Skainseveshwar relating to temporal arteritis.  TCD or limited carotid US.  Consulted Dr. Pearlean BrownieSethi.  Yes, limited carotid US for temporal arteritis.  LMVM for Dr. Corliss Skainseveshwar [phone].   I then called [phone] spoke to [facility], with Dr. Fatima Sangereveshwar's office and relayed this information.   She will fax order to [place] at [phone].

## 2014-03-24 ENCOUNTER — Encounter: Payer: Self-pay | Admitting: Neurology

## 2014-03-24 ENCOUNTER — Ambulatory Visit (INDEPENDENT_AMBULATORY_CARE_PROVIDER_SITE_OTHER): Payer: Medicare Other | Admitting: Neurology

## 2014-03-24 VITALS — BP 152/81 | HR 81 | Ht 65.5 in | Wt 205.4 lb

## 2014-03-24 DIAGNOSIS — R209 Unspecified disturbances of skin sensation: Secondary | ICD-10-CM

## 2014-03-24 DIAGNOSIS — R200 Anesthesia of skin: Secondary | ICD-10-CM | POA: Insufficient documentation

## 2014-03-24 DIAGNOSIS — G501 Atypical facial pain: Secondary | ICD-10-CM

## 2014-03-24 LAB — SEDIMENTATION RATE: Sed Rate: 5 mm/hr (ref 0–40)

## 2014-03-24 MED ORDER — GABAPENTIN 300 MG PO CAPS: 300.0000 mg | ORAL_CAPSULE | Freq: Three times a day (TID) | ORAL | Status: DC

## 2014-03-24 NOTE — Patient Instructions (Signed)
I had a long discussion with the patient and her husband regarding her facial and ear pain and numbness, discussed my differential diagnosis, plan for evaluation, treatment and reviewed  the available films and answered questions. I recommend she increase the gabapentin to 300 mg 3 times daily for week and further as tolerated and needed he did have this is not effective may consider switching to Topamax in the future. Check CT scan of the maxillary region to rule out any bony or structural pathology. Check MRI scan of the brain and cervical spine. Check ESR. Her clinical presentation is quite typical for temporal arteritis and hence will hold off on temporal artery biopsy or temporal artery ultrasound at the present time. Return for followup in 6 weeks or earlier if necessary

## 2014-03-24 NOTE — Progress Notes (Signed)
[facility] [address] [place]. [place] [address] [phone]       OFFICE CONSULT NOTE  [name] Date of Birth:  11/24/1935 Medical Record Number:  [id number]   Referring MD:  Bo Merino  Reason for Referral:  Right face pain and numbness  HPI: 45 years African American lady was had with history of pain the right side of her face and numbness since the last 2-1/2 months. She initially describes severe pain in the right temples as well as in the ear and rightd face spreading to the back of the neck and cheek area. There is also accompanying numbness. She was started on prednisone 20 mg twice daily which within a few days he of the pain but she still has persistent numbness over the right cheek and middle face. This also involves the inside of the mouth cheeks. She was seen in the ER and was given IV Benadryl with   Some relief of the congestion in her sinuses. She denies any loss of vision, blurred vision, jaw claudication, scalp tenderness. She has history of fibromyalgia. She has been  On gabapentin 300 mg  twice daily in the past for fibromyalgia symptoms which  seem to be under control. She has never tried a higher dose yet tried Lyrica in the past but discontinued it because of leg swelling. Patient is a chronically and in the past but currently denies   pain. She's had some neck aches but never had an MRI scan . She has not had any facial x-rays and  CT scan done to look for bony pathology. She does not have history of chronic headaches, migraines, stroke, TIAs or significant neurological problems  ROS:   14 system review of systems is positive for right temporal pain, he had pain, head pain, right face numbness, headache, insomnia and over the systems negative  PMH:  Past Medical History  Diagnosis Date  . Coronary artery disease   . Hypertension   . Anxiety   . Fibromyalgia   . Dysrhythmia     history of "irregular heart rate"'; pt.  thinks it was A-fib  . Vertigo 08/27/2006    Archie Endo 08/27/2006 (01/27/2013)  . Ventricular hypertrophy     Archie Endo 08/27/2006 (01/27/2013)  . High cholesterol   . Anginal pain   . Exertional shortness of breath     "sometimes" (01/27/2013)  . History of stomach ulcers     "years ago" (528/2014)  . GERD (gastroesophageal reflux disease)   . Arthritis     "q where" (01/27/2013)  . Osteoporosis   . Acute myocardial infarction, unspecified site, initial episode of care   . Acute myocardial infarction of other lateral wall, initial episode of care     Social History:  History   Social History  . Marital Status: Married    Spouse Name: N/A    Number of Children: 3  . Years of Education: masters   Occupational History  . retired    Social History Main Topics  . Smoking status: Former Smoker -- 0.12 packs/day for 36 years    Types: Cigarettes    Quit date: 12/02/1991  . Smokeless tobacco: Never Used  . Alcohol Use: No  . Drug Use: No  . Sexual Activity: Not Currently    Birth Control/ Protection: Surgical   Other Topics Concern  . Not on file   Social History Narrative  . No narrative on file    Medications:   Current Outpatient  Prescriptions on File Prior to Visit  Medication Sig Dispense Refill  . albuterol (PROVENTIL HFA;VENTOLIN HFA) 108 (90 BASE) MCG/ACT inhaler Inhale 2 puffs into the lungs every 6 (six) hours as needed for wheezing.      [name] ALPRAZolam (XANAX) 0.25 MG tablet Take 0.25 mg by mouth 3 (three) times daily as needed. anxiety      . aspirin EC 81 MG tablet Take 81 mg by mouth daily.        . clopidogrel (PLAVIX) 75 MG tablet take 1 tablet by mouth once daily  30 tablet  11  . fluticasone (FLONASE) 50 MCG/ACT nasal spray Place 2 sprays into the nose daily as needed for rhinitis.      . furosemide (LASIX) 20 MG tablet Take 10 mg by mouth daily.       [name] HYDROcodone-acetaminophen (NORCO/VICODIN) 5-325 MG per tablet Take 1 tablet by mouth every 6 (six) hours as  needed for moderate pain.       [name] loratadine (CLARITIN) 10 MG tablet Take 10 mg by mouth daily as needed. allergies       . LOTEMAX 0.5 % GEL Place 1 drop into both eyes daily.       . metoCLOPramide (REGLAN) 10 MG tablet Take 1 tablet (10 mg total) by mouth every 6 (six) hours as needed for nausea (or headache).  30 tablet  0  . nitroGLYCERIN (NITROSTAT) 0.4 MG SL tablet Place 1 tablet (0.4 mg total) under the tongue every 5 (five) minutes as needed for chest pain (CP or SOB).  25 tablet  12  . potassium chloride SA (K-DUR,KLOR-CON) 20 MEQ tablet Take 20 mEq by mouth daily.      . predniSONE (DELTASONE) 10 MG tablet Take 20 mg by mouth 2 (two) times daily with a meal.      . Probiotic Product (ALIGN) 4 MG CAPS Take 1 capsule by mouth daily.      . valsartan-hydrochlorothiazide (DIOVAN-HCT) 160-12.5 MG per tablet Take 1 tablet by mouth daily.       [name] zolpidem (AMBIEN) 10 MG tablet Take 5 mg by mouth daily as needed for sleep.       . [DISCONTINUED] ezetimibe (ZETIA) 10 MG tablet Take 10 mg by mouth daily.        . [DISCONTINUED] metoprolol (TOPROL-XL) 50 MG 24 hr tablet Take 50 mg by mouth daily.        . [DISCONTINUED] potassium chloride (KLOR-CON) 20 MEQ packet Take 20 mEq by mouth daily.         Current Facility-Administered Medications on File Prior to Visit  Medication Dose Route Frequency Provider Last Rate Last Dose  . cefTRIAXone (ROCEPHIN) injection 1 g  1 g Intramuscular Q24H Robyn Haber, MD   1 g at 09/11/13 1548    Allergies:   Allergies  Allergen Reactions  . Codeine Itching    Physical Exam General: well developed, well nourished, seated, in no evident distress Head: head normocephalic and atraumatic. Orohparynx benign Neck: supple with no carotid or supraclavicular bruits Cardiovascular: regular rate and rhythm, no murmurs Musculoskeletal: no deformity Skin:  no rash/petichiae Vascular:  Normal pulses all extremities Filed Vitals:   03/24/14 1400  BP: 152/81    Pulse: 81    Neurologic Exam Mental Status: Awake and fully alert. Oriented to place and time. Recent and remote memory intact. Attention span, concentration and fund of knowledge appropriate. Mood and affect appropriate.  Cranial Nerves: Fundoscopic exam reveals sharp disc margins. Pupils  equal, briskly reactive to light. Extraocular movements full without nystagmus. Visual fields full to confrontation. Hearing intact. Facial sensation intact. Face, tongue, palate moves normally and symmetrically.  Motor: Normal bulk and tone. Normal strength in all tested extremity muscles. Sensory.: intact to tough and pinprick and vibratory sensation except mild subjective diminished sensation the right maxillary region in V2 distribution.  Coordination: Rapid alternating movements normal in all extremities. Finger-to-nose and heel-to-shin performed accurately bilaterally. Gait and Station: Arises from chair without difficulty. Stance is normal. Gait demonstrates normal stride length and balance . Able to heel, toe and tandem walk without difficulty.  Reflexes: 1+ and symmetric. Toes downgoing.      ASSESSMENT: 32 year African American lady with right facial  Pain and numbness   of unclear etiology. Possibilities include neurologic pain from underlying bony pathology..A typical facial neuralgia is also a possibility. Temporal arteritis is unlikely with this clinical presentation.    PLAN:   I had a long discussion with the patient and her husband regarding her facial and ear pain and numbness, discussed my differential diagnosis, plan for evaluation, treatment and reviewed  the available films and answered questions. I recommend she increase the gabapentin to 300 mg 3 times daily for week and further as tolerated and needed he did have this is not effective may consider switching to Topamax in the future. Check CT scan of the maxillary region to rule out any bony or structural pathology. Check MRI scan of  the brain and cervical spine. Check ESR. Her clinical presentation is quite typical for temporal arteritis and hence will hold off on temporal artery biopsy or temporal artery ultrasound at the present time. Return for followup in 6 weeks or earlier if necessary   Note: This document was prepared with digital dictation and possible smart phrase technology. Any transcriptional errors that result from this process are unintentional.

## 2014-04-01 ENCOUNTER — Telehealth: Payer: Self-pay | Admitting: *Deleted

## 2014-04-01 ENCOUNTER — Ambulatory Visit (INDEPENDENT_AMBULATORY_CARE_PROVIDER_SITE_OTHER): Payer: Medicare Other | Admitting: General Surgery

## 2014-04-04 NOTE — Telephone Encounter (Signed)
closed

## 2014-04-06 ENCOUNTER — Ambulatory Visit
Admission: RE | Admit: 2014-04-06 | Discharge: 2014-04-06 | Disposition: A | Discharge status: Home / Self Care | Payer: Medicare Other | Source: Ambulatory Visit | Attending: Neurology | Admitting: Neurology

## 2014-04-06 DIAGNOSIS — R209 Unspecified disturbances of skin sensation: Secondary | ICD-10-CM

## 2014-04-06 MED ORDER — GADOBENATE DIMEGLUMINE 529 MG/ML IV SOLN
20.0000 mL | Freq: Once | INTRAVENOUS | Status: AC | PRN
  Administered 2014-04-06: 20 mL via INTRAVENOUS

## 2014-04-07 ENCOUNTER — Ambulatory Visit
Admission: RE | Admit: 2014-04-07 | Discharge: 2014-04-07 | Disposition: A | Discharge status: Home / Self Care | Payer: Medicare Other | Source: Ambulatory Visit | Attending: Neurology | Admitting: Neurology

## 2014-04-07 DIAGNOSIS — R209 Unspecified disturbances of skin sensation: Secondary | ICD-10-CM

## 2014-04-07 MED ORDER — IOHEXOL 300 MG/ML  SOLN
75.0000 mL | Freq: Once | INTRAMUSCULAR | Status: AC | PRN
  Administered 2014-04-07: 75 mL via INTRAVENOUS

## 2014-04-13 ENCOUNTER — Emergency Department (HOSPITAL_COMMUNITY)
Admission: EM | Admit: 2014-04-13 | Discharge: 2014-04-13 | Disposition: A | Discharge status: Home / Self Care | Payer: Medicare Other | Attending: Emergency Medicine | Admitting: Emergency Medicine

## 2014-04-13 ENCOUNTER — Encounter (HOSPITAL_COMMUNITY): Payer: Self-pay | Admitting: Emergency Medicine

## 2014-04-13 ENCOUNTER — Emergency Department (HOSPITAL_COMMUNITY): Payer: Medicare Other

## 2014-04-13 DIAGNOSIS — M810 Age-related osteoporosis without current pathological fracture: Secondary | ICD-10-CM | POA: Insufficient documentation

## 2014-04-13 DIAGNOSIS — Z862 Personal history of diseases of the blood and blood-forming organs and certain disorders involving the immune mechanism: Secondary | ICD-10-CM | POA: Insufficient documentation

## 2014-04-13 DIAGNOSIS — Z7982 Long term (current) use of aspirin: Secondary | ICD-10-CM | POA: Insufficient documentation

## 2014-04-13 DIAGNOSIS — M129 Arthropathy, unspecified: Secondary | ICD-10-CM | POA: Insufficient documentation

## 2014-04-13 DIAGNOSIS — Z87891 Personal history of nicotine dependence: Secondary | ICD-10-CM | POA: Diagnosis not present

## 2014-04-13 DIAGNOSIS — F411 Generalized anxiety disorder: Secondary | ICD-10-CM | POA: Insufficient documentation

## 2014-04-13 DIAGNOSIS — I10 Essential (primary) hypertension: Secondary | ICD-10-CM | POA: Diagnosis not present

## 2014-04-13 DIAGNOSIS — IMO0001 Reserved for inherently not codable concepts without codable children: Secondary | ICD-10-CM | POA: Diagnosis not present

## 2014-04-13 DIAGNOSIS — R05 Cough: Secondary | ICD-10-CM | POA: Insufficient documentation

## 2014-04-13 DIAGNOSIS — I2510 Atherosclerotic heart disease of native coronary artery without angina pectoris: Secondary | ICD-10-CM | POA: Diagnosis not present

## 2014-04-13 DIAGNOSIS — I252 Old myocardial infarction: Secondary | ICD-10-CM | POA: Insufficient documentation

## 2014-04-13 DIAGNOSIS — R059 Cough, unspecified: Secondary | ICD-10-CM

## 2014-04-13 DIAGNOSIS — K219 Gastro-esophageal reflux disease without esophagitis: Secondary | ICD-10-CM | POA: Insufficient documentation

## 2014-04-13 DIAGNOSIS — R51 Headache: Secondary | ICD-10-CM | POA: Diagnosis not present

## 2014-04-13 DIAGNOSIS — Z79899 Other long term (current) drug therapy: Secondary | ICD-10-CM | POA: Diagnosis not present

## 2014-04-13 DIAGNOSIS — Z9861 Coronary angioplasty status: Secondary | ICD-10-CM | POA: Insufficient documentation

## 2014-04-13 DIAGNOSIS — Z8639 Personal history of other endocrine, nutritional and metabolic disease: Secondary | ICD-10-CM | POA: Insufficient documentation

## 2014-04-13 MED ORDER — BENZONATATE 100 MG PO CAPS
100.0000 mg | ORAL_CAPSULE | Freq: Once | ORAL | Status: AC
  Administered 2014-04-13: 100 mg via ORAL
  Filled 2014-04-13: qty 1

## 2014-04-13 MED ORDER — LEVOFLOXACIN 500 MG PO TABS
500.0000 mg | ORAL_TABLET | Freq: Once | ORAL | Status: AC
  Administered 2014-04-13: 500 mg via ORAL
  Filled 2014-04-13: qty 1

## 2014-04-13 MED ORDER — BENZONATATE 100 MG PO CAPS: 100.0000 mg | ORAL_CAPSULE | Freq: Three times a day (TID) | ORAL | Status: DC | PRN

## 2014-04-13 MED ORDER — LEVOFLOXACIN 500 MG PO TABS: 500.0000 mg | ORAL_TABLET | Freq: Every day | ORAL | Status: DC

## 2014-04-13 NOTE — ED Provider Notes (Signed)
CSN: [id number]     Arrival date & time 04/13/14  0232 History   First MD Initiated Contact with Patient 04/13/14 0456     No chief complaint on file.    (Consider location/radiation/quality/duration/timing/severity/associated sxs/prior Treatment) HPI Patient states she's had dry cough for the past few weeks. It is worse over the last day. She states now productive of yellow phlegm. She denies any shortness of breath or chest pain. She does have a mild headache when coughing. She is asking for something for her cough. She's concerned that she may have pneumonia and this is the reason she presented to the emergency department. Past Medical History  Diagnosis Date  . Coronary artery disease   . Hypertension   . Anxiety   . Fibromyalgia   . Dysrhythmia     history of "irregular heart rate"'; pt. thinks it was A-fib  . Vertigo 08/27/2006    [name] 08/27/2006 (01/27/2013)  . Ventricular hypertrophy     [name] 08/27/2006 (01/27/2013)  . High cholesterol   . Anginal pain   . Exertional shortness of breath     "sometimes" (01/27/2013)  . History of stomach ulcers     "years ago" (528/2014)  . GERD (gastroesophageal reflux disease)   . Arthritis     "q where" (01/27/2013)  . Osteoporosis   . Acute myocardial infarction, unspecified site, initial episode of care   . Acute myocardial infarction of other lateral wall, initial episode of care    Past Surgical History  Procedure Laterality Date  . Hernia repair    . Laparotomy      "day after oophorectomy; had bowel obstruction" (01/27/2013)  . Coronary angioplasty with stent placement  06/2005; 01/27/2013    stent to LAD/notes 08/27/2006; "+ 1 stent today" (01/27/2013)  . Cardiac catheterization  10/2006    [name] 08/03/2007 (01/27/2013)  . Tonsillectomy and adenoidectomy  ~ 1952  . Abdominal hysterectomy  1970's  . Laparoscopic salpingo oopherectomy  2000  . Diagnostic laparoscopy  2000    lap abdominal lysis of adhesions  . Breast  lumpectomy Right 1970's    benign   . Breast biopsy Right 1970's   Family History  Problem Relation Age of Onset  . Other Mother   . Other Father    History  Substance Use Topics  . Smoking status: Former Smoker -- 0.12 packs/day for 36 years    Types: Cigarettes    Quit date: 12/02/1991  . Smokeless tobacco: Never Used  . Alcohol Use: No   OB History   Grav Para Term Preterm Abortions TAB SAB Ect Mult Living                 Review of Systems  Constitutional: Negative for fever and chills.  HENT: Positive for sore throat. Negative for congestion, rhinorrhea and sinus pressure.   Respiratory: Positive for cough. Negative for chest tightness, shortness of breath and wheezing.   Cardiovascular: Negative for chest pain, palpitations and leg swelling.  Gastrointestinal: Negative for nausea, vomiting, abdominal pain and diarrhea.  Musculoskeletal: Negative for back pain, myalgias, neck pain and neck stiffness.  Skin: Negative for rash and wound.  Neurological: Positive for headaches. Negative for dizziness, weakness, light-headedness and numbness.  All other systems reviewed and are negative.     Allergies  Codeine  Home Medications   Prior to Admission medications   Medication Sig Start Date End Date Taking? Authorizing Provider  albuterol (PROVENTIL HFA;VENTOLIN HFA) 108 (90 BASE) MCG/ACT inhaler Inhale 2  puffs into the lungs every 6 (six) hours as needed for wheezing.    Historical Provider, MD  ALPRAZolam Prudy Feeler) 0.25 MG tablet Take 0.25 mg by mouth 3 (three) times daily as needed. anxiety    Historical Provider, MD  aspirin EC 81 MG tablet Take 81 mg by mouth daily.      Historical Provider, MD  clopidogrel (PLAVIX) 75 MG tablet take 1 tablet by mouth once daily    Lyn Records III, MD  fluticasone [facility]) 50 MCG/ACT nasal spray Place 2 sprays into the nose daily as needed for rhinitis.    Historical Provider, MD  furosemide (LASIX) 20 MG tablet Take 10 mg by mouth  daily.     Historical Provider, MD  gabapentin (NEURONTIN) 300 MG capsule Take 1 capsule (300 mg total) by mouth 3 (three) times daily. 03/24/14   Delia Heady, MD  HYDROcodone-acetaminophen (NORCO/VICODIN) 5-325 MG per tablet Take 1 tablet by mouth every 6 (six) hours as needed for moderate pain.  12/24/13   Historical Provider, MD  loratadine (CLARITIN) 10 MG tablet Take 10 mg by mouth daily as needed. allergies     Historical Provider, MD  LOTEMAX 0.5 % GEL Place 1 drop into both eyes daily.  02/12/14   Historical Provider, MD  metoCLOPramide (REGLAN) 10 MG tablet Take 1 tablet (10 mg total) by mouth every 6 (six) hours as needed for nausea (or headache). 03/18/14   Dione Booze, MD  nitroGLYCERIN (NITROSTAT) 0.4 MG SL tablet Place 1 tablet (0.4 mg total) under the tongue every 5 (five) minutes as needed for chest pain (CP or SOB). 01/28/13   Lyn Records III, MD  potassium chloride SA (K-DUR,KLOR-CON) 20 MEQ tablet Take 20 mEq by mouth daily.    Historical Provider, MD  predniSONE (DELTASONE) 10 MG tablet Take 20 mg by mouth 2 (two) times daily with a meal.    Historical Provider, MD  Probiotic Product (ALIGN) 4 MG CAPS Take 1 capsule by mouth daily.    Historical Provider, MD  valsartan-hydrochlorothiazide (DIOVAN-HCT) 160-12.5 MG per tablet Take 1 tablet by mouth daily.  07/05/13   Historical Provider, MD  zolpidem (AMBIEN) 10 MG tablet Take 5 mg by mouth daily as needed for sleep.     Historical Provider, MD   There were no vitals taken for this visit. Physical Exam  Nursing note and vitals reviewed. Constitutional: She is oriented to person, place, and time. She appears well-developed and well-nourished. No distress.  HENT:  Head: Normocephalic and atraumatic.  Mouth/Throat: Oropharynx is clear and moist. No oropharyngeal exudate.  Eyes: EOM are normal. Pupils are equal, round, and reactive to light.  Neck: Normal range of motion. Neck supple.  Cardiovascular: Normal rate and regular rhythm.    Pulmonary/Chest: Effort normal and breath sounds normal. No respiratory distress. She has no wheezes. She has no rales. She exhibits no tenderness.  Abdominal: Soft. Bowel sounds are normal. She exhibits no distension and no mass. There is no tenderness. There is no rebound and no guarding.  Musculoskeletal: Normal range of motion. She exhibits no edema and no tenderness.  No calf swelling or tenderness.  Neurological: She is alert and oriented to person, place, and time.  Moves all extremities without deficit. Sensation is grossly intact.  Skin: Skin is warm and dry. No rash noted. No erythema.  Psychiatric: She has a normal mood and affect. Her behavior is normal.    ED Course  Procedures (including critical care time) Labs Review  Labs Reviewed - No data to display  Imaging Review No results found.   EKG Interpretation None      MDM   Final diagnoses:  None   Patient states she is feeling better after the cough medication. Her vital signs remained stable. She's satting mid to high 90s on room air. She is in no distress. Questionable early pneumonia on chest x-ray the radiologist thinks likely atelectasis. Will start on antibiotics and patient is to followup with her primary Dr. Geronimo RunningShe's been given strict return precautions and is voiced understanding.     Loren Raceravid Buck Mcaffee, MD 04/13/14 [phone]

## 2014-04-13 NOTE — ED Notes (Signed)
See downtime notes.

## 2014-04-13 NOTE — ED Notes (Signed)
Pharmacy notified for med  

## 2014-04-13 NOTE — Discharge Instructions (Signed)
Cough, Adult  A cough is a reflex that helps clear your throat and airways. It can help heal the body or may be a reaction to an irritated airway. A cough may only last 2 or 3 weeks (acute) or may last more than 8 weeks (chronic).  CAUSES Acute cough:  Viral or bacterial infections. Chronic cough:  Infections.  Allergies.  Asthma.  Post-nasal drip.  Smoking.  Heartburn or acid reflux.  Some medicines.  Chronic lung problems (COPD).  Cancer. SYMPTOMS   Cough.  Fever.  Chest pain.  Increased breathing rate.  High-pitched whistling sound when breathing (wheezing).  Colored mucus that you cough up (sputum). TREATMENT   A bacterial cough may be treated with antibiotic medicine.  A viral cough must run its course and will not respond to antibiotics.  Your caregiver may recommend other treatments if you have a chronic cough. HOME CARE INSTRUCTIONS   Only take over-the-counter or prescription medicines for pain, discomfort, or fever as directed by your caregiver. Use cough suppressants only as directed by your caregiver.  Use a cold steam vaporizer or humidifier in your bedroom or home to help loosen secretions.  Sleep in a semi-upright position if your cough is worse at night.  Rest as needed.  Stop smoking if you smoke. SEEK IMMEDIATE MEDICAL CARE IF:   You have pus in your sputum.  Your cough starts to worsen.  You cannot control your cough with suppressants and are losing sleep.  You begin coughing up blood.  You have difficulty breathing.  You develop pain which is getting worse or is uncontrolled with medicine.  You have a fever. MAKE SURE YOU:   Understand these instructions.  Will watch your condition.  Will get help right away if you are not doing well or get worse. Document Released: 02/15/2011 Document Revised: 11/11/2011 Document Reviewed: 02/15/2011 Exit[employer] Patient Information 2015 [employer]. This information is not intended  to replace advice given to you by your health care provider. Make sure you discuss any questions you have with your health care provider.  

## 2014-04-13 NOTE — ED Notes (Signed)
See downtime charting for previous events.

## 2014-04-21 ENCOUNTER — Telehealth: Payer: Self-pay | Admitting: Neurology

## 2014-04-21 NOTE — Telephone Encounter (Signed)
Called patient left voice mail asking her to call me back.

## 2014-04-21 NOTE — Telephone Encounter (Signed)
Patient calling to state that she has not heard from an oral surgeon regarding her referral, please return call to patient and advise.

## 2014-04-22 NOTE — Telephone Encounter (Signed)
Patient stated somebody called and order test for her to see a oral surgeon. I called her back and consulted with Dr. Pearlean BrownieSethi  He stated he have talked her referring  Doctor and that's who called her with results and recommendations. I asked her to return her doctors  phone call. Patient stated when she got the phone call she was a little out of it . I also told her if she does not get any results to please give me a call back so i can let Dr. Pearlean BrownieSethi handle it .

## 2014-05-02 ENCOUNTER — Inpatient Hospital Stay (HOSPITAL_COMMUNITY)
Admission: EM | Admit: 2014-05-02 | Discharge: 2014-05-06 | DRG: 196 | Disposition: A | Discharge status: Home / Self Care | Payer: Medicare Other | Attending: Internal Medicine | Admitting: Internal Medicine

## 2014-05-02 ENCOUNTER — Telehealth: Payer: Self-pay | Admitting: Interventional Cardiology

## 2014-05-02 ENCOUNTER — Emergency Department (HOSPITAL_COMMUNITY): Payer: Medicare Other

## 2014-05-02 ENCOUNTER — Encounter (HOSPITAL_COMMUNITY): Payer: Self-pay | Admitting: Emergency Medicine

## 2014-05-02 DIAGNOSIS — Z87891 Personal history of nicotine dependence: Secondary | ICD-10-CM | POA: Diagnosis not present

## 2014-05-02 DIAGNOSIS — R0602 Shortness of breath: Secondary | ICD-10-CM | POA: Diagnosis not present

## 2014-05-02 DIAGNOSIS — J8410 Pulmonary fibrosis, unspecified: Secondary | ICD-10-CM | POA: Diagnosis present

## 2014-05-02 DIAGNOSIS — Z79899 Other long term (current) drug therapy: Secondary | ICD-10-CM

## 2014-05-02 DIAGNOSIS — J189 Pneumonia, unspecified organism: Secondary | ICD-10-CM | POA: Diagnosis present

## 2014-05-02 DIAGNOSIS — M810 Age-related osteoporosis without current pathological fracture: Secondary | ICD-10-CM | POA: Diagnosis present

## 2014-05-02 DIAGNOSIS — R05 Cough: Secondary | ICD-10-CM

## 2014-05-02 DIAGNOSIS — F411 Generalized anxiety disorder: Secondary | ICD-10-CM | POA: Diagnosis present

## 2014-05-02 DIAGNOSIS — K219 Gastro-esophageal reflux disease without esophagitis: Secondary | ICD-10-CM | POA: Diagnosis present

## 2014-05-02 DIAGNOSIS — I10 Essential (primary) hypertension: Secondary | ICD-10-CM

## 2014-05-02 DIAGNOSIS — Z9861 Coronary angioplasty status: Secondary | ICD-10-CM | POA: Diagnosis not present

## 2014-05-02 DIAGNOSIS — R42 Dizziness and giddiness: Secondary | ICD-10-CM

## 2014-05-02 DIAGNOSIS — G609 Hereditary and idiopathic neuropathy, unspecified: Secondary | ICD-10-CM | POA: Diagnosis present

## 2014-05-02 DIAGNOSIS — I2510 Atherosclerotic heart disease of native coronary artery without angina pectoris: Secondary | ICD-10-CM | POA: Diagnosis present

## 2014-05-02 DIAGNOSIS — Z7982 Long term (current) use of aspirin: Secondary | ICD-10-CM | POA: Diagnosis not present

## 2014-05-02 DIAGNOSIS — E7800 Pure hypercholesterolemia, unspecified: Secondary | ICD-10-CM | POA: Diagnosis present

## 2014-05-02 DIAGNOSIS — R059 Cough, unspecified: Secondary | ICD-10-CM

## 2014-05-02 DIAGNOSIS — Z7902 Long term (current) use of antithrombotics/antiplatelets: Secondary | ICD-10-CM

## 2014-05-02 DIAGNOSIS — R1030 Lower abdominal pain, unspecified: Secondary | ICD-10-CM

## 2014-05-02 DIAGNOSIS — E785 Hyperlipidemia, unspecified: Secondary | ICD-10-CM

## 2014-05-02 DIAGNOSIS — M797 Fibromyalgia: Secondary | ICD-10-CM

## 2014-05-02 DIAGNOSIS — F4323 Adjustment disorder with mixed anxiety and depressed mood: Secondary | ICD-10-CM

## 2014-05-02 DIAGNOSIS — I252 Old myocardial infarction: Secondary | ICD-10-CM

## 2014-05-02 DIAGNOSIS — IMO0001 Reserved for inherently not codable concepts without codable children: Secondary | ICD-10-CM | POA: Diagnosis present

## 2014-05-02 DIAGNOSIS — R918 Other nonspecific abnormal finding of lung field: Secondary | ICD-10-CM

## 2014-05-02 DIAGNOSIS — R200 Anesthesia of skin: Secondary | ICD-10-CM

## 2014-05-02 DIAGNOSIS — R300 Dysuria: Secondary | ICD-10-CM

## 2014-05-02 DIAGNOSIS — R0600 Dyspnea, unspecified: Secondary | ICD-10-CM

## 2014-05-02 DIAGNOSIS — G501 Atypical facial pain: Secondary | ICD-10-CM

## 2014-05-02 DIAGNOSIS — N952 Postmenopausal atrophic vaginitis: Secondary | ICD-10-CM

## 2014-05-02 LAB — CBC
HCT: 36.7 % (ref 36.0–46.0)
HEMOGLOBIN: 12 g/dL (ref 12.0–15.0)
MCH: 27.2 pg (ref 26.0–34.0)
MCHC: 32.7 g/dL (ref 30.0–36.0)
MCV: 83.2 fL (ref 78.0–100.0)
Platelets: 303 10*3/uL (ref 150–400)
RBC: 4.41 MIL/uL (ref 3.87–5.11)
RDW: 14.7 % (ref 11.5–15.5)
WBC: 5.6 10*3/uL (ref 4.0–10.5)

## 2014-05-02 LAB — BASIC METABOLIC PANEL
Anion gap: 13 (ref 5–15)
BUN: 15 mg/dL (ref 6–23)
CALCIUM: 9.4 mg/dL (ref 8.4–10.5)
CHLORIDE: 102 meq/L (ref 96–112)
CO2: 23 meq/L (ref 19–32)
Creatinine, Ser: 1.01 mg/dL (ref 0.50–1.10)
GFR calc Af Amer: 62 mL/min — ABNORMAL LOW (ref >90)
GFR calc non Af Amer: 53 mL/min — ABNORMAL LOW (ref >90)
GLUCOSE: 111 mg/dL — AB (ref 70–99)
Potassium: 3.6 mEq/L — ABNORMAL LOW (ref 3.7–5.3)
SODIUM: 138 meq/L (ref 137–147)

## 2014-05-02 LAB — I-STAT TROPONIN, ED: Troponin i, poc: 0 ng/mL (ref 0.00–0.08)

## 2014-05-02 LAB — PRO B NATRIURETIC PEPTIDE: Pro B Natriuretic peptide (BNP): 51.5 pg/mL (ref 0–450)

## 2014-05-02 MED ORDER — NITROGLYCERIN 0.4 MG SL SUBL
0.4000 mg | SUBLINGUAL_TABLET | SUBLINGUAL | Status: DC | PRN
  Filled 2014-05-02: qty 1

## 2014-05-02 MED ORDER — DEXTROSE 5 % IV SOLN
1.0000 g | INTRAVENOUS | Status: DC
  Administered 2014-05-02: 1 g via INTRAVENOUS
  Filled 2014-05-02: qty 10

## 2014-05-02 MED ORDER — DIFLUPREDNATE 0.05 % OP EMUL
1.0000 [drp] | OPHTHALMIC | Status: DC
Start: 2014-05-02 — End: 2014-05-06
  Administered 2014-05-04 – 2014-05-06 (×2): 1 [drp] via OPHTHALMIC

## 2014-05-02 MED ORDER — IRBESARTAN 150 MG PO TABS
150.0000 mg | ORAL_TABLET | Freq: Every day | ORAL | Status: DC
  Administered 2014-05-03 – 2014-05-06 (×4): 150 mg via ORAL
  Filled 2014-05-02 (×3): qty 1
  Filled 2014-05-02: qty 2
  Filled 2014-05-02: qty 1

## 2014-05-02 MED ORDER — DEXTROSE 5 % IV SOLN: 1.0000 g | INTRAVENOUS | Status: DC

## 2014-05-02 MED ORDER — LORATADINE 10 MG PO TABS
10.0000 mg | ORAL_TABLET | Freq: Every day | ORAL | Status: DC
  Administered 2014-05-03 – 2014-05-06 (×4): 10 mg via ORAL
  Filled 2014-05-02 (×5): qty 1

## 2014-05-02 MED ORDER — ALPRAZOLAM 0.25 MG PO TABS
0.2500 mg | ORAL_TABLET | Freq: Two times a day (BID) | ORAL | Status: DC
  Administered 2014-05-02 – 2014-05-06 (×8): 0.25 mg via ORAL
  Filled 2014-05-02 (×8): qty 1

## 2014-05-02 MED ORDER — DEXTROSE 5 % IV SOLN
1.0000 g | INTRAVENOUS | Status: DC
  Administered 2014-05-03 – 2014-05-05 (×3): 1 g via INTRAVENOUS
  Filled 2014-05-02 (×4): qty 10

## 2014-05-02 MED ORDER — NITROGLYCERIN 0.4 MG SL SUBL: 0.4000 mg | SUBLINGUAL_TABLET | SUBLINGUAL | Status: DC | PRN

## 2014-05-02 MED ORDER — VALSARTAN-HYDROCHLOROTHIAZIDE 160-12.5 MG PO TABS: 1.0000 | ORAL_TABLET | Freq: Every day | ORAL | Status: DC

## 2014-05-02 MED ORDER — HEPARIN SODIUM (PORCINE) 5000 UNIT/ML IJ SOLN
5000.0000 [IU] | Freq: Three times a day (TID) | INTRAMUSCULAR | Status: DC
  Administered 2014-05-02 – 2014-05-06 (×9): 5000 [IU] via SUBCUTANEOUS
  Filled 2014-05-02 (×17): qty 1

## 2014-05-02 MED ORDER — ACETAMINOPHEN 500 MG PO TABS
500.0000 mg | ORAL_TABLET | Freq: Every day | ORAL | Status: DC | PRN
  Administered 2014-05-05: 500 mg via ORAL
  Filled 2014-05-02: qty 1

## 2014-05-02 MED ORDER — HYDROCODONE-ACETAMINOPHEN 5-325 MG PO TABS
0.5000 | ORAL_TABLET | Freq: Every day | ORAL | Status: DC | PRN
  Administered 2014-05-02 – 2014-05-05 (×4): 1 via ORAL
  Filled 2014-05-02 (×5): qty 1

## 2014-05-02 MED ORDER — NEPAFENAC 0.3 % OP SUSP
1.0000 [drp] | OPHTHALMIC | Status: DC
  Administered 2014-05-04: 1 [drp] via OPHTHALMIC

## 2014-05-02 MED ORDER — DEXTROSE 5 % IV SOLN: 500.0000 mg | INTRAVENOUS | Status: DC

## 2014-05-02 MED ORDER — ALBUTEROL SULFATE HFA 108 (90 BASE) MCG/ACT IN AERS: 2.0000 | INHALATION_SPRAY | Freq: Four times a day (QID) | RESPIRATORY_TRACT | Status: DC | PRN

## 2014-05-02 MED ORDER — ASPIRIN 81 MG PO CHEW
324.0000 mg | CHEWABLE_TABLET | Freq: Once | ORAL | Status: AC
  Administered 2014-05-02: 324 mg via ORAL
  Filled 2014-05-02: qty 4

## 2014-05-02 MED ORDER — DEXTROSE 5 % IV SOLN
500.0000 mg | INTRAVENOUS | Status: DC
  Administered 2014-05-03 – 2014-05-05 (×3): 500 mg via INTRAVENOUS
  Filled 2014-05-02 (×4): qty 500

## 2014-05-02 MED ORDER — GABAPENTIN 300 MG PO CAPS
300.0000 mg | ORAL_CAPSULE | Freq: Three times a day (TID) | ORAL | Status: DC
  Administered 2014-05-02 – 2014-05-06 (×10): 300 mg via ORAL
  Filled 2014-05-02 (×15): qty 1

## 2014-05-02 MED ORDER — ALBUTEROL SULFATE (2.5 MG/3ML) 0.083% IN NEBU
2.5000 mg | INHALATION_SOLUTION | Freq: Four times a day (QID) | RESPIRATORY_TRACT | Status: DC | PRN
Start: 2014-05-02 — End: 2014-05-06
  Administered 2014-05-06: 2.5 mg via RESPIRATORY_TRACT
  Filled 2014-05-02: qty 3

## 2014-05-02 MED ORDER — HYDROCHLOROTHIAZIDE 12.5 MG PO CAPS
12.5000 mg | ORAL_CAPSULE | Freq: Every day | ORAL | Status: DC
  Administered 2014-05-03 – 2014-05-06 (×4): 12.5 mg via ORAL
  Filled 2014-05-02 (×6): qty 1

## 2014-05-02 MED ORDER — ASPIRIN EC 81 MG PO TBEC
81.0000 mg | DELAYED_RELEASE_TABLET | Freq: Every day | ORAL | Status: DC
  Administered 2014-05-03 – 2014-05-06 (×4): 81 mg via ORAL
  Filled 2014-05-02 (×5): qty 1

## 2014-05-02 MED ORDER — FUROSEMIDE 20 MG PO TABS
10.0000 mg | ORAL_TABLET | Freq: Every day | ORAL | Status: DC
  Administered 2014-05-03 – 2014-05-06 (×4): 10 mg via ORAL
  Filled 2014-05-02: qty 1
  Filled 2014-05-02: qty 0.5
  Filled 2014-05-02: qty 1
  Filled 2014-05-02 (×3): qty 0.5

## 2014-05-02 MED ORDER — MORPHINE SULFATE 2 MG/ML IJ SOLN
2.0000 mg | INTRAMUSCULAR | Status: DC | PRN
  Administered 2014-05-03: 2 mg via INTRAVENOUS
  Administered 2014-05-03: 4 mg via INTRAVENOUS
  Filled 2014-05-02: qty 1
  Filled 2014-05-02: qty 2

## 2014-05-02 MED ORDER — ZOLPIDEM TARTRATE 5 MG PO TABS: 5.0000 mg | ORAL_TABLET | Freq: Every day | ORAL | Status: DC | PRN

## 2014-05-02 MED ORDER — POTASSIUM CHLORIDE CRYS ER 20 MEQ PO TBCR
20.0000 meq | EXTENDED_RELEASE_TABLET | Freq: Every day | ORAL | Status: DC
  Administered 2014-05-03 – 2014-05-06 (×4): 20 meq via ORAL
  Filled 2014-05-02 (×5): qty 1

## 2014-05-02 MED ORDER — AZITHROMYCIN 500 MG IV SOLR: 500.0000 mg | INTRAVENOUS | Status: DC

## 2014-05-02 MED ORDER — CLOPIDOGREL BISULFATE 75 MG PO TABS
75.0000 mg | ORAL_TABLET | Freq: Every day | ORAL | Status: DC
  Administered 2014-05-03 – 2014-05-06 (×4): 75 mg via ORAL
  Filled 2014-05-02 (×6): qty 1

## 2014-05-02 NOTE — ED Notes (Signed)
MD at bedside. 

## 2014-05-02 NOTE — ED Notes (Signed)
Pt reports since yesterday has been SOB and diaphoretic during over activity. Reports tightness in chest. Reports nauseated. Today took bp and was low call Dr. Garnette Scheuermann, told to come here. Pt is a x 4

## 2014-05-02 NOTE — Telephone Encounter (Signed)
Spoke with pt. She states has been having SOB which gets worse with any activity, sweating and dry cough  for the last 3 days. This AM pt was so SOB after she took a shower.  Saturday she had an episode of SOB, she was sweating pressingly her BP was 96/66 and 112 systolic her heart rate was 101 beats/ minute. Her BP now is 151/85. Pt denies chest pain. Pt had stents placement in May 2014 pt denies chest pain. Pt states is short of breath when resting but the SOB is worse with any activity. Pt said that her SOB and sweating was the same as before she had the stent placement.  Dr. Katrinka Blazing aware of symptoms and recommended for pt to go to the ER.  Pt aware she verbalized understanding. Card Master Daun Peacock paged to let her know of pt's arrival to the ER.  Card Master Daun Peacock  Aware of pt's arrival to the ER.

## 2014-05-02 NOTE — H&P (Signed)
[facility] History and Physical  [name] ZOX:[id number] DOB: 1936/08/27 DOA: 05/02/2014  Referring physician: EDP PCP: [name]., MD   Chief Complaint: SOB   HPI: [name] is a 78 y.o. female who presents to the ED with 3 week history of cough, SOB.  Started with dry cough 3 weeks ago and became more productive of purulent sputum recently.  SOB has been worse over the last 2 days.  Saw ED on 8/12, diagnosed at that time with possible PNA vs atelectasis in her left lower lobe, sent home on levaquin.  Despite levaquin course symptoms persisted then after levoquin course have worsened so she returns to the ED today where CXR now shows more patchy opacity in the LLL now even more favoring PNA.  Review of Systems: No fevers nor chills, does have pleuritic chest pain from all the coughing, Systems reviewed.  As above, otherwise negative  Past Medical History  Diagnosis Date  . Coronary artery disease   . Hypertension   . Anxiety   . Fibromyalgia   . Dysrhythmia     history of "irregular heart rate"'; pt. thinks it was A-fib  . Vertigo 08/27/2006    [name] 08/27/2006 (01/27/2013)  . Ventricular hypertrophy     [name] 08/27/2006 (01/27/2013)  . High cholesterol   . Anginal pain   . Exertional shortness of breath     "sometimes" (01/27/2013)  . History of stomach ulcers     "years ago" (528/2014)  . GERD (gastroesophageal reflux disease)   . Arthritis     "q where" (01/27/2013)  . Osteoporosis   . Acute myocardial infarction, unspecified site, initial episode of care   . Acute myocardial infarction of other lateral wall, initial episode of care    Past Surgical History  Procedure Laterality Date  . Hernia repair    . Laparotomy      "day after oophorectomy; had bowel obstruction" (01/27/2013)  . Coronary angioplasty with stent placement  06/2005; 01/27/2013    stent to LAD/notes 08/27/2006; "+ 1 stent today" (01/27/2013)  . Cardiac  catheterization  10/2006    [name] 08/03/2007 (01/27/2013)  . Tonsillectomy and adenoidectomy  ~ 1952  . Abdominal hysterectomy  1970's  . Laparoscopic salpingo oopherectomy  2000  . Diagnostic laparoscopy  2000    lap abdominal lysis of adhesions  . Breast lumpectomy Right 1970's    benign   . Breast biopsy Right 1970's   Social History:  reports that she quit smoking about 22 years ago. Her smoking use included Cigarettes. She has a 4.32 pack-year smoking history. She has never used smokeless tobacco. She reports that she does not drink alcohol or use illicit drugs.  Allergies  Allergen Reactions  . Codeine Itching    Tolerates low dose of norco    Family History  Problem Relation Age of Onset  . Other Mother   . Other Father      Prior to Admission medications   Medication Sig Start Date End Date Taking? Authorizing Provider  acetaminophen (TYLENOL) 500 MG tablet Take 500 mg by mouth daily as needed (pain).   Yes Historical Provider, MD  albuterol (PROVENTIL HFA;VENTOLIN HFA) 108 (90 BASE) MCG/ACT inhaler Inhale 2 puffs into the lungs every 6 (six) hours as needed for wheezing.   Yes Historical Provider, MD  ALPRAZolam (XANAX) 0.25 MG tablet Take 0.25 mg by mouth 2 (two) times daily. anxiety   Yes Historical Provider, MD  aspirin EC 81 MG tablet Take  81 mg by mouth daily.     Yes Historical Provider, MD  clopidogrel (PLAVIX) 75 MG tablet Take 75 mg by mouth daily.   Yes Historical Provider, MD  Difluprednate (DUREZOL) 0.05 % EMUL Place 1 drop into the left eye every other day. 10 day course started 04/25/14   Yes Historical Provider, MD  furosemide (LASIX) 20 MG tablet Take 10 mg by mouth daily.    Yes Historical Provider, MD  gabapentin (NEURONTIN) 300 MG capsule Take 1 capsule (300 mg total) by mouth 3 (three) times daily. 03/24/14  Yes Delia Heady, MD  HYDROcodone-acetaminophen (NORCO/VICODIN) 5-325 MG per tablet Take 0.5-1 tablets by mouth daily as needed for moderate pain.   12/24/13  Yes Historical Provider, MD  loratadine (CLARITIN) 10 MG tablet Take 10 mg by mouth daily.    Yes Historical Provider, MD  Nepafenac (ILEVRO) 0.3 % SUSP Apply 1 drop to eye See admin instructions. Instill 1 drop in left eye daily for 3 more days (until 05/05/14), instill 1 drop in right eye daily for 10 days starting 05/01/14   Yes Historical Provider, MD  nitroGLYCERIN (NITROSTAT) 0.4 MG SL tablet Place 1 tablet (0.4 mg total) under the tongue every 5 (five) minutes as needed for chest pain (CP or SOB). 01/28/13  Yes Lyn Records III, MD  potassium chloride SA (K-DUR,KLOR-CON) 20 MEQ tablet Take 20 mEq by mouth daily.   Yes Historical Provider, MD  valsartan-hydrochlorothiazide (DIOVAN-HCT) 160-12.5 MG per tablet Take 1 tablet by mouth daily.  07/05/13  Yes Historical Provider, MD  zolpidem (AMBIEN) 10 MG tablet Take 5 mg by mouth daily as needed for sleep.    Yes Historical Provider, MD   Physical Exam: Filed Vitals:   05/02/14 1930  BP: 128/71  Pulse: 97  Temp:   Resp: 24    BP 128/71  Pulse 97  Temp(Src) 97.9 F (36.6 C) (Oral)  Resp 24  Wt 91.173 kg (201 lb)  SpO2 96%  General Appearance:    Alert, oriented, no distress, appears stated age  Head:    Normocephalic, atraumatic  Eyes:    PERRL, EOMI, sclera non-icteric        Nose:   Nares without drainage or epistaxis. Mucosa, turbinates normal  Throat:   Moist mucous membranes. Oropharynx without erythema or exudate.  Neck:   Supple. No carotid bruits.  No thyromegaly.  No lymphadenopathy.   Back:     No CVA tenderness, no spinal tenderness  Lungs:     Clear to auscultation bilaterally, without wheezes, rhonchi or rales  Chest wall:    No tenderness to palpitation  Heart:    Regular rate and rhythm without murmurs, gallops, rubs  Abdomen:     Soft, non-tender, nondistended, normal bowel sounds, no organomegaly  Genitalia:    deferred  Rectal:    deferred  Extremities:   No clubbing, cyanosis or edema.  Pulses:   2+  and symmetric all extremities  Skin:   Skin color, texture, turgor normal, no rashes or lesions  Lymph nodes:   Cervical, supraclavicular, and axillary nodes normal  Neurologic:   CNII-XII intact. Normal strength, sensation and reflexes      throughout    Labs on Admission:  Basic Metabolic Panel:  Recent Labs Lab 05/02/14 1836  NA 138  K 3.6*  CL 102  CO2 23  GLUCOSE 111*  BUN 15  CREATININE 1.01  CALCIUM 9.4   Liver Function Tests: No results found for this basename:  AST, ALT, ALKPHOS, BILITOT, PROT, ALBUMIN,  in the last 168 hours No results found for this basename: LIPASE, AMYLASE,  in the last 168 hours No results found for this basename: AMMONIA,  in the last 168 hours CBC:  Recent Labs Lab 05/02/14 1836  WBC 5.6  HGB 12.0  HCT 36.7  MCV 83.2  PLT 303   Cardiac Enzymes: No results found for this basename: CKTOTAL, CKMB, CKMBINDEX, TROPONINI,  in the last 168 hours  BNP (last 3 results)  Recent Labs  08/22/13 1203 05/02/14 1921  PROBNP 93.0 51.5   CBG: No results found for this basename: GLUCAP,  in the last 168 hours  Radiological Exams on Admission: Dg Chest 2 View  05/02/2014   CLINICAL DATA:  Shortness of breath, cough  EXAM: CHEST  2 VIEW  COMPARISON:  04/13/2014  FINDINGS: Chronic interstitial markings. Mild patchy left lower lobe opacity, best visualized on the lateral view, suspicious for pneumonia. No pleural effusion or pneumothorax.  The heart is normal in size.  Degenerative changes of the visualized thoracolumbar spine.  IMPRESSION: Mild patchy left lower lobe opacity, suspicious for pneumonia.   Electronically Signed   By: Charline Bills M.D.   On: 05/02/2014 19:59    EKG: Independently reviewed.  Assessment/Plan Active Problems:   CAP (community acquired pneumonia)   1. CAP - failed outpatient treatment 1. On Rocephin and azithromycin 2. CAP pathway 3. Sputum and blood CX pending 4. If treatment with rocephin and azithromycin  fails then next step likely pulm consult for BAL, as well as consideration of non-infectious etiologies (IE COP).   Code Status: Full Code  Family Communication: Husband at bedside Disposition Plan: Admit to inpatient   Time spent: 70 min  GARDNER, JARED M. [facility] Pager [phone]  If 7AM-7PM, please contact the day team taking care of the patient [email or web address] Password [facility] 05/02/2014, 8:41 PM

## 2014-05-02 NOTE — ED Provider Notes (Signed)
CSN: [id number]     Arrival date & time 05/02/14  1825 History   First MD Initiated Contact with Patient 05/02/14 1905     Chief Complaint  Patient presents with  . Shortness of Breath     (Consider location/radiation/quality/duration/timing/severity/associated sxs/prior Treatment) HPI Comments: The patient presents to the ER for evaluation of difficulty breathing and chest tightness. Patient reports that she has been experiencing symptoms for 3 weeks. It started with a dry cough, was seen in the ER and diagnosed with possible pneumonia. She was placed on antibiotic. The cough never really went away. It has become more deep and she is bringing up sputum. In the last 2 days, however, she has noticed significant worsening of her breathing. She gets very short of breath and diaphoretic when she gets up and walks around. She called her cardiologist and was told to come to the ER. She has not had any fever.  Patient is a 78 y.o. female presenting with shortness of breath.  Shortness of Breath   Past Medical History  Diagnosis Date  . Coronary artery disease   . Hypertension   . Anxiety   . Fibromyalgia   . Dysrhythmia     history of "irregular heart rate"'; pt. thinks it was A-fib  . Vertigo 08/27/2006    [name] 08/27/2006 (01/27/2013)  . Ventricular hypertrophy     [name] 08/27/2006 (01/27/2013)  . High cholesterol   . Anginal pain   . Exertional shortness of breath     "sometimes" (01/27/2013)  . History of stomach ulcers     "years ago" (528/2014)  . GERD (gastroesophageal reflux disease)   . Arthritis     "q where" (01/27/2013)  . Osteoporosis   . Acute myocardial infarction, unspecified site, initial episode of care   . Acute myocardial infarction of other lateral wall, initial episode of care    Past Surgical History  Procedure Laterality Date  . Hernia repair    . Laparotomy      "day after oophorectomy; had bowel obstruction" (01/27/2013)  . Coronary angioplasty with stent  placement  06/2005; 01/27/2013    stent to LAD/notes 08/27/2006; "+ 1 stent today" (01/27/2013)  . Cardiac catheterization  10/2006    [name] 08/03/2007 (01/27/2013)  . Tonsillectomy and adenoidectomy  ~ 1952  . Abdominal hysterectomy  1970's  . Laparoscopic salpingo oopherectomy  2000  . Diagnostic laparoscopy  2000    lap abdominal lysis of adhesions  . Breast lumpectomy Right 1970's    benign   . Breast biopsy Right 1970's   Family History  Problem Relation Age of Onset  . Other Mother   . Other Father    History  Substance Use Topics  . Smoking status: Former Smoker -- 0.12 packs/day for 36 years    Types: Cigarettes    Quit date: 12/02/1991  . Smokeless tobacco: Never Used  . Alcohol Use: No   OB History   Grav Para Term Preterm Abortions TAB SAB Ect Mult Living                 Review of Systems  Respiratory: Positive for chest tightness and shortness of breath.   All other systems reviewed and are negative.     Allergies  Codeine  Home Medications   Prior to Admission medications   Medication Sig Start Date End Date Taking? Authorizing Provider  albuterol (PROVENTIL HFA;VENTOLIN HFA) 108 (90 BASE) MCG/ACT inhaler Inhale 2 puffs into the lungs every 6 (six) hours  as needed for wheezing.    Historical Provider, MD  ALPRAZolam Prudy Feeler) 0.25 MG tablet Take 0.25 mg by mouth 3 (three) times daily as needed. anxiety    Historical Provider, MD  aspirin EC 81 MG tablet Take 81 mg by mouth daily.      Historical Provider, MD  benzonatate (TESSALON) 100 MG capsule Take 1 capsule (100 mg total) by mouth 3 (three) times daily as needed for cough. 04/13/14   Loren Racer, MD  clopidogrel (PLAVIX) 75 MG tablet take 1 tablet by mouth once daily    Lyn Records III, MD  DUREZOL 0.05 % [facility]  04/01/14   Historical Provider, MD  fluticasone (FLONASE) 50 MCG/ACT nasal spray Place 2 sprays into the nose daily as needed for rhinitis.    Historical Provider, MD  furosemide (LASIX) 20 MG  tablet Take 10 mg by mouth daily.     Historical Provider, MD  gabapentin (NEURONTIN) 300 MG capsule Take 1 capsule (300 mg total) by mouth 3 (three) times daily. 03/24/14   Delia Heady, MD  HYDROcodone-acetaminophen (NORCO/VICODIN) 5-325 MG per tablet Take 1 tablet by mouth every 6 (six) hours as needed for moderate pain.  12/24/13   Historical Provider, MD  ILEVRO 0.3 % SUSP  04/01/14   Historical Provider, MD  levofloxacin (LEVAQUIN) 500 MG tablet Take 1 tablet (500 mg total) by mouth daily. 04/13/14   Loren Racer, MD  loratadine (CLARITIN) 10 MG tablet Take 10 mg by mouth daily as needed. allergies     Historical Provider, MD  LOTEMAX 0.5 % GEL Place 1 drop into both eyes daily.  02/12/14   Historical Provider, MD  metoCLOPramide (REGLAN) 10 MG tablet Take 1 tablet (10 mg total) by mouth every 6 (six) hours as needed for nausea (or headache). 03/18/14   Dione Booze, MD  nitroGLYCERIN (NITROSTAT) 0.4 MG SL tablet Place 1 tablet (0.4 mg total) under the tongue every 5 (five) minutes as needed for chest pain (CP or SOB). 01/28/13   Lyn Records III, MD  potassium chloride SA (K-DUR,KLOR-CON) 20 MEQ tablet Take 20 mEq by mouth daily.    Historical Provider, MD  predniSONE (DELTASONE) 10 MG tablet Take 20 mg by mouth 2 (two) times daily with a meal.    Historical Provider, MD  Probiotic Product (ALIGN) 4 MG CAPS Take 1 capsule by mouth daily.    Historical Provider, MD  valsartan-hydrochlorothiazide (DIOVAN-HCT) 160-12.5 MG per tablet Take 1 tablet by mouth daily.  07/05/13   Historical Provider, MD  zolpidem (AMBIEN) 10 MG tablet Take 5 mg by mouth daily as needed for sleep.     Historical Provider, MD   BP 145/76  Pulse 106  Temp(Src) 97.9 F (36.6 C) (Oral)  Resp 20  Wt 201 lb (91.173 kg)  SpO2 96% Physical Exam  Constitutional: She is oriented to person, place, and time. She appears well-developed and well-nourished. No distress.  HENT:  Head: Normocephalic and atraumatic.  Right Ear:  Hearing normal.  Left Ear: Hearing normal.  Nose: Nose normal.  Mouth/Throat: Oropharynx is clear and moist and mucous membranes are normal.  Eyes: Conjunctivae and EOM are normal. Pupils are equal, round, and reactive to light.  Neck: Normal range of motion. Neck supple.  Cardiovascular: Regular rhythm, S1 normal and S2 normal.  Tachycardia present.  Exam reveals no gallop and no friction rub.   No murmur heard. Pulmonary/Chest: Effort normal and breath sounds normal. No respiratory distress. She exhibits no tenderness.  Abdominal: Soft. Normal appearance and bowel sounds are normal. There is no hepatosplenomegaly. There is no tenderness. There is no rebound, no guarding, no tenderness at McBurney's point and negative Murphy's sign. No hernia.  Musculoskeletal: Normal range of motion.  Neurological: She is alert and oriented to person, place, and time. She has normal strength. No cranial nerve deficit or sensory deficit. Coordination normal. GCS eye subscore is 4. GCS verbal subscore is 5. GCS motor subscore is 6.  Skin: Skin is warm, dry and intact. No rash noted. No cyanosis.  Psychiatric: She has a normal mood and affect. Her speech is normal and behavior is normal. Thought content normal.    ED Course  Procedures (including critical care time) Labs Review Labs Reviewed  CBC  BASIC METABOLIC PANEL  PRO B NATRIURETIC PEPTIDE  I-STAT TROPOININ, ED    Imaging Review No results found.   EKG Interpretation   Date/Time:  Monday May 02 2014 18:31:05 EDT Ventricular Rate:  110 PR Interval:  122 QRS Duration: 68 QT Interval:  304 QTC Calculation: 411 R Axis:   64 Text Interpretation:  Sinus tachycardia Nonspecific T wave abnormality  Abnormal ECG Confirmed by POLLINA  MD, CHRISTOPHER [phone]) on 05/02/2014  7:06:07 PM      MDM   Final diagnoses:  None   community acquired pneumonia  Presents to the ER for evaluation of increasing shortness of breath over the period of 3  weeks. Patient reports that she has been experiencing intermittent episodes of severe shortness of breath and diaphoresis when she exerts herself. She has had persistent worsening cough. Patient was seen 3 weeks ago, chest x-ray showed atelectasis versus infiltrate, was treated with a course of Levaquin. She completed a course of Levaquin but never improved. Chest x-ray today is more convincing for persistent/worsening pneumonia. Based on her outpatient failure, will require hospitalization. She is stable at this time. She has mild tachycardia secondary to her shortness of breath, but is not hypoxic.     Gilda Crease, MD 05/02/14 2014

## 2014-05-02 NOTE — Telephone Encounter (Signed)
New message     Husband says pt is sob and sweating.  Her bp is 112/76 and pulse 101.  Please advise

## 2014-05-02 NOTE — Consult Note (Signed)
PHARMACY NOTE  CONSULT :  Renal Adjustment of Azithromycin and Ceftriaxone INDICATION :  CAP  ASSESSMENT:  Pharmacy consulted for Renal adjustment of antibiotics for CAP.     Current order for daily Azithromycin and Ceftriaxone written x 7 days.  Dosing Weight  91 kg,  SCr 1, estimated CrCl  54 ml/min.  Patient has no history of hepatic dysfunction.  Currently ordered doses are appropriate and require no adjustments.  No dosage adjustments required with either antibiotic.  PLAN:  1. Continue Azithromycin and Ceftriaxone as previously ordered. 2. Recommend WBC's, fever curve, CXR, any cultures/sensitivities, and clinical progression. 3. Pharmacy will sign off and follow peripherally given no adjustments in doses or schedules are expected.  Pharmacy has alerts in place to alert for dramatic changes in renal function or clinical condition that might require dose or schedule adjustments. 4. Please re-consult if additional assistance is needed.  Thank you for allowing Pharmacy to participate in this patient's care   Laurena Bering,  Pharm.D. ,  05/02/2014,  9:30 PM

## 2014-05-03 ENCOUNTER — Encounter (HOSPITAL_COMMUNITY): Payer: Self-pay | Admitting: Radiology

## 2014-05-03 ENCOUNTER — Inpatient Hospital Stay (HOSPITAL_COMMUNITY): Payer: Medicare Other

## 2014-05-03 DIAGNOSIS — F4323 Adjustment disorder with mixed anxiety and depressed mood: Secondary | ICD-10-CM

## 2014-05-03 DIAGNOSIS — I10 Essential (primary) hypertension: Secondary | ICD-10-CM

## 2014-05-03 DIAGNOSIS — IMO0001 Reserved for inherently not codable concepts without codable children: Secondary | ICD-10-CM

## 2014-05-03 DIAGNOSIS — J189 Pneumonia, unspecified organism: Secondary | ICD-10-CM

## 2014-05-03 DIAGNOSIS — E785 Hyperlipidemia, unspecified: Secondary | ICD-10-CM

## 2014-05-03 LAB — CBC
HEMATOCRIT: 35.6 % — AB (ref 36.0–46.0)
Hemoglobin: 11.5 g/dL — ABNORMAL LOW (ref 12.0–15.0)
MCH: 27 pg (ref 26.0–34.0)
MCHC: 32.3 g/dL (ref 30.0–36.0)
MCV: 83.6 fL (ref 78.0–100.0)
Platelets: 257 10*3/uL (ref 150–400)
RBC: 4.26 MIL/uL (ref 3.87–5.11)
RDW: 14.9 % (ref 11.5–15.5)
WBC: 4.7 10*3/uL (ref 4.0–10.5)

## 2014-05-03 LAB — SEDIMENTATION RATE
Sed Rate: 30 mm/hr — ABNORMAL HIGH (ref 0–22)
Sed Rate: 62 mm/hr — ABNORMAL HIGH (ref 0–22)

## 2014-05-03 LAB — BASIC METABOLIC PANEL
Anion gap: 10 (ref 5–15)
BUN: 14 mg/dL (ref 6–23)
CO2: 26 meq/L (ref 19–32)
Calcium: 8.9 mg/dL (ref 8.4–10.5)
Chloride: 106 mEq/L (ref 96–112)
Creatinine, Ser: 0.83 mg/dL (ref 0.50–1.10)
GFR calc Af Amer: 78 mL/min — ABNORMAL LOW (ref >90)
GFR calc non Af Amer: 67 mL/min — ABNORMAL LOW (ref >90)
GLUCOSE: 103 mg/dL — AB (ref 70–99)
POTASSIUM: 3.8 meq/L (ref 3.7–5.3)
SODIUM: 142 meq/L (ref 137–147)

## 2014-05-03 LAB — EXPECTORATED SPUTUM ASSESSMENT W GRAM STAIN, RFLX TO RESP C

## 2014-05-03 LAB — RHEUMATOID FACTOR: Rhuematoid fact SerPl-aCnc: <10 IU/mL (ref <=14)

## 2014-05-03 LAB — STREP PNEUMONIAE URINARY ANTIGEN: Strep Pneumo Urinary Antigen: NEGATIVE

## 2014-05-03 LAB — EXPECTORATED SPUTUM ASSESSMENT W REFEX TO RESP CULTURE

## 2014-05-03 LAB — C-REACTIVE PROTEIN: CRP: 1.5 mg/dL — ABNORMAL HIGH (ref <0.60)

## 2014-05-03 LAB — HIV ANTIBODY (ROUTINE TESTING W REFLEX): HIV 1&2 Ab, 4th Generation: NONREACTIVE

## 2014-05-03 LAB — PROCALCITONIN

## 2014-05-03 MED ORDER — IOHEXOL 350 MG/ML SOLN
100.0000 mL | Freq: Once | INTRAVENOUS | Status: AC | PRN
  Administered 2014-05-03: 100 mL via INTRAVENOUS

## 2014-05-03 MED ORDER — PANTOPRAZOLE SODIUM 40 MG IV SOLR
40.0000 mg | Freq: Once | INTRAVENOUS | Status: AC
  Administered 2014-05-03: 40 mg via INTRAVENOUS
  Filled 2014-05-03: qty 40

## 2014-05-03 NOTE — Consult Note (Addendum)
Name: [name] MRN: [id number] DOB: Mar 25, 1936    ADMISSION DATE:  05/02/2014 CONSULTATION DATE:  05/03/2014  REFERRING MD :  Dr. Ree Kida PRIMARY SERVICE:  TRH  CHIEF COMPLAINT:  SOB  BRIEF PATIENT DESCRIPTION: 78 yo female who presented to ED 8/12 and was discharged with Levaquin for presumed CAP. She returned to ED 8/31 with similar complaints (SOB, productive cough) and was admitted for CAP. 9/1 SOB persists. PCCM asked to consult.   SIGNIFICANT EVENTS / STUDIES:  8/12 presented to ED, discharged with Levaquin for CAP 8/31 admitted for presumed CAP 9/1 PCCM consult for persistent SOB.  LINES / TUBES:  CULTURES: 9/1 Sputum >>> 8/31 Blood >>>  ANTIBIOTICS: Ceftriaxone 8/31 >>> Azithromycin 8/31 >>>  HISTORY OF PRESENT ILLNESS:  78 year old female with history as below, which includes CAD s/p stents 2014, HTN, ?RA, and GERD. Throughout 2015 she has been struggling with Right facial pain/numbness. She has seen several specialists for this and is still unsure about what the cause is. Throughout the process she has been on several new medications including prednisone. 8/12 she presented to ED for SOB. Although the evidence for CAP was not overwhelming the history was suspicious, so she was treated with Levaquin and discharged from ED.  Symptoms resolved for some time, but reoccurred 9/29 when she was out of the house. She became very short of breath and diaphoretic. She states that she was sweating for an hour. This was accompanied by coughing that has still not resolved. Cough is productive for thick, clear, but sometimes yellow secretions. 8/31 she presented to ED for these symptoms. Again, objective evidence not overly clear for CAP, but history lined up well, so she was treated with rocephin and azithro for resistant CAP. 9/1 SOB persists and she had one significant episode while ambulating to bathroom. PCCM asked to consult.   PAST MEDICAL HISTORY :  Past Medical History   Diagnosis Date  . Coronary artery disease   . Hypertension   . Anxiety   . Fibromyalgia   . Dysrhythmia     history of "irregular heart rate"'; pt. thinks it was A-fib  . Vertigo 08/27/2006    Archie Endo 08/27/2006 (01/27/2013)  . Ventricular hypertrophy     Archie Endo 08/27/2006 (01/27/2013)  . High cholesterol   . Anginal pain   . Exertional shortness of breath     "sometimes" (01/27/2013)  . History of stomach ulcers     "years ago" (528/2014)  . GERD (gastroesophageal reflux disease)   . Arthritis     "q where" (01/27/2013)  . Osteoporosis   . Acute myocardial infarction, unspecified site, initial episode of care   . Acute myocardial infarction of other lateral wall, initial episode of care    Past Surgical History  Procedure Laterality Date  . Hernia repair    . Laparotomy      "day after oophorectomy; had bowel obstruction" (01/27/2013)  . Coronary angioplasty with stent placement  06/2005; 01/27/2013    stent to LAD/notes 08/27/2006; "+ 1 stent today" (01/27/2013)  . Cardiac catheterization  10/2006    Archie Endo 08/03/2007 (01/27/2013)  . Tonsillectomy and adenoidectomy  ~ 1952  . Abdominal hysterectomy  1970's  . Laparoscopic salpingo oopherectomy  2000  . Diagnostic laparoscopy  2000    lap abdominal lysis of adhesions  . Breast lumpectomy Right 1970's    benign   . Breast biopsy Right 1970's   Prior to Admission medications   Medication Sig Start Date End  Date Taking? Authorizing Provider  acetaminophen (TYLENOL) 500 MG tablet Take 500 mg by mouth daily as needed (pain).   Yes Historical Provider, MD  albuterol (PROVENTIL HFA;VENTOLIN HFA) 108 (90 BASE) MCG/ACT inhaler Inhale 2 puffs into the lungs every 6 (six) hours as needed for wheezing.   Yes Historical Provider, MD  ALPRAZolam (XANAX) 0.25 MG tablet Take 0.25 mg by mouth 2 (two) times daily. anxiety   Yes Historical Provider, MD  aspirin EC 81 MG tablet Take 81 mg by mouth daily.     Yes Historical Provider, MD    clopidogrel (PLAVIX) 75 MG tablet Take 75 mg by mouth daily.   Yes Historical Provider, MD  Difluprednate (DUREZOL) 0.05 % EMUL Place 1 drop into the left eye every other day. 10 day course started 04/25/14   Yes Historical Provider, MD  furosemide (LASIX) 20 MG tablet Take 10 mg by mouth daily.    Yes Historical Provider, MD  gabapentin (NEURONTIN) 300 MG capsule Take 1 capsule (300 mg total) by mouth 3 (three) times daily. 03/24/14  Yes Antony Contras, MD  HYDROcodone-acetaminophen (NORCO/VICODIN) 5-325 MG per tablet Take 0.5-1 tablets by mouth daily as needed for moderate pain.  12/24/13  Yes Historical Provider, MD  loratadine (CLARITIN) 10 MG tablet Take 10 mg by mouth daily.    Yes Historical Provider, MD  Nepafenac (ILEVRO) 0.3 % SUSP Apply 1 drop to eye See admin instructions. Instill 1 drop in left eye daily for 3 more days (until 05/05/14), instill 1 drop in right eye daily for 10 days starting 05/01/14   Yes Historical Provider, MD  nitroGLYCERIN (NITROSTAT) 0.4 MG SL tablet Place 1 tablet (0.4 mg total) under the tongue every 5 (five) minutes as needed for chest pain (CP or SOB). 01/28/13  Yes Belva Crome III, MD  potassium chloride SA (K-DUR,KLOR-CON) 20 MEQ tablet Take 20 mEq by mouth daily.   Yes Historical Provider, MD  valsartan-hydrochlorothiazide (DIOVAN-HCT) 160-12.5 MG per tablet Take 1 tablet by mouth daily.  07/05/13  Yes Historical Provider, MD  zolpidem (AMBIEN) 10 MG tablet Take 5 mg by mouth daily as needed for sleep.    Yes Historical Provider, MD   Allergies  Allergen Reactions  . Codeine Itching    Tolerates low dose of norco    FAMILY HISTORY:  Family History  Problem Relation Age of Onset  . Other Mother   . Other Father    SOCIAL HISTORY:  reports that she quit smoking about 22 years ago. Her smoking use included Cigarettes. She has a 4.32 pack-year smoking history. She has never used smokeless tobacco. She reports that she does not drink alcohol or use illicit  drugs.  REVIEW OF SYSTEMS:   Bolds are positive  Constitutional: weight loss, gain, night sweats, Fevers, chills, fatigue .  HEENT: headaches, Sore throat, sneezing, nasal congestion, post nasal drip, Difficulty swallowing, Tooth/dental problems, visual complaints visual changes, ear ache CV:  chest pain, radiates: ,Orthopnea, PND, swelling in lower extremities(L), dizziness, palpitations, syncope.  GI  heartburn, indigestion, abdominal pain, nausea, vomiting, diarrhea, change in bowel habits, loss of appetite, bloody stools.  Resp: cough, productive: thick clear secretions , hemoptysis, dyspnea, especially with exertion, chest pain, pleuritic.  Skin: rash or itching or icterus GU: dysuria, change in color of urine, urgency or frequency. flank pain, hematuria  MS: joint pain or swelling r knee, L ankle. decreased range of motion  Psych: change in mood or affect. depression or anxiety.  Neuro: difficulty with  speech, weakness, numbness, ataxia    SUBJECTIVE:   VITAL SIGNS: Temp:  [97.9 F (36.6 C)-98.3 F (36.8 C)] 98 F (36.7 C) (09/01 0559) Pulse Rate:  [74-106] 74 (09/01 0559) Resp:  [16-24] 17 (09/01 0559) BP: (128-158)/(66-82) 150/66 mmHg (09/01 0559) SpO2:  [95 %-99 %] 95 % (09/01 0559) Weight:  [91.173 kg (201 lb)-94.167 kg (207 lb 9.6 oz)] 91.2 kg (201 lb 1 oz) (08/31 2122)  PHYSICAL EXAMINATION: General:  Overweight female in NAD Neuro:  Alert, oriented. No defecit HEENT:  [place]/AT, PERRL, no JVD noted Cardiovascular:  Tachy, regular, no MRG Lungs:  Dyspnea with minimal exertion, mild SOB during long conversation. Bilateral breath sounds clear.  Abdomen:  Soft, non-tender, non-distended. Musculoskeletal:  No acute deformity. Edema to L ankle.  Skin:  Intact, ecchymosis to injection site on abdomen.    Recent Labs Lab 05/02/14 1836 05/03/14 0452  NA 138 142  K 3.6* 3.8  CL 102 106  CO2 23 26  BUN 15 14  CREATININE 1.01 0.83  GLUCOSE 111* 103*    Recent  Labs Lab 05/02/14 1836 05/03/14 0452  HGB 12.0 11.5*  HCT 36.7 35.6*  WBC 5.6 4.7  PLT 303 257   Dg Chest 2 View  05/02/2014   CLINICAL DATA:  Shortness of breath, cough  EXAM: CHEST  2 VIEW  COMPARISON:  04/13/2014  FINDINGS: Chronic interstitial markings. Mild patchy left lower lobe opacity, best visualized on the lateral view, suspicious for pneumonia. No pleural effusion or pneumothorax.  The heart is normal in size.  Degenerative changes of the visualized thoracolumbar spine.  IMPRESSION: Mild patchy left lower lobe opacity, suspicious for pneumonia.   Electronically Signed   By: Julian Hy M.D.   On: 05/02/2014 19:59    ASSESSMENT / PLAN:  Dyspnea - Cause unclear at this point. CAP unlikely  - did not respond to Levaquin, poor radiographic evidence, poor evidence for infection PE unlikely - Does have some LLE edema left greater rt, although does not seem acute.  HAS ILD on PCXR in past and now, some smoking h/o, told has rheumatoid? unclear  Possible restrictive lung disease / ILD - ? History of RA - Supplemental O2 as needed for SpO2 goal > 92% - Continue empiric CAP coverage for now  - Trend PCT to hopefully limit antibiotic exposure - Doppler legs to r/o DVT (left leg bigger right) - CTA chest, high resolution Ct then angio for PE - Check Echo - Inpatient PFT testing - Autoimmune workup (ESR, ANCA, rheumatoid factor, C3, C4, CRP, ANA, Anti-DNI antibody) No distress, no wheezing , no role empiric steroids   Georgann Housekeeper, ACNP [facility] Pager [phone] or [phone]  I have fully examined this patient and agree with above findings.    And edited in full  Husband and pt updated in full  Lavon Paganini. Titus Mould, MD, [place] Pgr: [facility]

## 2014-05-03 NOTE — Progress Notes (Addendum)
[facility]                                                                              Patient Demographics  [name], is a 78 y.o. female, DOB - [birth year], XBJ:[id number]  Admit date - 05/02/2014   Admitting Physician Catherine Bow, DO  Outpatient Primary MD for the patient is Catherine Garnet., MD  LOS - 1   Chief Complaint  Patient presents with  . Shortness of Breath      Interim history 78 year old female with history of cough and shortness of breath for 3 weeks presented after her shortness of breath became worse in the past 2 days. Patient had come to emergency department on 04/13/2014 and was diagnosed with possible pneumonia at that time and given Levaquin.  Despite Levaquin, her symptoms persisted. Patient return to emergency department, her chest x-ray showed opacity in left lower lobe favor pneumonia.  Assessment & Plan   Community acquired pneumonia with failed outpatient treatment -Patient failed treatment with Levaquin -Will continue azithromycin and ceftriaxone -Pending urine Legionella and strep pneumoniae antigens as well as sputum and blood cultures   Progressive dyspnea -Likely secondary to patient's pneumonia -BNP 51.5 -Will consult pulmonology  Hypertension -Stable, continue ARB and HCTZ  Peripheral neuropathy -Continue gabapentin  Anxiety -Continue Xanax  History of coronary artery disease -Currently chest pain-free -Continue Plavix, aspirin  Code Status: Full  Family Communication: None at bedside  Disposition Plan: Admitted  Time Spent in minutes   30 minutes  Procedures  None  Consults   Pulmonology  DVT Prophylaxis  heparin  Lab Results  Component Value Date   PLT 257 05/03/2014    Medications  Scheduled Meds: . ALPRAZolam  0.25 mg Oral BID  . aspirin EC  81 mg Oral Daily  . azithromycin  500 mg Intravenous Q24H  . cefTRIAXone (ROCEPHIN)  IV  1 g Intravenous Q24H  . clopidogrel  75 mg Oral Daily  .  Difluprednate  1 drop Left Eye QODAY  . furosemide  10 mg Oral Daily  . gabapentin  300 mg Oral TID  . heparin  5,000 Units Subcutaneous 3 times per day  . hydrochlorothiazide  12.5 mg Oral Daily  . irbesartan  150 mg Oral Daily  . loratadine  10 mg Oral Daily  . Nepafenac  1 drop Ophthalmic See admin instructions  . potassium chloride SA  20 mEq Oral Daily   Continuous Infusions:  PRN Meds:.acetaminophen, albuterol, HYDROcodone-acetaminophen, morphine injection, nitroGLYCERIN, zolpidem  Antibiotics    Anti-infectives   Start     Dose/Rate Route Frequency Ordered Stop   05/03/14 2030  cefTRIAXone (ROCEPHIN) 1 g in dextrose 5 % 50 mL IVPB     1 g 100 mL/hr over 30 Minutes Intravenous Every 24 hours 05/02/14 2039 05/10/14 2029   05/03/14 2030  azithromycin (ZITHROMAX) 500 mg in dextrose 5 % 250 mL IVPB     500 mg 250 mL/hr over 60 Minutes Intravenous Every 24 hours 05/02/14 2039 05/10/14 2029   05/02/14 2045  cefTRIAXone (ROCEPHIN) 1 g in dextrose 5 % 50 mL IVPB  Status:  Discontinued     1 g 100 mL/hr over 30  Minutes Intravenous Every 24 hours 05/02/14 2038 05/02/14 2039   05/02/14 2045  azithromycin (ZITHROMAX) 500 mg in dextrose 5 % 250 mL IVPB  Status:  Discontinued     500 mg 250 mL/hr over 60 Minutes Intravenous Every 24 hours 05/02/14 2038 05/02/14 2039   05/02/14 2030  cefTRIAXone (ROCEPHIN) 1 g in dextrose 5 % 50 mL IVPB  Status:  Discontinued     1 g 100 mL/hr over 30 Minutes Intravenous Every 24 hours 05/02/14 2022 05/02/14 2110   05/02/14 2030  azithromycin (ZITHROMAX) 500 mg in dextrose 5 % 250 mL IVPB  Status:  Discontinued     500 mg 250 mL/hr over 60 Minutes Intravenous Every 24 hours 05/02/14 2022 05/02/14 2111        Subjective:   [name] seen and examined today.  Patient continues to complain of shortness of breath and cough. She states that she became 101 to the bathroom. He denies any chest pain dizziness or headache at this time.  Objective:    Filed Vitals:   05/02/14 2050 05/02/14 2121 05/02/14 2122 05/03/14 0559  BP:  158/80  150/66  Pulse:  87  74  Temp: 98.3 F (36.8 C) 98.1 F (36.7 C)  98 F (36.7 C)  TempSrc: Oral Oral  Oral  Resp:  18  17  Height:  5' 6.5" (1.689 m) 5' 5.35" (1.66 m)   Weight:  94.167 kg (207 lb 9.6 oz) 91.2 kg (201 lb 1 oz)   SpO2:  98%  95%    Wt Readings from Last 3 Encounters:  05/02/14 91.2 kg (201 lb 1 oz)  03/24/14 93.169 kg (205 lb 6.4 oz)  03/18/14 91.627 kg (202 lb)    No intake or output data in the 24 hours ending 05/03/14 1253  Exam  General: Well developed, well nourished, NAD, appears stated age  HEENT: NCAT, PERRLA, EOMI, Anicteic Sclera, mucous membranes moist.   Neck: Supple, no JVD, no masses  Cardiovascular: S1 S2 auscultated, no rubs, murmurs or gallops. Regular rate and rhythm.  Respiratory: Clear to auscultation bilaterally with equal chest rise  Abdomen: Soft, nontender, nondistended, + bowel sounds  Extremities: warm dry without cyanosis clubbing or edema  Neuro: AAOx3, cranial nerves grossly intact. Strength 5/5 in patient's upper and lower extremities bilaterally  Skin: Without rashes exudates or nodules  Psych: Normal affect and demeanor with intact judgement and insight  Data Review   Micro Results Recent Results (from the past 240 hour(s))  CULTURE, EXPECTORATED SPUTUM-ASSESSMENT     Status: None   Collection Time    05/03/14  7:06 AM      Result Value Ref Range Status   Specimen Description SPUTUM   Final   Special Requests NONE   Final   Sputum evaluation     Final   Value: MICROSCOPIC FINDINGS SUGGEST THAT THIS SPECIMEN IS NOT REPRESENTATIVE OF LOWER RESPIRATORY SECRETIONS. PLEASE RECOLLECT.     CALLED TO R.BUTLER,RN 05/03/14 0838 BY BSLADE   Report Status 05/03/2014 FINAL   Final    Radiology Reports Dg Chest 2 View  05/02/2014   CLINICAL DATA:  Shortness of breath, cough  EXAM: CHEST  2 VIEW  COMPARISON:  04/13/2014  FINDINGS:  Chronic interstitial markings. Mild patchy left lower lobe opacity, best visualized on the lateral view, suspicious for pneumonia. No pleural effusion or pneumothorax.  The heart is normal in size.  Degenerative changes of the visualized thoracolumbar spine.  IMPRESSION: Mild patchy left lower lobe  opacity, suspicious for pneumonia.   Electronically Signed   By: Charline Bills M.D.   On: 05/02/2014 19:59   Dg Chest 2 View  04/13/2014   CLINICAL DATA:  Cough.  EXAM: CHEST  2 VIEW  COMPARISON:  Chest radiographs performed 12/02/2013  FINDINGS: The lungs are well-aerated. Pulmonary vascularity is at the upper limits of normal. Minimal right peripheral mid lung and left basilar airspace opacities likely reflect atelectasis, though minimal pneumonia might have a similar appearance. There is no evidence of pleural effusion or pneumothorax.  The heart is normal in size; the mediastinal contour is within normal limits. No acute osseous abnormalities are seen.  IMPRESSION: Minimal right peripheral mid lung and left basilar airspace opacities likely reflect atelectasis, though minimal pneumonia might have a similar appearance.   Electronically Signed   By: Roanna Raider M.D.   On: 04/13/2014 05:34   Mr Laqueta Jean AV Contrast  04/10/2014     [facility] [address], [address] [place], [place] [address] [phone]  NEUROIMAGING REPORT   STUDY DATE: 04/06/2014 PATIENT NAME: Zaia Carre DOB: [birth year] MRN: [id number]  ORDERING CLINICIAN: Dr Pearlean Brownie CLINICAL HISTORY: 36 lady with right facial pain and numbness COMPARISON FILMS: CT Head 09/17/2009 EXAM: MRI Brain w/wo TECHNIQUE: MRI of the brain with and without contrast was obtained  utilizing 5 mm axial slices with T1, T2, T2 flair, T2 star gradient echo  and diffusion weighted views.  T1 sagittal, T2 coronal and postcontrast  views in the axial and coronal plane were obtained. CONTRAST: 20 ml multihance IMAGING SITE: [facility]  FINDINGS:   The brain parenchyma shows mild changes of chronic microvascular ischemia.  No structural lesion, tumor of infarct are noted. The paranasal sinuses  show mild chronic inflammatory changes.No abnormal lesions are seen on  diffusion-weighted views to suggest acute ischemia. The cortical sulci,  fissures and cisterns are normal in size and appearance. Lateral, third  and fourth ventricle are normal in size and appearance. No extra-axial  fluid collections are seen. No evidence of mass effect or midline shift.   No abnormal lesions are seen on post contrast views.  On sagittal views  the posterior fossa, pituitary gland and corpus callosum are unremarkable.  No evidence of intracranial hemorrhage on gradient-echo views. The orbits  and their contents, paranasal sinuses and calvarium are unremarkable.   Intracranial flow voids are present.      04/10/2014    Abnormal MRI scan of the brain showing mild changes of  chronic microvascular ischemia.    INTERPRETING PHYSICIAN:  Delia Heady, MD Certified in  Neuroimaging by American Society of Neuroimaging and [place]  [employer]    Mr Cervical Spine Wo Contrast  04/10/2014     [facility] [address] [place], [place] [address] [phone]  NEUROIMAGING REPORT   STUDY DATE:04/06/2014 PATIENT NAME: Timberly Yott DOB: 08-28-1936 MRN: [id number]  ORDERING CLINICIAN: Dr Pearlean Brownie CLINICAL HISTORY: 38 year patient with right face pain COMPARISON FILMS: none EXAM: MRI Cervical Spine TECHNIQUE: MRI of the cervical spine was obtained utilizing 3 mm sagittal  slices from the posterior fossa down to the T3-4 level with T1, T2 and  inversion recovery views. In addition 4 mm axial slices from C2-3 down to  T1-2 level were included with T2 and gradient echo views. CONTRAST: none IMAGING SITE: [facility]  FINDINGS:  This demonstrate loss of forward lordotic curvature with mild posterior  subluxation of C4 over  C3  vertebrae. Vertebral body heights and marrow  signal characteristics appear normal. The prominent spondylitic changes  noted at C4-5 but without significant compression. C5-6 shows broad-based  disc osteophyte protrusion resulting in mild canal and bilateral foraminal  narrowing. C6-7 also show similar changes stroke for milder degree. The  spinal cord parenchyma shows no abnormal signal. The visualized portion of  the thoracic spine, lower brainstem, craniovertebral junction appear  unremarkable.      04/10/2014    Abnormal MRI scan of cervical spine showing prominent  spondylitic changes from C4-C7 with mild canal stenosis at C5-6 and C6-7  and biforaminal narrowing but without significant compression.    INTERPRETING PHYSICIAN:  Delia Heady, MD Certified in  Neuroimaging by American Society of Neuroimaging and [place]  [employer]    Ct Maxillofacial W/cm  04/07/2014   CLINICAL DATA:  Right-sided facial pain  EXAM: CT MAXILLOFACIAL WITH CONTRAST  TECHNIQUE: Multidetector CT imaging of the maxillofacial structures was performed with intravenous contrast. Multiplanar CT image reconstructions were also generated. A small metallic BB was placed on the right Catherine in order to reliably differentiate right from left.  CONTRAST:  75mL OMNIPAQUE IOHEXOL 300 MG/ML  SOLN  COMPARISON:  None.  FINDINGS: Bony structures show no acute fracture. Some flattening of the mandibular condyles is noted on the right which may be the etiology of the patient's underlying discomfort. The mastoid air cells are well aerated bilaterally. The paranasal sinuses are within normal limits. The orbits and their contents are unremarkable. No skull base mass lesion is seen. The parotid and submandibular glands are unremarkable. No significant lymphadenopathy is seen. The airway is patent. The ostiomeatal complexes are patent bilaterally.  IMPRESSION: Degenerative changes of the right temporomandibular joint with  flattening of the mandibular condyle. This may be the etiology of the patient's underlying discomfort. No other focal abnormality is seen.   Electronically Signed   By: Alcide Clever M.D.   On: 04/07/2014 11:24    CBC  Recent Labs Lab 05/02/14 1836 05/03/14 0452  WBC 5.6 4.7  HGB 12.0 11.5*  HCT 36.7 35.6*  PLT 303 257  MCV 83.2 83.6  MCH 27.2 27.0  MCHC 32.7 32.3  RDW 14.7 14.9    Chemistries   Recent Labs Lab 05/02/14 1836 05/03/14 0452  NA 138 142  K 3.6* 3.8  CL 102 106  CO2 23 26  GLUCOSE 111* 103*  BUN 15 14  CREATININE 1.01 0.83  CALCIUM 9.4 8.9   ------------------------------------------------------------------------------------------------------------------ estimated creatinine clearance is 65.8 ml/min (by C-G formula based on Cr of 0.83). ------------------------------------------------------------------------------------------------------------------ No results found for this basename: HGBA1C,  in the last 72 hours ------------------------------------------------------------------------------------------------------------------ No results found for this basename: CHOL, HDL, LDLCALC, TRIG, CHOLHDL, LDLDIRECT,  in the last 72 hours ------------------------------------------------------------------------------------------------------------------ No results found for this basename: TSH, T4TOTAL, FREET3, T3FREE, THYROIDAB,  in the last 72 hours ------------------------------------------------------------------------------------------------------------------ No results found for this basename: VITAMINB12, FOLATE, FERRITIN, TIBC, IRON, RETICCTPCT,  in the last 72 hours  Coagulation profile No results found for this basename: INR, PROTIME,  in the last 168 hours  No results found for this basename: DDIMER,  in the last 72 hours  Cardiac Enzymes No results found for this basename: CK, CKMB, TROPONINI, MYOGLOBIN,  in the last 168  hours ------------------------------------------------------------------------------------------------------------------ No components found with this basename: POCBNP,     [name] D.O. on 05/03/2014 at 12:53 PM  Between 7am to 7pm - Pager - [phone]  After 7pm go  to [email or web address] - password [email or web address]  And look for the night coverage person covering for me after hours  [facility] Office  [phone]

## 2014-05-04 ENCOUNTER — Inpatient Hospital Stay (HOSPITAL_COMMUNITY): Payer: Medicare Other

## 2014-05-04 ENCOUNTER — Other Ambulatory Visit (HOSPITAL_COMMUNITY): Payer: Self-pay | Admitting: Respiratory Therapy

## 2014-05-04 DIAGNOSIS — M7989 Other specified soft tissue disorders: Secondary | ICD-10-CM

## 2014-05-04 DIAGNOSIS — I252 Old myocardial infarction: Secondary | ICD-10-CM

## 2014-05-04 DIAGNOSIS — I369 Nonrheumatic tricuspid valve disorder, unspecified: Secondary | ICD-10-CM

## 2014-05-04 DIAGNOSIS — J189 Pneumonia, unspecified organism: Secondary | ICD-10-CM

## 2014-05-04 DIAGNOSIS — R918 Other nonspecific abnormal finding of lung field: Secondary | ICD-10-CM

## 2014-05-04 LAB — LEGIONELLA ANTIGEN, URINE: LEGIONELLA ANTIGEN, URINE: NEGATIVE

## 2014-05-04 LAB — GRAM STAIN

## 2014-05-04 LAB — CBC
HCT: 36.4 % (ref 36.0–46.0)
Hemoglobin: 11.6 g/dL — ABNORMAL LOW (ref 12.0–15.0)
MCH: 27.1 pg (ref 26.0–34.0)
MCHC: 31.9 g/dL (ref 30.0–36.0)
MCV: 85 fL (ref 78.0–100.0)
Platelets: 270 10*3/uL (ref 150–400)
RBC: 4.28 MIL/uL (ref 3.87–5.11)
RDW: 15 % (ref 11.5–15.5)
WBC: 5.1 10*3/uL (ref 4.0–10.5)

## 2014-05-04 LAB — PULMONARY FUNCTION TEST
DL/VA % PRED: 78 %
DL/VA: 3.86 ml/min/mmHg/L
DLCO COR: 11.06 ml/min/mmHg
DLCO cor % pred: 43 %
DLCO unc % pred: 40 %
DLCO unc: 10.39 ml/min/mmHg
FEF 25-75 Post: 2.55 L/sec
FEF 25-75 Pre: 2.29 L/sec
FEF2575-%CHANGE-POST: 11 %
FEF2575-%Pred-Post: 165 %
FEF2575-%Pred-Pre: 148 %
FEV1-%CHANGE-POST: 6 %
FEV1-%PRED-POST: 113 %
FEV1-%PRED-PRE: 106 %
FEV1-Post: 2 L
FEV1-Pre: 1.88 L
FEV1FVC-%Change-Post: 0 %
FEV1FVC-%PRED-PRE: 109 %
FEV6-%Change-Post: 6 %
FEV6-%Pred-Post: 110 %
FEV6-%Pred-Pre: 102 %
FEV6-Post: 2.41 L
FEV6-Pre: 2.26 L
FEV6FVC-%Change-Post: 0 %
FEV6FVC-%PRED-PRE: 103 %
FEV6FVC-%Pred-Post: 104 %
FVC-%Change-Post: 6 %
FVC-%Pred-Post: 105 %
FVC-%Pred-Pre: 99 %
FVC-Post: 2.41 L
FVC-Pre: 2.26 L
POST FEV1/FVC RATIO: 83 %
PRE FEV6/FVC RATIO: 100 %
Post FEV6/FVC ratio: 100 %
Pre FEV1/FVC ratio: 83 %
RV % pred: 70 %
RV: 1.66 L
TLC % pred: 74 %
TLC: 3.9 L

## 2014-05-04 LAB — BASIC METABOLIC PANEL
Anion gap: 11 (ref 5–15)
BUN: 12 mg/dL (ref 6–23)
CO2: 25 mEq/L (ref 19–32)
CREATININE: 0.83 mg/dL (ref 0.50–1.10)
Calcium: 8.8 mg/dL (ref 8.4–10.5)
Chloride: 102 mEq/L (ref 96–112)
GFR, EST AFRICAN AMERICAN: 78 mL/min — AB (ref >90)
GFR, EST NON AFRICAN AMERICAN: 67 mL/min — AB (ref >90)
Glucose, Bld: 104 mg/dL — ABNORMAL HIGH (ref 70–99)
POTASSIUM: 4.2 meq/L (ref 3.7–5.3)
Sodium: 138 mEq/L (ref 137–147)

## 2014-05-04 LAB — ANA: ANA: NEGATIVE

## 2014-05-04 LAB — C4 COMPLEMENT: COMPLEMENT C4, BODY FLUID: 48 mg/dL — AB (ref 10–40)

## 2014-05-04 LAB — ANCA SCREEN W REFLEX TITER
ATYPICAL P-ANCA SCREEN: NEGATIVE
c-ANCA Screen: NEGATIVE
p-ANCA Screen: NEGATIVE

## 2014-05-04 LAB — C3 COMPLEMENT: C3 COMPLEMENT: 163 mg/dL (ref 90–180)

## 2014-05-04 LAB — ANTI-DNA ANTIBODY, DOUBLE-STRANDED: ds DNA Ab: <1 IU/mL

## 2014-05-04 MED ORDER — ALBUTEROL SULFATE (2.5 MG/3ML) 0.083% IN NEBU
2.5000 mg | INHALATION_SOLUTION | Freq: Once | RESPIRATORY_TRACT | Status: AC
  Administered 2014-05-04: 2.5 mg via RESPIRATORY_TRACT

## 2014-05-04 NOTE — Progress Notes (Signed)
[facility] PROGRESS NOTE  [name] ZOX:[id number] DOB: 1936/07/05 DOA: 05/02/2014 PCP: [name]., MD  Assessment/Plan: 78 y/o female with PMH of HTN, CAD s/p PTCA stent, with recent pneumonia is admitted with SOB, cough, DOE     1. Suspected atypical PNA, productive cough;  failed outpatient treatment; CT: no PE -Patient failed treatment with Levaquin; continue azithromycin and ceftriaxone  -negative: urine Legionella and strep pneumoniae; blood culture NGTD; pend sputum   2. DOE; unclear etiology'; Likely secondary to pneumonia  -pend PFTs, ? ILD: echo; ANA, RA neg; pend CCP' ; obtain scleroderma, anca, quantiferron;  pulmonology following  3. Hypertension  -Stable, continue ARB and HCTZ  4. Peripheral neuropathy  -Continue gabapentin  5. Anxiety Continue Xanax  6. History of coronary artery disease Currently chest pain-free  -Continue Plavix, aspirin   Code Status: full Family Communication:  D/w patient, family at the bedside (indicate person spoken with, relationship, and if by phone, the number) Disposition Plan: home pend clinical imrpovement    Consultants:  Pulmonology   Procedures:    Antibiotics:   ceftriaxon e9/1<<<  Azithromycin 9/1<<<<  (indicate start date, and stop date if known)  HPI/Subjective: alert  Objective: Filed Vitals:   05/04/14 0646  BP: 129/74  Pulse: 84  Temp: 98.2 F (36.8 C)  Resp: 18    Intake/Output Summary (Last 24 hours) at 05/04/14 1219 Last data filed at 05/03/14 2238  Gross per 24 hour  Intake    420 ml  Output    550 ml  Net   -130 ml   Filed Weights   05/02/14 1832 05/02/14 2121 05/02/14 2122  Weight: 91.173 kg (201 lb) 94.167 kg (207 lb 9.6 oz) 91.2 kg (201 lb 1 oz)    Exam:   General:  alert  Cardiovascular: s1,s2 rrr  Respiratory: CTA BL  Abdomen: soft, nt,nd   Musculoskeletal: no LE edeam   Data Reviewed: Basic Metabolic Panel:  Recent Labs Lab 05/02/14 1836  05/03/14 0452 05/04/14 0340  NA 138 142 138  K 3.6* 3.8 4.2  CL 102 106 102  CO2 GLUCOSE 111* 103* 104*  BUN CREATININE 1.01 0.83 0.83  CALCIUM 9.4 8.9 8.8   Liver Function Tests: No results found for this basename: AST, ALT, ALKPHOS, BILITOT, PROT, ALBUMIN,  in the last 168 hours No results found for this basename: LIPASE, AMYLASE,  in the last 168 hours No results found for this basename: AMMONIA,  in the last 168 hours CBC:  Recent Labs Lab 05/02/14 1836 05/03/14 0452 05/04/14 0340  WBC 5.6 4.7 5.1  HGB 12.0 11.5* 11.6*  HCT 36.7 35.6* 36.4  MCV 83.2 83.6 85.0  PLT 303 257 270   Cardiac Enzymes: No results found for this basename: CKTOTAL, CKMB, CKMBINDEX, TROPONINI,  in the last 168 hours BNP (last 3 results)  Recent Labs  08/22/13 1203 05/02/14 1921  PROBNP 93.0 51.5   CBG: No results found for this basename: GLUCAP,  in the last 168 hours  Recent Results (from the past 240 hour(s))  CULTURE, BLOOD (ROUTINE X 2)     Status: None   Collection Time    05/02/14  8:31 PM      Result Value Ref Range Status   Specimen Description BLOOD ARM RIGHT   Final   Special Requests BOTTLES DRAWN AEROBIC AND ANAEROBIC 5CC EACH   Final   Culture  Setup Time     Final  Value: 05/03/2014 00:25     Performed at [facility]   Culture     Final   Value:        BLOOD CULTURE RECEIVED NO GROWTH TO DATE CULTURE WILL BE HELD FOR 5 DAYS BEFORE ISSUING A FINAL NEGATIVE REPORT     Performed at [facility]   Report Status PENDING   Incomplete  CULTURE, BLOOD (ROUTINE X 2)     Status: None   Collection Time    05/02/14  8:36 PM      Result Value Ref Range Status   Specimen Description BLOOD HAND RIGHT   Final   Special Requests BOTTLES DRAWN AEROBIC AND ANAEROBIC 5CC EACH   Final   Culture  Setup Time     Final   Value: 05/03/2014 00:25     Performed at [facility]   Culture     Final   Value:        BLOOD CULTURE RECEIVED NO  GROWTH TO DATE CULTURE WILL BE HELD FOR 5 DAYS BEFORE ISSUING A FINAL NEGATIVE REPORT     Performed at [facility]   Report Status PENDING   Incomplete  CULTURE, EXPECTORATED SPUTUM-ASSESSMENT     Status: None   Collection Time    05/03/14  7:06 AM      Result Value Ref Range Status   Specimen Description SPUTUM   Final   Special Requests NONE   Final   Sputum evaluation     Final   Value: MICROSCOPIC FINDINGS SUGGEST THAT THIS SPECIMEN IS NOT REPRESENTATIVE OF LOWER RESPIRATORY SECRETIONS. PLEASE RECOLLECT.     CALLED TO R.BUTLER,RN 05/03/14 0838 BY BSLADE   Report Status 05/03/2014 FINAL   Final     Studies: Dg Chest 2 View  05/02/2014   CLINICAL DATA:  Shortness of breath, cough  EXAM: CHEST  2 VIEW  COMPARISON:  04/13/2014  FINDINGS: Chronic interstitial markings. Mild patchy left lower lobe opacity, best visualized on the lateral view, suspicious for pneumonia. No pleural effusion or pneumothorax.  The heart is normal in size.  Degenerative changes of the visualized thoracolumbar spine.  IMPRESSION: Mild patchy left lower lobe opacity, suspicious for pneumonia.   Electronically Signed   By: Charline Bills M.D.   On: 05/02/2014 19:59   Ct Chest Wo Contrast  05/04/2014   CLINICAL DATA:  Shortness of breath, cough.  EXAM: CT CHEST WITHOUT CONTRAST  TECHNIQUE: Multidetector CT imaging of the chest was performed following the standard protocol without IV contrast.  COMPARISON:  02/25/2008.  FINDINGS: Mediastinal lymph nodes are not enlarged by CT size criteria. Hilar regions are difficult to definitively evaluate without IV contrast appear grossly unremarkable. No axillary adenopathy. Atherosclerotic calcification of the arterial vasculature, including three-vessel involvement the coronary arteries. Heart is enlarged. No pericardial effusion.  Image quality is degraded by respiratory motion. There is fairly diffuse ground-glass which may be due to expiratory phase imaging. An accessory  fissure in the left upper lobe is noted. No definite subpleural reticulation, traction bronchiectasis/bronchiolectasis, architectural distortion or honeycombing. Scarring in the lingula. No definite air trapping. No pleural fluid. Airway is otherwise unremarkable.  Incidental imaging of the upper abdomen shows the visualized portions of the liver, gallbladder, adrenal glands, kidneys, spleen, pancreas and stomach to be grossly unremarkable. No upper abdominal adenopathy. No worrisome lytic or sclerotic lesions. Degenerative changes are seen in the spine.  IMPRESSION: 1. Image quality is rather degraded by respiratory motion and expiratory phase  imaging. No definitive evidence of fibrotic interstitial lung disease. 2. Fairly diffuse pulmonary parenchymal ground-glass may be due to expiratory phase imaging. Difficult to exclude an acute inflammatory process or pneumonia. 3. Three-vessel coronary artery calcification.   Electronically Signed   By: Leanna Battles M.D.   On: 05/04/2014 07:18   Ct Angio Chest Pe W/cm &/or Wo Cm  05/03/2014   CLINICAL DATA:  assess PE, pls do HIGH resolution first then contrast angio  EXAM: CT ANGIOGRAPHY CHEST WITH CONTRAST  TECHNIQUE: Multidetector CT imaging of the chest was performed using the standard protocol during bolus administration of intravenous contrast. Multiplanar CT image reconstructions and MIPs were obtained to evaluate the vascular anatomy.  CONTRAST:  OMNIPAQUE IOHEXOL 350 MG/ML SOLN  COMPARISON:  02/25/2008  FINDINGS: Satisfactory opacification of pulmonary arteries noted, and there is no evidence of pulmonary emboli. Early contrast opacification of the thoracic aorta with no evidence of dissection, aneurysm, or stenosis. There is classic 3-vessel brachiocephalic arch anatomy without proximal stenosis. There is moderate partially calcified plaque in the aortic arch and descending segment. There are subcentimeter bilateral hilar, prevascular, AP window,  pretracheal, and precarinal lymph nodes. Patchy ground-glass opacities in the posterior aspect of all lobes. Multiple mild spondylitic changes in the lower cervical and lower thoracic spine. Sternum intact. Small hiatal hernia. Visualized portions of upper abdomen otherwise unremarkable.  Review of the MIP images confirms the above findings.  IMPRESSION: 1. Negative for acute PE or thoracic aortic dissection. 2. Atherosclerosis, including aortic and coronary artery disease. Please note that although the presence of coronary artery calcium documents the presence of coronary artery disease, the severity of this disease and any potential stenosis cannot be assessed on this non-gated CT examination. Assessment for potential risk factor modification, dietary therapy or pharmacologic therapy may be warranted, if clinically indicated. 3. Pulmonary ground-glass opacities in the dependent aspect of all lobes bilaterally. This will be assessed more completely on concurrent high-resolution CT, to be reported separately.   Electronically Signed   By: Oley Balm M.D.   On: 05/03/2014 18:39    Scheduled Meds: . ALPRAZolam  0.25 mg Oral BID  . aspirin EC  81 mg Oral Daily  . azithromycin  500 mg Intravenous Q24H  . cefTRIAXone (ROCEPHIN)  IV  1 g Intravenous Q24H  . clopidogrel  75 mg Oral Daily  . Difluprednate  1 drop Left Eye QODAY  . furosemide  10 mg Oral Daily  . gabapentin  300 mg Oral TID  . heparin  5,000 Units Subcutaneous 3 times per day  . hydrochlorothiazide  12.5 mg Oral Daily  . irbesartan  150 mg Oral Daily  . loratadine  10 mg Oral Daily  . Nepafenac  1 drop Ophthalmic See admin instructions  . potassium chloride SA  20 mEq Oral Daily   Continuous Infusions:   Active Problems:   CAP (community acquired pneumonia)   Pulmonary infiltrates    Time spent: >35 minutes    Esperanza Sheets  [facility] Pager [phone]. If 7PM-7AM, please contact night-coverage at [email or web address],  password [facility] 05/04/2014, 12:19 PM  LOS: 2 days

## 2014-05-04 NOTE — Progress Notes (Signed)
*  Preliminary Results* Bilateral lower extremity venous duplex completed. Bilateral lower extremities are negative for deep vein thrombosis. There is no evidence of Baker's cyst bilaterally.  05/04/2014  Jose Alleyne, RVT, RDCS, RDMS  

## 2014-05-04 NOTE — Progress Notes (Signed)
  Echocardiogram 2D Echocardiogram has been performed.  Janalyn Harder 05/04/2014, 2:09 PM

## 2014-05-04 NOTE — Consult Note (Signed)
Name: [name] MRN: [id number] DOB: [birth year]    ADMISSION DATE:  05/02/2014 CONSULTATION DATE:  05/03/2014  REFERRING MD :  Dr. Catha Gosselin PRIMARY SERVICE:  TRH  CHIEF COMPLAINT:  SOB  BRIEF PATIENT DESCRIPTION: 78 yo female who presented to ED 8/12 and was discharged with Levaquin for presumed CAP. She returned to ED 8/31 with similar complaints (SOB, productive cough) and was admitted for CAP. 9/1 SOB persists. PCCM asked to consult.   SIGNIFICANT EVENTS / STUDIES:  8/12 presented to ED, discharged with Levaquin for CAP 8/31 admitted for presumed CAP 9/1 PCCM consult for persistent SOB.  LINES / TUBES:  CULTURES: 9/1 Sputum >>> 8/31 Blood >>>  ANTIBIOTICS: Ceftriaxone 8/31 >>> Azithromycin 8/31 >>>   SUBJECTIVE:  For PFT's today.   VITAL SIGNS: Temp:  [98.2 F (36.8 C)-98.4 F (36.9 C)] 98.2 F (36.8 C) (09/02 0646) Pulse Rate:  [83-88] 84 (09/02 0646) Resp:  [18-19] 18 (09/02 0646) BP: (120-142)/(72-81) 129/74 mmHg (09/02 0646) SpO2:  [94 %-97 %] 96 % (09/02 0646)  PHYSICAL EXAMINATION: General:  Overweight female in NAD Neuro:  Alert, oriented. No defecit HEENT:  [place]/AT, PERRL, no JVD noted Cardiovascular:  Tachy, regular, no MRG Lungs:  Dyspnea with minimal exertion, mild SOB during long conversation. Bilateral breath sounds clear.  Abdomen:  Soft, non-tender, non-distended. Musculoskeletal:  No acute deformity. Edema to L ankle.  Skin:  Intact, ecchymosis to injection site on abdomen.    Recent Labs Lab 05/02/14 1836 05/03/14 0452 05/04/14 0340  NA 138 142 138  K 3.6* 3.8 4.2  CL 102 106 102  CO2 BUN CREATININE 1.01 0.83 0.83  GLUCOSE 111* 103* 104*    Recent Labs Lab 05/02/14 1836 05/03/14 0452 05/04/14 0340  HGB 12.0 11.5* 11.6*  HCT 36.7 35.6* 36.4  WBC 5.6 4.7 5.1  PLT 303 257 270   Dg Chest 2 View  05/02/2014   CLINICAL DATA:  Shortness of breath, cough  EXAM: CHEST  2 VIEW  COMPARISON:  04/13/2014   FINDINGS: Chronic interstitial markings. Mild patchy left lower lobe opacity, best visualized on the lateral view, suspicious for pneumonia. No pleural effusion or pneumothorax.  The heart is normal in size.  Degenerative changes of the visualized thoracolumbar spine.  IMPRESSION: Mild patchy left lower lobe opacity, suspicious for pneumonia.   Electronically Signed   By: Charline Bills M.D.   On: 05/02/2014 19:59   Ct Chest Wo Contrast  05/04/2014   CLINICAL DATA:  Shortness of breath, cough.  EXAM: CT CHEST WITHOUT CONTRAST  TECHNIQUE: Multidetector CT imaging of the chest was performed following the standard protocol without IV contrast.  COMPARISON:  02/25/2008.  FINDINGS: Mediastinal lymph nodes are not enlarged by CT size criteria. Hilar regions are difficult to definitively evaluate without IV contrast appear grossly unremarkable. No axillary adenopathy. Atherosclerotic calcification of the arterial vasculature, including three-vessel involvement the coronary arteries. Heart is enlarged. No pericardial effusion.  Image quality is degraded by respiratory motion. There is fairly diffuse ground-glass which may be due to expiratory phase imaging. An accessory fissure in the left upper lobe is noted. No definite subpleural reticulation, traction bronchiectasis/bronchiolectasis, architectural distortion or honeycombing. Scarring in the lingula. No definite air trapping. No pleural fluid. Airway is otherwise unremarkable.  Incidental imaging of the upper abdomen shows the visualized portions of the liver, gallbladder, adrenal glands, kidneys, spleen, pancreas and stomach to be grossly unremarkable. No upper abdominal adenopathy. No worrisome  lytic or sclerotic lesions. Degenerative changes are seen in the spine.  IMPRESSION: 1. Image quality is rather degraded by respiratory motion and expiratory phase imaging. No definitive evidence of fibrotic interstitial lung disease. 2. Fairly diffuse pulmonary parenchymal  ground-glass may be due to expiratory phase imaging. Difficult to exclude an acute inflammatory process or pneumonia. 3. Three-vessel coronary artery calcification.   Electronically Signed   By: Leanna Battles M.D.   On: 05/04/2014 07:18   Ct Angio Chest Pe W/cm &/or Wo Cm  05/03/2014   CLINICAL DATA:  assess PE, pls do HIGH resolution first then contrast angio  EXAM: CT ANGIOGRAPHY CHEST WITH CONTRAST  TECHNIQUE: Multidetector CT imaging of the chest was performed using the standard protocol during bolus administration of intravenous contrast. Multiplanar CT image reconstructions and MIPs were obtained to evaluate the vascular anatomy.  CONTRAST:  OMNIPAQUE IOHEXOL 350 MG/ML SOLN  COMPARISON:  02/25/2008  FINDINGS: Satisfactory opacification of pulmonary arteries noted, and there is no evidence of pulmonary emboli. Early contrast opacification of the thoracic aorta with no evidence of dissection, aneurysm, or stenosis. There is classic 3-vessel brachiocephalic arch anatomy without proximal stenosis. There is moderate partially calcified plaque in the aortic arch and descending segment. There are subcentimeter bilateral hilar, prevascular, AP window, pretracheal, and precarinal lymph nodes. Patchy ground-glass opacities in the posterior aspect of all lobes. Multiple mild spondylitic changes in the lower cervical and lower thoracic spine. Sternum intact. Small hiatal hernia. Visualized portions of upper abdomen otherwise unremarkable.  Review of the MIP images confirms the above findings.  IMPRESSION: 1. Negative for acute PE or thoracic aortic dissection. 2. Atherosclerosis, including aortic and coronary artery disease. Please note that although the presence of coronary artery calcium documents the presence of coronary artery disease, the severity of this disease and any potential stenosis cannot be assessed on this non-gated CT examination. Assessment for potential risk factor modification, dietary therapy  or pharmacologic therapy may be warranted, if clinically indicated. 3. Pulmonary ground-glass opacities in the dependent aspect of all lobes bilaterally. This will be assessed more completely on concurrent high-resolution CT, to be reported separately.   Electronically Signed   By: Oley Balm M.D.   On: 05/03/2014 18:39    ASSESSMENT / PLAN:  Dyspnea - Cause unclear at this point. CT chest - suspect ILD.   CAP unlikely  - did not respond to Levaquin, poor radiographic evidence, poor evidence for infection PE unlikely - CTA neg PE    Possible restrictive lung disease / ILD - ? History of RA - Supplemental O2 as needed for SpO2 goal > 92% - Continue empiric CAP coverage for now  - Trend PCT to hopefully limit antibiotic exposure - Doppler legs to r/o DVT (left leg bigger right) - Check Echo - pending  - Inpatient PFT testing - pending  - CCP in am  - Autoimmune workup - RA and ANA neg, CRP slight elevated     Dirk Dress, NP 05/04/2014  10:33 AM Pager: [phone] or [phone]    STAFF NOTE: I, Dr Lavinia Sharps have personally reviewed patient's available data, including medical history, events of note, physical examination and test results as part of my evaluation. I have discussed with resident/NP and other care providers such as pharmacist, RN and RRT.  In addition,  I personally evaluated patient and elicited key findings of  Possible ILD. Her movement and so fare negative autoimmune profile has made it very difficult to figure out if  she has ILD or not. Certainly if she has low DLCO (her obesity will guarantee a restriction) I will get another CT chest HRCT prone and supine with specicif instruction to stay still. And if stil suggests ILD and if autoimmune all come back negative we have to proceed with surgical lung bx (could be done as outpatient too). For now, wait for more data as above   Rest per NP/medical resident whose note is outlined above and that I agree  with     Dr. Kalman Shan, M.D., [facility]C.P Pulmonary and Critical Care Medicine Staff Physician [facility] [place] [facility] Critical Care Pager: [phone], If no answer or between  15:00h - 7:00h: call [phone]  05/04/2014 11:03 AM

## 2014-05-05 ENCOUNTER — Inpatient Hospital Stay (HOSPITAL_COMMUNITY): Payer: Medicare Other

## 2014-05-05 DIAGNOSIS — R0600 Dyspnea, unspecified: Secondary | ICD-10-CM

## 2014-05-05 DIAGNOSIS — I10 Essential (primary) hypertension: Secondary | ICD-10-CM

## 2014-05-05 DIAGNOSIS — R059 Cough, unspecified: Secondary | ICD-10-CM

## 2014-05-05 DIAGNOSIS — R0989 Other specified symptoms and signs involving the circulatory and respiratory systems: Secondary | ICD-10-CM

## 2014-05-05 DIAGNOSIS — R05 Cough: Secondary | ICD-10-CM

## 2014-05-05 DIAGNOSIS — R0609 Other forms of dyspnea: Secondary | ICD-10-CM

## 2014-05-05 LAB — ANTI-SCLERODERMA ANTIBODY: Scleroderma (Scl-70) (ENA) Antibody, IgG: <1

## 2014-05-05 LAB — MPO/PR-3 (ANCA) ANTIBODIES: Serine Protease 3: <1

## 2014-05-05 LAB — PROCALCITONIN

## 2014-05-05 NOTE — Care Management Note (Signed)
  Page 1 of 1   05/05/2014     11:06:30 AM CARE MANAGEMENT NOTE 05/05/2014  Patient:  [name], [name]   Account Number:  [id number]  Date Initiated:  05/05/2014  Documentation initiated by:  Ronny Flurry  Subjective/Objective Assessment:     Action/Plan:   Anticipated DC Date:     Anticipated DC Plan:  HOME/SELF CARE         Choice offered to / List presented to:             Status of service:   Medicare Important Message given?  YES (If response is "NO", the following Medicare IM given date fields will be blank) Date Medicare IM given:  05/05/2014 Medicare IM given by:  Ronny Flurry Date Additional Medicare IM given:   Additional Medicare IM given by:    Discharge Disposition:    Per UR Regulation:    If discussed at Long Length of Stay Meetings, dates discussed:    Comments:

## 2014-05-05 NOTE — Progress Notes (Signed)
Patient walked to the hallway with regular pace and saturating between 95-98% on room air.

## 2014-05-05 NOTE — Progress Notes (Signed)
[facility] PROGRESS NOTE  [name] KGM:[id number] DOB: [birth year] DOA: 05/02/2014 PCP: [name]., MD  Assessment/Plan: 78 y/o female with PMH of HTN, CAD s/p PTCA stent, with recent pneumonia is admitted with SOB, cough, DOE     1. Suspected atypical PNA, productive cough;  failed outpatient treatment; CT: no PE -Patient failed treatment with Levaquin; continue azithromycin and ceftriaxone  -negative: urine Legionella and strep pneumoniae; blood culture NGTD; pend sputum   2. DOE; unclear etiology'; Likely secondary to pneumonia vs ILD  - ANA, RA neg; pend CCP' ; pulmonology following; repeat CT chest pend  3. Hypertension  -Stable, continue ARB and HCTZ  4. Peripheral neuropathy  -Continue gabapentin  5. Anxiety Continue Xanax  6. History of coronary artery disease Currently chest pain-free  -Continue Plavix, aspirin   Code Status: full Family Communication:  D/w patient, family at the bedside (indicate person spoken with, relationship, and if by phone, the number) Disposition Plan: home pend clinical imrpovement    Consultants:  Pulmonology   Procedures:    Antibiotics:   ceftriaxon e9/1<<<  Azithromycin 9/1<<<<  (indicate start date, and stop date if known)  HPI/Subjective: alert  Objective: Filed Vitals:   05/05/14 1308  BP: 138/68  Pulse: 81  Temp: 98 F (36.7 C)  Resp: 17    Intake/Output Summary (Last 24 hours) at 05/05/14 1320 Last data filed at 05/05/14 1308  Gross per 24 hour  Intake   1182 ml  Output      0 ml  Net   1182 ml   Filed Weights   05/02/14 1832 05/02/14 2121 05/02/14 2122  Weight: 91.173 kg (201 lb) 94.167 kg (207 lb 9.6 oz) 91.2 kg (201 lb 1 oz)    Exam:   General:  alert  Cardiovascular: s1,s2 rrr  Respiratory: CTA BL  Abdomen: soft, nt,nd   Musculoskeletal: no LE edeam   Data Reviewed: Basic Metabolic Panel:  Recent Labs Lab 05/02/14 1836 05/03/14 0452 05/04/14 0340  NA  138 142 138  K 3.6* 3.8 4.2  CL 102 106 102  CO2 GLUCOSE 111* 103* 104*  BUN CREATININE 1.01 0.83 0.83  CALCIUM 9.4 8.9 8.8   Liver Function Tests: No results found for this basename: AST, ALT, ALKPHOS, BILITOT, PROT, ALBUMIN,  in the last 168 hours No results found for this basename: LIPASE, AMYLASE,  in the last 168 hours No results found for this basename: AMMONIA,  in the last 168 hours CBC:  Recent Labs Lab 05/02/14 1836 05/03/14 0452 05/04/14 0340  WBC 5.6 4.7 5.1  HGB 12.0 11.5* 11.6*  HCT 36.7 35.6* 36.4  MCV 83.2 83.6 85.0  PLT 303 257 270   Cardiac Enzymes: No results found for this basename: CKTOTAL, CKMB, CKMBINDEX, TROPONINI,  in the last 168 hours BNP (last 3 results)  Recent Labs  08/22/13 1203 05/02/14 1921  PROBNP 93.0 51.5   CBG: No results found for this basename: GLUCAP,  in the last 168 hours  Recent Results (from the past 240 hour(s))  CULTURE, BLOOD (ROUTINE X 2)     Status: None   Collection Time    05/02/14  8:31 PM      Result Value Ref Range Status   Specimen Description BLOOD ARM RIGHT   Final   Special Requests BOTTLES DRAWN AEROBIC AND ANAEROBIC [facility] EACH   Final   Culture  Setup Time     Final   Value: 05/03/2014  00:25     Performed at [facility]     Final   Value:        BLOOD CULTURE RECEIVED NO GROWTH TO DATE CULTURE WILL BE HELD FOR 5 DAYS BEFORE ISSUING A FINAL NEGATIVE REPORT     Performed at [facility]   Report Status PENDING   Incomplete  CULTURE, BLOOD (ROUTINE X 2)     Status: None   Collection Time    05/02/14  8:36 PM      Result Value Ref Range Status   Specimen Description BLOOD HAND RIGHT   Final   Special Requests BOTTLES DRAWN AEROBIC AND ANAEROBIC [facility] EACH   Final   Culture  Setup Time     Final   Value: 05/03/2014 00:25     Performed at [facility]   Culture     Final   Value: GRAM POSITIVE RODS     Note: Gram Stain Report Called to,Read Back By  and Verified With: Anselmo Pickler ON 05/04/2014 AT 11:48P BY WILEJ     Performed at [facility]   Report Status PENDING   Incomplete  CULTURE, EXPECTORATED SPUTUM-ASSESSMENT     Status: None   Collection Time    05/03/14  7:06 AM      Result Value Ref Range Status   Specimen Description SPUTUM   Final   Special Requests NONE   Final   Sputum evaluation     Final   Value: MICROSCOPIC FINDINGS SUGGEST THAT THIS SPECIMEN IS NOT REPRESENTATIVE OF LOWER RESPIRATORY SECRETIONS. PLEASE RECOLLECT.     CALLED TO R.BUTLER,RN 05/03/14 0838 BY BSLADE   Report Status 05/03/2014 FINAL   Final  GRAM STAIN     Status: None   Collection Time    05/04/14  1:21 PM      Result Value Ref Range Status   Specimen Description TRACHEAL SITE   Final   Special Requests NONE   Final   Gram Stain     Final   Value: MICROSCOPIC FINDINGS SUGGEST THAT THIS SPECIMEN IS NOT REPRESENTATIVE OF LOWER RESPIRATORY SECRETIONS. PLEASE RECOLLECT.     Derald Macleod RN [phone] 05/04/14 A BROWNING   Report Status 05/04/2014 FINAL   Final     Studies: Ct Chest Wo Contrast  05/04/2014   CLINICAL DATA:  Shortness of breath, cough.  EXAM: CT CHEST WITHOUT CONTRAST  TECHNIQUE: Multidetector CT imaging of the chest was performed following the standard protocol without IV contrast.  COMPARISON:  02/25/2008.  FINDINGS: Mediastinal lymph nodes are not enlarged by CT size criteria. Hilar regions are difficult to definitively evaluate without IV contrast appear grossly unremarkable. No axillary adenopathy. Atherosclerotic calcification of the arterial vasculature, including three-vessel involvement the coronary arteries. Heart is enlarged. No pericardial effusion.  Image quality is degraded by respiratory motion. There is fairly diffuse ground-glass which may be due to expiratory phase imaging. An accessory fissure in the left upper lobe is noted. No definite subpleural reticulation, traction bronchiectasis/bronchiolectasis, architectural  distortion or honeycombing. Scarring in the lingula. No definite air trapping. No pleural fluid. Airway is otherwise unremarkable.  Incidental imaging of the upper abdomen shows the visualized portions of the liver, gallbladder, adrenal glands, kidneys, spleen, pancreas and stomach to be grossly unremarkable. No upper abdominal adenopathy. No worrisome lytic or sclerotic lesions. Degenerative changes are seen in the spine.  IMPRESSION: 1. Image quality is rather degraded by respiratory motion and expiratory phase imaging. No  definitive evidence of fibrotic interstitial lung disease. 2. Fairly diffuse pulmonary parenchymal ground-glass may be due to expiratory phase imaging. Difficult to exclude an acute inflammatory process or pneumonia. 3. Three-vessel coronary artery calcification.   Electronically Signed   By: Leanna Battles M.D.   On: 05/04/2014 07:18   Ct Angio Chest Pe W/cm &/or Wo Cm  05/03/2014   CLINICAL DATA:  assess PE, pls do HIGH resolution first then contrast angio  EXAM: CT ANGIOGRAPHY CHEST WITH CONTRAST  TECHNIQUE: Multidetector CT imaging of the chest was performed using the standard protocol during bolus administration of intravenous contrast. Multiplanar CT image reconstructions and MIPs were obtained to evaluate the vascular anatomy.  CONTRAST:  OMNIPAQUE IOHEXOL 350 MG/ML SOLN  COMPARISON:  02/25/2008  FINDINGS: Satisfactory opacification of pulmonary arteries noted, and there is no evidence of pulmonary emboli. Early contrast opacification of the thoracic aorta with no evidence of dissection, aneurysm, or stenosis. There is classic 3-vessel brachiocephalic arch anatomy without proximal stenosis. There is moderate partially calcified plaque in the aortic arch and descending segment. There are subcentimeter bilateral hilar, prevascular, AP window, pretracheal, and precarinal lymph nodes. Patchy ground-glass opacities in the posterior aspect of all lobes. Multiple mild spondylitic  changes in the lower cervical and lower thoracic spine. Sternum intact. Small hiatal hernia. Visualized portions of upper abdomen otherwise unremarkable.  Review of the MIP images confirms the above findings.  IMPRESSION: 1. Negative for acute PE or thoracic aortic dissection. 2. Atherosclerosis, including aortic and coronary artery disease. Please note that although the presence of coronary artery calcium documents the presence of coronary artery disease, the severity of this disease and any potential stenosis cannot be assessed on this non-gated CT examination. Assessment for potential risk factor modification, dietary therapy or pharmacologic therapy may be warranted, if clinically indicated. 3. Pulmonary ground-glass opacities in the dependent aspect of all lobes bilaterally. This will be assessed more completely on concurrent high-resolution CT, to be reported separately.   Electronically Signed   By: Oley Balm M.D.   On: 05/03/2014 18:39    Scheduled Meds: . ALPRAZolam  0.25 mg Oral BID  . aspirin EC  81 mg Oral Daily  . azithromycin  500 mg Intravenous Q24H  . cefTRIAXone (ROCEPHIN)  IV  1 g Intravenous Q24H  . clopidogrel  75 mg Oral Daily  . Difluprednate  1 drop Left Eye QODAY  . furosemide  10 mg Oral Daily  . gabapentin  300 mg Oral TID  . heparin  5,000 Units Subcutaneous 3 times per day  . hydrochlorothiazide  12.5 mg Oral Daily  . irbesartan  150 mg Oral Daily  . loratadine  10 mg Oral Daily  . Nepafenac  1 drop Ophthalmic See admin instructions  . potassium chloride SA  20 mEq Oral Daily   Continuous Infusions:   Active Problems:   CAP (community acquired pneumonia)   Pulmonary infiltrates   Dyspnea   Cough    Time spent: >35 minutes    Esperanza Sheets  [facility] Pager [phone]. If 7PM-7AM, please contact night-coverage at [email or web address], password [facility] 05/05/2014, 1:20 PM  LOS: 3 days

## 2014-05-05 NOTE — Progress Notes (Signed)
Name: [name] MRN: [id number] DOB: 19-Oct-1935    ADMISSION DATE:  05/02/2014 CONSULTATION DATE:  05/03/2014  REFERRING MD :  Dr. Catha Gosselin PRIMARY SERVICE:  TRH  CHIEF COMPLAINT:  SOB  BRIEF PATIENT DESCRIPTION: 78 yo female who presented to ED 8/12 and was discharged with Levaquin for presumed CAP. She returned to ED 8/31 with similar complaints (SOB, productive cough) and was admitted for CAP. 9/1 SOB persists. PCCM asked to consult.   SIGNIFICANT EVENTS / STUDIES:  8/12 presented to ED, discharged with Levaquin for CAP 8/31 admitted for presumed CAP 9/1 PCCM consult for persistent SOB.  LINES / TUBES:  CULTURES: 9/1 Sputum >>> 8/31 Blood >>>  ANTIBIOTICS: Ceftriaxone 8/31 >>> Azithromycin 8/31 >>>   SUBJECTIVE:    Still with cough  VITAL SIGNS: Temp:  [97.7 F (36.5 C)-98.3 F (36.8 C)] 97.7 F (36.5 C) (09/03 0500) Pulse Rate:  [79-88] 79 (09/03 0500) Resp:  [16-18] 16 (09/03 0500) BP: (116-146)/(66-76) 134/75 mmHg (09/03 0500) SpO2:  [96 %-100 %] 100 % (09/03 0500)  PHYSICAL EXAMINATION: General:  Overweight female in NAD Neuro:  Alert, oriented. No defecit HEENT:  [place]/AT, PERRL, no JVD noted Cardiovascular:  Tachy, regular, no MRG Lungs:  Dyspnea with minimal exertion, mild SOB during long conversation. Bilateral basal fine crackles +  Abdomen:  Soft, non-tender, non-distended. Musculoskeletal:  No acute deformity. Edema to L ankle.  Skin:  Intact, ecchymosis to injection site on abdomen.   PULMONARY No results found for this basename: PHART, PCO2, PCO2ART, PO2, PO2ART, HCO3, TCO2, O2SAT,  in the last 168 hours  CBC  Recent Labs Lab 05/02/14 1836 05/03/14 0452 05/04/14 0340  HGB 12.0 11.5* 11.6*  HCT 36.7 35.6* 36.4  WBC 5.6 4.7 5.1  PLT 303 257 270    COAGULATION No results found for this basename: INR,  in the last 168 hours  CARDIAC  No results found for this basename: TROPONINI,  in the last 168 hours  Recent Labs Lab  05/02/14 1921  PROBNP 51.5     CHEMISTRY  Recent Labs Lab 05/02/14 1836 05/03/14 0452 05/04/14 0340  NA 138 142 138  K 3.6* 3.8 4.2  CL 102 106 102  CO2 GLUCOSE 111* 103* 104*  BUN CREATININE 1.01 0.83 0.83  CALCIUM 9.4 8.9 8.8   Estimated Creatinine Clearance: 65.8 ml/min (by C-G formula based on Cr of 0.83).   LIVER No results found for this basename: AST, ALT, ALKPHOS, BILITOT, PROT, ALBUMIN, INR,  in the last 168 hours   INFECTIOUS  Recent Labs Lab 05/03/14 1500 05/05/14 0546  PROCALCITON <0.10 <0.10     ENDOCRINE CBG (last 3)  No results found for this basename: GLUCAP,  in the last 72 hours       IMAGING x48h Ct Chest Wo Contrast  05/04/2014   CLINICAL DATA:  Shortness of breath, cough.  EXAM: CT CHEST WITHOUT CONTRAST  TECHNIQUE: Multidetector CT imaging of the chest was performed following the standard protocol without IV contrast.  COMPARISON:  02/25/2008.  FINDINGS: Mediastinal lymph nodes are not enlarged by CT size criteria. Hilar regions are difficult to definitively evaluate without IV contrast appear grossly unremarkable. No axillary adenopathy. Atherosclerotic calcification of the arterial vasculature, including three-vessel involvement the coronary arteries. Heart is enlarged. No pericardial effusion.  Image quality is degraded by respiratory motion. There is fairly diffuse ground-glass which may be due to expiratory phase imaging. An accessory fissure in the left  upper lobe is noted. No definite subpleural reticulation, traction bronchiectasis/bronchiolectasis, architectural distortion or honeycombing. Scarring in the lingula. No definite air trapping. No pleural fluid. Airway is otherwise unremarkable.  Incidental imaging of the upper abdomen shows the visualized portions of the liver, gallbladder, adrenal glands, kidneys, spleen, pancreas and stomach to be grossly unremarkable. No upper abdominal adenopathy. No worrisome lytic  or sclerotic lesions. Degenerative changes are seen in the spine.  IMPRESSION: 1. Image quality is rather degraded by respiratory motion and expiratory phase imaging. No definitive evidence of fibrotic interstitial lung disease. 2. Fairly diffuse pulmonary parenchymal ground-glass may be due to expiratory phase imaging. Difficult to exclude an acute inflammatory process or pneumonia. 3. Three-vessel coronary artery calcification.   Electronically Signed   By: Leanna Battles M.D.   On: 05/04/2014 07:18   Ct Angio Chest Pe W/cm &/or Wo Cm  05/03/2014   CLINICAL DATA:  assess PE, pls do HIGH resolution first then contrast angio  EXAM: CT ANGIOGRAPHY CHEST WITH CONTRAST  TECHNIQUE: Multidetector CT imaging of the chest was performed using the standard protocol during bolus administration of intravenous contrast. Multiplanar CT image reconstructions and MIPs were obtained to evaluate the vascular anatomy.  CONTRAST:  OMNIPAQUE IOHEXOL 350 MG/ML SOLN  COMPARISON:  02/25/2008  FINDINGS: Satisfactory opacification of pulmonary arteries noted, and there is no evidence of pulmonary emboli. Early contrast opacification of the thoracic aorta with no evidence of dissection, aneurysm, or stenosis. There is classic 3-vessel brachiocephalic arch anatomy without proximal stenosis. There is moderate partially calcified plaque in the aortic arch and descending segment. There are subcentimeter bilateral hilar, prevascular, AP window, pretracheal, and precarinal lymph nodes. Patchy ground-glass opacities in the posterior aspect of all lobes. Multiple mild spondylitic changes in the lower cervical and lower thoracic spine. Sternum intact. Small hiatal hernia. Visualized portions of upper abdomen otherwise unremarkable.  Review of the MIP images confirms the above findings.  IMPRESSION: 1. Negative for acute PE or thoracic aortic dissection. 2. Atherosclerosis, including aortic and coronary artery disease. Please note that  although the presence of coronary artery calcium documents the presence of coronary artery disease, the severity of this disease and any potential stenosis cannot be assessed on this non-gated CT examination. Assessment for potential risk factor modification, dietary therapy or pharmacologic therapy may be warranted, if clinically indicated. 3. Pulmonary ground-glass opacities in the dependent aspect of all lobes bilaterally. This will be assessed more completely on concurrent high-resolution CT, to be reported separately.   Electronically Signed   By: Oley Balm M.D.   On: 05/03/2014 18:39       ASSESSMENT / PLAN:  Dyspnea /Cough   - PE/dVT  ruled out  - sepsis ruled out with normal PCT  - Crackles point to ILD in setting of normal BNP and normal LVEF    - ILD uncertain due to motion artifact with HRCT though clnical suspicion remains  - If ILD - autoimmune unlikely - so far negative and CCP   PLAN  - walking desat test   - repeat HRCT with prone and supine images and have instructed her not to move - clinical suspicion is high       Dr. Kalman Shan, M.D., [facility].C.P Pulmonary and Critical Care Medicine Staff Physician [facility] [place] [facility] Pager: [phone], If no answer or between  15:00h - 7:00h: call [phone]  05/05/2014 11:13 AM

## 2014-05-06 LAB — QUANTIFERON TB GOLD ASSAY (BLOOD)
Interferon Gamma Release Assay: NEGATIVE
Mitogen value: >10 IU/mL
QUANTIFERON NIL VALUE: 0.11 [IU]/mL
TB ANTIGEN MINUS NIL VALUE: 0 [IU]/mL
TB Ag value: 0.11 IU/mL

## 2014-05-06 LAB — CULTURE, BLOOD (ROUTINE X 2)

## 2014-05-06 LAB — ANCA SCREEN W REFLEX TITER
Atypical p-ANCA Screen: NEGATIVE
C-ANCA SCREEN: NEGATIVE
p-ANCA Screen: NEGATIVE

## 2014-05-06 MED ORDER — FLUTICASONE PROPIONATE 50 MCG/ACT NA SUSP
2.0000 | Freq: Every day | NASAL | Status: DC
  Administered 2014-05-06: 2 via NASAL
  Filled 2014-05-06 (×2): qty 16

## 2014-05-06 MED ORDER — DOXYCYCLINE HYCLATE 100 MG PO TABS
100.0000 mg | ORAL_TABLET | Freq: Two times a day (BID) | ORAL | Status: DC
  Administered 2014-05-06: 100 mg via ORAL
  Filled 2014-05-06: qty 1

## 2014-05-06 MED ORDER — PANTOPRAZOLE SODIUM 40 MG PO TBEC: 40.0000 mg | DELAYED_RELEASE_TABLET | Freq: Every day | ORAL | Status: DC

## 2014-05-06 MED ORDER — PREDNISONE 20 MG PO TABS: 40.0000 mg | ORAL_TABLET | Freq: Every day | ORAL | Status: DC

## 2014-05-06 MED ORDER — DOXYCYCLINE HYCLATE 100 MG PO TABS: 100.0000 mg | ORAL_TABLET | Freq: Two times a day (BID) | ORAL | Status: DC

## 2014-05-06 MED ORDER — FLUTICASONE PROPIONATE 50 MCG/ACT NA SUSP: 2.0000 | Freq: Every day | NASAL | Status: DC

## 2014-05-06 MED ORDER — PANTOPRAZOLE SODIUM 40 MG PO TBEC
40.0000 mg | DELAYED_RELEASE_TABLET | Freq: Every day | ORAL | Status: DC
  Administered 2014-05-06: 40 mg via ORAL

## 2014-05-06 MED ORDER — PREDNISONE 10 MG PO TABS: 10.0000 mg | ORAL_TABLET | Freq: Every day | ORAL | Status: DC

## 2014-05-06 MED ORDER — HYDROCODONE-ACETAMINOPHEN 5-325 MG PO TABS: 0.5000 | ORAL_TABLET | Freq: Every day | ORAL | Status: DC | PRN

## 2014-05-06 NOTE — Discharge Instructions (Signed)
Please follow up with pulmonologist on 05/23/14

## 2014-05-06 NOTE — Discharge Summary (Signed)
Physician Discharge Summary  [name] ZOX:[id number] DOB: Jan 19, 1936 DOA: 05/02/2014  PCP: [name]., MD  Admit date: 05/02/2014 Discharge date: 05/06/2014  Time spent: >35 minutes  Recommendations for Outpatient Follow-up:  F/u with pulmonology 9/21  Discharge Diagnoses:  Active Problems:   CAP (community acquired pneumonia)   Pulmonary infiltrates   Dyspnea   Cough   Discharge Condition: stable  Diet recommendation: low sodium   Filed Weights   05/02/14 1832 05/02/14 2121 05/02/14 2122  Weight: 91.173 kg (201 lb) 94.167 kg (207 lb 9.6 oz) 91.2 kg (201 lb 1 oz)    History of present illness:  78 y/o female with PMH of HTN, CAD s/p PTCA stent, with recent pneumonia is admitted with SOB, cough, DOE    Hospital Course:  1. probable atypical PNA vs ILD; CT: no PE  -Patient failed treatment with Levaquin; negative: urine Legionella and strep pneumoniae; blood culture NGTD; -symptoms improved   2. DOE; unclear etiology'; Likely secondary to ILD  -pulmonology work up underway; recommended to taper steroids+doxy, and outpatient follow up on 9/21;  ANA, RA neg; pend CCP' ; ? Need lung biopsy as outpatient   3. Hypertension  -Stable, continue ARB and HCTZ  4. Peripheral neuropathy  -Continue gabapentin  5. Anxiety Continue Xanax  6. History of coronary artery disease Currently chest pain-free  -Continue Plavix, aspirin     Procedures:  PFTs (i.e. Studies not automatically included, echos, thoracentesis, etc; not x-rays)  Consultations:  Pulmonology   Discharge Exam: Filed Vitals:   05/06/14 0529  BP: 136/69  Pulse: 74  Temp: 97.2 F (36.2 C)  Resp: 16    General: alert Cardiovascular: s1,s2 rrr Respiratory: CTA BL  Discharge Instructions  Discharge Instructions   Diet - low sodium heart healthy    Complete by:  As directed      Discharge instructions    Complete by:  As directed   Please follow up with pulmonologist on 05/23/14      Increase activity slowly    Complete by:  As directed             Medication List         acetaminophen 500 MG tablet  Commonly known as:  TYLENOL  Take 500 mg by mouth daily as needed (pain).     albuterol 108 (90 BASE) MCG/ACT inhaler  Commonly known as:  PROVENTIL HFA;VENTOLIN HFA  Inhale 2 puffs into the lungs every 6 (six) hours as needed for wheezing.     ALPRAZolam 0.25 MG tablet  Commonly known as:  XANAX  Take 0.25 mg by mouth 2 (two) times daily. anxiety     aspirin EC 81 MG tablet  Take 81 mg by mouth daily.     clopidogrel 75 MG tablet  Commonly known as:  PLAVIX  Take 75 mg by mouth daily.     doxycycline 100 MG tablet  Commonly known as:  VIBRA-TABS  Take 1 tablet (100 mg total) by mouth every 12 (twelve) hours.     DUREZOL 0.05 % Emul  Generic drug:  Difluprednate  Place 1 drop into the left eye every other day. 10 day course started 04/25/14     fluticasone 50 MCG/ACT nasal spray  Commonly known as:  FLONASE  Place 2 sprays into both nostrils daily.     furosemide 20 MG tablet  Commonly known as:  LASIX  Take 10 mg by mouth daily.     gabapentin 300 MG capsule  Commonly known as:  NEURONTIN  Take 1 capsule (300 mg total) by mouth 3 (three) times daily.     HYDROcodone-acetaminophen 5-325 MG per tablet  Commonly known as:  NORCO/VICODIN  Take 0.5-1 tablets by mouth daily as needed for moderate pain.     ILEVRO 0.3 % Susp  Generic drug:  Nepafenac  Apply 1 drop to eye See admin instructions. Instill 1 drop in left eye daily for 3 more days (until 05/05/14), instill 1 drop in right eye daily for 10 days starting 05/01/14     loratadine 10 MG tablet  Commonly known as:  CLARITIN  Take 10 mg by mouth daily.     nitroGLYCERIN 0.4 MG SL tablet  Commonly known as:  NITROSTAT  Place 1 tablet (0.4 mg total) under the tongue every 5 (five) minutes as needed for chest pain (CP or SOB).     pantoprazole 40 MG tablet  Commonly known as:  PROTONIX   Take 1 tablet (40 mg total) by mouth daily at 12 noon.     potassium chloride SA 20 MEQ tablet  Commonly known as:  K-DUR,KLOR-CON  Take 20 mEq by mouth daily.     predniSONE 10 MG tablet  Commonly known as:  DELTASONE  Take 1 tablet (10 mg total) by mouth daily with breakfast.     valsartan-hydrochlorothiazide 160-12.5 MG per tablet  Commonly known as:  DIOVAN-HCT  Take 1 tablet by mouth daily.     zolpidem 10 MG tablet  Commonly known as:  AMBIEN  Take 5 mg by mouth daily as needed for sleep.       Allergies  Allergen Reactions  . Codeine Itching    Tolerates low dose of norco       Follow-up Information   Follow up with [facility], MD. Schedule an appointment as soon as possible for a visit on 05/23/2014.   Specialty:  Pulmonary Disease   Contact information:   [address] [place] [address] [phone]        The results of significant diagnostics from this hospitalization (including imaging, microbiology, ancillary and laboratory) are listed below for reference.    Significant Diagnostic Studies: Dg Chest 2 View  05/02/2014   CLINICAL DATA:  Shortness of breath, cough  EXAM: CHEST  2 VIEW  COMPARISON:  04/13/2014  FINDINGS: Chronic interstitial markings. Mild patchy left lower lobe opacity, best visualized on the lateral view, suspicious for pneumonia. No pleural effusion or pneumothorax.  The heart is normal in size.  Degenerative changes of the visualized thoracolumbar spine.  IMPRESSION: Mild patchy left lower lobe opacity, suspicious for pneumonia.   Electronically Signed   By: Charline Bills M.D.   On: 05/02/2014 19:59   Dg Chest 2 View  04/13/2014   CLINICAL DATA:  Cough.  EXAM: CHEST  2 VIEW  COMPARISON:  Chest radiographs performed 12/02/2013  FINDINGS: The lungs are well-aerated. Pulmonary vascularity is at the upper limits of normal. Minimal right peripheral mid lung and left basilar airspace opacities likely reflect atelectasis, though  minimal pneumonia might have a similar appearance. There is no evidence of pleural effusion or pneumothorax.  The heart is normal in size; the mediastinal contour is within normal limits. No acute osseous abnormalities are seen.  IMPRESSION: Minimal right peripheral mid lung and left basilar airspace opacities likely reflect atelectasis, though minimal pneumonia might have a similar appearance.   Electronically Signed   By: Roanna Raider M.D.   On: 04/13/2014 05:34  Ct Chest Wo Contrast  05/04/2014   CLINICAL DATA:  Shortness of breath, cough.  EXAM: CT CHEST WITHOUT CONTRAST  TECHNIQUE: Multidetector CT imaging of the chest was performed following the standard protocol without IV contrast.  COMPARISON:  02/25/2008.  FINDINGS: Mediastinal lymph nodes are not enlarged by CT size criteria. Hilar regions are difficult to definitively evaluate without IV contrast appear grossly unremarkable. No axillary adenopathy. Atherosclerotic calcification of the arterial vasculature, including three-vessel involvement the coronary arteries. Heart is enlarged. No pericardial effusion.  Image quality is degraded by respiratory motion. There is fairly diffuse ground-glass which may be due to expiratory phase imaging. An accessory fissure in the left upper lobe is noted. No definite subpleural reticulation, traction bronchiectasis/bronchiolectasis, architectural distortion or honeycombing. Scarring in the lingula. No definite air trapping. No pleural fluid. Airway is otherwise unremarkable.  Incidental imaging of the upper abdomen shows the visualized portions of the liver, gallbladder, adrenal glands, kidneys, spleen, pancreas and stomach to be grossly unremarkable. No upper abdominal adenopathy. No worrisome lytic or sclerotic lesions. Degenerative changes are seen in the spine.  IMPRESSION: 1. Image quality is rather degraded by respiratory motion and expiratory phase imaging. No definitive evidence of fibrotic interstitial  lung disease. 2. Fairly diffuse pulmonary parenchymal ground-glass may be due to expiratory phase imaging. Difficult to exclude an acute inflammatory process or pneumonia. 3. Three-vessel coronary artery calcification.   Electronically Signed   By: Leanna Battles M.D.   On: 05/04/2014 07:18   Ct Angio Chest Pe W/cm &/or Wo Cm  05/03/2014   CLINICAL DATA:  assess PE, pls do HIGH resolution first then contrast angio  EXAM: CT ANGIOGRAPHY CHEST WITH CONTRAST  TECHNIQUE: Multidetector CT imaging of the chest was performed using the standard protocol during bolus administration of intravenous contrast. Multiplanar CT image reconstructions and MIPs were obtained to evaluate the vascular anatomy.  CONTRAST:  OMNIPAQUE IOHEXOL 350 MG/ML SOLN  COMPARISON:  02/25/2008  FINDINGS: Satisfactory opacification of pulmonary arteries noted, and there is no evidence of pulmonary emboli. Early contrast opacification of the thoracic aorta with no evidence of dissection, aneurysm, or stenosis. There is classic 3-vessel brachiocephalic arch anatomy without proximal stenosis. There is moderate partially calcified plaque in the aortic arch and descending segment. There are subcentimeter bilateral hilar, prevascular, AP window, pretracheal, and precarinal lymph nodes. Patchy ground-glass opacities in the posterior aspect of all lobes. Multiple mild spondylitic changes in the lower cervical and lower thoracic spine. Sternum intact. Small hiatal hernia. Visualized portions of upper abdomen otherwise unremarkable.  Review of the MIP images confirms the above findings.  IMPRESSION: 1. Negative for acute PE or thoracic aortic dissection. 2. Atherosclerosis, including aortic and coronary artery disease. Please note that although the presence of coronary artery calcium documents the presence of coronary artery disease, the severity of this disease and any potential stenosis cannot be assessed on this non-gated CT examination. Assessment  for potential risk factor modification, dietary therapy or pharmacologic therapy may be warranted, if clinically indicated. 3. Pulmonary ground-glass opacities in the dependent aspect of all lobes bilaterally. This will be assessed more completely on concurrent high-resolution CT, to be reported separately.   Electronically Signed   By: Oley Balm M.D.   On: 05/03/2014 18:39   Mr Laqueta Jean ZO Contrast  04/10/2014     [facility] [address] [place], [place] [address] [phone]  NEUROIMAGING REPORT   STUDY DATE: 04/06/2014 PATIENT NAME: Dailah Opperman DOB: 12/16/1935 MRN:  [id number]  ORDERING CLINICIAN: Dr Pearlean Brownie CLINICAL HISTORY: 7 lady with right facial pain and numbness COMPARISON FILMS: CT Head 09/17/2009 EXAM: MRI Brain w/wo TECHNIQUE: MRI of the brain with and without contrast was obtained  utilizing 5 mm axial slices with T1, T2, T2 flair, T2 star gradient echo  and diffusion weighted views.  T1 sagittal, T2 coronal and postcontrast  views in the axial and coronal plane were obtained. CONTRAST: 20 ml multihance IMAGING SITE: [facility]  FINDINGS:  The brain parenchyma shows mild changes of chronic microvascular ischemia.  No structural lesion, tumor of infarct are noted. The paranasal sinuses  show mild chronic inflammatory changes.No abnormal lesions are seen on  diffusion-weighted views to suggest acute ischemia. The cortical sulci,  fissures and cisterns are normal in size and appearance. Lateral, third  and fourth ventricle are normal in size and appearance. No extra-axial  fluid collections are seen. No evidence of mass effect or midline shift.   No abnormal lesions are seen on post contrast views.  On sagittal views  the posterior fossa, pituitary gland and corpus callosum are unremarkable.  No evidence of intracranial hemorrhage on gradient-echo views. The orbits  and their contents, paranasal sinuses and calvarium are unremarkable.   Intracranial flow  voids are present.      04/10/2014    Abnormal MRI scan of the brain showing mild changes of  chronic microvascular ischemia.    INTERPRETING PHYSICIAN:  Delia Heady, MD Certified in  Neuroimaging by American Society of Neuroimaging and [place]  [employer]    Mr Cervical Spine Wo Contrast  04/10/2014     [facility] [address], [address] [place], [place] [address] [phone]  NEUROIMAGING REPORT   STUDY DATE:04/06/2014 PATIENT NAME: Ave Scharnhorst DOB: 13-Sep-1935 MRN: [id number]  ORDERING CLINICIAN: Dr Pearlean Brownie CLINICAL HISTORY: 23 year patient with right face pain COMPARISON FILMS: none EXAM: MRI Cervical Spine TECHNIQUE: MRI of the cervical spine was obtained utilizing 3 mm sagittal  slices from the posterior fossa down to the T3-4 level with T1, T2 and  inversion recovery views. In addition 4 mm axial slices from C2-3 down to  T1-2 level were included with T2 and gradient echo views. CONTRAST: none IMAGING SITE: [facility]  FINDINGS:  This demonstrate loss of forward lordotic curvature with mild posterior  subluxation of C4 over C3 vertebrae. Vertebral body heights and marrow  signal characteristics appear normal. The prominent spondylitic changes  noted at C4-5 but without significant compression. C5-6 shows broad-based  disc osteophyte protrusion resulting in mild canal and bilateral foraminal  narrowing. C6-7 also show similar changes stroke for milder degree. The  spinal cord parenchyma shows no abnormal signal. The visualized portion of  the thoracic spine, lower brainstem, craniovertebral junction appear  unremarkable.      04/10/2014    Abnormal MRI scan of cervical spine showing prominent  spondylitic changes from C4-C7 with mild canal stenosis at C5-6 and C6-7  and biforaminal narrowing but without significant compression.    INTERPRETING PHYSICIAN:  Delia Heady, MD Certified in  Neuroimaging by American Society of Neuroimaging and [place]   [employer]    Ct Chest High Resolution  05/06/2014   CLINICAL DATA:  Cough and shortness of breath. Abnormal breath sounds. Evaluate for interstitial lung disease.  EXAM: CT CHEST WITHOUT CONTRAST  TECHNIQUE: Multidetector CT imaging of the chest was performed following the standard protocol without intravenous contrast. High resolution imaging of the lungs,  as well as inspiratory and expiratory imaging, was performed.  COMPARISON:  05/03/2014, 02/25/2008 and 11/02/2004.  FINDINGS: No pathologically enlarged mediastinal or axillary lymph nodes. Hilar regions are difficult to definitively evaluate without IV contrast but appear grossly unremarkable. Atherosclerotic calcification of the arterial vasculature, including three-vessel involvement of the coronary arteries. Heart is enlarged. No pericardial effusion.  Image quality is degraded by respiratory motion and largely expiratory phase imaging. Therefore, evaluation of scattered increased densities in the lungs is limited and may simply relate to expiratory phase imaging. Of note, inspiratory phase imaging in the prone position largely shows clearing of the ground-glass seen on conventional supine imaging. There may be very mild scattered subpleural reticulation bilaterally (prone series, images 15 and 20). No definite traction bronchiectasis/bronchiolectasis, architectural distortion or honeycombing. No pleural fluid. Airway is otherwise unremarkable.  Incidental imaging of the upper abdomen shows the visualized portions of the liver, right adrenal gland, spleen and stomach to be grossly unremarkable. No worrisome lytic or sclerotic lesions. Degenerative changes are seen in the spine.  IMPRESSION: 1. Image quality is degraded by respiratory motion and expiratory phase imaging. However, inspiratory prone imaging suggests very mild subpleural reticulation at the lung bases, and therefore very mild subpleural fibrosis is considered. 2.  Three-vessel coronary artery calcification.   Electronically Signed   By: Leanna Battles M.D.   On: 05/06/2014 08:36   Ct Maxillofacial W/cm  04/07/2014   CLINICAL DATA:  Right-sided facial pain  EXAM: CT MAXILLOFACIAL WITH CONTRAST  TECHNIQUE: Multidetector CT imaging of the maxillofacial structures was performed with intravenous contrast. Multiplanar CT image reconstructions were also generated. A small metallic BB was placed on the right temple in order to reliably differentiate right from left.  CONTRAST:  75mL OMNIPAQUE IOHEXOL 300 MG/ML  SOLN  COMPARISON:  None.  FINDINGS: Bony structures show no acute fracture. Some flattening of the mandibular condyles is noted on the right which may be the etiology of the patient's underlying discomfort. The mastoid air cells are well aerated bilaterally. The paranasal sinuses are within normal limits. The orbits and their contents are unremarkable. No skull base mass lesion is seen. The parotid and submandibular glands are unremarkable. No significant lymphadenopathy is seen. The airway is patent. The ostiomeatal complexes are patent bilaterally.  IMPRESSION: Degenerative changes of the right temporomandibular joint with flattening of the mandibular condyle. This may be the etiology of the patient's underlying discomfort. No other focal abnormality is seen.   Electronically Signed   By: Alcide Clever M.D.   On: 04/07/2014 11:24    Microbiology: Recent Results (from the past 240 hour(s))  CULTURE, BLOOD (ROUTINE X 2)     Status: None   Collection Time    05/02/14  8:31 PM      Result Value Ref Range Status   Specimen Description BLOOD ARM RIGHT   Final   Special Requests BOTTLES DRAWN AEROBIC AND ANAEROBIC 5CC EACH   Final   Culture  Setup Time     Final   Value: 05/03/2014 00:25     Performed at [facility]   Culture     Final   Value:        BLOOD CULTURE RECEIVED NO GROWTH TO DATE CULTURE WILL BE HELD FOR 5 DAYS BEFORE ISSUING A FINAL NEGATIVE  REPORT     Performed at [facility]   Report Status PENDING   Incomplete  CULTURE, BLOOD (ROUTINE X 2)     Status: None   Collection  Time    05/02/14  8:36 PM      Result Value Ref Range Status   Specimen Description BLOOD HAND RIGHT   Final   Special Requests BOTTLES DRAWN AEROBIC AND ANAEROBIC [facility] EACH   Final   Culture  Setup Time     Final   Value: 05/03/2014 00:25     Performed at [facility]   Culture     Final   Value: GRAM POSITIVE RODS     Note: Gram Stain Report Called to,Read Back By and Verified With: Anselmo Pickler ON 05/04/2014 AT 11:48P BY WILEJ     Performed at [facility]   Report Status PENDING   Incomplete  CULTURE, EXPECTORATED SPUTUM-ASSESSMENT     Status: None   Collection Time    05/03/14  7:06 AM      Result Value Ref Range Status   Specimen Description SPUTUM   Final   Special Requests NONE   Final   Sputum evaluation     Final   Value: MICROSCOPIC FINDINGS SUGGEST THAT THIS SPECIMEN IS NOT REPRESENTATIVE OF LOWER RESPIRATORY SECRETIONS. PLEASE RECOLLECT.     CALLED TO R.BUTLER,RN 05/03/14 0838 BY BSLADE   Report Status 05/03/2014 FINAL   Final  GRAM STAIN     Status: None   Collection Time    05/04/14  1:21 PM      Result Value Ref Range Status   Specimen Description TRACHEAL SITE   Final   Special Requests NONE   Final   Gram Stain     Final   Value: MICROSCOPIC FINDINGS SUGGEST THAT THIS SPECIMEN IS NOT REPRESENTATIVE OF LOWER RESPIRATORY SECRETIONS. PLEASE RECOLLECT.     NOTIFIED D THOMPSON RN 1659 05/04/14 A BROWNING   Report Status 05/04/2014 FINAL   Final     Labs: Basic Metabolic Panel:  Recent Labs Lab 05/02/14 1836 05/03/14 0452 05/04/14 0340  NA 138 142 138  K 3.6* 3.8 4.2  CL 102 106 102  CO2 GLUCOSE 111* 103* 104*  BUN CREATININE 1.01 0.83 0.83  CALCIUM 9.4 8.9 8.8   Liver Function Tests: No results found for this basename: AST, ALT, ALKPHOS, BILITOT, PROT, ALBUMIN,  in the last 168  hours No results found for this basename: LIPASE, AMYLASE,  in the last 168 hours No results found for this basename: AMMONIA,  in the last 168 hours CBC:  Recent Labs Lab 05/02/14 1836 05/03/14 0452 05/04/14 0340  WBC 5.6 4.7 5.1  HGB 12.0 11.5* 11.6*  HCT 36.7 35.6* 36.4  MCV 83.2 83.6 85.0  PLT 303 257 270   Cardiac Enzymes: No results found for this basename: CKTOTAL, CKMB, CKMBINDEX, TROPONINI,  in the last 168 hours BNP: BNP (last 3 results)  Recent Labs  08/22/13 1203 05/02/14 1921  PROBNP 93.0 51.5   CBG: No results found for this basename: GLUCAP,  in the last 168 hours     Signed:  Esperanza Sheets  [facility] 05/06/2014, 12:11 PM

## 2014-05-06 NOTE — Progress Notes (Signed)
Name: [name] MRN: [id number] DOB: Oct 04, 1935    ADMISSION DATE:  05/02/2014 CONSULTATION DATE:  05/03/2014  REFERRING MD :  Dr. Catha Gosselin PRIMARY SERVICE:  TRH  CHIEF COMPLAINT:  SOB  BRIEF PATIENT DESCRIPTION: 78 yo female who presented to ED 8/12 and was discharged with Levaquin for presumed CAP. She returned to ED 8/31 with similar complaints (SOB, productive cough) and was admitted for CAP. 9/1 SOB persists. PCCM asked to consult.   SIGNIFICANT EVENTS / STUDIES:  8/12 presented to ED, discharged with Levaquin for CAP 8/31 admitted for presumed CAP 9/1 PCCM consult for persistent SOB.  LINES / TUBES:  CULTURES: 9/1 Sputum >>> 8/31 Blood >>>  ANTIBIOTICS: Ceftriaxone 8/31 >>> Azithromycin 8/31 >>>   SUBJECTIVE:    05/06/14: Still with cough though feeling better after nebs given. Walked yesterday long distance in hallway and  Did not desaturate. Repeat CT chest yesterday - again movement artifact but on prone images possible sub-pleural reticulation. She reports that prior antibiotic and few weeks of prednisone did not help her. Again poor historian - not sure if she took prednisone 4 weeks ago or for 4 weeks.   VITAL SIGNS: Temp:  [97.2 F (36.2 C)-98 F (36.7 C)] 97.2 F (36.2 C) (09/04 0529) Pulse Rate:  [74-84] 74 (09/04 0529) Resp:  [16-17] 16 (09/04 0529) BP: (136-157)/(68-77) 136/69 mmHg (09/04 0529) SpO2:  [96 %-99 %] 99 % (09/04 0529)  PHYSICAL EXAMINATION: General:  Overweight female in NAD Neuro:  Alert, oriented. No defecit HEENT:  [place]/AT, PERRL, no JVD noted Cardiovascular:  Tachy, regular, no MRG Lungs:  Dyspnea with minimal exertion, mild SOB during long conversation. Bilateral basal fine crackles + - present yesterday - not sure today - she did get neb Rx  Abdomen:  Soft, non-tender, non-distended. Musculoskeletal:  No acute deformity. Edema to L ankle.  Skin:  Intact, ecchymosis to injection site on abdomen.   PULMONARY No results  found for this basename: PHART, PCO2, PCO2ART, PO2, PO2ART, HCO3, TCO2, O2SAT,  in the last 168 hours  CBC  Recent Labs Lab 05/02/14 1836 05/03/14 0452 05/04/14 0340  HGB 12.0 11.5* 11.6*  HCT 36.7 35.6* 36.4  WBC 5.6 4.7 5.1  PLT 303 257 270    COAGULATION No results found for this basename: INR,  in the last 168 hours  CARDIAC  No results found for this basename: TROPONINI,  in the last 168 hours  Recent Labs Lab 05/02/14 1921  PROBNP 51.5     CHEMISTRY  Recent Labs Lab 05/02/14 1836 05/03/14 0452 05/04/14 0340  NA 138 142 138  K 3.6* 3.8 4.2  CL 102 106 102  CO2 GLUCOSE 111* 103* 104*  BUN CREATININE 1.01 0.83 0.83  CALCIUM 9.4 8.9 8.8   Estimated Creatinine Clearance: 65.8 ml/min (by C-G formula based on Cr of 0.83).   LIVER No results found for this basename: AST, ALT, ALKPHOS, BILITOT, PROT, ALBUMIN, INR,  in the last 168 hours   INFECTIOUS  Recent Labs Lab 05/03/14 1500 05/05/14 0546  PROCALCITON <0.10 <0.10     ENDOCRINE CBG (last 3)  No results found for this basename: GLUCAP,  in the last 72 hours       IMAGING x48h Ct Chest High Resolution  05/06/2014   CLINICAL DATA:  Cough and shortness of breath. Abnormal breath sounds. Evaluate for interstitial lung disease.  EXAM: CT CHEST WITHOUT CONTRAST  TECHNIQUE: Multidetector CT imaging of the  chest was performed following the standard protocol without intravenous contrast. High resolution imaging of the lungs, as well as inspiratory and expiratory imaging, was performed.  COMPARISON:  05/03/2014, 02/25/2008 and 11/02/2004.  FINDINGS: No pathologically enlarged mediastinal or axillary lymph nodes. Hilar regions are difficult to definitively evaluate without IV contrast but appear grossly unremarkable. Atherosclerotic calcification of the arterial vasculature, including three-vessel involvement of the coronary arteries. Heart is enlarged. No pericardial effusion.  Image  quality is degraded by respiratory motion and largely expiratory phase imaging. Therefore, evaluation of scattered increased densities in the lungs is limited and may simply relate to expiratory phase imaging. Of note, inspiratory phase imaging in the prone position largely shows clearing of the ground-glass seen on conventional supine imaging. There may be very mild scattered subpleural reticulation bilaterally (prone series, images 15 and 20). No definite traction bronchiectasis/bronchiolectasis, architectural distortion or honeycombing. No pleural fluid. Airway is otherwise unremarkable.  Incidental imaging of the upper abdomen shows the visualized portions of the liver, right adrenal gland, spleen and stomach to be grossly unremarkable. No worrisome lytic or sclerotic lesions. Degenerative changes are seen in the spine.  IMPRESSION: 1. Image quality is degraded by respiratory motion and expiratory phase imaging. However, inspiratory prone imaging suggests very mild subpleural reticulation at the lung bases, and therefore very mild subpleural fibrosis is considered. 2. Three-vessel coronary artery calcification.   Electronically Signed   By: Leanna Battles M.D.   On: 05/06/2014 08:36       ASSESSMENT / PLAN:  Dyspnea /Cough   - PE/dVT  ruled out  - sepsis ruled out with normal PCT  - Crackles point to ILD in setting of normal BNP and normal LVEF  And Isolated Low DLCO 40%  on PFT  - but factors against ILD are faact she did not desaturate and CT is not uneqvivocal due to motion artifact although prone images suggest ILD presence   - if ILD present so far no evidence of autoimmune on panel  - if ILD persent not sure this is UIP/IPF  PLAN  -if suspicion of ILD is high, this is not UIP/IPF radiologically - so she needs surgical lung bx per ATS 2011 criteria but I am not sure if she has ILD or not. Therefore, will defer referral for biopsuy   - will opt to Rx for another episode of bronchitis and  non pulmonary causes of cough as follows. TRH please start as follows   - Take prednisone 40 mg daily x 2 days, then  daily x 2 days, then  daily x 2 days, then  daily x 2 days and stop - I h ave written first dose   - OPD doxy  for total 5 days since admit - I have writtent this to start 05/06/2014 as inpatient    - nasal steroid - written as inpatient 05/06/2014   - PPI - written as inpatient 05/06/2014   - OPD fu with ME and I will reassess 05/23/14 14.15h with  Kalman Shan and reassess        Dr. Kalman Shan, M.D., [facility]C.P Pulmonary and Critical Care Medicine Staff Physician [facility] [place] [facility] Pager: [phone], If no answer or between  15:00h - 7:00h: call [phone]  05/06/2014 9:03 AM

## 2014-05-09 LAB — CULTURE, BLOOD (ROUTINE X 2): CULTURE: NO GROWTH

## 2014-05-10 LAB — CYCLIC CITRUL PEPTIDE ANTIBODY, IGG

## 2014-05-23 ENCOUNTER — Encounter: Payer: Self-pay | Admitting: Internal Medicine

## 2014-05-23 ENCOUNTER — Ambulatory Visit (INDEPENDENT_AMBULATORY_CARE_PROVIDER_SITE_OTHER): Payer: Medicare Other | Admitting: Internal Medicine

## 2014-05-23 VITALS — BP 144/90 | HR 74 | Ht 67.0 in | Wt 211.0 lb

## 2014-05-23 DIAGNOSIS — R0609 Other forms of dyspnea: Secondary | ICD-10-CM

## 2014-05-23 DIAGNOSIS — R0600 Dyspnea, unspecified: Secondary | ICD-10-CM

## 2014-05-23 DIAGNOSIS — R0989 Other specified symptoms and signs involving the circulatory and respiratory systems: Secondary | ICD-10-CM

## 2014-05-23 DIAGNOSIS — R05 Cough: Secondary | ICD-10-CM

## 2014-05-23 DIAGNOSIS — R059 Cough, unspecified: Secondary | ICD-10-CM

## 2014-05-23 MED ORDER — ALBUTEROL SULFATE HFA 108 (90 BASE) MCG/ACT IN AERS: 2.0000 | INHALATION_SPRAY | Freq: Four times a day (QID) | RESPIRATORY_TRACT | Status: DC | PRN

## 2014-05-23 NOTE — Patient Instructions (Addendum)
#  Cough  Likely due to viral pneumonitis Glad cough is better Continue flonase and protonix Take albuterol as needed but not more than 4t/day REturn in 2-3 months to see my NP  #Shortness of breath and fatigue - likely post hospital deconditioning  - get back into exercise program -  Do walk test in office 05/23/2014  - use albuterol as needed   #Followup  2-3 months with my NP [name]

## 2014-05-23 NOTE — Progress Notes (Signed)
Subjective:    Patient ID: [name], female    DOB: 28-Apr-1936, 78 y.o.   MRN: [id number]  HPI   OV 05/23/2014  Chief Complaint  Patient presents with  . Follow-up    HU- Pt here after d/c from [facility] for CAP.     I had seen her end august 2015 druing hospitalization of CAP NOS - viral likely. She was admitted with new dyspnea cough several weeks duration. SEpsis and PE ruled out. Normal BNP and LVEF but she had crackles. THere was concern for ILD but she moved during CT x 2 making it impossible to figure out  If she has ILD. PFT showe resriction but she is obese.  Autoimmune was negative. At dc 3 weeks ago she di dnot desaturate. So we placed her on expectant fu  Today cough is 75% better. Still some thickness of sputum with cough and clearing of throat. Compliant with flonase. Tried some sample albuterol and it helped. She wants more of this. However, dyspnea and fatigue class 2 levels persist and unchanged  Review of Systems  Constitutional: Negative for fever and unexpected weight change.  HENT: Negative for congestion, dental problem, ear pain, nosebleeds, postnasal drip, rhinorrhea, sinus pressure, sneezing, sore throat and trouble swallowing.   Eyes: Negative for redness and itching.  Respiratory: Positive for cough and shortness of breath. Negative for chest tightness and wheezing.   Cardiovascular: Positive for chest pain. Negative for palpitations and leg swelling.  Gastrointestinal: Negative for nausea and vomiting.  Genitourinary: Negative for dysuria.  Musculoskeletal: Negative for joint swelling.  Skin: Negative for rash.  Neurological: Negative for headaches.  Hematological: Does not bruise/bleed easily.  Psychiatric/Behavioral: Negative for dysphoric mood. The patient is not nervous/anxious.    Current outpatient prescriptions:acetaminophen (TYLENOL) 500 MG tablet, Take 500 mg by mouth daily as needed (pain)., Disp: , Rfl: ;  albuterol (PROVENTIL  HFA;VENTOLIN HFA) 108 (90 BASE) MCG/ACT inhaler, Inhale 2 puffs into the lungs every 6 (six) hours as needed for wheezing., Disp: , Rfl: ;  ALPRAZolam (XANAX) 0.25 MG tablet, Take 0.25 mg by mouth 2 (two) times daily. anxiety, Disp: , Rfl:  aspirin EC 81 MG tablet, Take 81 mg by mouth daily.  , Disp: , Rfl: ;  clopidogrel (PLAVIX) 75 MG tablet, Take 75 mg by mouth daily., Disp: , Rfl: ;  fluticasone (FLONASE) 50 MCG/ACT nasal spray, Place 2 sprays into both nostrils daily., Disp: 1 g, Rfl: 2;  furosemide (LASIX) 20 MG tablet, Take 10 mg by mouth daily. , Disp: , Rfl:  gabapentin (NEURONTIN) 300 MG capsule, Take 1 capsule (300 mg total) by mouth 3 (three) times daily., Disp: 90 capsule, Rfl: 1;  HYDROcodone-acetaminophen (NORCO/VICODIN) 5-325 MG per tablet, Take 0.5-1 tablets by mouth daily as needed for moderate pain., Disp: 30 tablet, Rfl: 0;  loratadine (CLARITIN) 10 MG tablet, Take 10 mg by mouth daily. , Disp: , Rfl:  Nepafenac (ILEVRO) 0.3 % SUSP, Apply 1 drop to eye See admin instructions. Instill 1 drop in left eye daily for 3 more days (until 05/05/14), instill 1 drop in right eye daily for 10 days starting 05/01/14, Disp: , Rfl: ;  nitroGLYCERIN (NITROSTAT) 0.4 MG SL tablet, Place 1 tablet (0.4 mg total) under the tongue every 5 (five) minutes as needed for chest pain (CP or SOB)., Disp: 25 tablet, Rfl: 12 pantoprazole (PROTONIX) 40 MG tablet, Take 1 tablet (40 mg total) by mouth daily at 12 noon., Disp: 21 tablet, Rfl: 0;  potassium chloride SA (K-DUR,KLOR-CON) 20 MEQ tablet, Take 20 mEq by mouth daily., Disp: , Rfl: ;  valsartan-hydrochlorothiazide (DIOVAN-HCT) 160-12.5 MG per tablet, Take 1 tablet by mouth daily. , Disp: , Rfl: ;  zolpidem (AMBIEN) 10 MG tablet, Take 5 mg by mouth daily as needed for sleep. , Disp: , Rfl:  [DISCONTINUED] ezetimibe (ZETIA) 10 MG tablet, Take 10 mg by mouth daily.  , Disp: , Rfl: ;  [DISCONTINUED] metoprolol (TOPROL-XL) 50 MG 24 hr tablet, Take 50 mg by mouth daily.  ,  Disp: , Rfl: ;  [DISCONTINUED] potassium chloride (KLOR-CON) 20 MEQ packet, Take 20 mEq by mouth daily.  , Disp: , Rfl:      Objective:   Physical Exam  Vitals reviewed. Constitutional: She is oriented to person, place, and time. She appears well-developed and well-nourished. No distress.  HENT:  Head: Normocephalic and atraumatic.  Right Ear: External ear normal.  Left Ear: External ear normal.  Mouth/Throat: Oropharynx is clear and moist. No oropharyngeal exudate.  Clears throat occ  Eyes: Conjunctivae and EOM are normal. Pupils are equal, round, and reactive to light. Right eye exhibits no discharge. Left eye exhibits no discharge. No scleral icterus.  Neck: Normal range of motion. Neck supple. No JVD present. No tracheal deviation present. No thyromegaly present.  Cardiovascular: Normal rate, regular rhythm, normal heart sounds and intact distal pulses.  Exam reveals no gallop and no friction rub.   No murmur heard. Pulmonary/Chest: Effort normal and breath sounds normal. No respiratory distress. She has no wheezes. She has no rales. She exhibits no tenderness.  Crackles resolved  Abdominal: Soft. Bowel sounds are normal. She exhibits no distension and no mass. There is no tenderness. There is no rebound and no guarding.  Musculoskeletal: Normal range of motion. She exhibits no edema and no tenderness.  Lymphadenopathy:    She has no cervical adenopathy.  Neurological: She is alert and oriented to person, place, and time. She has normal reflexes. No cranial nerve deficit. She exhibits normal muscle tone. Coordination normal.  Skin: Skin is warm and dry. No rash noted. She is not diaphoretic. No erythema. No pallor.  Psychiatric: She has a normal mood and affect. Her behavior is normal. Judgment and thought content normal.     Filed Vitals:   05/23/14 1434  BP: 144/90  Pulse: 74  Height:  (1.702 m)  Weight: 211 lb (95.709 kg)  SpO2: 98%        Assessment & Plan:    #Cough  Likely due to viral pneumonitis Glad cough is better Continue flonase and protonix Take albuterol as needed but not more than 4t/day REturn in 2-3 months to see my NP  #Shortness of breath and fatigue - likely post hospital deconditioning  - get back into exercise program -  Do walk test in office 05/23/2014  - use albuterol as needed   #Followup  2-3 months with my NP Louellen Molder

## 2014-06-14 ENCOUNTER — Ambulatory Visit: Payer: Medicare Other | Attending: Internal Medicine | Admitting: Physical Therapy

## 2014-06-14 DIAGNOSIS — Z5189 Encounter for other specified aftercare: Secondary | ICD-10-CM | POA: Insufficient documentation

## 2014-06-14 DIAGNOSIS — I10 Essential (primary) hypertension: Secondary | ICD-10-CM | POA: Insufficient documentation

## 2014-06-14 DIAGNOSIS — M25551 Pain in right hip: Secondary | ICD-10-CM | POA: Insufficient documentation

## 2014-06-14 DIAGNOSIS — R262 Difficulty in walking, not elsewhere classified: Secondary | ICD-10-CM | POA: Diagnosis not present

## 2014-06-14 DIAGNOSIS — J189 Pneumonia, unspecified organism: Secondary | ICD-10-CM | POA: Diagnosis not present

## 2014-06-16 ENCOUNTER — Ambulatory Visit: Payer: Medicare Other | Admitting: Rehabilitation

## 2014-06-16 DIAGNOSIS — Z5189 Encounter for other specified aftercare: Secondary | ICD-10-CM | POA: Diagnosis not present

## 2014-06-27 ENCOUNTER — Ambulatory Visit: Payer: Medicare Other | Admitting: Physical Therapy

## 2014-06-27 DIAGNOSIS — Z5189 Encounter for other specified aftercare: Secondary | ICD-10-CM | POA: Diagnosis not present

## 2014-06-28 ENCOUNTER — Other Ambulatory Visit: Payer: Self-pay

## 2014-06-28 MED ORDER — GABAPENTIN 300 MG PO CAPS: 300.0000 mg | ORAL_CAPSULE | Freq: Three times a day (TID) | ORAL | Status: DC

## 2014-06-30 ENCOUNTER — Ambulatory Visit: Payer: Medicare Other | Admitting: Physical Therapy

## 2014-06-30 DIAGNOSIS — Z5189 Encounter for other specified aftercare: Secondary | ICD-10-CM | POA: Diagnosis not present

## 2014-07-11 ENCOUNTER — Ambulatory Visit: Payer: Medicare Other | Admitting: Neurology

## 2014-07-12 ENCOUNTER — Ambulatory Visit: Payer: Medicare Other | Attending: Internal Medicine | Admitting: Rehabilitation

## 2014-07-12 ENCOUNTER — Telehealth: Payer: Self-pay | Admitting: Physical Therapy

## 2014-07-12 DIAGNOSIS — R262 Difficulty in walking, not elsewhere classified: Secondary | ICD-10-CM | POA: Insufficient documentation

## 2014-07-12 DIAGNOSIS — J189 Pneumonia, unspecified organism: Secondary | ICD-10-CM | POA: Insufficient documentation

## 2014-07-12 DIAGNOSIS — M25551 Pain in right hip: Secondary | ICD-10-CM | POA: Insufficient documentation

## 2014-07-12 DIAGNOSIS — I10 Essential (primary) hypertension: Secondary | ICD-10-CM | POA: Insufficient documentation

## 2014-07-12 DIAGNOSIS — Z5189 Encounter for other specified aftercare: Secondary | ICD-10-CM | POA: Insufficient documentation

## 2014-07-12 NOTE — Telephone Encounter (Signed)
Patient mixed up appointment time. Will be at next P.T.  appointment Friday 07/15/2014 at 845.

## 2014-07-15 ENCOUNTER — Ambulatory Visit: Payer: Medicare Other | Admitting: Physical Therapy

## 2014-07-15 ENCOUNTER — Encounter: Payer: Self-pay | Admitting: Physical Therapy

## 2014-07-15 DIAGNOSIS — I10 Essential (primary) hypertension: Secondary | ICD-10-CM | POA: Diagnosis not present

## 2014-07-15 DIAGNOSIS — M25551 Pain in right hip: Secondary | ICD-10-CM

## 2014-07-15 DIAGNOSIS — R262 Difficulty in walking, not elsewhere classified: Secondary | ICD-10-CM

## 2014-07-15 DIAGNOSIS — J189 Pneumonia, unspecified organism: Secondary | ICD-10-CM | POA: Diagnosis not present

## 2014-07-15 DIAGNOSIS — Z5189 Encounter for other specified aftercare: Secondary | ICD-10-CM | POA: Diagnosis not present

## 2014-07-15 NOTE — Therapy (Signed)
Physical Therapy Treatment  Patient Details  Name: [name] MRN: [id number] Date of Birth: 16-Apr-1936  Encounter Date: 07/15/2014      PT End of Session - 07/15/14 1006    Visit Number 5   Number of Visits 16   Date for PT Re-Evaluation 08/09/14   PT Start Time 0855   PT Stop Time 0937   PT Time Calculation (min) 42 min   Activity Tolerance Patient tolerated treatment well      Past Medical History  Diagnosis Date  . Coronary artery disease   . Hypertension   . Anxiety   . Fibromyalgia   . Dysrhythmia     history of "irregular heart rate"'; pt. thinks it was A-fib  . Vertigo 08/27/2006    Archie Endo 08/27/2006 (01/27/2013)  . Ventricular hypertrophy     Archie Endo 08/27/2006 (01/27/2013)  . High cholesterol   . Anginal pain   . Exertional shortness of breath     "sometimes" (01/27/2013)  . History of stomach ulcers     "years ago" (528/2014)  . GERD (gastroesophageal reflux disease)   . Arthritis     "q where" (01/27/2013)  . Osteoporosis   . Acute myocardial infarction, unspecified site, initial episode of care   . Acute myocardial infarction of other lateral wall, initial episode of care     Past Surgical History  Procedure Laterality Date  . Hernia repair    . Laparotomy      "day after oophorectomy; had bowel obstruction" (01/27/2013)  . Coronary angioplasty with stent placement  06/2005; 01/27/2013    stent to LAD/notes 08/27/2006; "+ 1 stent today" (01/27/2013)  . Cardiac catheterization  10/2006    Archie Endo 08/03/2007 (01/27/2013)  . Tonsillectomy and adenoidectomy  ~ 1952  . Abdominal hysterectomy  1970's  . Laparoscopic salpingo oopherectomy  2000  . Diagnostic laparoscopy  2000    lap abdominal lysis of adhesions  . Breast lumpectomy Right 1970's    benign   . Breast biopsy Right 1970's    There were no vitals taken for this visit.  Visit Diagnosis:  Difficulty walking  Pain in hip, right      Subjective Assessment - 07/15/14 0902    Symptoms No pain in a few days, reports improvement with mobility, walking and is now sleeping on Rt. side without difficulty.    Limitations Walking   How long can you stand comfortably? 10 min   How long can you walk comfortably? 5 min without walker   Currently in Pain? No/denies   Multiple Pain Sites No          [facility] PT Assessment - 07/15/14 0916    Strength   Right Hip Flexion --  [facility]   Right Hip ABduction 3/5   Left Hip Flexion --  [facility]   Left Hip ABduction 3-/5   Flexibility   ITB --  discomfort with stretching   Ambulation/Gait   Ambulation/Gait Yes   Assistive device --  No device today, no pain and no limp          [facility] Adult PT Treatment/Exercise - 07/15/14 0919    Exercises   Exercises --  NuStep level 7,  8 min for strengthening   Knee/Hip Exercises: Stretches   Hip Flexor Stretch 2 reps;30 seconds   ITB Stretch 2 reps;30 seconds   Knee/Hip Exercises: Machines for Strengthening   Cybex Leg Press --  2 plates x 20, 2 sets   Knee/Hip Exercises: Supine  Straight Leg Raises Strengthening;Both;1 set  ABD and FLEX x10 each   Knee/Hip Exercises: Sidelying   Hip ABduction --  clam x20 each LE          PT Education - 07/15/14 1006    Education provided Yes   Education Details corrected HEP   Person(s) Educated Patient   Methods Explanation;Verbal cues   Comprehension Verbalized understanding;Returned demonstration          PT Short Term Goals - 07/15/14 1010    PT SHORT TERM GOAL #1   Title All short term goals met as of 06/27/14.           PT Long Term Goals - 07/15/14 1010    PT LONG TERM GOAL #1   Title Pt. will demo/verbalize techniques to reduce risk of re-injury to include posture, lifting, body mechanics.    Time 8   Period Weeks   Status On-going   PT LONG TERM GOAL #2   Title Pt will be I with advanced HEP   Time 8   Period Weeks   Status On-going   PT LONG TERM GOAL #3   Title Pt. will walk in the community without  walker for short errands and min pain in hip.   Baseline As of 07/15/14, she uses walker in community >75% of the time and has no difficulty.    Time 8   Period Weeks   Status On-going   PT LONG TERM GOAL #4   Title Pt. will increase hip strength in ABD to 4+/5 to improve gait efficiency   Time 8   Period Weeks   Status On-going   PT LONG TERM GOAL #5   Title Pt. will return to [facility] at least 1 time per week and report no increase in pain   Time 8   Period Weeks   Status On-going          Plan - 07/15/14 1007    Clinical Impression Statement Mrs. [name] is doing well this week, despite not having PT.  Her husband has had BP issues this week yet she has done her HEP.  She presented today without pain or device and no observed limp.  She will continue to benefit from PT for mobility, hip and core strength.    Pt will benefit from skilled therapeutic intervention in order to improve on the following deficits Decreased range of motion;Difficulty walking;Decreased endurance;Increased edema;Decreased activity tolerance;Pain;Decreased mobility;Decreased strength   Rehab Potential Excellent   PT Frequency 2x / week   PT Duration 8 weeks   PT Treatment/Interventions Moist Heat;Therapeutic activities;Patient/family education;Therapeutic exercise;Ultrasound;Gait training;Balance training;Manual techniques;Stair training;Electrical Stimulation   PT Next Visit Plan Progress ther ex and introduce simple balance ex.    Consulted and Agree with Plan of Care Patient        Problem List Patient Active Problem List   Diagnosis Date Noted  . Dyspnea 05/05/2014  . Cough 05/05/2014  . Pulmonary infiltrates 05/04/2014  . CAP (community acquired pneumonia) 05/02/2014  . Atypical face pain 03/24/2014  . Facial numbness 03/24/2014  . Dysuria 01/26/2014  . Atrophic vaginitis 01/26/2014  . Other and unspecified hyperlipidemia 07/23/2013  . Old MI (myocardial infarction)     Class: Chronic  .  Abdominal pain 11/13/2011  . Nausea and vomiting 11/13/2011  . Vertigo, benign positional 07/07/2011  . HTN (hypertension), malignant 07/07/2011  . Dizziness 07/07/2011  . CAD (coronary artery disease) 07/07/2011  . A-fib 07/07/2011  . Abdominal pain, lower 07/07/2011  Raeford Razor, PT 07/15/2014, 10:18 AM

## 2014-07-18 ENCOUNTER — Ambulatory Visit: Payer: Medicare Other | Admitting: Rehabilitation

## 2014-07-18 DIAGNOSIS — R262 Difficulty in walking, not elsewhere classified: Secondary | ICD-10-CM

## 2014-07-18 DIAGNOSIS — Z5189 Encounter for other specified aftercare: Secondary | ICD-10-CM | POA: Diagnosis not present

## 2014-07-18 DIAGNOSIS — M25551 Pain in right hip: Secondary | ICD-10-CM

## 2014-07-18 NOTE — Therapy (Addendum)
Physical Therapy Treatment  Patient Details  Name: [name] MRN: [id number] Date of Birth: 12-24-1935  Encounter Date: 07/18/2014      PT End of Session - 07/18/14 1208    Visit Number 6   Number of Visits 16   Date for PT Re-Evaluation 08/09/14   PT Start Time 1202   PT Stop Time 1234   PT Time Calculation (min) 32 min      Past Medical History  Diagnosis Date  . Coronary artery disease   . Hypertension   . Anxiety   . Fibromyalgia   . Dysrhythmia     history of "irregular heart rate"'; pt. thinks it was A-fib  . Vertigo 08/27/2006    [name]/notes 08/27/2006 (01/27/2013)  . Ventricular hypertrophy     [name]/notes 08/27/2006 (01/27/2013)  . High cholesterol   . Anginal pain   . Exertional shortness of breath     "sometimes" (01/27/2013)  . History of stomach ulcers     "years ago" (528/2014)  . GERD (gastroesophageal reflux disease)   . Arthritis     "q where" (01/27/2013)  . Osteoporosis   . Acute myocardial infarction, unspecified site, initial episode of care   . Acute myocardial infarction of other lateral wall, initial episode of care     Past Surgical History  Procedure Laterality Date  . Hernia repair    . Laparotomy      "day after oophorectomy; had bowel obstruction" (01/27/2013)  . Coronary angioplasty with stent placement  06/2005; 01/27/2013    stent to LAD/notes 08/27/2006; "+ 1 stent today" (01/27/2013)  . Cardiac catheterization  10/2006    [name]/notes 08/03/2007 (01/27/2013)  . Tonsillectomy and adenoidectomy  ~ 1952  . Abdominal hysterectomy  1970's  . Laparoscopic salpingo oopherectomy  2000  . Diagnostic laparoscopy  2000    lap abdominal lysis of adhesions  . Breast lumpectomy Right 1970's    benign   . Breast biopsy Right 1970's    There were no vitals taken for this visit.  Visit Diagnosis:  Difficulty walking  Pain in hip, right      Subjective Assessment - 07/18/14 1206    Currently in Pain? Yes   Pain Score 4    Pain Location Hip    Pain Orientation Right   Pain Descriptors / Indicators Aching   Pain Onset More than a month ago   Pain Frequency Intermittent   Aggravating Factors  weight bearing, prolonged activity like standing and walking   Pain Relieving Factors rest   Multiple Pain Sites No          [facility] PT Assessment - 07/18/14 1218    Strength   Right Hip ABduction 3+/5   Left Hip ABduction 3/5          [facility] Adult PT Treatment/Exercise - 07/18/14 1223    Exercises   Exercises --  Nustep Level 7 8 min UE/LE   Knee/Hip Exercises: Supine   Bridges AROM;10 reps  with ball squeeze   Straight Leg Raises Strengthening;Both;1 set  ABD and FLEX x10 each x2    Knee/Hip Exercises: Sidelying   Hip ABduction AROM;Right;2 sets;10 reps   Clams --  10 x 2 red band   Balance Exercises   Sidestepping 2 reps  20 ft each way   SLS Eyes open;2 reps;10 secs   Tandem Stance Eyes open;2 reps;30 secs              PT Long Term Goals -  07/18/14 1216    PT LONG TERM GOAL #1   Title Pt. will demo/verbalize techniques to reduce risk of re-injury to include posture, lifting, body mechanics.    Status On-going   PT LONG TERM GOAL #2   Title Pt will be I with advanced HEP   Status On-going   PT LONG TERM GOAL #3   Title Pt. will walk in the community without walker for short errands and min pain in hip.   Status Achieved   PT LONG TERM GOAL #4   Title Pt. will increase hip strength in ABD to 4+/5 to improve gait efficiency   Status On-going          Plan - 07/18/14 1208    Clinical Impression Statement Pt reports she is limping today due to increased pain with bearing weight on RLE. Recently, patient reports no pain unless she performs aggravating factors such as prolonged walking without rollator or staninding too long to prep and cook a meal.         Problem List Patient Active Problem List   Diagnosis Date Noted  . Dyspnea 05/05/2014  . Cough 05/05/2014  . Pulmonary infiltrates 05/04/2014   . CAP (community acquired pneumonia) 05/02/2014  . Atypical face pain 03/24/2014  . Facial numbness 03/24/2014  . Dysuria 01/26/2014  . Atrophic vaginitis 01/26/2014  . Other and unspecified hyperlipidemia 07/23/2013  . Old MI (myocardial infarction)     Class: Chronic  . Abdominal pain 11/13/2011  . Nausea and vomiting 11/13/2011  . Vertigo, benign positional 07/07/2011  . HTN (hypertension), malignant 07/07/2011  . Dizziness 07/07/2011  . CAD (coronary artery disease) 07/07/2011  . A-fib 07/07/2011  . Abdominal pain, lower 07/07/2011                                              Sherrie Mustacheonoho, [place] Holloway McGee, PTA 07/18/2014, 1:08 PM

## 2014-07-21 ENCOUNTER — Encounter: Payer: Medicare Other | Admitting: Physical Therapy

## 2014-07-25 ENCOUNTER — Encounter: Payer: Self-pay | Admitting: Adult Health

## 2014-07-25 ENCOUNTER — Ambulatory Visit (INDEPENDENT_AMBULATORY_CARE_PROVIDER_SITE_OTHER): Payer: Medicare Other | Admitting: Adult Health

## 2014-07-25 ENCOUNTER — Ambulatory Visit (INDEPENDENT_AMBULATORY_CARE_PROVIDER_SITE_OTHER)
Admission: RE | Admit: 2014-07-25 | Discharge: 2014-07-25 | Disposition: A | Discharge status: Home / Self Care | Payer: Medicare Other | Source: Ambulatory Visit | Attending: Adult Health | Admitting: Adult Health

## 2014-07-25 VITALS — BP 132/74 | HR 87 | Temp 97.4°F | Ht 67.0 in | Wt 212.6 lb

## 2014-07-25 DIAGNOSIS — J189 Pneumonia, unspecified organism: Secondary | ICD-10-CM

## 2014-07-25 NOTE — Patient Instructions (Signed)
Claritin daily as needed. For postnasal drainage. Saline nasal rinses as needed. Continue Flonase as directed Mucinex DM twice daily as needed for cough and congestion. Follow with Dr. Marchelle Gearingamaswamy in 3 months and as needed.

## 2014-07-25 NOTE — Progress Notes (Signed)
Subjective:    Patient ID: [name], female    DOB: [birth year], 78 y.o.   MRN: [id number]  HPI  OV 05/23/2014  Chief Complaint  Patient presents with  . Follow-up    HU- [facility]     I had seen her end august 2015 druing hospitalization of CAP NOS - viral likely. She was admitted with new dyspnea cough several weeks duration. SEpsis and PE ruled out. Normal BNP and LVEF but she had crackles. THere was concern for ILD but she moved during CT x 2 making it impossible to figure out  If she has ILD. PFT showe resriction but she is obese.  Autoimmune was negative. At dc 3 weeks ago she di dnot desaturate. So we placed her on expectant fu  Today cough is 75% better. Still some thickness of sputum with cough and clearing of throat. Compliant with flonase. Tried some sample albuterol and it helped. She wants more of this. However, dyspnea and fatigue class 2 levels persist and unchanged  07/25/2014 Follow up Cough /CAP  Returns for follow up.  Was admitted in 04/2014 for CAP . Had lingering cough  Underwent workup for autoimmune which was neg. HRCT w/ non dx for ILD. (poor quality )  Reports symptoms are some improved but still producing green mucus mainly in am and having dyspnea and occasional wheezing and tightness. . Overall is feeling much better. Starting to get her energy level back .  Denies f/c/s, n/v/d, hemoptysis  Follows with Dr. Marcheta Grammesevashar with Rheumatology and [facility] .    Review of Systems  Constitutional:   No  weight loss, night sweats,  Fevers, chills, fatigue, or  lassitude.  HEENT:   No headaches,  Difficulty swallowing,  Tooth/dental problems, or  Sore throat,                No sneezing, itching, ear ache, nasal congestion, post nasal drip,   CV:  No chest pain,  Orthopnea, PND, swelling in lower extremities, anasarca, dizziness, palpitations, syncope.   GI  No heartburn, indigestion, abdominal pain,  nausea, vomiting, diarrhea, change in bowel habits, loss of appetite, bloody stools.   Resp:   No chest wall deformity  Skin: no rash or lesions.  GU: no dysuria, change in color of urine, no urgency or frequency.  No flank pain, no hematuria   MS:  No joint pain or swelling.  No decreased range of motion.  No back pain.  Psych:  No change in mood or affect. No depression or anxiety.  No memory loss.         Objective:   Physical Exam GEN: A/Ox3; pleasant , NAD, well nourished   HEENT:  [place]/AT,  EACs-clear, TMs-wnl, NOSE-clear drainage  THROAT-clear, no lesions, no postnasal drip or exudate noted.   NECK:  Supple w/ fair ROM; no JVD; normal carotid impulses w/o bruits; no thyromegaly or nodules palpated; no lymphadenopathy.  RESP  Clear  P & A; w/o, wheezes/ rales/ or rhonchi.no accessory muscle use, no dullness to percussion  CARD:  RRR, no m/r/g  , no peripheral edema, pulses intact, no cyanosis or clubbing.  GI:   Soft & nt; nml bowel sounds; no organomegaly or masses detected.  Musco: Warm bil, no deformities or joint swelling noted.   Neuro: alert, no focal deficits noted.    Skin: Warm, no lesions or rashes      Assessment &  Plan:

## 2014-07-25 NOTE — Assessment & Plan Note (Signed)
Clinically improving , residual cough is improving  Check cxr today   Plan  Claritin daily as needed. For postnasal drainage. Saline nasal rinses as needed. Continue Flonase as directed Mucinex DM twice daily as needed for cough and congestion. Follow with Dr. Marchelle Gearingamaswamy in 3 months and as needed

## 2014-07-25 NOTE — Addendum Note (Signed)
Addended by: Boone MasterJONES, JESSICA E on: 07/25/2014 03:42 PM   Modules accepted: Orders

## 2014-07-26 ENCOUNTER — Ambulatory Visit: Payer: Medicare Other | Admitting: Rehabilitation

## 2014-07-26 DIAGNOSIS — R262 Difficulty in walking, not elsewhere classified: Secondary | ICD-10-CM

## 2014-07-26 DIAGNOSIS — M25551 Pain in right hip: Secondary | ICD-10-CM

## 2014-07-26 DIAGNOSIS — Z5189 Encounter for other specified aftercare: Secondary | ICD-10-CM | POA: Diagnosis not present

## 2014-07-26 NOTE — Therapy (Signed)
Physical Therapy Treatment  Patient Details  Name: [name] MRN: [id number] Date of Birth: November 07, 1935  Encounter Date: 07/26/2014      PT End of Session - 07/26/14 1122    Visit Number 7   Number of Visits 16   Date for PT Re-Evaluation 08/09/14   PT Start Time 1116   PT Stop Time 1147   PT Time Calculation (min) 31 min      Past Medical History  Diagnosis Date  . Coronary artery disease   . Hypertension   . Anxiety   . Fibromyalgia   . Dysrhythmia     history of "irregular heart rate"'; pt. thinks it was A-fib  . Vertigo 08/27/2006    [name]/notes 08/27/2006 (01/27/2013)  . Ventricular hypertrophy     [name]/notes 08/27/2006 (01/27/2013)  . High cholesterol   . Anginal pain   . Exertional shortness of breath     "sometimes" (01/27/2013)  . History of stomach ulcers     "years ago" (528/2014)  . GERD (gastroesophageal reflux disease)   . Arthritis     "q where" (01/27/2013)  . Osteoporosis   . Acute myocardial infarction, unspecified site, initial episode of care   . Acute myocardial infarction of other lateral wall, initial episode of care     Past Surgical History  Procedure Laterality Date  . Hernia repair    . Laparotomy      "day after oophorectomy; had bowel obstruction" (01/27/2013)  . Coronary angioplasty with stent placement  06/2005; 01/27/2013    stent to LAD/notes 08/27/2006; "+ 1 stent today" (01/27/2013)  . Cardiac catheterization  10/2006    [name]/notes 08/03/2007 (01/27/2013)  . Tonsillectomy and adenoidectomy  ~ 1952  . Abdominal hysterectomy  1970's  . Laparoscopic salpingo oopherectomy  2000  . Diagnostic laparoscopy  2000    lap abdominal lysis of adhesions  . Breast lumpectomy Right 1970's    benign   . Breast biopsy Right 1970's    There were no vitals taken for this visit.  Visit Diagnosis:  Pain in hip, right  Difficulty walking      Subjective Assessment - 07/26/14 1119    Symptoms no pain, just tired, pain two nights ago aftre  prolonged standing and walking   Limitations Standing;Walking   How long can you stand comfortably? 15 min   How long can you walk comfortably? 15 min   Currently in Pain? No/denies   Aggravating Factors  prolonged stand / walk   Pain Relieving Factors rest   Multiple Pain Sites No          [facility] PT Assessment - 07/26/14 1124    Strength   Right Hip ABduction --  4-/5   Left Hip ABduction 3+/5          [facility] Adult PT Treatment/Exercise - 07/26/14 1127    Knee/Hip Exercises: Standing   SLS with Vectors 3 way 2# chair for 1UE support   Knee/Hip Exercises: Supine   Bridges AROM;10 reps;2 sets  with ball squeeze   Straight Leg Raises Strengthening;10 reps   Straight Leg Raises Limitations 2#   Knee/Hip Exercises: Sidelying   Hip ABduction Strengthening;10 reps   Hip ABduction Limitations 2#   Clams --  10x2 with 4# bilateral   Balance Exercises: Standing   SLS Eyes open  3 sec best bilateral, with 1 finger support 30 sec each   SLS Time 3  PT Long Term Goals - 07/26/14 1132    PT LONG TERM GOAL #1   Title Pt. will demo/verbalize techniques to reduce risk of re-injury to include posture, lifting, body mechanics.    Time 8   Period Weeks   Status On-going   PT LONG TERM GOAL #2   Title Pt will be I with advanced HEP   Time 8   Period Weeks   Status On-going   PT LONG TERM GOAL #3   Title Pt. will walk in the community without walker for short errands and min pain in hip.   Time 8   Period Weeks   Status Achieved   PT LONG TERM GOAL #4   Title Pt. will increase hip strength in ABD to 4+/5 to improve gait efficiency   Period Weeks   Status On-going   PT LONG TERM GOAL #5   Title Pt. will return [facility] at least 1 time per week and report no increase in pain   Time 8   Period Weeks   Status On-going          Plan - 07/26/14 1133    Clinical Impression Statement Making progress toward Strength goal, Pt reports [facility] plans [facility]  after the upcoming holiday   PT Next Visit Plan continue toward goals, continue balance exercises        Problem List Patient Active Problem List   Diagnosis Date Noted  . Dyspnea 05/05/2014  . Cough 05/05/2014  . Pulmonary infiltrates 05/04/2014  . CAP (community acquired pneumonia) 05/02/2014  . Atypical face pain 03/24/2014  . Facial numbness 03/24/2014  . Dysuria 01/26/2014  . Atrophic vaginitis 01/26/2014  . Other and unspecified hyperlipidemia 07/23/2013  . Old MI (myocardial infarction)     Class: Chronic  . Abdominal pain 11/13/2011  . Nausea and vomiting 11/13/2011  . Vertigo, benign positional 07/07/2011  . HTN (hypertension), malignant 07/07/2011  . Dizziness 07/07/2011  . CAD (coronary artery disease) 07/07/2011  . A-fib 07/07/2011  . Abdominal pain, lower 07/07/2011        Sherrie Mustacheonoho, Chadric Kimberley McGee, PTA 07/26/2014, 11:59 AM

## 2014-08-02 ENCOUNTER — Ambulatory Visit: Payer: Medicare Other | Attending: Internal Medicine | Admitting: Rehabilitation

## 2014-08-02 DIAGNOSIS — J189 Pneumonia, unspecified organism: Secondary | ICD-10-CM | POA: Insufficient documentation

## 2014-08-02 DIAGNOSIS — R262 Difficulty in walking, not elsewhere classified: Secondary | ICD-10-CM

## 2014-08-02 DIAGNOSIS — Z5189 Encounter for other specified aftercare: Secondary | ICD-10-CM | POA: Insufficient documentation

## 2014-08-02 DIAGNOSIS — I10 Essential (primary) hypertension: Secondary | ICD-10-CM | POA: Diagnosis not present

## 2014-08-02 DIAGNOSIS — M25551 Pain in right hip: Secondary | ICD-10-CM

## 2014-08-02 NOTE — Therapy (Signed)
Physical Therapy Treatment  Patient Details  Name: [name] MRN: [id number] Date of Birth: March 08, 1936  Encounter Date: 08/02/2014      PT End of Session - 08/02/14 1115    Visit Number 8   Number of Visits 16   Date for PT Re-Evaluation 08/09/14   PT Start Time 1108   PT Stop Time 1146   PT Time Calculation (min) 38 min      Past Medical History  Diagnosis Date  . Coronary artery disease   . Hypertension   . Anxiety   . Fibromyalgia   . Dysrhythmia     history of "irregular heart rate"'; pt. thinks it was A-fib  . Vertigo 08/27/2006    [name]/notes 08/27/2006 (01/27/2013)  . Ventricular hypertrophy     [name]/notes 08/27/2006 (01/27/2013)  . High cholesterol   . Anginal pain   . Exertional shortness of breath     "sometimes" (01/27/2013)  . History of stomach ulcers     "years ago" (528/2014)  . GERD (gastroesophageal reflux disease)   . Arthritis     "q where" (01/27/2013)  . Osteoporosis   . Acute myocardial infarction, unspecified site, initial episode of care   . Acute myocardial infarction of other lateral wall, initial episode of care     Past Surgical History  Procedure Laterality Date  . Hernia repair    . Laparotomy      "day after oophorectomy; had bowel obstruction" (01/27/2013)  . Coronary angioplasty with stent placement  06/2005; 01/27/2013    stent to LAD/notes 08/27/2006; "+ 1 stent today" (01/27/2013)  . Cardiac catheterization  10/2006    [name]/notes 08/03/2007 (01/27/2013)  . Tonsillectomy and adenoidectomy  ~ 1952  . Abdominal hysterectomy  1970's  . Laparoscopic salpingo oopherectomy  2000  . Diagnostic laparoscopy  2000    lap abdominal lysis of adhesions  . Breast lumpectomy Right 1970's    benign   . Breast biopsy Right 1970's    There were no vitals taken for this visit.  Visit Diagnosis:  Difficulty walking  Pain in hip, right      Subjective Assessment - 08/02/14 1113    Symptoms no pain at all   How long can you stand comfortably?  20 min   How long can you walk comfortably? not limited, per patient, 20 minutes at a time   Currently in Pain? No/denies          [facility] PT Assessment - 08/02/14 1116    Strength   Right Hip ABduction --  4-/5   Left Hip ABduction --  4-/5          [facility] Adult PT Treatment/Exercise - 08/02/14 1119    Knee/Hip Exercises: Standing   SLS with Vectors 3 way 2# chair for 1UE support   Knee/Hip Exercises: Supine   Bridges AROM;10 reps;2 sets   Straight Leg Raises Strengthening;10 reps;2 sets   Straight Leg Raises Limitations 2#   Other Supine Knee Exercises --  clams with green band x20   Knee/Hip Exercises: Sidelying   Hip ABduction Strengthening  12 reps x2   Hip ABduction Limitations 0   Clams --  x20 green band bilat   Balance Exercises: Standing   SLS --              PT Long Term Goals - 08/02/14 1137    PT LONG TERM GOAL #1   Title Pt. will demo/verbalize techniques to reduce risk of re-injury to include  posture, lifting, body mechanics.    Time 8   Period Weeks   Status On-going   PT LONG TERM GOAL #2   Title Pt will be I with advanced HEP   Time 8   Period Weeks   Status On-going   PT LONG TERM GOAL #3   Title Pt. will walk in the community without walker for short errands and min pain in hip.   Time 8   Period Weeks   Status Achieved   PT LONG TERM GOAL #4   Title Pt. will increase hip strength in ABD to 4+/5 to improve gait efficiency   Time 8   Period Weeks   Status On-going   PT LONG TERM GOAL #5   Title Pt. will [facility] at least 1 time per week and report no increase in pain   Time 8   Period Weeks   Status On-going          Plan - 08/02/14 1138    Clinical Impression Statement continues to make progress to strength goals, no pain, slowly improving stand and walk tolerance, Attended a wedding without difficulty, plans to [facility]   PT Next Visit Plan continue toward goals, continue balance exercises, renewal/  Gcode/foto?  may try resisted side stepping with cable or band        Problem List Patient Active Problem List   Diagnosis Date Noted  . Dyspnea 05/05/2014  . Cough 05/05/2014  . Pulmonary infiltrates 05/04/2014  . CAP (community acquired pneumonia) 05/02/2014  . Atypical face pain 03/24/2014  . Facial numbness 03/24/2014  . Dysuria 01/26/2014  . Atrophic vaginitis 01/26/2014  . Other and unspecified hyperlipidemia 07/23/2013  . Old MI (myocardial infarction)     Class: Chronic  . Abdominal pain 11/13/2011  . Nausea and vomiting 11/13/2011  . Vertigo, benign positional 07/07/2011  . HTN (hypertension), malignant 07/07/2011  . Dizziness 07/07/2011  . CAD (coronary artery disease) 07/07/2011  . A-fib 07/07/2011  . Abdominal pain, lower 07/07/2011         Sherrie Mustacheonoho, Jesusita Jocelyn McGee, PTA 08/02/2014, 12:08 PM

## 2014-08-04 ENCOUNTER — Ambulatory Visit: Payer: Medicare Other | Admitting: Physical Therapy

## 2014-08-04 DIAGNOSIS — Z5189 Encounter for other specified aftercare: Secondary | ICD-10-CM | POA: Diagnosis not present

## 2014-08-04 DIAGNOSIS — M25551 Pain in right hip: Secondary | ICD-10-CM

## 2014-08-04 DIAGNOSIS — R262 Difficulty in walking, not elsewhere classified: Secondary | ICD-10-CM

## 2014-08-04 NOTE — Therapy (Signed)
[facility] [address] [place], [place], [address] Phone: [phone]   Fax:  [phone]  Physical Therapy Treatment  Patient Details  Name: [name] MRN: [id number] Date of Birth: 1935/10/28  Encounter Date: 08/04/2014      PT End of Session - 08/04/14 1204    Visit Number 9   Number of Visits 16   Date for PT Re-Evaluation 08/09/14   PT Start Time 1108   PT Stop Time 1155   PT Time Calculation (min) 47 min   Equipment Utilized During Treatment Gait belt   Activity Tolerance Patient tolerated treatment well   Behavior During Therapy [facility] for tasks assessed/performed      Past Medical History  Diagnosis Date  . Coronary artery disease   . Hypertension   . Anxiety   . Fibromyalgia   . Dysrhythmia     history of "irregular heart rate"'; pt. thinks it was A-fib  . Vertigo 08/27/2006    Archie Endo 08/27/2006 (01/27/2013)  . Ventricular hypertrophy     Archie Endo 08/27/2006 (01/27/2013)  . High cholesterol   . Anginal pain   . Exertional shortness of breath     "sometimes" (01/27/2013)  . History of stomach ulcers     "years ago" (528/2014)  . GERD (gastroesophageal reflux disease)   . Arthritis     "q where" (01/27/2013)  . Osteoporosis   . Acute myocardial infarction, unspecified site, initial episode of care   . Acute myocardial infarction of other lateral wall, initial episode of care     Past Surgical History  Procedure Laterality Date  . Hernia repair    . Laparotomy      "day after oophorectomy; had bowel obstruction" (01/27/2013)  . Coronary angioplasty with stent placement  06/2005; 01/27/2013    stent to LAD/notes 08/27/2006; "+ 1 stent today" (01/27/2013)  . Cardiac catheterization  10/2006    Archie Endo 08/03/2007 (01/27/2013)  . Tonsillectomy and adenoidectomy  ~ 1952  . Abdominal hysterectomy  1970's  . Laparoscopic salpingo oopherectomy  2000  . Diagnostic laparoscopy  2000    lap abdominal lysis of adhesions   . Breast lumpectomy Right 1970's    benign   . Breast biopsy Right 1970's    There were no vitals taken for this visit.  Visit Diagnosis:  Difficulty walking  Pain in hip, right      Subjective Assessment - 08/04/14 1121    Symptoms Very sore from the other day!    How long can you stand comfortably? 20 min   How long can you walk comfortably? not limited, per patient, 20 minutes at a time   Currently in Pain? Yes   Pain Score 5    Pain Location Hip   Pain Orientation Right;Left;Proximal   Pain Descriptors / Indicators Sore   Pain Type --  post ex   Multiple Pain Sites No            [facility] Adult PT Treatment/Exercise - 08/04/14 1125    Knee/Hip Exercises: Stretches   Piriformis Stretch 2 reps;30 seconds   Knee/Hip Exercises: Machines for Strengthening   Cybex Knee Extension --  1 plate x10, 2 plates x 10   Cybex Knee Flexion --  2 plates x20   Knee/Hip Exercises: Standing   Forward Lunges 2 sets  pulling freemotion 3 plates   Side Lunges 3 sets   Side Lunges Limitations --  pulling 2 plates each side   Knee/Hip Exercises: Supine   Other  Supine Knee Exercises piriformis knee to opp shoulder   Other Supine Knee Exercises legs crossed lower trunk rot           PT Education - 08/04/14 1203    Education provided Yes   Education Details HEP and Plan for hold , gym exercises   Person(s) Educated Patient   Methods Explanation;Verbal cues   Comprehension Verbalized understanding;Returned demonstration            PT Long Term Goals - 08/04/14 1206    PT LONG TERM GOAL #1   Title Pt. will demo/verbalize techniques to reduce risk of re-injury to include posture, lifting, body mechanics.    Status Achieved   PT LONG TERM GOAL #2   Title Pt will be I with advanced HEP   Status Achieved   PT LONG TERM GOAL #3   Title Pt. will walk in the community without walker for short errands and min pain in hip.   Status Achieved   PT LONG TERM GOAL #4   Title Pt.  will increase hip strength in ABD to 4+/5 to improve gait efficiency   Status Not Met  hip strength 4-/5 (abd)   PT LONG TERM GOAL #5   Title Pt. will return to [facility] at least 1 time per week and report no increase in pain          Plan - 08/04/14 1118    Clinical Impression Statement [name]  She wishes to be on hold for 1 month to see if she can self manage her symptoms and improve independently.    Rehab Potential Excellent   PT Home Exercise Plan continue with hip strength and gave piriformis stretching   Consulted and Agree with Plan of Care Patient          Problem List Patient Active Problem List   Diagnosis Date Noted  . Dyspnea 05/05/2014  . Cough 05/05/2014  . Pulmonary infiltrates 05/04/2014  . CAP (community acquired pneumonia) 05/02/2014  . Atypical face pain 03/24/2014  . Facial numbness 03/24/2014  . Dysuria 01/26/2014  . Atrophic vaginitis 01/26/2014  . Other and unspecified hyperlipidemia 07/23/2013  . Old MI (myocardial infarction)     Class: Chronic  . Abdominal pain 11/13/2011  . Nausea and vomiting 11/13/2011  . Vertigo, benign positional 07/07/2011  . HTN (hypertension), malignant 07/07/2011  . Dizziness 07/07/2011  . CAD (coronary artery disease) 07/07/2011  . A-fib 07/07/2011  . Abdominal pain, lower 07/07/2011  Raeford Razor, PT 08/04/2014 12:08 PM Phone: [phone] Fax: [phone]   [name] 08/04/2014, 12:08 PM

## 2014-08-04 NOTE — Patient Instructions (Signed)
  Hip Stretch  Put right ankle over left knee. Let right knee fall downward, but keep ankle in place. Feel the stretch in hip. May push down gently with hand to feel stretch. Hold __30__ seconds while counting out loud. Repeat with other leg. Repeat ___2-3_ times. Do __1-2__ sessions per day.   Stretching: Piriformis (Supine)  Pull right knee toward opposite shoulder. Hold _30___ seconds. Relax. Repeat __2-3 times per set. Do ____1 sets per session. Do __1-2__ sessions per day.   Copyright  VHI. All rights reserved.  Lie flat, cross Rt. Knee over Lt. Knee and rotate to Lt. Cross Lt. Knee over Rt. Knee and rotate Rt.   Hold 30 sec each

## 2014-08-09 ENCOUNTER — Encounter: Payer: Medicare Other | Admitting: Rehabilitation

## 2014-08-11 ENCOUNTER — Encounter: Payer: Medicare Other | Admitting: Rehabilitation

## 2014-08-11 ENCOUNTER — Encounter (HOSPITAL_COMMUNITY): Payer: Self-pay | Admitting: Interventional Cardiology

## 2014-08-24 ENCOUNTER — Ambulatory Visit (INDEPENDENT_AMBULATORY_CARE_PROVIDER_SITE_OTHER): Payer: Medicare Other | Admitting: Emergency Medicine

## 2014-08-24 VITALS — BP 150/82 | HR 82 | Temp 97.7°F | Resp 16 | Ht 66.0 in | Wt 209.6 lb

## 2014-08-24 DIAGNOSIS — L738 Other specified follicular disorders: Secondary | ICD-10-CM

## 2014-08-24 MED ORDER — PERMETHRIN 5 % EX CREA: 1.0000 "application " | TOPICAL_CREAM | Freq: Once | CUTANEOUS | Status: DC

## 2014-08-24 NOTE — Progress Notes (Signed)
[facility] [address] [place] [address] [phone]  Date:  08/24/2014   Name:  [name]   DOB:  July 09, 1936   MRN:  [id number]  PCP:  [name]., MD    Chief Complaint: Itchy face   History of Present Illness:  [name] is a 78 y.o. very pleasant female patient who presents with the following:  One month duration facial itching. Mostly in eyebrows, under eyes, behind ears and cheeks. Is convinced she has demadex mite infestation Says symptoms are worse after turning off the light at night.   Has not sought medical care No improvement with oral or topical benadryl No new allergen, cnsmetic, or personal product. No improvement with over the counter medications or other home remedies. Denies other complaint or health concern today.   Patient Active Problem List   Diagnosis Date Noted  . Dyspnea 05/05/2014  . Cough 05/05/2014  . Pulmonary infiltrates 05/04/2014  . CAP (community acquired pneumonia) 05/02/2014  . Atypical face pain 03/24/2014  . Facial numbness 03/24/2014  . Dysuria 01/26/2014  . Atrophic vaginitis 01/26/2014  . Other and unspecified hyperlipidemia 07/23/2013  . Old MI (myocardial infarction)     Class: Chronic  . Abdominal pain 11/13/2011  . Nausea and vomiting 11/13/2011  . Vertigo, benign positional 07/07/2011  . HTN (hypertension), malignant 07/07/2011  . Dizziness 07/07/2011  . CAD (coronary artery disease) 07/07/2011  . A-fib 07/07/2011  . Abdominal pain, lower 07/07/2011    Past Medical History  Diagnosis Date  . Coronary artery disease   . Hypertension   . Anxiety   . Fibromyalgia   . Dysrhythmia     history of "irregular heart rate"'; pt. thinks it was A-fib  . Vertigo 08/27/2006    [name] 08/27/2006 (01/27/2013)  . Ventricular hypertrophy     [name] 08/27/2006 (01/27/2013)  . High cholesterol   . Anginal pain   . Exertional shortness of breath     "sometimes"  (01/27/2013)  . History of stomach ulcers     "years ago" (528/2014)  . GERD (gastroesophageal reflux disease)   . Arthritis     "q where" (01/27/2013)  . Osteoporosis   . Acute myocardial infarction, unspecified site, initial episode of care   . Acute myocardial infarction of other lateral wall, initial episode of care     Past Surgical History  Procedure Laterality Date  . Hernia repair    . Laparotomy      "day after oophorectomy; had bowel obstruction" (01/27/2013)  . Coronary angioplasty with stent placement  06/2005; 01/27/2013    stent to LAD/notes 08/27/2006; "+ 1 stent today" (01/27/2013)  . Cardiac catheterization  10/2006    [name] 08/03/2007 (01/27/2013)  . Tonsillectomy and adenoidectomy  ~ 1952  . Abdominal hysterectomy  1970's  . Laparoscopic salpingo oopherectomy  2000  . Diagnostic laparoscopy  2000    lap abdominal lysis of adhesions  . Breast lumpectomy Right 1970's    benign   . Breast biopsy Right 1970's  . Left heart catheterization with coronary angiogram N/A 01/27/2013    Procedure: LEFT HEART CATHETERIZATION WITH CORONARY ANGIOGRAM;  Surgeon: Corky Crafts, MD;  Location: [facility] CATH LAB;  Service: Cardiovascular;  Laterality: N/A;    History  Substance Use Topics  . Smoking status: Former Smoker -- 0.12 packs/day for 36 years    Types: Cigarettes    Quit date: 12/02/1991  . Smokeless tobacco: Never Used  . Alcohol Use:  No    Family History  Problem Relation Age of Onset  . Other Mother   . Other Father     Allergies  Allergen Reactions  . Codeine Itching    Tolerates low dose of norco    Medication list has been reviewed and updated.  Current Outpatient Prescriptions on File Prior to Visit  Medication Sig Dispense Refill  . acetaminophen (TYLENOL) 500 MG tablet Take 500 mg by mouth daily as needed (pain).    [name]. albuterol (PROVENTIL HFA;VENTOLIN HFA) 108 (90 BASE) MCG/ACT inhaler Inhale 2 puffs into the lungs every 6 (six) hours as needed for  wheezing. 1 Inhaler 3  . ALPRAZolam (XANAX) 0.25 MG tablet Take 0.25 mg by mouth 2 (two) times daily. anxiety    . aspirin EC 81 MG tablet Take 81 mg by mouth daily.      . clopidogrel (PLAVIX) 75 MG tablet Take 75 mg by mouth daily.    [name]. conjugated estrogens (PREMARIN) vaginal cream As needed.    . fluticasone (FLONASE) 50 MCG/ACT nasal spray Place 2 sprays into both nostrils daily. 1 g 2  . furosemide (LASIX) 20 MG tablet Take 10 mg by mouth daily.     [name]. gabapentin (NEURONTIN) 300 MG capsule Take 1 capsule (300 mg total) by mouth 3 (three) times daily. 90 capsule 3  . HYDROcodone-acetaminophen (NORCO/VICODIN) 5-325 MG per tablet Take 0.5-1 tablets by mouth daily as needed for moderate pain. 30 tablet 0  . loratadine (CLARITIN) 10 MG tablet Take 10 mg by mouth daily.     . Nepafenac (ILEVRO) 0.3 % SUSP Apply 1 drop to eye See admin instructions. Instill 1 drop in left eye daily for 3 more days (until 05/05/14), instill 1 drop in right eye daily for 10 days starting 05/01/14    . nitroGLYCERIN (NITROSTAT) 0.4 MG SL tablet Place 1 tablet (0.4 mg total) under the tongue every 5 (five) minutes as needed for chest pain (CP or SOB). 25 tablet 12  . pantoprazole (PROTONIX) 40 MG tablet Take 1 tablet (40 mg total) by mouth daily at 12 noon. 21 tablet 0  . potassium chloride SA (K-DUR,KLOR-CON) 20 MEQ tablet Take 20 mEq by mouth daily.    . valsartan-hydrochlorothiazide (DIOVAN-HCT) 160-12.5 MG per tablet Take 1 tablet by mouth daily.     [name]. zolpidem (AMBIEN) 10 MG tablet Take 5 mg by mouth daily as needed for sleep.     . [DISCONTINUED] ezetimibe (ZETIA) 10 MG tablet Take 10 mg by mouth daily.      . [DISCONTINUED] metoprolol (TOPROL-XL) 50 MG 24 hr tablet Take 50 mg by mouth daily.      . [DISCONTINUED] potassium chloride (KLOR-CON) 20 MEQ packet Take 20 mEq by mouth daily.       No current facility-administered medications on file prior to visit.    Review of Systems:  As per HPI, otherwise negative.     Physical Examination: Filed Vitals:   08/24/14 1451  BP: 150/82  Pulse: 82  Temp: 97.7 F (36.5 C)  Resp: 16   Filed Vitals:   08/24/14 1451  Height: 5\' 6"  (1.676 m)  Weight: 209 lb 9.6 oz (95.074 kg)   Body mass index is 33.85 kg/(m^2). Ideal Body Weight: Weight in (lb) to have BMI = 25: 154.6   GEN: WDWN, NAD, Non-toxic, Alert & Oriented x 3 HEENT: Atraumatic, Normocephalic.  Ears and Nose: No external deformity. EXTR: No clubbing/cyanosis/edema NEURO: Normal gait.  PSYCH: Normally interactive. Conversant. Not  depressed or anxious appearing.  Calm demeanor.    Assessment and Plan: demedex vs rosacea permethrin Follow up with derm if not improved  Signed,  Phillips OdorJeffery Jayquan Bradsher, MD

## 2014-08-25 ENCOUNTER — Encounter: Payer: Self-pay | Admitting: Physical Therapy

## 2014-08-25 NOTE — Therapy (Signed)
[place], [place], [address] Phone: [phone]   Fax:  [phone]  Patient Details  Name: [name] MRN: [id number] Date of Birth: 19-May-1936  Encounter Date: 08/25/2014 PT LONG TERM GOAL #1     Title  Pt. will demo/verbalize techniques to reduce risk of re-injury to include posture, lifting, body mechanics.     Status  Achieved    PT LONG TERM GOAL #2    Title  Pt will be I with advanced HEP    Status  Achieved    PT LONG TERM GOAL #3    Title  Pt. will walk in the community without walker for short errands and min pain in hip.    Status  Achieved    PT LONG TERM GOAL #4    Title  Pt. will increase hip strength in ABD to 4+/5 to improve gait efficiency    Status  Not Met      hip strength 4-/5 (abd)    PT LONG TERM GOAL #5    Title  Pt. will return to [facility] at least 1 time per week and report no increase in pain              Unknown PHYSICAL THERAPY DISCHARGE SUMMARY  Visits from Start of Care:   Current functional level related to goals / functional outcomes: See above for goals   Remaining deficits: None limiting, patient still had min hip abd weakness.  She was placed on hold to see if she could do exercises at the [facility] without pain flare up.  She was asked to call and schedule more appts if she thought she needed to return to PT. did not return my email so I assume she is ready for discharge.     Education / Equipment: HEP, posture, gait  Plan: Patient agrees to discharge.  Patient goals were partially met. Patient is being discharged due to being pleased with the current functional level.  ?????    Raeford Razor, PT 08/25/2014 8:48 AM Phone: [phone] Fax: [phone]     PAA,JENNIFER 08/25/2014, 8:46 AM  [place], [place], [address] Phone: [phone]   Fax:   [phone]

## 2014-09-25 ENCOUNTER — Emergency Department (HOSPITAL_COMMUNITY): Payer: Medicare Other

## 2014-09-25 ENCOUNTER — Emergency Department (HOSPITAL_COMMUNITY)
Admission: EM | Admit: 2014-09-25 | Discharge: 2014-09-25 | Disposition: A | Discharge status: Home / Self Care | Payer: Medicare Other | Attending: Emergency Medicine | Admitting: Emergency Medicine

## 2014-09-25 ENCOUNTER — Encounter (HOSPITAL_COMMUNITY): Payer: Self-pay | Admitting: Family Medicine

## 2014-09-25 DIAGNOSIS — J189 Pneumonia, unspecified organism: Secondary | ICD-10-CM

## 2014-09-25 DIAGNOSIS — M1990 Unspecified osteoarthritis, unspecified site: Secondary | ICD-10-CM | POA: Diagnosis not present

## 2014-09-25 DIAGNOSIS — K219 Gastro-esophageal reflux disease without esophagitis: Secondary | ICD-10-CM | POA: Diagnosis not present

## 2014-09-25 DIAGNOSIS — Z7902 Long term (current) use of antithrombotics/antiplatelets: Secondary | ICD-10-CM | POA: Insufficient documentation

## 2014-09-25 DIAGNOSIS — R05 Cough: Secondary | ICD-10-CM | POA: Diagnosis present

## 2014-09-25 DIAGNOSIS — I252 Old myocardial infarction: Secondary | ICD-10-CM | POA: Insufficient documentation

## 2014-09-25 DIAGNOSIS — K259 Gastric ulcer, unspecified as acute or chronic, without hemorrhage or perforation: Secondary | ICD-10-CM | POA: Insufficient documentation

## 2014-09-25 DIAGNOSIS — Z8639 Personal history of other endocrine, nutritional and metabolic disease: Secondary | ICD-10-CM | POA: Diagnosis not present

## 2014-09-25 DIAGNOSIS — Z7951 Long term (current) use of inhaled steroids: Secondary | ICD-10-CM | POA: Diagnosis not present

## 2014-09-25 DIAGNOSIS — J159 Unspecified bacterial pneumonia: Secondary | ICD-10-CM | POA: Insufficient documentation

## 2014-09-25 DIAGNOSIS — F419 Anxiety disorder, unspecified: Secondary | ICD-10-CM | POA: Diagnosis not present

## 2014-09-25 DIAGNOSIS — Z79899 Other long term (current) drug therapy: Secondary | ICD-10-CM | POA: Insufficient documentation

## 2014-09-25 DIAGNOSIS — I25110 Atherosclerotic heart disease of native coronary artery with unstable angina pectoris: Secondary | ICD-10-CM | POA: Diagnosis not present

## 2014-09-25 DIAGNOSIS — Z7982 Long term (current) use of aspirin: Secondary | ICD-10-CM | POA: Diagnosis not present

## 2014-09-25 DIAGNOSIS — M797 Fibromyalgia: Secondary | ICD-10-CM | POA: Diagnosis not present

## 2014-09-25 DIAGNOSIS — Z87891 Personal history of nicotine dependence: Secondary | ICD-10-CM | POA: Insufficient documentation

## 2014-09-25 DIAGNOSIS — R059 Cough, unspecified: Secondary | ICD-10-CM

## 2014-09-25 DIAGNOSIS — I10 Essential (primary) hypertension: Secondary | ICD-10-CM | POA: Diagnosis not present

## 2014-09-25 LAB — COMPREHENSIVE METABOLIC PANEL
ALBUMIN: 3.7 g/dL (ref 3.5–5.2)
ALK PHOS: 89 U/L (ref 39–117)
ALT: 18 U/L (ref 0–35)
AST: 26 U/L (ref 0–37)
Anion gap: 5 (ref 5–15)
BILIRUBIN TOTAL: 0.3 mg/dL (ref 0.3–1.2)
BUN: 6 mg/dL (ref 6–23)
CALCIUM: 9.1 mg/dL (ref 8.4–10.5)
CO2: 30 mmol/L (ref 19–32)
CREATININE: 0.89 mg/dL (ref 0.50–1.10)
Chloride: 101 mmol/L (ref 96–112)
GFR calc Af Amer: 71 mL/min — ABNORMAL LOW (ref >90)
GFR, EST NON AFRICAN AMERICAN: 61 mL/min — AB (ref >90)
Glucose, Bld: 115 mg/dL — ABNORMAL HIGH (ref 70–99)
Potassium: 3.7 mmol/L (ref 3.5–5.1)
SODIUM: 136 mmol/L (ref 135–145)
Total Protein: 7.1 g/dL (ref 6.0–8.3)

## 2014-09-25 LAB — CBC WITH DIFFERENTIAL/PLATELET
BASOS PCT: 0 % (ref 0–1)
Basophils Absolute: 0 10*3/uL (ref 0.0–0.1)
EOS PCT: 1 % (ref 0–5)
Eosinophils Absolute: 0.1 10*3/uL (ref 0.0–0.7)
HCT: 39.4 % (ref 36.0–46.0)
Hemoglobin: 12.2 g/dL (ref 12.0–15.0)
LYMPHS ABS: 1.3 10*3/uL (ref 0.7–4.0)
Lymphocytes Relative: 29 % (ref 12–46)
MCH: 25.5 pg — ABNORMAL LOW (ref 26.0–34.0)
MCHC: 31 g/dL (ref 30.0–36.0)
MCV: 82.4 fL (ref 78.0–100.0)
Monocytes Absolute: 0.3 10*3/uL (ref 0.1–1.0)
Monocytes Relative: 6 % (ref 3–12)
NEUTROS PCT: 64 % (ref 43–77)
Neutro Abs: 3 10*3/uL (ref 1.7–7.7)
Platelets: 199 10*3/uL (ref 150–400)
RBC: 4.78 MIL/uL (ref 3.87–5.11)
RDW: 14.6 % (ref 11.5–15.5)
WBC: 4.7 10*3/uL (ref 4.0–10.5)

## 2014-09-25 LAB — BRAIN NATRIURETIC PEPTIDE: B Natriuretic Peptide: 46.8 pg/mL (ref 0.0–100.0)

## 2014-09-25 MED ORDER — IPRATROPIUM-ALBUTEROL 0.5-2.5 (3) MG/3ML IN SOLN
3.0000 mL | Freq: Once | RESPIRATORY_TRACT | Status: AC
  Administered 2014-09-25: 3 mL via RESPIRATORY_TRACT
  Filled 2014-09-25: qty 3

## 2014-09-25 MED ORDER — AZITHROMYCIN 250 MG PO TABS: ORAL_TABLET | ORAL | Status: DC

## 2014-09-25 MED ORDER — FENTANYL CITRATE 0.05 MG/ML IJ SOLN
50.0000 ug | Freq: Once | INTRAMUSCULAR | Status: AC
  Administered 2014-09-25: 50 ug via INTRAVENOUS
  Filled 2014-09-25: qty 2

## 2014-09-25 NOTE — ED Notes (Signed)
Pt remains monitored by blood pressure and pulse ox. pts family remains at bedside.  

## 2014-09-25 NOTE — ED Provider Notes (Signed)
CSN: [id number]     Arrival date & time 09/25/14  1237 History   First MD Initiated Contact with Patient 09/25/14 1350     Chief Complaint  Patient presents with  . Cough     (Consider location/radiation/quality/duration/timing/severity/associated sxs/prior Treatment) HPI  79 year old female presents with cough, congestion, and shortness of breath since last night. Reports shortness of breath is mild. Has been coughing all night and all day. Mildly productive. Has not had any fevers but has had some chills. Mild muscle aches. Little bit sore throat that she thinks is from the coughing. This feels similar to when she had pneumonia in August 2015. Patient denies any prior history of COPD. She does have an albuterol nebulizer at home, tried this once without any relief. Does have chest tightness. Since this started she has also noticed that she has increased dyspnea with lying flat.  Past Medical History  Diagnosis Date  . Coronary artery disease   . Hypertension   . Anxiety   . Fibromyalgia   . Dysrhythmia     history of "irregular heart rate"'; pt. thinks it was A-fib  . Vertigo 08/27/2006    [name]/notes 08/27/2006 (01/27/2013)  . Ventricular hypertrophy     [name]/notes 08/27/2006 (01/27/2013)  . High cholesterol   . Anginal pain   . Exertional shortness of breath     "sometimes" (01/27/2013)  . History of stomach ulcers     "years ago" (528/2014)  . GERD (gastroesophageal reflux disease)   . Arthritis     "q where" (01/27/2013)  . Osteoporosis   . Acute myocardial infarction, unspecified site, initial episode of care   . Acute myocardial infarction of other lateral wall, initial episode of care    Past Surgical History  Procedure Laterality Date  . Hernia repair    . Laparotomy      "day after oophorectomy; had bowel obstruction" (01/27/2013)  . Coronary angioplasty with stent placement  06/2005; 01/27/2013    stent to LAD/notes 08/27/2006; "+ 1 stent today" (01/27/2013)  . Cardiac  catheterization  10/2006    [name]/notes 08/03/2007 (01/27/2013)  . Tonsillectomy and adenoidectomy  ~ 1952  . Abdominal hysterectomy  1970's  . Laparoscopic salpingo oopherectomy  2000  . Diagnostic laparoscopy  2000    lap abdominal lysis of adhesions  . Breast lumpectomy Right 1970's    benign   . Breast biopsy Right 1970's  . Left heart catheterization with coronary angiogram N/A 01/27/2013    Procedure: LEFT HEART CATHETERIZATION WITH CORONARY ANGIOGRAM;  Surgeon: Corky CraftsJayadeep S Varanasi, MD;  Location: [facility] C[facility] LAB;  Service: Cardiovascular;  Laterality: N/A;   Family History  Problem Relation Age of Onset  . Other Mother   . Other Father    History  Substance Use Topics  . Smoking status: Former Smoker -- 0.12 packs/day for 36 years    Types: Cigarettes    Quit date: 12/02/1991  . Smokeless tobacco: Never Used  . Alcohol Use: No   OB History    No data available     Review of Systems  Constitutional: Positive for chills. Negative for fever.  HENT: Positive for congestion.   Respiratory: Positive for cough, chest tightness, shortness of breath and wheezing.   Cardiovascular: Negative for chest pain.  Gastrointestinal: Negative for vomiting.  Musculoskeletal: Positive for myalgias.  Neurological: Positive for headaches.  All other systems reviewed and are negative.     Allergies  Codeine  Home Medications   Prior to Admission medications  Medication Sig Start Date End Date Taking? Authorizing Provider  acetaminophen (TYLENOL) 500 MG tablet Take 500 mg by mouth daily as needed (pain).    Historical Provider, MD  albuterol (PROVENTIL HFA;VENTOLIN HFA) 108 (90 BASE) MCG/ACT inhaler Inhale 2 puffs into the lungs every 6 (six) hours as needed for wheezing. 05/23/14   Kalman Shan, MD  ALPRAZolam Prudy Feeler) 0.25 MG tablet Take 0.25 mg by mouth 2 (two) times daily. anxiety    Historical Provider, MD  aspirin EC 81 MG tablet Take 81 mg by mouth daily.      Historical Provider, MD   clopidogrel (PLAVIX) 75 MG tablet Take 75 mg by mouth daily.    Historical Provider, MD  conjugated estrogens (PREMARIN) vaginal cream As needed. 02/07/14   Historical Provider, MD  fluticasone (FLONASE) 50 MCG/ACT nasal spray Place 2 sprays into both nostrils daily. 05/06/14   Esperanza Sheets, MD  furosemide (LASIX) 20 MG tablet Take 10 mg by mouth daily.     Historical Provider, MD  gabapentin (NEURONTIN) 300 MG capsule Take 1 capsule (300 mg total) by mouth 3 (three) times daily. 06/28/14   Delia Heady, MD  HYDROcodone-acetaminophen (NORCO/VICODIN) 5-325 MG per tablet Take 0.5-1 tablets by mouth daily as needed for moderate pain. 05/06/14   Esperanza Sheets, MD  loratadine (CLARITIN) 10 MG tablet Take 10 mg by mouth daily.     Historical Provider, MD  Nepafenac (ILEVRO) 0.3 % SUSP Apply 1 drop to eye See admin instructions. Instill 1 drop in left eye daily for 3 more days (until 05/05/14), instill 1 drop in right eye daily for 10 days starting 05/01/14    Historical Provider, MD  nitroGLYCERIN (NITROSTAT) 0.4 MG SL tablet Place 1 tablet (0.4 mg total) under the tongue every 5 (five) minutes as needed for chest pain (CP or SOB). 01/28/13   Lyn Records III, MD  pantoprazole (PROTONIX) 40 MG tablet Take 1 tablet (40 mg total) by mouth daily at 12 noon. 05/06/14   Esperanza Sheets, MD  permethrin (ACTICIN) 5 % cream Apply 1 application topically once. Apply to face and then rest 8-14 hours before washing off 08/24/14   Carmelina Dane, MD  potassium chloride SA (K-DUR,KLOR-CON) 20 MEQ tablet Take 20 mEq by mouth daily.    Historical Provider, MD  valsartan-hydrochlorothiazide (DIOVAN-HCT) 160-12.5 MG per tablet Take 1 tablet by mouth daily.  07/05/13   Historical Provider, MD  zolpidem (AMBIEN) 10 MG tablet Take 5 mg by mouth daily as needed for sleep.     Historical Provider, MD   BP 148/57 mmHg  Pulse 90  Temp(Src) 98.7 F (37.1 C)  Resp 18  SpO2 100% Physical Exam  Constitutional: She is oriented  to person, place, and time. She appears well-developed and well-nourished. No distress.  HENT:  Head: Normocephalic and atraumatic.  Right Ear: External ear normal.  Left Ear: External ear normal.  Nose: Nose normal.  Eyes: Right eye exhibits no discharge. Left eye exhibits no discharge.  Cardiovascular: Normal rate, regular rhythm and normal heart sounds.   Pulmonary/Chest: Effort normal and breath sounds normal. No respiratory distress. She has no wheezes. She has no rales.  Speaks in complete sentences  Abdominal: Soft. She exhibits no distension. There is no tenderness.  Musculoskeletal: She exhibits no edema.  Neurological: She is alert and oriented to person, place, and time.  Skin: Skin is warm and dry. She is not diaphoretic.  Nursing note and vitals reviewed.   ED  Course  Procedures (including critical care time) Labs Review Labs Reviewed  COMPREHENSIVE METABOLIC PANEL - Abnormal; Notable for the following:    Glucose, Bld 115 (*)    GFR calc non Af Amer 61 (*)    GFR calc Af Amer 71 (*)    All other components within normal limits  CBC WITH DIFFERENTIAL/PLATELET - Abnormal; Notable for the following:    MCH 25.5 (*)    All other components within normal limits  BRAIN NATRIURETIC PEPTIDE    Imaging Review Dg Chest 2 View  09/25/2014   CLINICAL DATA:  Fever, cough starting yesterday  EXAM: CHEST  2 VIEW  COMPARISON:  07/25/2014  FINDINGS: Cardiomediastinal silhouette is stable. There is streaky atelectasis or infiltrate in left base. Mild degenerative changes thoracic spine. No pulmonary edema.  IMPRESSION: Streaky atelectasis or infiltrate left base. No pulmonary edema. Mild degenerative changes thoracic spine.   Electronically Signed   By: Natasha Mead M.D.   On: 09/25/2014 13:38     EKG Interpretation   Date/Time:  Sunday September 25 2014 15:22:40 EST Ventricular Rate:  90 PR Interval:  130 QRS Duration: 86 QT Interval:  472 QTC Calculation: 578 R Axis:    76 Text Interpretation:  Sinus rhythm Nonspecific T abnrm, anterolateral  leads Prolonged QT interval no significant change from Aug 2015 Confirmed  by Dina Mobley  MD, Taje Littler ([id number]) on 09/25/2014 5:59:17 PM      MDM   Final diagnoses:  CAP (community acquired pneumonia)    Patient's cough is likely mild CAP given mild changes on CXR, no increase WOB and benign labs. Ambulates without increased WOB, and sats remain above 92%. Feels better after a neb. Has nonspecific T waves but her chest tightness seems consistent with the pneumonia given infectious symptoms and not ACS. I feel she is stable to try outpatient antibiotics and follow up with PCP. Discussed strict return precautions.     Audree Camel, MD 09/25/14 [phone]

## 2014-09-25 NOTE — ED Notes (Signed)
Pt here for cough that started yesterday. sts coughing up phlegm and she cough so hard she vomits at times.

## 2014-09-25 NOTE — ED Notes (Addendum)
PT reports cough that is mostly dry but productive at times. Cardiac history. Cough since yesterday. Pt reports having to sleep elevated up with pillows.

## 2014-09-25 NOTE — ED Notes (Signed)
Sats stayed between 93% and 98% while ambulating.

## 2014-09-25 NOTE — ED Notes (Signed)
MD Goldston at bedside  

## 2014-11-08 ENCOUNTER — Encounter (HOSPITAL_COMMUNITY): Payer: Self-pay | Admitting: Emergency Medicine

## 2014-11-08 ENCOUNTER — Emergency Department (HOSPITAL_COMMUNITY): Payer: Medicare Other

## 2014-11-08 ENCOUNTER — Inpatient Hospital Stay (HOSPITAL_COMMUNITY)
Admission: EM | Admit: 2014-11-08 | Discharge: 2014-11-12 | DRG: 247 | Disposition: A | Discharge status: Home / Self Care | Payer: Medicare Other | Attending: Internal Medicine | Admitting: Internal Medicine

## 2014-11-08 DIAGNOSIS — E669 Obesity, unspecified: Secondary | ICD-10-CM | POA: Diagnosis present

## 2014-11-08 DIAGNOSIS — K219 Gastro-esophageal reflux disease without esophagitis: Secondary | ICD-10-CM | POA: Diagnosis present

## 2014-11-08 DIAGNOSIS — R1310 Dysphagia, unspecified: Secondary | ICD-10-CM

## 2014-11-08 DIAGNOSIS — I10 Essential (primary) hypertension: Secondary | ICD-10-CM | POA: Diagnosis present

## 2014-11-08 DIAGNOSIS — E780 Pure hypercholesterolemia: Secondary | ICD-10-CM | POA: Diagnosis present

## 2014-11-08 DIAGNOSIS — M797 Fibromyalgia: Secondary | ICD-10-CM | POA: Diagnosis present

## 2014-11-08 DIAGNOSIS — Z91048 Other nonmedicinal substance allergy status: Secondary | ICD-10-CM

## 2014-11-08 DIAGNOSIS — K224 Dyskinesia of esophagus: Secondary | ICD-10-CM | POA: Diagnosis present

## 2014-11-08 DIAGNOSIS — Z955 Presence of coronary angioplasty implant and graft: Secondary | ICD-10-CM

## 2014-11-08 DIAGNOSIS — R109 Unspecified abdominal pain: Secondary | ICD-10-CM

## 2014-11-08 DIAGNOSIS — Z87891 Personal history of nicotine dependence: Secondary | ICD-10-CM

## 2014-11-08 DIAGNOSIS — K449 Diaphragmatic hernia without obstruction or gangrene: Secondary | ICD-10-CM | POA: Diagnosis present

## 2014-11-08 DIAGNOSIS — T82858A Stenosis of vascular prosthetic devices, implants and grafts, initial encounter: Secondary | ICD-10-CM | POA: Diagnosis present

## 2014-11-08 DIAGNOSIS — I2510 Atherosclerotic heart disease of native coronary artery without angina pectoris: Principal | ICD-10-CM | POA: Diagnosis present

## 2014-11-08 DIAGNOSIS — I25119 Atherosclerotic heart disease of native coronary artery with unspecified angina pectoris: Secondary | ICD-10-CM | POA: Diagnosis present

## 2014-11-08 DIAGNOSIS — R079 Chest pain, unspecified: Secondary | ICD-10-CM | POA: Diagnosis not present

## 2014-11-08 DIAGNOSIS — M810 Age-related osteoporosis without current pathological fracture: Secondary | ICD-10-CM | POA: Diagnosis present

## 2014-11-08 DIAGNOSIS — Z6834 Body mass index (BMI) 34.0-34.9, adult: Secondary | ICD-10-CM

## 2014-11-08 DIAGNOSIS — Y831 Surgical operation with implant of artificial internal device as the cause of abnormal reaction of the patient, or of later complication, without mention of misadventure at the time of the procedure: Secondary | ICD-10-CM | POA: Diagnosis present

## 2014-11-08 DIAGNOSIS — I471 Supraventricular tachycardia: Secondary | ICD-10-CM | POA: Diagnosis not present

## 2014-11-08 DIAGNOSIS — I5032 Chronic diastolic (congestive) heart failure: Secondary | ICD-10-CM | POA: Diagnosis present

## 2014-11-08 DIAGNOSIS — Z7982 Long term (current) use of aspirin: Secondary | ICD-10-CM

## 2014-11-08 DIAGNOSIS — R52 Pain, unspecified: Secondary | ICD-10-CM

## 2014-11-08 DIAGNOSIS — I252 Old myocardial infarction: Secondary | ICD-10-CM

## 2014-11-08 DIAGNOSIS — Z885 Allergy status to narcotic agent status: Secondary | ICD-10-CM

## 2014-11-08 DIAGNOSIS — M25511 Pain in right shoulder: Secondary | ICD-10-CM | POA: Diagnosis present

## 2014-11-08 DIAGNOSIS — Z7902 Long term (current) use of antithrombotics/antiplatelets: Secondary | ICD-10-CM

## 2014-11-08 DIAGNOSIS — R102 Pelvic and perineal pain: Secondary | ICD-10-CM

## 2014-11-08 DIAGNOSIS — F419 Anxiety disorder, unspecified: Secondary | ICD-10-CM | POA: Diagnosis present

## 2014-11-08 DIAGNOSIS — K5900 Constipation, unspecified: Secondary | ICD-10-CM | POA: Diagnosis not present

## 2014-11-08 DIAGNOSIS — K222 Esophageal obstruction: Secondary | ICD-10-CM

## 2014-11-08 LAB — CBC WITH DIFFERENTIAL/PLATELET
BASOS ABS: 0.1 10*3/uL (ref 0.0–0.1)
Basophils Relative: 1 % (ref 0–1)
EOS PCT: 4 % (ref 0–5)
Eosinophils Absolute: 0.3 10*3/uL (ref 0.0–0.7)
HCT: 36.9 % (ref 36.0–46.0)
Hemoglobin: 12 g/dL (ref 12.0–15.0)
LYMPHS PCT: 38 % (ref 12–46)
Lymphs Abs: 2.5 10*3/uL (ref 0.7–4.0)
MCH: 26.6 pg (ref 26.0–34.0)
MCHC: 32.5 g/dL (ref 30.0–36.0)
MCV: 81.8 fL (ref 78.0–100.0)
Monocytes Absolute: 0.3 10*3/uL (ref 0.1–1.0)
Monocytes Relative: 4 % (ref 3–12)
NEUTROS PCT: 53 % (ref 43–77)
Neutro Abs: 3.5 10*3/uL (ref 1.7–7.7)
PLATELETS: 282 10*3/uL (ref 150–400)
RBC: 4.51 MIL/uL (ref 3.87–5.11)
RDW: 15.2 % (ref 11.5–15.5)
WBC: 6.7 10*3/uL (ref 4.0–10.5)

## 2014-11-08 LAB — I-STAT CHEM 8, ED
BUN: 14 mg/dL (ref 6–23)
CHLORIDE: 99 mmol/L (ref 96–112)
Calcium, Ion: 1.16 mmol/L (ref 1.13–1.30)
Creatinine, Ser: 0.9 mg/dL (ref 0.50–1.10)
Glucose, Bld: 153 mg/dL — ABNORMAL HIGH (ref 70–99)
HCT: 40 % (ref 36.0–46.0)
Hemoglobin: 13.6 g/dL (ref 12.0–15.0)
POTASSIUM: 3.6 mmol/L (ref 3.5–5.1)
SODIUM: 141 mmol/L (ref 135–145)
TCO2: 26 mmol/L (ref 0–100)

## 2014-11-08 LAB — CBC
HEMATOCRIT: 35.5 % — AB (ref 36.0–46.0)
HEMOGLOBIN: 11.3 g/dL — AB (ref 12.0–15.0)
MCH: 25.8 pg — ABNORMAL LOW (ref 26.0–34.0)
MCHC: 31.8 g/dL (ref 30.0–36.0)
MCV: 81.1 fL (ref 78.0–100.0)
Platelets: 248 10*3/uL (ref 150–400)
RBC: 4.38 MIL/uL (ref 3.87–5.11)
RDW: 15.2 % (ref 11.5–15.5)
WBC: 6.5 10*3/uL (ref 4.0–10.5)

## 2014-11-08 LAB — I-STAT TROPONIN, ED: TROPONIN I, POC: 0 ng/mL (ref 0.00–0.08)

## 2014-11-08 MED ORDER — SODIUM CHLORIDE 0.9 % IJ SOLN
3.0000 mL | Freq: Two times a day (BID) | INTRAMUSCULAR | Status: DC
Start: 2014-11-08 — End: 2014-11-12
  Administered 2014-11-09 – 2014-11-11 (×5): 3 mL via INTRAVENOUS

## 2014-11-08 MED ORDER — ALBUTEROL SULFATE HFA 108 (90 BASE) MCG/ACT IN AERS: 2.0000 | INHALATION_SPRAY | Freq: Four times a day (QID) | RESPIRATORY_TRACT | Status: DC | PRN

## 2014-11-08 MED ORDER — MORPHINE SULFATE 4 MG/ML IJ SOLN
4.0000 mg | Freq: Once | INTRAMUSCULAR | Status: AC
  Administered 2014-11-08: 4 mg via INTRAVENOUS
  Filled 2014-11-08: qty 1

## 2014-11-08 MED ORDER — NITROGLYCERIN 0.4 MG SL SUBL
0.4000 mg | SUBLINGUAL_TABLET | SUBLINGUAL | Status: DC | PRN
  Administered 2014-11-09: 0.4 mg via SUBLINGUAL

## 2014-11-08 MED ORDER — HYDROCHLOROTHIAZIDE 12.5 MG PO CAPS
12.5000 mg | ORAL_CAPSULE | Freq: Every day | ORAL | Status: DC
  Filled 2014-11-08: qty 1

## 2014-11-08 MED ORDER — GABAPENTIN 300 MG PO CAPS: 300.0000 mg | ORAL_CAPSULE | Freq: Three times a day (TID) | ORAL | Status: DC

## 2014-11-08 MED ORDER — FUROSEMIDE 20 MG PO TABS
10.0000 mg | ORAL_TABLET | Freq: Every day | ORAL | Status: DC
  Administered 2014-11-09 – 2014-11-12 (×4): 10 mg via ORAL
  Filled 2014-11-08 (×4): qty 0.5

## 2014-11-08 MED ORDER — VALSARTAN-HYDROCHLOROTHIAZIDE 160-12.5 MG PO TABS: 1.0000 | ORAL_TABLET | Freq: Every day | ORAL | Status: DC

## 2014-11-08 MED ORDER — NITROGLYCERIN 0.4 MG SL SUBL
0.4000 mg | SUBLINGUAL_TABLET | SUBLINGUAL | Status: DC | PRN
  Administered 2014-11-08: 0.4 mg via SUBLINGUAL
  Filled 2014-11-08: qty 1

## 2014-11-08 MED ORDER — PANTOPRAZOLE SODIUM 40 MG PO TBEC: 40.0000 mg | DELAYED_RELEASE_TABLET | Freq: Every day | ORAL | Status: DC

## 2014-11-08 MED ORDER — IRBESARTAN 150 MG PO TABS
150.0000 mg | ORAL_TABLET | Freq: Every day | ORAL | Status: DC
  Filled 2014-11-08: qty 1

## 2014-11-08 MED ORDER — ACETAMINOPHEN 500 MG PO TABS: 500.0000 mg | ORAL_TABLET | Freq: Every day | ORAL | Status: DC | PRN

## 2014-11-08 MED ORDER — MORPHINE SULFATE 2 MG/ML IJ SOLN
1.0000 mg | INTRAMUSCULAR | Status: DC | PRN
  Administered 2014-11-09 – 2014-11-11 (×7): 2 mg via INTRAVENOUS
  Filled 2014-11-08 (×6): qty 1

## 2014-11-08 MED ORDER — POTASSIUM CHLORIDE CRYS ER 20 MEQ PO TBCR
20.0000 meq | EXTENDED_RELEASE_TABLET | Freq: Every day | ORAL | Status: DC
  Administered 2014-11-09 – 2014-11-12 (×4): 20 meq via ORAL
  Filled 2014-11-08 (×5): qty 1

## 2014-11-08 MED ORDER — ALPRAZOLAM 0.25 MG PO TABS
0.2500 mg | ORAL_TABLET | Freq: Two times a day (BID) | ORAL | Status: DC
  Administered 2014-11-09 – 2014-11-12 (×8): 0.25 mg via ORAL
  Filled 2014-11-08 (×8): qty 1

## 2014-11-08 MED ORDER — ASPIRIN 325 MG PO TABS
325.0000 mg | ORAL_TABLET | Freq: Every day | ORAL | Status: DC
  Administered 2014-11-09 – 2014-11-10 (×2): 325 mg via ORAL
  Filled 2014-11-08 (×2): qty 1

## 2014-11-08 MED ORDER — HYDROCODONE-ACETAMINOPHEN 5-325 MG PO TABS
0.5000 | ORAL_TABLET | Freq: Every day | ORAL | Status: DC | PRN
  Administered 2014-11-09 – 2014-11-11 (×2): 1 via ORAL
  Filled 2014-11-08 (×2): qty 1

## 2014-11-08 MED ORDER — ALBUTEROL SULFATE (2.5 MG/3ML) 0.083% IN NEBU
2.5000 mg | INHALATION_SOLUTION | Freq: Four times a day (QID) | RESPIRATORY_TRACT | Status: DC | PRN
  Filled 2014-11-08: qty 3

## 2014-11-08 MED ORDER — HEPARIN SODIUM (PORCINE) 5000 UNIT/ML IJ SOLN
5000.0000 [IU] | Freq: Three times a day (TID) | INTRAMUSCULAR | Status: DC
  Administered 2014-11-09 (×3): 5000 [IU] via SUBCUTANEOUS
  Filled 2014-11-08 (×4): qty 1

## 2014-11-08 MED ORDER — ZOLPIDEM TARTRATE 5 MG PO TABS: 5.0000 mg | ORAL_TABLET | Freq: Every day | ORAL | Status: DC | PRN

## 2014-11-08 MED ORDER — CLOPIDOGREL BISULFATE 75 MG PO TABS
75.0000 mg | ORAL_TABLET | Freq: Every day | ORAL | Status: DC
  Administered 2014-11-09 – 2014-11-10 (×2): 75 mg via ORAL
  Filled 2014-11-08 (×2): qty 1

## 2014-11-08 MED ORDER — SODIUM CHLORIDE 0.9 % IV SOLN
INTRAVENOUS | Status: DC
  Administered 2014-11-09: 01:00:00 via INTRAVENOUS

## 2014-11-08 MED ORDER — NITROGLYCERIN 0.3 MG/HR TD PT24
0.3000 mg | MEDICATED_PATCH | Freq: Every day | TRANSDERMAL | Status: DC
  Administered 2014-11-09: 0.3 mg via TRANSDERMAL
  Filled 2014-11-08: qty 1

## 2014-11-08 MED ORDER — ASPIRIN 81 MG PO CHEW
324.0000 mg | CHEWABLE_TABLET | Freq: Once | ORAL | Status: AC
  Administered 2014-11-08: 324 mg via ORAL
  Filled 2014-11-08: qty 4

## 2014-11-08 NOTE — ED Notes (Signed)
Pt c/o chest tightness with pain in her throat and neck.  Onset this am.  Also c/o shortness of breath.

## 2014-11-08 NOTE — H&P (Addendum)
Hospitalist Admission History and Physical  Patient name: [name] Medical record number: [id number] Date of birth: 11-05-1935 Age: 79 y.o. Gender: female  Primary Care Provider: Alva GarnetSHELTON,KIMBERLY R., MD  Chief Complaint: chest pain   History of Present Illness:This is a [age] y.o. year old female with significant past medical history of CAD s/p MI and stenting, HTN, diastolic CHF, GERD, dysrhythmia  presenting with chest pain. Pt reports central chest pain w/ radiation to L jaw over last 1-2 days. States that sxs [facility]. Denies hemiparesis, confusion. Mild SOB. No LE swelling. Chest pain constant.  Presents to ER afebrile, hemodynamically stable. CBC and BMET WNL. Trop neg x1. CXR with no acute abnormalities.  EKG sinus tachycardia. Mild to moderate improvement in pain w/ NTG per pt. EDP discussed case w/ on call Cards Mayford Knife(Turner). Follow up if trop turn positive per EDP report.   Assessment and Plan: [name] is a 79 y.o. year old female presenting with chest pain  Active Problems:   Chest pain   1- Chest Pain  -typical sxs in pt w/ baseline CAD s/p stents -trop neg x1 -EKG w/ no significant ST and T wave changes  -full dose ASA -cont plavix  -nitropaste  -cycle CEs -formal cards consult (Turner aware of case)  2- HTN -BP stable  -cont home regimen   3- Diastolic CHF -grade 1 diastolic dysfunction w/ EF 65-70%  on 2D ECHO 05/2014 -euvolemic  -follow    3-Dysrhythmia  -? Hx/o afib in the past -EKG w/ sinus tach -tele bed  -follow   FEN/GI: heart healthy diet  Prophylaxis: sub q heparin  Disposition: pending further evaluation  Code Status:Full Code    Patient Active Problem List   Diagnosis Date Noted  . Chest pain 11/08/2014  . Dyspnea 05/05/2014  . Cough 05/05/2014  . Pulmonary infiltrates 05/04/2014  . CAP (community acquired pneumonia) 05/02/2014  . Atypical face pain 03/24/2014  . Facial numbness  03/24/2014  . Dysuria 01/26/2014  . Atrophic vaginitis 01/26/2014  . Other and unspecified hyperlipidemia 07/23/2013  . Old MI (myocardial infarction)     Class: Chronic  . Abdominal pain 11/13/2011  . Nausea and vomiting 11/13/2011  . Vertigo, benign positional 07/07/2011  . HTN (hypertension), malignant 07/07/2011  . Dizziness 07/07/2011  . CAD (coronary artery disease) 07/07/2011  . A-fib 07/07/2011  . Abdominal pain, lower 07/07/2011   Past Medical History: Past Medical History  Diagnosis Date  . Coronary artery disease   . Hypertension   . Anxiety   . Fibromyalgia   . Dysrhythmia     history of "irregular heart rate"'; pt. thinks it was A-fib  . Vertigo 08/27/2006    [name]/notes 08/27/2006 (01/27/2013)  . Ventricular hypertrophy     [name]/notes 08/27/2006 (01/27/2013)  . High cholesterol   . Anginal pain   . Exertional shortness of breath     "sometimes" (01/27/2013)  . History of stomach ulcers     "years ago" (528/2014)  . GERD (gastroesophageal reflux disease)   . Arthritis     "q where" (01/27/2013)  . Osteoporosis   . Acute myocardial infarction, unspecified site, initial episode of care   . Acute myocardial infarction of other lateral wall, initial episode of care     Past Surgical History: Past Surgical History  Procedure Laterality Date  . Hernia repair    . Laparotomy      "day after oophorectomy; had bowel obstruction" (01/27/2013)  . Coronary  angioplasty with stent placement  06/2005; 01/27/2013    stent to LAD/notes 08/27/2006; "+ 1 stent today" (01/27/2013)  . Cardiac catheterization  10/2006    [name] 08/03/2007 (01/27/2013)  . Tonsillectomy and adenoidectomy  ~ 1952  . Abdominal hysterectomy  1970's  . Laparoscopic salpingo oopherectomy  2000  . Diagnostic laparoscopy  2000    lap abdominal lysis of adhesions  . Breast lumpectomy Right 1970's    benign   . Breast biopsy Right 1970's  . Left heart catheterization with coronary angiogram N/A 01/27/2013     Procedure: LEFT HEART CATHETERIZATION WITH CORONARY ANGIOGRAM;  Surgeon: Corky Crafts, MD;  Location: [facility] CATH LAB;  Service: Cardiovascular;  Laterality: N/A;    Social History: History   Social History  . Marital Status: Married    Spouse Name: N/A  . Number of Children: 3  . Years of Education: masters   Occupational History  . retired    Social History Main Topics  . Smoking status: Former Smoker -- 0.12 packs/day for 36 years    Types: Cigarettes    Quit date: 12/02/1991  . Smokeless tobacco: Never Used  . Alcohol Use: No  . Drug Use: No  . Sexual Activity: Not Currently    Birth Control/ Protection: Surgical   Other Topics Concern  . None   Social History Narrative    Family History: Family History  Problem Relation Age of Onset  . Other Mother   . Other Father     Allergies: Allergies  Allergen Reactions  . Codeine Itching    Tolerates low dose of norco    Current Facility-Administered Medications  Medication Dose Route Frequency Provider Last Rate Last Dose  . 0.9 %  sodium chloride infusion   Intravenous Continuous Floydene Flock, MD      . acetaminophen (TYLENOL) tablet 500 mg  500 mg Oral Daily PRN Floydene Flock, MD      . albuterol (PROVENTIL HFA;VENTOLIN HFA) 108 (90 BASE) MCG/ACT inhaler 2 puff  2 puff Inhalation Q6H PRN Floydene Flock, MD      . ALPRAZolam Prudy Feeler) tablet 0.25 mg  0.25 mg Oral BID Floydene Flock, MD      . Melene Muller ON 11/09/2014] aspirin tablet 325 mg  325 mg Oral Daily Floydene Flock, MD      . Melene Muller ON 11/09/2014] clopidogrel (PLAVIX) tablet 75 mg  75 mg Oral Daily Floydene Flock, MD      . Melene Muller ON 11/09/2014] furosemide (LASIX) tablet 10 mg  10 mg Oral Daily Floydene Flock, MD      . gabapentin (NEURONTIN) capsule 300 mg  300 mg Oral TID Floydene Flock, MD      . heparin injection 5,000 Units  5,000 Units Subcutaneous 3 times per day Floydene Flock, MD      . HYDROcodone-acetaminophen (NORCO/VICODIN) 5-325 MG per  tablet 0.5-1 tablet  0.5-1 tablet Oral Daily PRN Floydene Flock, MD      . morphine 2 MG/ML injection 1-2 mg  1-2 mg Intravenous Q3H PRN Floydene Flock, MD      . Melene Muller ON 11/09/2014] nitroGLYCERIN (NITRODUR - Dosed in mg/24 hr) patch 0.3 mg  0.3 mg Transdermal Daily Floydene Flock, MD      . nitroGLYCERIN (NITROSTAT) SL tablet 0.4 mg  0.4 mg Sublingual Q5 min PRN Jerelyn Scott, MD   0.4 mg at 11/08/14 2109  . nitroGLYCERIN (NITROSTAT) SL tablet 0.4 mg  0.4  mg Sublingual Q5 min PRN Floydene Flock, MD      . Melene Muller ON 11/09/2014] pantoprazole (PROTONIX) EC tablet 40 mg  40 mg Oral Q1200 Floydene Flock, MD      . Melene Muller ON 11/09/2014] potassium chloride SA (K-DUR,KLOR-CON) CR tablet 20 mEq  20 mEq Oral Daily Floydene Flock, MD      . sodium chloride 0.9 % injection 3 mL  3 mL Intravenous Q12H Floydene Flock, MD      . Melene Muller ON 11/09/2014] valsartan-hydrochlorothiazide (DIOVAN-HCT) 160-12.5 MG per tablet 1 tablet  1 tablet Oral Daily Floydene Flock, MD      . zolpidem [facility]) tablet 5 mg  5 mg Oral Daily PRN Floydene Flock, MD       Current Outpatient Prescriptions  Medication Sig Dispense Refill  . acetaminophen (TYLENOL) 500 MG tablet Take 500 mg by mouth daily as needed (pain).    [name] albuterol (PROVENTIL HFA;VENTOLIN HFA) 108 (90 BASE) MCG/ACT inhaler Inhale 2 puffs into the lungs every 6 (six) hours as needed for wheezing. 1 Inhaler 3  . ALPRAZolam (XANAX) 0.25 MG tablet Take 0.25 mg by mouth 2 (two) times daily. anxiety    . aspirin EC 81 MG tablet Take 81 mg by mouth daily.      [name] azithromycin (ZITHROMAX Z-PAK) 250 MG tablet 2 po day one, then 1 daily x 4 days 5 tablet 0  . clopidogrel (PLAVIX) 75 MG tablet Take 75 mg by mouth daily.    [name] conjugated estrogens (PREMARIN) vaginal cream As needed.    . fluticasone (FLONASE) 50 MCG/ACT nasal spray Place 2 sprays into both nostrils daily. 1 g 2  . furosemide (LASIX) 20 MG tablet Take 10 mg by mouth daily.     [name] gabapentin (NEURONTIN) 300 MG  capsule Take 1 capsule (300 mg total) by mouth 3 (three) times daily. 90 capsule 3  . HYDROcodone-acetaminophen (NORCO/VICODIN) 5-325 MG per tablet Take 0.5-1 tablets by mouth daily as needed for moderate pain. 30 tablet 0  . loratadine (CLARITIN) 10 MG tablet Take 10 mg by mouth daily.     . Nepafenac (ILEVRO) 0.3 % SUSP Apply 1 drop to eye See admin instructions. Instill 1 drop in left eye daily for 3 more days (until 05/05/14), instill 1 drop in right eye daily for 10 days starting 05/01/14    . nitroGLYCERIN (NITROSTAT) 0.4 MG SL tablet Place 1 tablet (0.4 mg total) under the tongue every 5 (five) minutes as needed for chest pain (CP or SOB). 25 tablet 12  . pantoprazole (PROTONIX) 40 MG tablet Take 1 tablet (40 mg total) by mouth daily at 12 noon. 21 tablet 0  . permethrin (ACTICIN) 5 % cream Apply 1 application topically once. Apply to face and then rest 8-14 hours before washing off 60 g 0  . potassium chloride SA (K-DUR,KLOR-CON) 20 MEQ tablet Take 20 mEq by mouth daily.    . valsartan-hydrochlorothiazide (DIOVAN-HCT) 160-12.5 MG per tablet Take 1 tablet by mouth daily.     [name] zolpidem (AMBIEN) 10 MG tablet Take 5 mg by mouth daily as needed for sleep.     . [DISCONTINUED] ezetimibe (ZETIA) 10 MG tablet Take 10 mg by mouth daily.      . [DISCONTINUED] metoprolol (TOPROL-XL) 50 MG 24 hr tablet Take 50 mg by mouth daily.      . [DISCONTINUED] potassium chloride (KLOR-CON) 20 MEQ packet Take 20 mEq by mouth daily.  Review Of Systems: 12 point ROS negative except as noted above in HPI.  Physical Exam: Filed Vitals:   11/08/14 2230  BP: 128/66  Pulse: 93  Resp: 16    General: alert and cooperative HEENT: PERRLA and extra ocular movement intact Heart: S1, S2 normal, no murmur, rub or gallop, regular rate and rhythm Lungs: clear to auscultation, no wheezes or rales and unlabored breathing Abdomen: abdomen is soft without significant tenderness, masses, organomegaly or  guarding Extremities: extremities normal, atraumatic, no cyanosis or edema Skin:no rashes, no ecchymoses Neurology: normal without focal findings  Labs and Imaging: Lab Results  Component Value Date/Time   NA 141 11/08/2014 07:58 PM   K 3.6 11/08/2014 07:58 PM   CL 99 11/08/2014 07:58 PM   CO2 30 09/25/2014 01:50 PM   BUN 14 11/08/2014 07:58 PM   CREATININE 0.90 11/08/2014 07:58 PM   CREATININE 0.95 12/02/2013 09:55 PM   GLUCOSE 153* 11/08/2014 07:58 PM   Lab Results  Component Value Date   WBC 6.7 11/08/2014   HGB 13.6 11/08/2014   HCT 40.0 11/08/2014   MCV 81.8 11/08/2014   PLT 282 11/08/2014    Dg Chest 2 View  11/08/2014   CLINICAL DATA:  Chest pain. Shortness of breath. Symptoms for 1 day. Initial encounter.  EXAM: CHEST  2 VIEW  COMPARISON:  09/25/2014.  FINDINGS: Low volume frontal view. Aortic arch atherosclerosis. Cardiopericardial silhouette within normal limits. Monitoring leads project over the chest. No focal consolidation. No pleural effusion. Retrocardiac and LEFT basilar density a is little changed compared to prior, probably representing scarring. Short segment coronary artery stent incidentally noted.  IMPRESSION: Low volume chest.  Basilar atelectasis and/ or scarring.   Electronically Signed   By: Andreas Newport M.D.   On: 11/08/2014 20:34           Doree Albee MD  Pager: [phone]

## 2014-11-08 NOTE — ED Provider Notes (Signed)
CSN: [id number]     Arrival date & time 11/08/14  1929 History   First MD Initiated Contact with Patient 11/08/14 2003     Chief Complaint  Patient presents with  . Chest Pain     (Consider location/radiation/quality/duration/timing/severity/associated sxs/prior Treatment) HPI  Pt presenting with c/o chest pain.  She states pain is a tightness in her mid chest that radiates into neck and bilateral jaws.  Symptoms began last night and have been constant through the day today.  No associated diaphoresis or nausea.  Some mild shortness of breath associated.  Pt had recent nerve conduction velocity testing? Several days ago and states after that test she has been having shooting pains in bilateral arms and legs- chest pain is distinct from this pain.  She has hx of CAD and has 2 stents- she states the symptoms feel similar to when she had first stent placed several years ago.  Has not had any treatment prior to arrival.  There are no other associated systemic symptoms, there are no other alleviating or modifying factors.   Past Medical History  Diagnosis Date  . Coronary artery disease   . Hypertension   . Anxiety   . Fibromyalgia   . Dysrhythmia     history of "irregular heart rate"'; pt. thinks it was A-fib  . Vertigo 08/27/2006    [name]/notes 08/27/2006 (01/27/2013)  . Ventricular hypertrophy     [name]/notes 08/27/2006 (01/27/2013)  . High cholesterol   . Anginal pain   . Exertional shortness of breath     "sometimes" (01/27/2013)  . History of stomach ulcers     "years ago" (528/2014)  . GERD (gastroesophageal reflux disease)   . Arthritis     "q where" (01/27/2013)  . Osteoporosis   . Acute myocardial infarction, unspecified site, initial episode of care   . Acute myocardial infarction of other lateral wall, initial episode of care    Past Surgical History  Procedure Laterality Date  . Hernia repair    . Laparotomy      "day after oophorectomy; had bowel obstruction" (01/27/2013)  .  Coronary angioplasty with stent placement  06/2005; 01/27/2013    stent to LAD/notes 08/27/2006; "+ 1 stent today" (01/27/2013)  . Cardiac catheterization  10/2006    [name]/notes 08/03/2007 (01/27/2013)  . Tonsillectomy and adenoidectomy  ~ 1952  . Abdominal hysterectomy  1970's  . Laparoscopic salpingo oopherectomy  2000  . Diagnostic laparoscopy  2000    lap abdominal lysis of adhesions  . Breast lumpectomy Right 1970's    benign   . Breast biopsy Right 1970's  . Left heart catheterization with coronary angiogram N/A 01/27/2013    Procedure: LEFT HEART CATHETERIZATION WITH CORONARY ANGIOGRAM;  Surgeon: Corky CraftsJayadeep S Varanasi, MD;  [facility];  Service: Cardiovascular;  Laterality: N/A;   Family History  Problem Relation Age of Onset  . Other Mother   . Other Father    History  Substance Use Topics  . Smoking status: Former Smoker -- 0.12 packs/day for 36 years    Types: Cigarettes    Quit date: 12/02/1991  . Smokeless tobacco: Never Used  . Alcohol Use: No   OB History    No data available     Review of Systems  ROS reviewed and all otherwise negative except for mentioned in HPI   Allergies  Codeine  Home Medications   Prior to Admission medications   Medication Sig Start Date End Date Taking? Authorizing Provider  acetaminophen (  TYLENOL) 500 MG tablet Take 500 mg by mouth daily as needed (pain).    Historical Provider, MD  albuterol (PROVENTIL HFA;VENTOLIN HFA) 108 (90 BASE) MCG/ACT inhaler Inhale 2 puffs into the lungs every 6 (six) hours as needed for wheezing. 05/23/14   Kalman Shan, MD  ALPRAZolam Prudy Feeler) 0.25 MG tablet Take 0.25 mg by mouth 2 (two) times daily. anxiety    Historical Provider, MD  aspirin EC 81 MG tablet Take 81 mg by mouth daily.      Historical Provider, MD  azithromycin (ZITHROMAX Z-PAK) 250 MG tablet 2 po day one, then 1 daily x 4 days 09/25/14   Pricilla Loveless, MD  clopidogrel (PLAVIX) 75 MG tablet Take 75 mg by mouth daily.    Historical  Provider, MD  conjugated estrogens (PREMARIN) vaginal cream As needed. 02/07/14   Historical Provider, MD  fluticasone (FLONASE) 50 MCG/ACT nasal spray Place 2 sprays into both nostrils daily. 05/06/14   Esperanza Sheets, MD  furosemide (LASIX) 20 MG tablet Take 10 mg by mouth daily.     Historical Provider, MD  gabapentin (NEURONTIN) 300 MG capsule Take 1 capsule (300 mg total) by mouth 3 (three) times daily. 06/28/14   Micki Riley, MD  HYDROcodone-acetaminophen (NORCO/VICODIN) 5-325 MG per tablet Take 0.5-1 tablets by mouth daily as needed for moderate pain. 05/06/14   Esperanza Sheets, MD  loratadine (CLARITIN) 10 MG tablet Take 10 mg by mouth daily.     Historical Provider, MD  Nepafenac (ILEVRO) 0.3 % SUSP Apply 1 drop to eye See admin instructions. Instill 1 drop in left eye daily for 3 more days (until 05/05/14), instill 1 drop in right eye daily for 10 days starting 05/01/14    Historical Provider, MD  nitroGLYCERIN (NITROSTAT) 0.4 MG SL tablet Place 1 tablet (0.4 mg total) under the tongue every 5 (five) minutes as needed for chest pain (CP or SOB). 01/28/13   Lyn Records, MD  pantoprazole (PROTONIX) 40 MG tablet Take 1 tablet (40 mg total) by mouth daily at 12 noon. 05/06/14   Esperanza Sheets, MD  permethrin (ACTICIN) 5 % cream Apply 1 application topically once. Apply to face and then rest 8-14 hours before washing off 08/24/14   Carmelina Dane, MD  potassium chloride SA (K-DUR,KLOR-CON) 20 MEQ tablet Take 20 mEq by mouth daily.    Historical Provider, MD  valsartan-hydrochlorothiazide (DIOVAN-HCT) 160-12.5 MG per tablet Take 1 tablet by mouth daily.  07/05/13   Historical Provider, MD  zolpidem (AMBIEN) 10 MG tablet Take 5 mg by mouth daily as needed for sleep.     Historical Provider, MD   BP 157/75 mmHg  Pulse 78  Temp(Src) 97.7 F (36.5 C) (Oral)  Resp 22  Ht  (1.702 m)  Wt 199 lb (90.266 kg)  BMI 31.16 kg/m2  SpO2 100%  Vitals reviewed Physical Exam  Physical Examination:  General appearance - alert, well appearing, and in no distress Mental status - alert, oriented to person, place, and time Eyes - no conjunctival injection, no scleral icterus Mouth - mucous membranes moist, pharynx normal without lesions Chest - clear to auscultation, no wheezes, rales or rhonchi, symmetric air entry, no reproducible tenderness to palpation Heart - normal rate, regular rhythm, normal S1, S2, no murmurs, rubs, clicks or gallops Abdomen - soft, nontender, nondistended, no masses or organomegaly Extremities - peripheral pulses normal, no pedal edema, no clubbing or cyanosis Skin - normal coloration and turgor, no rashes  ED  Course  Procedures (including critical care time)  9:19 PM d/w Dr. Mayford Knife, cardiology. She recommends medicine admission and to call her back if troponin turns positive.    10:35 PM d/w Dr. Alvester Morin, triad for admission.  He states he will come see the patient.  Labs Review Labs Reviewed  CBC - Abnormal; Notable for the following:    Hemoglobin 11.3 (*)    HCT 35.5 (*)    MCH 25.8 (*)    All other components within normal limits  CREATININE, SERUM - Abnormal; Notable for the following:    GFR calc non Af Amer 68 (*)    GFR calc Af Amer 79 (*)    All other components within normal limits  I-STAT CHEM 8, ED - Abnormal; Notable for the following:    Glucose, Bld 153 (*)    All other components within normal limits  CBC WITH DIFFERENTIAL/PLATELET  TROPONIN I  CBC WITH DIFFERENTIAL/PLATELET  COMPREHENSIVE METABOLIC PANEL  TROPONIN I  TROPONIN I  HEMOGLOBIN A1C  I-STAT TROPOININ, ED    Imaging Review Dg Chest 2 View  11/08/2014   CLINICAL DATA:  Chest pain. Shortness of breath. Symptoms for 1 day. Initial encounter.  EXAM: CHEST  2 VIEW  COMPARISON:  09/25/2014.  FINDINGS: Low volume frontal view. Aortic arch atherosclerosis. Cardiopericardial silhouette within normal limits. Monitoring leads project over the chest. No focal consolidation. No pleural  effusion. Retrocardiac and LEFT basilar density a is little changed compared to prior, probably representing scarring. Short segment coronary artery stent incidentally noted.  IMPRESSION: Low volume chest.  Basilar atelectasis and/ or scarring.   Electronically Signed   By: Andreas Newport M.D.   On: 11/08/2014 20:34     EKG Interpretation   Date/Time:  Tuesday November 08 2014 19:34:50 EST Ventricular Rate:  108 PR Interval:  122 QRS Duration: 82 QT Interval:  352 QTC Calculation: 471 R Axis:   40 Text Interpretation:  Sinus tachycardia Minimal voltage criteria for LVH,  may be normal variant Cannot rule out Inferior infarct , age undetermined  Abnormal ECG Since previous tracing QT interval has decreased Confirmed by  Karma Ganja  MD, MARTHA [phone]) on 11/08/2014 8:41:38 PM      MDM   Final diagnoses:  Chest pain, unspecified chest pain type    Pt presenting with chest pain, hx of cardiac stents.  Concern for ACS.  intial troponin negative.  EKG reassuring.  Pt treated with aspirin, nitroglycerin, morphine.  D/w cardiology and triad for admission.      Jerelyn Scott, MD 11/09/14 [phone]

## 2014-11-09 ENCOUNTER — Observation Stay (HOSPITAL_COMMUNITY): Payer: Medicare Other

## 2014-11-09 DIAGNOSIS — R072 Precordial pain: Secondary | ICD-10-CM

## 2014-11-09 DIAGNOSIS — R079 Chest pain, unspecified: Secondary | ICD-10-CM

## 2014-11-09 DIAGNOSIS — I10 Essential (primary) hypertension: Secondary | ICD-10-CM | POA: Diagnosis not present

## 2014-11-09 DIAGNOSIS — I2510 Atherosclerotic heart disease of native coronary artery without angina pectoris: Secondary | ICD-10-CM | POA: Diagnosis not present

## 2014-11-09 LAB — CBC WITH DIFFERENTIAL/PLATELET
Basophils Absolute: 0 10*3/uL (ref 0.0–0.1)
Basophils Relative: 1 % (ref 0–1)
EOS ABS: 0.3 10*3/uL (ref 0.0–0.7)
Eosinophils Relative: 6 % — ABNORMAL HIGH (ref 0–5)
HEMATOCRIT: 34.9 % — AB (ref 36.0–46.0)
Hemoglobin: 11.2 g/dL — ABNORMAL LOW (ref 12.0–15.0)
Lymphocytes Relative: 44 % (ref 12–46)
Lymphs Abs: 2.4 10*3/uL (ref 0.7–4.0)
MCH: 26.6 pg (ref 26.0–34.0)
MCHC: 32.1 g/dL (ref 30.0–36.0)
MCV: 82.9 fL (ref 78.0–100.0)
MONO ABS: 0.3 10*3/uL (ref 0.1–1.0)
Monocytes Relative: 5 % (ref 3–12)
NEUTROS ABS: 2.4 10*3/uL (ref 1.7–7.7)
NEUTROS PCT: 44 % (ref 43–77)
PLATELETS: 228 10*3/uL (ref 150–400)
RBC: 4.21 MIL/uL (ref 3.87–5.11)
RDW: 15.4 % (ref 11.5–15.5)
WBC: 5.4 10*3/uL (ref 4.0–10.5)

## 2014-11-09 LAB — COMPREHENSIVE METABOLIC PANEL
ALK PHOS: 76 U/L (ref 39–117)
ALT: 15 U/L (ref 0–35)
AST: 20 U/L (ref 0–37)
Albumin: 3.2 g/dL — ABNORMAL LOW (ref 3.5–5.2)
Anion gap: 3 — ABNORMAL LOW (ref 5–15)
BUN: 13 mg/dL (ref 6–23)
CO2: 30 mmol/L (ref 19–32)
Calcium: 8.7 mg/dL (ref 8.4–10.5)
Chloride: 106 mmol/L (ref 96–112)
Creatinine, Ser: 0.83 mg/dL (ref 0.50–1.10)
GFR calc non Af Amer: 67 mL/min — ABNORMAL LOW (ref >90)
GFR, EST AFRICAN AMERICAN: 77 mL/min — AB (ref >90)
Glucose, Bld: 95 mg/dL (ref 70–99)
Potassium: 3.5 mmol/L (ref 3.5–5.1)
Sodium: 139 mmol/L (ref 135–145)
Total Bilirubin: 0.5 mg/dL (ref 0.3–1.2)
Total Protein: 6.2 g/dL (ref 6.0–8.3)

## 2014-11-09 LAB — TROPONIN I: Troponin I: <0.03 ng/mL (ref <0.031)

## 2014-11-09 LAB — CREATININE, SERUM
CREATININE: 0.82 mg/dL (ref 0.50–1.10)
GFR calc Af Amer: 79 mL/min — ABNORMAL LOW (ref >90)
GFR calc non Af Amer: 68 mL/min — ABNORMAL LOW (ref >90)

## 2014-11-09 MED ORDER — GI COCKTAIL ~~LOC~~
30.0000 mL | Freq: Three times a day (TID) | ORAL | Status: DC | PRN
  Administered 2014-11-09: 30 mL via ORAL
  Filled 2014-11-09 (×2): qty 30

## 2014-11-09 MED ORDER — PREGABALIN 25 MG PO CAPS
75.0000 mg | ORAL_CAPSULE | Freq: Two times a day (BID) | ORAL | Status: DC
  Administered 2014-11-09 – 2014-11-12 (×7): 75 mg via ORAL
  Filled 2014-11-09 (×2): qty 3
  Filled 2014-11-09 (×2): qty 1
  Filled 2014-11-09: qty 3
  Filled 2014-11-09: qty 1
  Filled 2014-11-09: qty 3

## 2014-11-09 MED ORDER — PANTOPRAZOLE SODIUM 40 MG IV SOLR
40.0000 mg | Freq: Two times a day (BID) | INTRAVENOUS | Status: DC
  Administered 2014-11-09 – 2014-11-11 (×5): 40 mg via INTRAVENOUS
  Filled 2014-11-09 (×7): qty 40

## 2014-11-09 MED ORDER — HEPARIN (PORCINE) IN NACL 100-0.45 UNIT/ML-% IJ SOLN
1050.0000 [IU]/h | INTRAMUSCULAR | Status: DC
  Administered 2014-11-09: 1050 [IU]/h via INTRAVENOUS
  Filled 2014-11-09 (×2): qty 250

## 2014-11-09 MED ORDER — NITROGLYCERIN IN D5W 200-5 MCG/ML-% IV SOLN
3.0000 ug/min | INTRAVENOUS | Status: DC
  Administered 2014-11-09: 3 ug/min via INTRAVENOUS
  Filled 2014-11-09: qty 250

## 2014-11-09 MED ORDER — HEPARIN BOLUS VIA INFUSION
4000.0000 [IU] | Freq: Once | INTRAVENOUS | Status: AC
  Administered 2014-11-09: 4000 [IU] via INTRAVENOUS
  Filled 2014-11-09: qty 4000

## 2014-11-09 NOTE — Progress Notes (Signed)
ANTICOAGULATION CONSULT NOTE - Initial Consult  Pharmacy Consult for heparin Indication: chest pain/ACS  Allergies  Allergen Reactions  . Codeine Itching    Tolerates low dose of norco  . Elavil [Amitriptyline] Other (See Comments)    Dissociation  . Adhesive [Tape] Itching and Rash    Patient Measurements: Height: 5\' 7"  (170.2 cm) Weight: 199 lb (90.266 kg) IBW/kg (Calculated) : 61.6 Heparin Dosing Weight: 81kg  Vital Signs: Temp: 98.3 F (36.8 C) (03/09 1835) Temp Source: Oral (03/09 1835) BP: 140/70 mmHg (03/09 1835) Pulse Rate: 86 (03/09 1835)  Labs:  Recent Labs  11/08/14 1947 11/08/14 1958 11/08/14 2316 11/09/14 1006  HGB 12.0 13.6 11.3* 11.2*  HCT 36.9 40.0 35.5* 34.9*  PLT 282  --  248 228  CREATININE  --  0.90 0.82 0.83  TROPONINI  --   --  <0.03 <0.03    Estimated Creatinine Clearance: 66.5 mL/min (by C-G formula based on Cr of 0.83).   Medical History: Past Medical History  Diagnosis Date  . Coronary artery disease   . Hypertension   . Anxiety   . Fibromyalgia   . Dysrhythmia     history of "irregular heart rate"'; pt. thinks it was A-fib  . Vertigo 08/27/2006    [name]/notes 08/27/2006 (01/27/2013)  . Ventricular hypertrophy     [name]/notes 08/27/2006 (01/27/2013)  . High cholesterol   . Anginal pain   . Exertional shortness of breath     "sometimes" (01/27/2013)  . History of stomach ulcers     "years ago" (528/2014)  . GERD (gastroesophageal reflux disease)   . Arthritis     "q where" (01/27/2013)  . Osteoporosis   . Acute myocardial infarction, unspecified site, initial episode of care   . Acute myocardial infarction of other lateral wall, initial episode of care     Assessment: 89 YOF with history of CAD s/p MI, HTN, dCHF here with CP. Initially with unchanged EKG and unchanged enzymes and treatment for esophagitis was started. Now with continued chest and jaw pain to start heparin. Likely for diagnostic cath in the morning. Last got a dose  of subq heparin at 1300 this afternoon. Hgb 11.2, plts 228- no overt bleeding noted.  Goal of Therapy:  Heparin level 0.3-0.7 units/ml Monitor platelets by anticoagulation protocol: Yes   Plan:  -heparin bolus with 4000 units IV x1, then start infusion at 1050 units/hr -first heparin level with AM labs -daily HL and CBC -follow for cath plans and s/s bleeding  Andretta Ergle D. Treylan Mcclintock, PharmD, BCPS Clinical Pharmacist Pager: [phone] 11/09/2014 7:19 PM

## 2014-11-09 NOTE — Progress Notes (Signed)
  Echocardiogram 2D Echocardiogram has been performed.  Aris EvertsRix, Aamina Skiff A 11/09/2014, 3:33 PM

## 2014-11-09 NOTE — Progress Notes (Signed)
[facility] PROGRESS NOTE  Catherine IvanCarolyn [name] [name] ZDG:[id number] DOB: [birth year] DOA: 11/08/2014 PCP: Alva GarnetSHELTON,KIMBERLY R., MD  Assessment/Plan: 1- Chest Pain  -typical sxs in pt w/ baseline CAD s/p stents -trop neg x1 -EKG w/ no significant ST and T wave changes  -full dose ASA -cont plavix -nitropaste  -cycle CEs -Cardiology consulted.   2- HTN -BP soft in the 110. Hold HCTZ, ARB.   3- Diastolic CHF -grade 1 diastolic dysfunction w/ EF 65-70% on 2D ECHO 05/2014 -euvolemic  -NSL fluids.    3-Dysrhythmia  -? Hx/o afib in the past -EKG w/ sinus tach  4-Dysphagia;  -Will order esophagogram.  -Start IV protonix. \  Code Status: Full Code.  Family Communication:  Disposition Plan: to be determine, depending on cardiology evaluation.    Consultants:  Cardiology  Procedures:  none  Antibiotics:  none  HPI/Subjective: Still with chest pain, and jaw pain. Similar to prior heart symptoms.  She had nerve conduction study last week.  She relates difficulty with swallowing solid and liquid since yesterday.   Objective: Filed Vitals:   11/09/14 0643  BP: 117/54  Pulse: 79  Temp: 97.9 F (36.6 C)  Resp: 21    Intake/Output Summary (Last 24 hours) at 11/09/14 0938 Last data filed at 11/09/14 0730  Gross per 24 hour  Intake    240 ml  Output      0 ml  Net    240 ml   Filed Weights   11/08/14 1936  Weight: 90.266 kg (199 lb)    Exam:   General:  Alert in no distress.   Cardiovascular: S 1, S 2 RRR  Respiratory: CTA  Abdomen: BS present, soft, nt  Musculoskeletal: no edema  Data Reviewed: Basic Metabolic Panel:  Recent Labs Lab 11/08/14 1958 11/08/14 2316  NA 141  --   K 3.6  --   CL 99  --   GLUCOSE 153*  --   BUN 14  --   CREATININE 0.90 0.82   Liver Function Tests: No results for input(s): AST, ALT, ALKPHOS, BILITOT, PROT, ALBUMIN in the last 168 hours. No results for input(s): LIPASE, AMYLASE in the last 168 hours. No  results for input(s): AMMONIA in the last 168 hours. CBC:  Recent Labs Lab 11/08/14 1947 11/08/14 1958 11/08/14 2316  WBC 6.7  --  6.5  NEUTROABS 3.5  --   --   HGB 12.0 13.6 11.3*  HCT 36.9 40.0 35.5*  MCV 81.8  --  81.1  PLT 282  --  248   Cardiac Enzymes:  Recent Labs Lab 11/08/14 2316  TROPONINI <0.03   BNP (last 3 results)  Recent Labs  09/25/14 1350  BNP 46.8    ProBNP (last 3 results)  Recent Labs  05/02/14 1921  PROBNP 51.5    CBG: No results for input(s): GLUCAP in the last 168 hours.  No results found for this or any previous visit (from the past 240 hour(s)).   Studies: Dg Chest 2 View  11/08/2014   CLINICAL DATA:  Chest pain. Shortness of breath. Symptoms for 1 day. Initial encounter.  EXAM: CHEST  2 VIEW  COMPARISON:  09/25/2014.  FINDINGS: Low volume frontal view. Aortic arch atherosclerosis. Cardiopericardial silhouette within normal limits. Monitoring leads project over the chest. No focal consolidation. No pleural effusion. Retrocardiac and LEFT basilar density a is little changed compared to prior, probably representing scarring. Short segment coronary artery stent incidentally noted.  IMPRESSION: Low volume chest.  Basilar atelectasis  and/ or scarring.   Electronically Signed   By: Andreas Newport M.D.   On: 11/08/2014 20:34    Scheduled Meds: . ALPRAZolam  0.25 mg Oral BID  . aspirin  325 mg Oral Daily  . clopidogrel  75 mg Oral Daily  . furosemide  10 mg Oral Daily  . heparin  5,000 Units Subcutaneous 3 times per day  . irbesartan  150 mg Oral Daily   And  . hydrochlorothiazide  12.5 mg Oral Daily  . nitroGLYCERIN  0.3 mg Transdermal Daily  . pantoprazole  40 mg Oral Q1200  . potassium chloride SA  20 mEq Oral Daily  . pregabalin  75 mg Oral BID  . sodium chloride  3 mL Intravenous Q12H   Continuous Infusions: . sodium chloride 100 mL/hr at 11/09/14 0040    Active Problems:   Chest pain    Time spent: 35  minutes.    Hartley Barefoot A  [facility] Pager [phone]. If 7PM-7AM, please contact night-coverage at [email or web address], password [facility] 11/09/2014, 9:38 AM

## 2014-11-09 NOTE — Progress Notes (Signed)
Called to see by Dr because of continued chest and jaw pain. Not clear this is anginal but her last PCI was to her CFX. Pt thinks NTG may have helped. Her EKG and Troponin have been negative. I ordered IV NTG, Heparin, and prn pain medication. She will probably need diagnostic cath in am, will make her NPO after midnight till evaluated in am.    Corine ShelterLUKE Desani Sprung PA-C 11/09/2014 7:01 PM

## 2014-11-09 NOTE — Consult Note (Signed)
Reason for Consult: Chest pain with difficulty swallowing. Referring Physician: THP. HOPI: 79 year old black female with multiple medical problems listed below, admitted for severe chest pain radiating to her jaw and cheekbones. She claims this pain feels like the cardiac pain she has experienced in the past before she had her stent placed. She has a past medical history of CAD s/p MI and stenting of LAD remotely and then cath 12/2012 showing patent LAD stent with 95% mid left circ stenosis s/p PCI with DES, ostial 50-60% RCA stenosis and normal LVH, HTN, diastolic CHF and GERD presents with chest pain. She went to [facility] for an EMG on Friday and after that she started having diffuse body aches throughout her body. The patient is worse with swallowing. It has been constant for the last 3 days.She has a history of reflux and had a barium swallow done today after her cardiac enzymes were all negative and was found to have esophageal dysmotility but a stricture could not be ruled out. She does admit to mild SOB but no LE swelling. She denies having any melena or hematochezia.    Past Medical History  Diagnosis Date  . Coronary artery disease   . Hypertension   . Anxiety   . Fibromyalgia   . Dysrhythmia     history of "irregular heart rate"'; pt. thinks it was A-fib  . Vertigo 08/27/2006    Archie Endo 08/27/2006 (01/27/2013)  . Ventricular hypertrophy     Archie Endo 08/27/2006 (01/27/2013)  . High cholesterol   . Anginal pain   . Exertional shortness of breath     "sometimes" (01/27/2013)  . History of stomach ulcers     "years ago" (528/2014)  . GERD (gastroesophageal reflux disease)   . Arthritis     "q where" (01/27/2013)  . Osteoporosis   . Acute myocardial infarction, unspecified site, initial episode of care   . Acute myocardial infarction of other lateral wall, initial episode of care    Past Surgical History  Procedure Laterality Date  . Hernia repair    . Laparotomy      "day after  oophorectomy; had bowel obstruction" (01/27/2013)  . Coronary angioplasty with stent placement  06/2005; 01/27/2013    stent to LAD/notes 08/27/2006; "+ 1 stent today" (01/27/2013)  . Cardiac catheterization  10/2006    Archie Endo 08/03/2007 (01/27/2013)  . Tonsillectomy and adenoidectomy  ~ 1952  . Abdominal hysterectomy  1970's  . Laparoscopic salpingo oopherectomy  2000  . Diagnostic laparoscopy  2000    lap abdominal lysis of adhesions  . Breast lumpectomy Right 1970's    benign   . Breast biopsy Right 1970's  . Left heart catheterization with coronary angiogram N/A 01/27/2013    Procedure: LEFT HEART CATHETERIZATION WITH CORONARY ANGIOGRAM;  Surgeon: Jettie Booze, MD;  Location: [facility] CATH LAB;  Service: Cardiovascular;  Laterality: N/A;   Family History  Problem Relation Age of Onset  . Other Mother   . Other Father    Social History:  reports that she quit smoking about 22 years ago. Her smoking use included Cigarettes. She has a 43 pack-year smoking history. She has never used smokeless tobacco. She reports that she does not drink alcohol or use illicit drugs.  Allergies:  Allergies  Allergen Reactions  . Codeine Itching    Tolerates low dose of norco  . Elavil [Amitriptyline] Other (See Comments)    Dissociation  . Adhesive [Tape] Itching and Rash   Medications: I have reviewed the  patient's current medications.  Results for orders placed or performed during the hospital encounter of 11/08/14 (from the past 48 hour(s))  CBC with Differential     Status: None   Collection Time: 11/08/14  7:47 PM  Result Value Ref Range   WBC 6.7 4.0 - 10.5 K/uL    Comment: WHITE COUNT CONFIRMED ON SMEAR   RBC 4.51 3.87 - 5.11 MIL/uL   Hemoglobin 12.0 12.0 - 15.0 g/dL   HCT 36.9 36.0 - 46.0 %   MCV 81.8 78.0 - 100.0 fL   MCH 26.6 26.0 - 34.0 pg   MCHC 32.5 30.0 - 36.0 g/dL   RDW 15.2 11.5 - 15.5 %   Platelets 282 150 - 400 K/uL   Neutrophils Relative % 53 43 - 77 %   Lymphocytes  Relative 38 12 - 46 %   Monocytes Relative 4 3 - 12 %   Eosinophils Relative 4 0 - 5 %   Basophils Relative 1 0 - 1 %   Neutro Abs 3.5 1.7 - 7.7 K/uL   Lymphs Abs 2.5 0.7 - 4.0 K/uL   Monocytes Absolute 0.3 0.1 - 1.0 K/uL   Eosinophils Absolute 0.3 0.0 - 0.7 K/uL   Basophils Absolute 0.1 0.0 - 0.1 K/uL  I-Stat Troponin, ED (not at [facility])     Status: None   Collection Time: 11/08/14  7:57 PM  Result Value Ref Range   Troponin i, poc 0.00 0.00 - 0.08 ng/mL   Comment 3            Comment: Due to the release kinetics of cTnI, a negative result within the first hours of the onset of symptoms does not rule out myocardial infarction with certainty. If myocardial infarction is still suspected, repeat the test at appropriate intervals.   I-Stat Chem 8, ED     Status: Abnormal   Collection Time: 11/08/14  7:58 PM  Result Value Ref Range   Sodium 141 135 - 145 mmol/L   Potassium 3.6 3.5 - 5.1 mmol/L   Chloride 99 96 - 112 mmol/L   BUN 14 6 - 23 mg/dL   Creatinine, Ser 0.90 0.50 - 1.10 mg/dL   Glucose, Bld 153 (H) 70 - 99 mg/dL   Calcium, Ion 1.16 1.13 - 1.30 mmol/L   TCO2 26 0 - 100 mmol/L   Hemoglobin 13.6 12.0 - 15.0 g/dL   HCT 40.0 36.0 - 46.0 %  CBC     Status: Abnormal   Collection Time: 11/08/14 11:16 PM  Result Value Ref Range   WBC 6.5 4.0 - 10.5 K/uL   RBC 4.38 3.87 - 5.11 MIL/uL   Hemoglobin 11.3 (L) 12.0 - 15.0 g/dL    Comment: DELTA CHECK NOTED REPEATED TO VERIFY    HCT 35.5 (L) 36.0 - 46.0 %   MCV 81.1 78.0 - 100.0 fL   MCH 25.8 (L) 26.0 - 34.0 pg   MCHC 31.8 30.0 - 36.0 g/dL   RDW 15.2 11.5 - 15.5 %   Platelets 248 150 - 400 K/uL  Creatinine, serum     Status: Abnormal   Collection Time: 11/08/14 11:16 PM  Result Value Ref Range   Creatinine, Ser 0.82 0.50 - 1.10 mg/dL   GFR calc non Af Amer 68 (L) >90 mL/min   GFR calc Af Amer 79 (L) >90 mL/min    Comment: (NOTE) The eGFR has been calculated using the CKD EPI equation. This calculation has not been validated  in all clinical situations.   eGFR's persistently <90 mL/min signify possible Chronic Kidney Disease.   Troponin I     Status: None   Collection Time: 11/08/14 11:16 PM  Result Value Ref Range   Troponin I <0.03 <0.031 ng/mL    Comment:        NO INDICATION OF MYOCARDIAL INJURY.   CBC WITH DIFFERENTIAL     Status: Abnormal   Collection Time: 11/09/14 10:06 AM  Result Value Ref Range   WBC 5.4 4.0 - 10.5 K/uL   RBC 4.21 3.87 - 5.11 MIL/uL   Hemoglobin 11.2 (L) 12.0 - 15.0 g/dL   HCT 34.9 (L) 36.0 - 46.0 %   MCV 82.9 78.0 - 100.0 fL   MCH 26.6 26.0 - 34.0 pg   MCHC 32.1 30.0 - 36.0 g/dL   RDW 15.4 11.5 - 15.5 %   Platelets 228 150 - 400 K/uL   Neutrophils Relative % 44 43 - 77 %   Neutro Abs 2.4 1.7 - 7.7 K/uL   Lymphocytes Relative 44 12 - 46 %   Lymphs Abs 2.4 0.7 - 4.0 K/uL   Monocytes Relative 5 3 - 12 %   Monocytes Absolute 0.3 0.1 - 1.0 K/uL   Eosinophils Relative 6 (H) 0 - 5 %   Eosinophils Absolute 0.3 0.0 - 0.7 K/uL   Basophils Relative 1 0 - 1 %   Basophils Absolute 0.0 0.0 - 0.1 K/uL  Comprehensive metabolic panel     Status: Abnormal   Collection Time: 11/09/14 10:06 AM  Result Value Ref Range   Sodium 139 135 - 145 mmol/L   Potassium 3.5 3.5 - 5.1 mmol/L   Chloride 106 96 - 112 mmol/L   CO2 30 19 - 32 mmol/L   Glucose, Bld 95 70 - 99 mg/dL   BUN 13 6 - 23 mg/dL   Creatinine, Ser 0.83 0.50 - 1.10 mg/dL   Calcium 8.7 8.4 - 10.5 mg/dL   Total Protein 6.2 6.0 - 8.3 g/dL   Albumin 3.2 (L) 3.5 - 5.2 g/dL   AST 20 0 - 37 U/L   ALT 15 0 - 35 U/L   Alkaline Phosphatase 76 39 - 117 U/L   Total Bilirubin 0.5 0.3 - 1.2 mg/dL   GFR calc non Af Amer 67 (L) >90 mL/min   GFR calc Af Amer 77 (L) >90 mL/min    Comment: (NOTE) The eGFR has been calculated using the CKD EPI equation. This calculation has not been validated in all clinical situations. eGFR's persistently <90 mL/min signify possible Chronic Kidney Disease.    Anion gap 3 (L) 5 - 15  Troponin I      Status: None   Collection Time: 11/09/14 10:06 AM  Result Value Ref Range   Troponin I <0.03 <0.031 ng/mL    Comment:        NO INDICATION OF MYOCARDIAL INJURY.     Dg Chest 2 View  11/08/2014   CLINICAL DATA:  Chest pain. Shortness of breath. Symptoms for 1 day. Initial encounter.  EXAM: CHEST  2 VIEW  COMPARISON:  09/25/2014.  FINDINGS: Low volume frontal view. Aortic arch atherosclerosis. Cardiopericardial silhouette within normal limits. Monitoring leads project over the chest. No focal consolidation. No pleural effusion. Retrocardiac and LEFT basilar density a is little changed compared to prior, probably representing scarring. Short segment coronary artery stent incidentally noted.  IMPRESSION: Low volume chest.  Basilar atelectasis and/ or scarring.   Electronically Signed   By: Arvilla Market.D.  On: 11/08/2014 20:34   Dg Esophagus  11/09/2014   CLINICAL DATA:  Dysphagia.  Hypertension.  Coronary artery disease.  EXAM: ESOPHOGRAM/BARIUM SWALLOW  TECHNIQUE: Single contrast examination was performed using  thin barium.  FLUOROSCOPY TIME:  3 min and 1 second  COMPARISON:  Chest radiograph of 11/08/2014  FINDINGS: Single-contrast focused exam was performed in an RAO position, secondary to patient clinical status.  Evaluation of primary peristalsis demonstrates mild dysmotility with incomplete primary peristaltic wave, tertiary contractions in the lower thoracic esophagus, and mild contrast reflux into the upper thoracic esophagus. Series 1.  Full column of the esophagus demonstrates no areas of persistent narrowing or stricture superiorly. Small hiatal hernia. There is a small distal esophageal diverticulum on image 69 of series 1. Despite multiple attempts, an area of incomplete distention is identified at the gastroesophageal junction. Example image 24 of series 9.  13 mm barium tablet has delayed passage in the mid thoracic esophagus.  IMPRESSION: 1. mild esophageal dysmotility, likely  presbyesophagus. 2. Small distal esophageal diverticulum. 3. Small hiatal hernia. An area of incomplete distension at the gastroesophageal junction for which Mild peptic induced stricture cannot be excluded. This is felt less clinically significant than dysmotility. 4. Delayed passage of 13 mm barium tablet in the mid thoracic esophagus, likely secondary to dysmotility.   Electronically Signed   By: Abigail Miyamoto M.D.   On: 11/09/2014 13:57   Review of Systems  Constitutional: Positive for malaise/fatigue. Negative for fever, chills and weight loss.  HENT: Negative.   Eyes: Positive for pain.  Respiratory: Negative.   Cardiovascular: Positive for chest pain.  Genitourinary: Positive for urgency and frequency.  Skin: Negative.   Neurological: Positive for weakness.  Psychiatric/Behavioral: Negative for suicidal ideas, hallucinations and substance abuse.   Blood pressure 140/70, pulse 86, temperature 98.3 F (36.8 C), temperature source Oral, resp. rate 18, height 5' 7" (1.702 m), weight 90.266 kg (199 lb), SpO2 98 %. Physical Exam  Constitutional: She is oriented to person, place, and time. She appears well-developed and well-nourished.  HENT:  Head: Normocephalic and atraumatic.  Eyes: Conjunctivae and EOM are normal. Pupils are equal, round, and reactive to light.  Neck: Normal range of motion. Neck supple.  Cardiovascular: Normal rate and regular rhythm.   Respiratory: Effort normal and breath sounds normal.  GI: Soft. Bowel sounds are normal.  Musculoskeletal: Normal range of motion.  Neurological: She is alert and oriented to person, place, and time.  Skin: Skin is dry.  Psychiatric: She has a normal mood and affect. Her behavior is normal. Judgment and thought content normal.   Assessment/Plan: 1) Chest pain with dysphagia-abnormal barium swallow; history of GERD-will proceed with an EGD tomorrow. I will hold her Lovenox and her Plavix for tomorrow.  2) Small hiatal  hernia/GERD/Esophageal diverticulum.  3) CAD/HTN/History of MI.  Mckinzie Saksa 11/09/2014, 6:45 PM

## 2014-11-09 NOTE — Progress Notes (Signed)
11/09/2014 1830 Pt c/o recurrent c/p 10/10 jaw, cheek, neck and jaw pain. VS collected. SL NTG administered x 1. Pt. Stating relief of symptoms. Dr. Patty SermonsBrackbill paged as well as Dr. Sunnie Nielsenegalado and made aware of pt. Recurring symptoms. Cardiology to see patient. Will continue to closely monitor patient.  Katelee Schupp, Blanchard KelchStephanie Ingold

## 2014-11-09 NOTE — Progress Notes (Signed)
UR completed 

## 2014-11-09 NOTE — Progress Notes (Signed)
11/09/2014 1640 Pt. Called RN c/o jaw and cheek pain as well as chest pressure. Rating 10/10. Pt. States she has struggled with this type pain intermittently over the past few days. States PRN morphine has worked. Noted cardiac enzymes have been negative. EKG performed and unremarkable. VS collected and stable. PRN morphine given to patient. Dr. Sunnie Nielsenegalado contacted and made aware. Verbal order received for PRN GI cocktail. Orders enacted. Upon reassessment, pt. Asleep in bed, stating pain is subsiding at this time. Will continue to closely monitor patient.  Abednego Yeates, Blanchard KelchStephanie Ingold

## 2014-11-09 NOTE — Consult Note (Signed)
Admit date: 11/08/2014 Referring Physician  Dr. Carmell Austria Primary Physician  Dr. Andi Devon Primary Cardiologist  Verdis Prime, MD Reason for Consultation  Chest pain  HPI: This is a 79 y.o. year old female with past medical history of CAD s/p MI and stenting of LAD remotely and then cath 12/2012 showing patent LAD stent with 95% mid left circ stenosis s/p PCI with DES, ostial 50-60% RCA stenosis and normal LVH, HTN, diastolic CHF and GERD presents with chest pain. She went to [facility] for an EMG procedure on Friday and after that she started having diffuse body aches throughout her body that then settled in her chest and throat and into her jaw and cheekbones.  The patient is worse with movement and swallowing.  It has been constant for over 48 hours without any relief.  She does have a history of GERD.  She does admit to mild SOB but no LE swelling. In ER Trop neg x1. CXR with no acute abnormalities. EKG sinus tachycardia. She says that NTG did not improve her pain.  Cardiology is now asked to consult for further evaluation of CP.     PMH:   Past Medical History  Diagnosis Date  . Coronary artery disease   . Hypertension   . Anxiety   . Fibromyalgia   . Dysrhythmia     history of "irregular heart rate"'; pt. thinks it was A-fib  . Vertigo 08/27/2006    [name] 08/27/2006 (01/27/2013)  . Ventricular hypertrophy     [name] 08/27/2006 (01/27/2013)  . High cholesterol   . Anginal pain   . Exertional shortness of breath     "sometimes" (01/27/2013)  . History of stomach ulcers     "years ago" (528/2014)  . GERD (gastroesophageal reflux disease)   . Arthritis     "q where" (01/27/2013)  . Osteoporosis   . Acute myocardial infarction, unspecified site, initial episode of care   . Acute myocardial infarction of other lateral wall, initial episode of care      PSH:   Past Surgical History  Procedure Laterality Date  . Hernia repair    . Laparotomy      "day after oophorectomy; had bowel  obstruction" (01/27/2013)  . Coronary angioplasty with stent placement  06/2005; 01/27/2013    stent to LAD/notes 08/27/2006; "+ 1 stent today" (01/27/2013)  . Cardiac catheterization  10/2006    [name] 08/03/2007 (01/27/2013)  . Tonsillectomy and adenoidectomy  ~ 1952  . Abdominal hysterectomy  1970's  . Laparoscopic salpingo oopherectomy  2000  . Diagnostic laparoscopy  2000    lap abdominal lysis of adhesions  . Breast lumpectomy Right 1970's    benign   . Breast biopsy Right 1970's  . Left heart catheterization with coronary angiogram N/A 01/27/2013    Procedure: LEFT HEART CATHETERIZATION WITH CORONARY ANGIOGRAM;  Surgeon: Corky Crafts, MD;  Location: [facility] CATH LAB;  Service: Cardiovascular;  Laterality: N/A;    Allergies:  Codeine Prior to Admit Meds:   Prescriptions prior to admission  Medication Sig Dispense Refill Last Dose  . acetaminophen (TYLENOL) 500 MG tablet Take 500 mg by mouth daily as needed (pain).   Past Week at Unknown time  . albuterol (PROVENTIL HFA;VENTOLIN HFA) 108 (90 BASE) MCG/ACT inhaler Inhale 2 puffs into the lungs every 6 (six) hours as needed for wheezing. 1 Inhaler 3 Past Week at Unknown time  . ALPRAZolam (XANAX) 0.25 MG tablet Take 0.25 mg by mouth 2 (two) times daily.  anxiety   11/08/2014 at Unknown time  . aspirin EC 81 MG tablet Take 81 mg by mouth daily.     11/08/2014 at Unknown time  . azithromycin (ZITHROMAX Z-PAK) 250 MG tablet 2 po day one, then 1 daily x 4 days 5 tablet 0 Past Month at Unknown time  . clopidogrel (PLAVIX) 75 MG tablet Take 75 mg by mouth daily.   11/08/2014 at Unknown time  . conjugated estrogens (PREMARIN) vaginal cream As needed.   11/08/2014 at Unknown time  . fluticasone (FLONASE) 50 MCG/ACT nasal spray Place 2 sprays into both nostrils daily. 1 g 2 11/08/2014 at Unknown time  . furosemide (LASIX) 20 MG tablet Take 10 mg by mouth daily.    11/08/2014 at Unknown time  . gabapentin (NEURONTIN) 300 MG capsule Take 1 capsule (300 mg total)  by mouth 3 (three) times daily. 90 capsule 3 11/08/2014 at Unknown time  . HYDROcodone-acetaminophen (NORCO/VICODIN) 5-325 MG per tablet Take 0.5-1 tablets by mouth daily as needed for moderate pain. 30 tablet 0 Past Week at Unknown time  . loratadine (CLARITIN) 10 MG tablet Take 10 mg by mouth daily.    11/08/2014 at Unknown time  . Nepafenac (ILEVRO) 0.3 % SUSP Apply 1 drop to eye See admin instructions. Instill 1 drop in left eye daily for 3 more days (until 05/05/14), instill 1 drop in right eye daily for 10 days starting 05/01/14   11/08/2014 at Unknown time  . nitroGLYCERIN (NITROSTAT) 0.4 MG SL tablet Place 1 tablet (0.4 mg total) under the tongue every 5 (five) minutes as needed for chest pain (CP or SOB). 25 tablet 12 Past Week at Unknown time  . pantoprazole (PROTONIX) 40 MG tablet Take 1 tablet (40 mg total) by mouth daily at 12 noon. 21 tablet 0 11/08/2014 at Unknown time  . permethrin (ACTICIN) 5 % cream Apply 1 application topically once. Apply to face and then rest 8-14 hours before washing off 60 g 0 11/08/2014 at Unknown time  . potassium chloride SA (K-DUR,KLOR-CON) 20 MEQ tablet Take 20 mEq by mouth daily.   11/08/2014 at Unknown time  . valsartan-hydrochlorothiazide (DIOVAN-HCT) 160-12.5 MG per tablet Take 1 tablet by mouth daily.    11/08/2014 at Unknown time  . zolpidem (AMBIEN) 10 MG tablet Take 5 mg by mouth daily as needed for sleep.    11/08/2014 at Unknown time   Fam HX:    Family History  Problem Relation Age of Onset  . Other Mother   . Other Father    Social HX:    History   Social History  . Marital Status: Married    Spouse Name: N/A  . Number of Children: 3  . Years of Education: masters   Occupational History  . retired    Social History Main Topics  . Smoking status: Former Smoker -- 0.12 packs/day for 36 years    Types: Cigarettes    Quit date: 12/02/1991  . Smokeless tobacco: Never Used  . Alcohol Use: No  . Drug Use: No  . Sexual Activity: Not Currently     Birth Control/ Protection: Surgical   Other Topics Concern  . Not on file   Social History Narrative     ROS:  All 11 ROS were addressed and are negative except what is stated in the HPI  Physical Exam: Blood pressure 117/54, pulse 79, temperature 97.9 F (36.6 C), temperature source Oral, resp. rate 21, height  (1.702 m), weight 199 lb (90.266  kg), SpO2 96 %.    General: Well developed, well nourished, in no acute distress Head: Eyes PERRLA, No xanthomas.   Normal cephalic and atramatic  Lungs:   Clear bilaterally to auscultation and percussion. Heart:   HRRR S1 S2 Pulses are 2+ & equal.            No carotid bruit. No JVD.  No abdominal bruits. No femoral bruits.  Chest wall tender to palpation over the right and left sides of her chest Abdomen: Bowel sounds are positive, abdomen soft and non-tender without masses Extremities:   No clubbing, cyanosis or edema.  DP +1 Neuro: Alert and oriented X 3. Psych:  Good affect, responds appropriately    Labs:   Lab Results  Component Value Date   WBC 6.5 11/08/2014   HGB 11.3* 11/08/2014   HCT 35.5* 11/08/2014   MCV 81.1 11/08/2014   PLT 248 11/08/2014    Recent Labs Lab 11/08/14 1958 11/08/14 2316  NA 141  --   K 3.6  --   CL 99  --   BUN 14  --   CREATININE 0.90 0.82  GLUCOSE 153*  --    No results found for: PTT Lab Results  Component Value Date   INR 0.92 04/24/2011   INR 1.0 04/17/2009   INR 1.0 02/29/2008   Lab Results  Component Value Date   CKTOTAL 142 12/02/2013   CKMB 2.7 07/07/2011   TROPONINI <0.03 11/08/2014     Lab Results  Component Value Date   CHOL * 04/18/2009    271        ATP III CLASSIFICATION:  <200     mg/dL   Desirable  161-096200-239  mg/dL   Borderline High  >=045>=240    mg/dL   High          CHOL  40/98/119106/26/2009    160        ATP III CLASSIFICATION:  <200     mg/dL   Desirable  478-295200-239  mg/dL   Borderline High  >=621>=240    mg/dL   High   Lab Results  Component Value Date   HDL 42  04/18/2009   HDL 41 02/26/2008   Lab Results  Component Value Date   LDLCALC * 04/18/2009    206        Total Cholesterol/HDL:CHD Risk Coronary Heart Disease Risk Table                     Men   Women  1/2 Average Risk   3.4   3.3  Average Risk       5.0   4.4  2 X Average Risk   9.6   7.1  3 X Average Risk  23.4   11.0        Use the calculated Patient Ratio above and the CHD Risk Table to determine the patient's CHD Risk.        ATP III CLASSIFICATION (LDL):  <100     mg/dL   Optimal  308-657100-129  mg/dL   Near or Above                    Optimal  130-159  mg/dL   Borderline  846-962160-189  mg/dL   High  >952>190     mg/dL   Very High   LDLCALC * 02/26/2008    107        Total Cholesterol/HDL:CHD Risk Coronary Heart  Disease Risk Table                     Men   Women  1/2 Average Risk   3.4   3.3   Lab Results  Component Value Date   TRIG 113 04/18/2009   TRIG 60 02/26/2008   Lab Results  Component Value Date   CHOLHDL 6.5 04/18/2009   CHOLHDL 3.9 02/26/2008   No results found for: LDLDIRECT    Radiology:  Dg Chest 2 View  11/08/2014   CLINICAL DATA:  Chest pain. Shortness of breath. Symptoms for 1 day. Initial encounter.  EXAM: CHEST  2 VIEW  COMPARISON:  09/25/2014.  FINDINGS: Low volume frontal view. Aortic arch atherosclerosis. Cardiopericardial silhouette within normal limits. Monitoring leads project over the chest. No focal consolidation. No pleural effusion. Retrocardiac and LEFT basilar density a is little changed compared to prior, probably representing scarring. Short segment coronary artery stent incidentally noted.  IMPRESSION: Low volume chest.  Basilar atelectasis and/ or scarring.   Electronically Signed   By: Andreas Newport M.D.   On: 11/08/2014 20:34    EKG:  Sinus tachycardia with LVH and q waves in III and aVF - no significant change from prior  ASSESSMENT/PLAN 1.  Atypical chest pain that has been constant for over 48 hours.  It is reproducible with  palpitation of her right and left chest wall.  It also is exacerbated by swallowing.  She has pain in her jaw and cheekbones that is reminiscent of her prior angina but cardiac enzymes have been negative despite ongoing CP.  I suspect this is GI in etiology but also could have a musculoskeletal component.  Her EKG is nonischemic and unchanged from prior.  Recommend trying a GI cocktail to evalue for possible reflux esophagitis.  Continue PPI.  Will continue to cycle enzymes.  Check 2D echo to assess for wall motion abnormalities.  2.  ASCAD with remote MI and PCI of LAD with cath 2014 with patent LAD s/p PCI of the left circ with residual 50-60% RCA. 3.  HTN - controlled 4.  Chronic diastolic CHF - appears euvolemic    Quintella Reichert, MD  11/09/2014  10:15 AM

## 2014-11-10 ENCOUNTER — Observation Stay (HOSPITAL_COMMUNITY): Payer: Medicare Other

## 2014-11-10 ENCOUNTER — Encounter (HOSPITAL_COMMUNITY)
Admission: EM | Disposition: A | Discharge status: Home / Self Care | Payer: BC Managed Care – PPO | Source: Home / Self Care | Attending: Internal Medicine

## 2014-11-10 ENCOUNTER — Encounter (HOSPITAL_COMMUNITY): Payer: Self-pay | Admitting: *Deleted

## 2014-11-10 ENCOUNTER — Inpatient Hospital Stay (HOSPITAL_COMMUNITY): Payer: Medicare Other

## 2014-11-10 DIAGNOSIS — K449 Diaphragmatic hernia without obstruction or gangrene: Secondary | ICD-10-CM | POA: Diagnosis present

## 2014-11-10 DIAGNOSIS — I252 Old myocardial infarction: Secondary | ICD-10-CM | POA: Diagnosis not present

## 2014-11-10 DIAGNOSIS — R1310 Dysphagia, unspecified: Secondary | ICD-10-CM | POA: Diagnosis not present

## 2014-11-10 DIAGNOSIS — I2510 Atherosclerotic heart disease of native coronary artery without angina pectoris: Principal | ICD-10-CM

## 2014-11-10 DIAGNOSIS — Z87891 Personal history of nicotine dependence: Secondary | ICD-10-CM | POA: Diagnosis not present

## 2014-11-10 DIAGNOSIS — I209 Angina pectoris, unspecified: Secondary | ICD-10-CM | POA: Diagnosis not present

## 2014-11-10 DIAGNOSIS — I10 Essential (primary) hypertension: Secondary | ICD-10-CM | POA: Diagnosis not present

## 2014-11-10 DIAGNOSIS — I25119 Atherosclerotic heart disease of native coronary artery with unspecified angina pectoris: Secondary | ICD-10-CM | POA: Diagnosis not present

## 2014-11-10 DIAGNOSIS — K219 Gastro-esophageal reflux disease without esophagitis: Secondary | ICD-10-CM | POA: Diagnosis present

## 2014-11-10 DIAGNOSIS — K224 Dyskinesia of esophagus: Secondary | ICD-10-CM | POA: Diagnosis present

## 2014-11-10 DIAGNOSIS — T82858A Stenosis of vascular prosthetic devices, implants and grafts, initial encounter: Secondary | ICD-10-CM | POA: Diagnosis present

## 2014-11-10 DIAGNOSIS — Z7902 Long term (current) use of antithrombotics/antiplatelets: Secondary | ICD-10-CM | POA: Diagnosis not present

## 2014-11-10 DIAGNOSIS — M25511 Pain in right shoulder: Secondary | ICD-10-CM | POA: Diagnosis not present

## 2014-11-10 DIAGNOSIS — M797 Fibromyalgia: Secondary | ICD-10-CM | POA: Diagnosis present

## 2014-11-10 DIAGNOSIS — Y831 Surgical operation with implant of artificial internal device as the cause of abnormal reaction of the patient, or of later complication, without mention of misadventure at the time of the procedure: Secondary | ICD-10-CM | POA: Diagnosis present

## 2014-11-10 DIAGNOSIS — F419 Anxiety disorder, unspecified: Secondary | ICD-10-CM | POA: Diagnosis present

## 2014-11-10 DIAGNOSIS — E785 Hyperlipidemia, unspecified: Secondary | ICD-10-CM

## 2014-11-10 DIAGNOSIS — Z7982 Long term (current) use of aspirin: Secondary | ICD-10-CM | POA: Diagnosis not present

## 2014-11-10 DIAGNOSIS — K5900 Constipation, unspecified: Secondary | ICD-10-CM | POA: Diagnosis not present

## 2014-11-10 DIAGNOSIS — E780 Pure hypercholesterolemia: Secondary | ICD-10-CM | POA: Diagnosis present

## 2014-11-10 DIAGNOSIS — Z885 Allergy status to narcotic agent status: Secondary | ICD-10-CM | POA: Diagnosis not present

## 2014-11-10 DIAGNOSIS — E669 Obesity, unspecified: Secondary | ICD-10-CM | POA: Diagnosis present

## 2014-11-10 DIAGNOSIS — Z6834 Body mass index (BMI) 34.0-34.9, adult: Secondary | ICD-10-CM | POA: Diagnosis not present

## 2014-11-10 DIAGNOSIS — M810 Age-related osteoporosis without current pathological fracture: Secondary | ICD-10-CM | POA: Diagnosis present

## 2014-11-10 DIAGNOSIS — I471 Supraventricular tachycardia: Secondary | ICD-10-CM | POA: Diagnosis not present

## 2014-11-10 DIAGNOSIS — Z91048 Other nonmedicinal substance allergy status: Secondary | ICD-10-CM | POA: Diagnosis not present

## 2014-11-10 DIAGNOSIS — R079 Chest pain, unspecified: Secondary | ICD-10-CM | POA: Diagnosis present

## 2014-11-10 DIAGNOSIS — I5032 Chronic diastolic (congestive) heart failure: Secondary | ICD-10-CM | POA: Diagnosis present

## 2014-11-10 HISTORY — PX: ESOPHAGOGASTRODUODENOSCOPY: SHX5428

## 2014-11-10 HISTORY — PX: LEFT HEART CATHETERIZATION WITH CORONARY ANGIOGRAM: SHX5451

## 2014-11-10 LAB — CBC
HCT: 34.7 % — ABNORMAL LOW (ref 36.0–46.0)
Hemoglobin: 11.1 g/dL — ABNORMAL LOW (ref 12.0–15.0)
MCH: 26.2 pg (ref 26.0–34.0)
MCHC: 32 g/dL (ref 30.0–36.0)
MCV: 81.8 fL (ref 78.0–100.0)
PLATELETS: 226 10*3/uL (ref 150–400)
RBC: 4.24 MIL/uL (ref 3.87–5.11)
RDW: 15.4 % (ref 11.5–15.5)
WBC: 5.8 10*3/uL (ref 4.0–10.5)

## 2014-11-10 LAB — COMPREHENSIVE METABOLIC PANEL
ALK PHOS: 77 U/L (ref 39–117)
ALT: 14 U/L (ref 0–35)
ANION GAP: 6 (ref 5–15)
AST: 17 U/L (ref 0–37)
Albumin: 3.1 g/dL — ABNORMAL LOW (ref 3.5–5.2)
BUN: 9 mg/dL (ref 6–23)
CALCIUM: 8.9 mg/dL (ref 8.4–10.5)
CO2: 28 mmol/L (ref 19–32)
CREATININE: 0.8 mg/dL (ref 0.50–1.10)
Chloride: 105 mmol/L (ref 96–112)
GFR, EST AFRICAN AMERICAN: 81 mL/min — AB (ref >90)
GFR, EST NON AFRICAN AMERICAN: 70 mL/min — AB (ref >90)
Glucose, Bld: 105 mg/dL — ABNORMAL HIGH (ref 70–99)
Potassium: 4 mmol/L (ref 3.5–5.1)
Sodium: 139 mmol/L (ref 135–145)
Total Bilirubin: 0.3 mg/dL (ref 0.3–1.2)
Total Protein: 6.3 g/dL (ref 6.0–8.3)

## 2014-11-10 LAB — CBC WITH DIFFERENTIAL/PLATELET
Basophils Absolute: 0 10*3/uL (ref 0.0–0.1)
Basophils Relative: 0 % (ref 0–1)
Eosinophils Absolute: 0.3 10*3/uL (ref 0.0–0.7)
Eosinophils Relative: 5 % (ref 0–5)
HEMATOCRIT: 34.8 % — AB (ref 36.0–46.0)
HEMOGLOBIN: 11 g/dL — AB (ref 12.0–15.0)
LYMPHS PCT: 48 % — AB (ref 12–46)
Lymphs Abs: 2.7 10*3/uL (ref 0.7–4.0)
MCH: 25.7 pg — ABNORMAL LOW (ref 26.0–34.0)
MCHC: 31.6 g/dL (ref 30.0–36.0)
MCV: 81.3 fL (ref 78.0–100.0)
Monocytes Absolute: 0.3 10*3/uL (ref 0.1–1.0)
Monocytes Relative: 6 % (ref 3–12)
Neutro Abs: 2.3 10*3/uL (ref 1.7–7.7)
Neutrophils Relative %: 41 % — ABNORMAL LOW (ref 43–77)
Platelets: 236 10*3/uL (ref 150–400)
RBC: 4.28 MIL/uL (ref 3.87–5.11)
RDW: 15.5 % (ref 11.5–15.5)
WBC: 5.5 10*3/uL (ref 4.0–10.5)

## 2014-11-10 LAB — CREATININE, SERUM
Creatinine, Ser: 0.86 mg/dL (ref 0.50–1.10)
GFR calc Af Amer: 74 mL/min — ABNORMAL LOW (ref >90)
GFR calc non Af Amer: 64 mL/min — ABNORMAL LOW (ref >90)

## 2014-11-10 LAB — HEPARIN LEVEL (UNFRACTIONATED): Heparin Unfractionated: 0.34 IU/mL (ref 0.30–0.70)

## 2014-11-10 LAB — TROPONIN I
Troponin I: <0.03 ng/mL (ref <0.031)
Troponin I: <0.03 ng/mL (ref <0.031)

## 2014-11-10 LAB — POCT ACTIVATED CLOTTING TIME
ACTIVATED CLOTTING TIME: 300 s
Activated Clotting Time: 921 seconds

## 2014-11-10 LAB — HEMOGLOBIN A1C
Hgb A1c MFr Bld: 6.4 % — ABNORMAL HIGH (ref 4.8–5.6)
MEAN PLASMA GLUCOSE: 137 mg/dL

## 2014-11-10 LAB — PROTIME-INR
INR: 1.1 (ref 0.00–1.49)
PROTHROMBIN TIME: 14.3 s (ref 11.6–15.2)

## 2014-11-10 SURGERY — EGD (ESOPHAGOGASTRODUODENOSCOPY)
Anesthesia: Moderate Sedation

## 2014-11-10 SURGERY — LEFT HEART CATHETERIZATION WITH CORONARY ANGIOGRAM
Anesthesia: LOCAL

## 2014-11-10 MED ORDER — GUAIFENESIN-DM 100-10 MG/5ML PO SYRP
5.0000 mL | ORAL_SOLUTION | ORAL | Status: DC | PRN
  Administered 2014-11-10: 5 mL via ORAL
  Filled 2014-11-10: qty 5

## 2014-11-10 MED ORDER — NITROGLYCERIN IN D5W 200-5 MCG/ML-% IV SOLN: 0.0000 ug/min | INTRAVENOUS | Status: DC

## 2014-11-10 MED ORDER — FENTANYL CITRATE 0.05 MG/ML IJ SOLN
INTRAMUSCULAR | Status: AC
  Filled 2014-11-10: qty 2

## 2014-11-10 MED ORDER — NITROGLYCERIN 1 MG/10 ML FOR IR/CATH LAB
INTRA_ARTERIAL | Status: AC
  Filled 2014-11-10: qty 10

## 2014-11-10 MED ORDER — HEPARIN SODIUM (PORCINE) 5000 UNIT/ML IJ SOLN
5000.0000 [IU] | Freq: Three times a day (TID) | INTRAMUSCULAR | Status: DC
  Administered 2014-11-11 (×2): 5000 [IU] via SUBCUTANEOUS
  Filled 2014-11-10 (×3): qty 1

## 2014-11-10 MED ORDER — DIPHENHYDRAMINE HCL 50 MG/ML IJ SOLN
INTRAMUSCULAR | Status: AC
  Filled 2014-11-10: qty 1

## 2014-11-10 MED ORDER — VERAPAMIL HCL 2.5 MG/ML IV SOLN
INTRAVENOUS | Status: AC
  Filled 2014-11-10: qty 2

## 2014-11-10 MED ORDER — FENTANYL CITRATE 0.05 MG/ML IJ SOLN
INTRAMUSCULAR | Status: DC | PRN
  Administered 2014-11-10 (×2): 25 ug via INTRAVENOUS

## 2014-11-10 MED ORDER — ASPIRIN 81 MG PO CHEW: 81.0000 mg | CHEWABLE_TABLET | ORAL | Status: DC

## 2014-11-10 MED ORDER — HEPARIN SODIUM (PORCINE) 1000 UNIT/ML IJ SOLN
INTRAMUSCULAR | Status: AC
  Filled 2014-11-10: qty 1

## 2014-11-10 MED ORDER — ATORVASTATIN CALCIUM 40 MG PO TABS
40.0000 mg | ORAL_TABLET | Freq: Every day | ORAL | Status: DC
  Administered 2014-11-10: 40 mg via ORAL
  Filled 2014-11-10 (×3): qty 1

## 2014-11-10 MED ORDER — LIDOCAINE HCL (PF) 1 % IJ SOLN
INTRAMUSCULAR | Status: AC
  Filled 2014-11-10: qty 30

## 2014-11-10 MED ORDER — CLOPIDOGREL BISULFATE 75 MG PO TABS
75.0000 mg | ORAL_TABLET | Freq: Every day | ORAL | Status: DC
  Administered 2014-11-11 – 2014-11-12 (×2): 75 mg via ORAL
  Filled 2014-11-10 (×2): qty 1

## 2014-11-10 MED ORDER — SODIUM CHLORIDE 0.9 % IV SOLN: 1.0000 mL/kg/h | INTRAVENOUS | Status: DC

## 2014-11-10 MED ORDER — MIDAZOLAM HCL 2 MG/2ML IJ SOLN
INTRAMUSCULAR | Status: AC
  Filled 2014-11-10: qty 2

## 2014-11-10 MED ORDER — ACETAMINOPHEN 325 MG PO TABS: 650.0000 mg | ORAL_TABLET | ORAL | Status: DC | PRN

## 2014-11-10 MED ORDER — ADENOSINE 12 MG/4ML IV SOLN
16.0000 mL | INTRAVENOUS | Status: AC
  Filled 2014-11-10: qty 16

## 2014-11-10 MED ORDER — HEPARIN (PORCINE) IN NACL 2-0.9 UNIT/ML-% IJ SOLN
INTRAMUSCULAR | Status: AC
  Filled 2014-11-10: qty 500

## 2014-11-10 MED ORDER — SODIUM CHLORIDE 0.9 % IJ SOLN: 3.0000 mL | INTRAMUSCULAR | Status: DC | PRN

## 2014-11-10 MED ORDER — METOPROLOL TARTRATE 25 MG PO TABS: 25.0000 mg | ORAL_TABLET | Freq: Two times a day (BID) | ORAL | Status: DC

## 2014-11-10 MED ORDER — SODIUM CHLORIDE 0.9 % IV SOLN: 250.0000 mL | INTRAVENOUS | Status: DC | PRN

## 2014-11-10 MED ORDER — MORPHINE SULFATE 2 MG/ML IJ SOLN
2.0000 mg | Freq: Once | INTRAMUSCULAR | Status: AC
  Administered 2014-11-10: 2 mg via INTRAVENOUS

## 2014-11-10 MED ORDER — MIDAZOLAM HCL 10 MG/2ML IJ SOLN
INTRAMUSCULAR | Status: DC | PRN
  Administered 2014-11-10 (×2): 2 mg via INTRAVENOUS
  Administered 2014-11-10: 1 mg via INTRAVENOUS

## 2014-11-10 MED ORDER — MORPHINE SULFATE 2 MG/ML IJ SOLN
2.0000 mg | INTRAMUSCULAR | Status: DC | PRN
  Administered 2014-11-10 – 2014-11-12 (×6): 2 mg via INTRAVENOUS
  Filled 2014-11-10 (×8): qty 1

## 2014-11-10 MED ORDER — ASPIRIN EC 81 MG PO TBEC
81.0000 mg | DELAYED_RELEASE_TABLET | Freq: Every day | ORAL | Status: DC
  Administered 2014-11-11 – 2014-11-12 (×2): 81 mg via ORAL
  Filled 2014-11-10 (×2): qty 1

## 2014-11-10 MED ORDER — BIVALIRUDIN 250 MG IV SOLR
INTRAVENOUS | Status: AC
  Filled 2014-11-10: qty 250

## 2014-11-10 MED ORDER — MIDAZOLAM HCL 5 MG/ML IJ SOLN
INTRAMUSCULAR | Status: AC
  Filled 2014-11-10: qty 2

## 2014-11-10 MED ORDER — HEPARIN (PORCINE) IN NACL 2-0.9 UNIT/ML-% IJ SOLN
INTRAMUSCULAR | Status: AC
  Filled 2014-11-10: qty 1000

## 2014-11-10 MED ORDER — SODIUM CHLORIDE 0.9 % IV SOLN
INTRAVENOUS | Status: DC
  Administered 2014-11-10: 13:00:00 via INTRAVENOUS

## 2014-11-10 MED ORDER — SODIUM CHLORIDE 0.9 % IJ SOLN: 3.0000 mL | Freq: Two times a day (BID) | INTRAMUSCULAR | Status: DC

## 2014-11-10 MED ORDER — SODIUM CHLORIDE 0.9 % IV SOLN
INTRAVENOUS | Status: DC
  Administered 2014-11-10: 22:00:00 via INTRAVENOUS

## 2014-11-10 MED ORDER — ONDANSETRON HCL 4 MG/2ML IJ SOLN
4.0000 mg | Freq: Four times a day (QID) | INTRAMUSCULAR | Status: DC | PRN
  Administered 2014-11-10: 4 mg via INTRAVENOUS
  Filled 2014-11-10: qty 2

## 2014-11-10 MED ORDER — SODIUM CHLORIDE 0.9 % IV SOLN
INTRAVENOUS | Status: DC
  Administered 2014-11-10: 06:00:00 via INTRAVENOUS

## 2014-11-10 MED ORDER — METOPROLOL TARTRATE 12.5 MG HALF TABLET
12.5000 mg | ORAL_TABLET | Freq: Two times a day (BID) | ORAL | Status: DC
  Administered 2014-11-10 – 2014-11-12 (×5): 12.5 mg via ORAL
  Filled 2014-11-10 (×6): qty 1

## 2014-11-10 NOTE — Progress Notes (Signed)
UR completed 

## 2014-11-10 NOTE — Progress Notes (Signed)
ANTICOAGULATION CONSULT NOTE  Pharmacy Consult for heparin Indication: chest pain/ACS  Allergies  Allergen Reactions  . Codeine Itching    Tolerates low dose of norco  . Elavil [Amitriptyline] Other (See Comments)    Dissociation  . Adhesive [Tape] Itching and Rash    Patient Measurements: Height: 5\' 7"  (170.2 cm) Weight: 208 lb 15.9 oz (94.8 kg) IBW/kg (Calculated) : 61.6 Heparin Dosing Weight: 81kg  Vital Signs: Temp: 99.2 F (37.3 C) (03/10 0503) Temp Source: Oral (03/10 0503) BP: 136/56 mmHg (03/10 0503) Pulse Rate: 79 (03/10 0503)  Labs:  Recent Labs  11/08/14 1947 11/08/14 1958  11/08/14 2316 11/09/14 1006 11/09/14 1834 11/10/14 0044 11/10/14 0601  HGB 12.0 13.6  --  11.3* 11.2*  --   --   --   HCT 36.9 40.0  --  35.5* 34.9*  --   --   --   PLT 282  --   --  248 228  --   --   --   HEPARINUNFRC  --   --   --   --   --   --   --  0.34  CREATININE  --  0.90  --  0.82 0.83  --   --   --   TROPONINI  --   --   < > <0.03 <0.03 <0.03 <0.03  --   < > = values in this interval not displayed.  Estimated Creatinine Clearance: 68.2 mL/min (by C-G formula based on Cr of 0.83).  Assessment: [age] y.o. female with chest pain for heparin   Goal of Therapy:  Heparin level 0.3-0.7 units/ml Monitor platelets by anticoagulation protocol: Yes   Plan:  Continue Heparin at current rate  Geannie RisenGreg Reinhart Saulters, PharmD, BCPS  11/10/2014 6:52 AM

## 2014-11-10 NOTE — Progress Notes (Signed)
Patient complains of chest pressure that radiates to her right arm and jaw. RN titrated IV nitro to 13 mcg/min, morphine administered, VSS. Blood pressure 130/63 HR 84. Will continue to monitor patient.  Toya SmothersLy Marcellous Snarski, RN

## 2014-11-10 NOTE — H&P (View-Only) (Signed)
Patient Name: [name] Date of Encounter: 11/10/2014     Active Problems:   HTN (hypertension), malignant   CAD (coronary artery disease)   Chest pain   Pain in the chest    SUBJECTIVE  Moaning with chest tightness. She is having some SOB as well. Would like some morhpine for pain.   CURRENT MEDS . ALPRAZolam  0.25 mg Oral BID  . aspirin  325 mg Oral Daily  . clopidogrel  75 mg Oral Daily  . furosemide  10 mg Oral Daily  . pantoprazole (PROTONIX) IV  40 mg Intravenous Q12H  . potassium chloride SA  20 mEq Oral Daily  . pregabalin  75 mg Oral BID  . sodium chloride  3 mL Intravenous Q12H    OBJECTIVE  Filed Vitals:   11/10/14 0840 11/10/14 0845 11/10/14 0856 11/10/14 0900  BP: 165/98 161/69 145/66 157/76  Pulse: 81 84  82  Temp:   98.3 F (36.8 C)   TempSrc:   Oral   Resp: Height:      Weight:      SpO2: 100% 97% 93% 98%    Intake/Output Summary (Last 24 hours) at 11/10/14 0920 Last data filed at 11/09/14 1230  Gross per 24 hour  Intake    240 ml  Output      0 ml  Net    240 ml   Filed Weights   11/08/14 1936 11/10/14 0240  Weight: 199 lb (90.266 kg) 208 lb 15.9 oz (94.8 kg)    PHYSICAL EXAM  General: Pleasant, NAD. obese Neuro: Alert and oriented X 3. Moves all extremities spontaneously. Psych: Normal affect. HEENT:  Normal  Neck: Supple without bruits or JVD. Lungs:  Resp regular and unlabored, CTA. Heart: RRR no s3, s4, or murmurs. Abdomen: Soft, non-tender, non-distended, BS + x 4.  Extremities: No clubbing, cyanosis or edema. DP/PT/Radials 2+ and equal bilaterally.  Accessory Clinical Findings  CBC  Recent Labs  11/09/14 1006 11/10/14 0601  WBC 5.4 5.5  NEUTROABS 2.4 2.3  HGB 11.2* 11.0*  HCT 34.9* 34.8*  MCV 82.9 81.3  PLT 228 236   Basic Metabolic Panel  Recent Labs  11/09/14 1006 11/10/14 0601  NA 139 139  K 3.5 4.0  CL 106 105  CO2 30 28  GLUCOSE 95 105*  BUN 13 9  CREATININE 0.83 0.80    CALCIUM 8.7 8.9   Liver Function Tests  Recent Labs  11/09/14 1006 11/10/14 0601  AST 20 17  ALT 15 14  ALKPHOS 76 77  BILITOT 0.5 0.3  PROT 6.2 6.3  ALBUMIN 3.2* 3.1*   No results for input(s): LIPASE, AMYLASE in the last 72 hours. Cardiac Enzymes  Recent Labs  11/09/14 1834 11/10/14 0044 11/10/14 0601  TROPONINI <0.03 <0.03 <0.03   BNP Invalid input(s): POCBNP D-Dimer No results for input(s): DDIMER in the last 72 hours. Hemoglobin A1C  Recent Labs  11/08/14 2316  HGBA1C 6.4*    TELE NSR with brief run of atrial tach/ afib? MD to review  Radiology/Studies  Dg Chest 2 View  11/08/2014   CLINICAL DATA:  Chest pain. Shortness of breath. Symptoms for 1 day. Initial encounter.  EXAM: CHEST  2 VIEW  COMPARISON:  09/25/2014.  FINDINGS: Low volume frontal view. Aortic arch atherosclerosis. Cardiopericardial silhouette within normal limits. Monitoring leads project over the chest. No focal consolidation. No pleural effusion. Retrocardiac and LEFT basilar density a is little changed compared to  prior, probably representing scarring. Short segment coronary artery stent incidentally noted.  IMPRESSION: Low volume chest.  Basilar atelectasis and/ or scarring.   Electronically Signed   By: Andreas NewportGeoffrey  Lamke M.D.   On: 11/08/2014 20:34   Dg Esophagus  11/09/2014   CLINICAL DATA:  Dysphagia.  Hypertension.  Coronary artery disease.  EXAM: ESOPHOGRAM/BARIUM SWALLOW  TECHNIQUE: Single contrast examination was performed using  thin barium.  FLUOROSCOPY TIME:  3 min and 1 second  COMPARISON:  Chest radiograph of 11/08/2014  FINDINGS: Single-contrast focused exam was performed in an RAO position, secondary to patient clinical status.  Evaluation of primary peristalsis demonstrates mild dysmotility with incomplete primary peristaltic wave, tertiary contractions in the lower thoracic esophagus, and mild contrast reflux into the upper thoracic esophagus. Series 1.  Full column of the esophagus  demonstrates no areas of persistent narrowing or stricture superiorly. Small hiatal hernia. There is a small distal esophageal diverticulum on image 69 of series 1. Despite multiple attempts, an area of incomplete distention is identified at the gastroesophageal junction. Example image 24 of series 9.  13 mm barium tablet has delayed passage in the mid thoracic esophagus.  IMPRESSION: 1. mild esophageal dysmotility, likely presbyesophagus. 2. Small distal esophageal diverticulum. 3. Small hiatal hernia. An area of incomplete distension at the gastroesophageal junction for which Mild peptic induced stricture cannot be excluded. This is felt less clinically significant than dysmotility. 4. Delayed passage of 13 mm barium tablet in the mid thoracic esophagus, likely secondary to dysmotility.   Electronically Signed   By: Jeronimo GreavesKyle  Talbot M.D.   On: 11/09/2014 13:57    ASSESSMENT AND PLAN  This is a 79 y.o. year old female with past medical history of CAD s/p MI and stenting of LAD remotely and then cath 12/2012 showing patent LAD stent with 95% mid left circ stenosis s/p PCI with DES, ostial 50-60% RCA stenosis and normal LVH, HTN, diastolic CHF and GERD who [facility] with chest pain. She has a history of reflux and had a barium swallow done yesterday after her cardiac enzymes were all negative and was found to have esophageal dysmotility but a stricture could not be ruled out.  Atypical chest pain- she has pain in her jaw and cheekbones that is reminiscent of her prior angina but cardiac enzymes (trop neg x5) have been negative despite ongoing CP.  -- Still with chest pain. Will give some IV morphine. Plan for LHC today at 1pm. Remain NPO  Chest pain with dysphagia-abnormal barium swallow; history of GERD- EGD this AM with nonobstructive Schatzki's ring and 4-5 cm hiatal hernia. GI recommended proceeding with cardiac cath and if negative they will trial sucralfate and high resolution manometry  will be considered pending her cardiac evaluation  ASCAD with remote MI and PCI of LAD with cath 2014 with patent LAD s/p PCI of the left circ with residual 50-60% RCA. -- Continue ASA/plavic. Added BB today as she is hypertensive.   HTN - BPs have been elevated. Home valasartan-HCTZ being held. Will add BB (metoprolol 12.5mg  BID) in the setting of known CAD and elevated blood pressures ( HRs have been in 70-80s)  Chronic diastolic CHF -appears euvolemic  HLD- was on Livalo previously. Not on any statin currently for unknown reason. Will add back atrovastatin    SVT- TELE with NSR with brief run of atrial tach/ afib ? (8 beats). No hx of afib. Asymptomatic.  MD to review  Signed, Janetta HoraHOMPSON, Naquan Garman R PA-C  Pager [phone]  Personally seen and examined. Agree with above. Await cath. RRR  Donato Schultz, MD

## 2014-11-10 NOTE — Op Note (Signed)
[facility] [address] [place], [id number]   ENDOSCOPY PROCEDURE REPORT  PATIENT: [name], [name]  MR#: [id number] BIRTHDATE: [birth year] , 78  yrs. old GENDER: female ENDOSCOPIST:Lula Kolton Elnoria HowardHung, MD REFERRED BY: PROCEDURE DATE:  11/10/2014 PROCEDURE:   EGD, diagnostic ASA CLASS:    Class III INDICATIONS: chest pain. MEDICATION: Fentanyl 50 mcg IV and Versed 5 mg IV TOPICAL ANESTHETIC:   none  DESCRIPTION OF PROCEDURE:   After the risks and benefits of the procedure were explained, informed consent was obtained.  The Pentax Gastroscope [id number]  endoscope was introduced through the mouth  and advanced to the second portion of the duodenum .  The instrument was slowly withdrawn as the mucosa was fully examined. Estimated blood loss is zero unless otherwise noted in this procedure report.   FINDINGS: In the proximal and mid esophagus there was a moderate amount of refluxate.  No evidence of any stricture in the mid esophagus to correlate with the findings of the esophagram.  In the distal esophagus a nonobstructive Schatzki's ring was found in the setting of a 4-5 cm hiatal hernia.  I was not able to identify any distal esophageal diverticulum.  The gastric lumen and duodenum were normal.          The scope was then withdrawn from the patient and the procedure completed.  COMPLICATIONS: There were no immediate complications.  ENDOSCOPIC IMPRESSION: 1) Nonobstructive Schatzki's ring. 2) 4-5 cm hiatal hernia.  RECOMMENDATIONS: 1) Cardiac cath per Cardiology. 2) If there is no cardiac source a trial of sucralfate will be pursued. 3) High resolution manometry will be considered pending her cardiac evaluation. _______________________________ eSignedJeani Hawking:  Keante Urizar, MD 11/10/2014 8:51 AM   cc: CPT CODES: ICD CODES:  The ICD and CPT codes recommended by this software are interpretations from the data that the clinical staff has captured with  the software.  The verification of the translation of this report to the ICD and CPT codes and modifiers is the sole responsibility of the health care institution and practicing physician where this report was generated.  [employer] will not be held responsible for the validity of the ICD and CPT codes included on this report.  AMA assumes no liability for data contained or not contained herein. CPT is a Publishing rights managerregistered trademark of the [employer].  PATIENT NAME:  [name], Mariangela Y MR#: [id number]

## 2014-11-10 NOTE — Progress Notes (Signed)
[facility] PROGRESS NOTE  [name] ZOX:[id number] DOB: 28-Apr-1936 DOA: 11/08/2014   PCP: [name]., MD  Brief HPI: 79 year old African-American female with a significant history of coronary artery disease presented with chest pain. Some of the symptoms were suggestive of coronary etiology. Other symptoms for more suggestive of GI. Patient underwent EGD, which showed a hiatal hernia and nonobstructive Schatzki's ring. Cardiology was consulted and the plan is for a cardiac catheterization today.  Assessment/Plan: 1- Chest Pain  Etiology remains unclear. EGD was not conclusive. Plan is for cardiac catheterization today. If catheterization is unremarkable further GI evaluation may be necessary. Continue IV heparin for now per cardiology. Patient also on intravenous nitroglycerin. Troponins were negative. Continue aspirin. Continue Plavix.   2-  essential HTN monitor blood pressure closely.  a few of her antihypertensives were held due to borderline low blood pressures.   3- Diastolic CHF -grade 1 diastolic dysfunction w/ EF 65-70% on 2D ECHO 05/2014 -euvolemic  -NSL fluids.   3-Dysrhythmia  -? Hx/o afib in the past -EKG w/ sinus tach  4-Dysphagia;  EGD as discussed earlier. Esophageal gram showed mild esophageal dysmotility. Delayed passage of barium tablet in mid thoracic esophagus thought to be due to dysmotility.  5-right shoulder pain Present for the last couple weeks. Denies any falls or injuries. We'll proceed with x-ray.  DVT prophylaxis: Continues to be on IV heparin Code Status: Full Code.  Family Communication:  Disposition Plan: to be determine, depending on cardiology evaluation.    Consultants:  Cardiology   gastroenterology  Procedures: EGD  cardiac catheterization 3/10  Antibiotics:  none  Subjective: Patient continues to have chest discomfort in the retrosternal area. Also complains of right shoulder pain today, which she  tells me has been ongoing for about 2 weeks. Denies any falls or injuries to that region.   Objective: Filed Vitals:   11/10/14 0952  BP: 158/66  Pulse: 73  Temp:   Resp:     Intake/Output Summary (Last 24 hours) at 11/10/14 1123 Last data filed at 11/09/14 1230  Gross per 24 hour  Intake    240 ml  Output      0 ml  Net    240 ml   Filed Weights   11/08/14 1936 11/10/14 0240  Weight: 90.266 kg (199 lb) 94.8 kg (208 lb 15.9 oz)    Exam:   General:  Alert in no distress.   Cardiovascular: S 1, S 2 RRR  Respiratory: clear to auscultation bilaterally.  Abdomen: BS present, soft, nt  Musculoskeletal: no edema  Data: Basic Metabolic Panel:  Recent Labs Lab 11/08/14 1958 11/08/14 2316 11/09/14 1006 11/10/14 0601  NA 141  --  139 139  K 3.6  --  3.5 4.0  CL 99  --  106 105  CO2  --   --  30 28  GLUCOSE 153*  --  95 105*  BUN 14  --  13 9  CREATININE 0.90 0.82 0.83 0.80  CALCIUM  --   --  8.7 8.9   Liver Function Tests:  Recent Labs Lab 11/09/14 1006 11/10/14 0601  AST 20 17  ALT 15 14  ALKPHOS 76 77  BILITOT 0.5 0.3  PROT 6.2 6.3  ALBUMIN 3.2* 3.1*   CBC:  Recent Labs Lab 11/08/14 1947 11/08/14 1958 11/08/14 2316 11/09/14 1006 11/10/14 0601  WBC 6.7  --  6.5 5.4 5.5  NEUTROABS 3.5  --   --  2.4 2.3  HGB 12.0 13.6  11.3* 11.2* 11.0*  HCT 36.9 40.0 35.5* 34.9* 34.8*  MCV 81.8  --  81.1 82.9 81.3  PLT 282  --  248 228 236   Cardiac Enzymes:  Recent Labs Lab 11/08/14 2316 11/09/14 1006 11/09/14 1834 11/10/14 0044 11/10/14 0601  TROPONINI <0.03 <0.03 <0.03 <0.03 <0.03   BNP (last 3 results)  Recent Labs  09/25/14 1350  BNP 46.8    Studies: Dg Chest 2 View  11/08/2014   CLINICAL DATA:  Chest pain. Shortness of breath. Symptoms for 1 day. Initial encounter.  EXAM: CHEST  2 VIEW  COMPARISON:  09/25/2014.  FINDINGS: Low volume frontal view. Aortic arch atherosclerosis. Cardiopericardial silhouette within normal limits. Monitoring  leads project over the chest. No focal consolidation. No pleural effusion. Retrocardiac and LEFT basilar density a is little changed compared to prior, probably representing scarring. Short segment coronary artery stent incidentally noted.  IMPRESSION: Low volume chest.  Basilar atelectasis and/ or scarring.   Electronically Signed   By: Andreas NewportGeoffrey  Lamke M.D.   On: 11/08/2014 20:34   Dg Esophagus  11/09/2014   CLINICAL DATA:  Dysphagia.  Hypertension.  Coronary artery disease.  EXAM: ESOPHOGRAM/BARIUM SWALLOW  TECHNIQUE: Single contrast examination was performed using  thin barium.  FLUOROSCOPY TIME:  3 min and 1 second  COMPARISON:  Chest radiograph of 11/08/2014  FINDINGS: Single-contrast focused exam was performed in an RAO position, secondary to patient clinical status.  Evaluation of primary peristalsis demonstrates mild dysmotility with incomplete primary peristaltic wave, tertiary contractions in the lower thoracic esophagus, and mild contrast reflux into the upper thoracic esophagus. Series 1.  Full column of the esophagus demonstrates no areas of persistent narrowing or stricture superiorly. Small hiatal hernia. There is a small distal esophageal diverticulum on image 69 of series 1. Despite multiple attempts, an area of incomplete distention is identified at the gastroesophageal junction. Example image 24 of series 9.  13 mm barium tablet has delayed passage in the mid thoracic esophagus.  IMPRESSION: 1. mild esophageal dysmotility, likely presbyesophagus. 2. Small distal esophageal diverticulum. 3. Small hiatal hernia. An area of incomplete distension at the gastroesophageal junction for which Mild peptic induced stricture cannot be excluded. This is felt less clinically significant than dysmotility. 4. Delayed passage of 13 mm barium tablet in the mid thoracic esophagus, likely secondary to dysmotility.   Electronically Signed   By: Jeronimo GreavesKyle  Talbot M.D.   On: 11/09/2014 13:57    Scheduled Meds: .  ALPRAZolam  0.25 mg Oral BID  . aspirin  325 mg Oral Daily  . atorvastatin  40 mg Oral q1800  . clopidogrel  75 mg Oral Daily  . furosemide  10 mg Oral Daily  . metoprolol tartrate  12.5 mg Oral BID  . pantoprazole (PROTONIX) IV  40 mg Intravenous Q12H  . potassium chloride SA  20 mEq Oral Daily  . pregabalin  75 mg Oral BID  . sodium chloride  3 mL Intravenous Q12H   Continuous Infusions: . heparin Stopped (11/10/14 0401)  . nitroGLYCERIN 13 mcg/min (11/10/14 0241)    Active Problems:   HTN (hypertension), malignant   CAD (coronary artery disease)   Chest pain   Pain in the chest    Time spent: 35 minutes.   [facility] East CampusKRISHNAN,Cedar Roseman  [facility] Pager [phone].   If 7PM-7AM, please contact night-coverage at [email or web address], password [email or web address] 11/10/2014, 11:23 AM

## 2014-11-10 NOTE — Interval H&P Note (Signed)
Cath Lab Visit (complete for each Cath Lab visit)  Clinical Evaluation Leading to the Procedure:   ACS: No.  Non-ACS:    Anginal Classification: CCS III  Anti-ischemic medical therapy: Maximal Therapy (2 or more classes of medications)  Non-Invasive Test Results: No non-invasive testing performed  Prior CABG: No previous CABG      History and Physical Interval Note:  11/10/2014 2:42 PM  [name]  has presented today for surgery, with the diagnosis of cp  The various methods of treatment have been discussed with the patient and family. After consideration of risks, benefits and other options for treatment, the patient has consented to  Procedure(s): LEFT HEART CATHETERIZATION WITH CORONARY ANGIOGRAM (N/A) as a surgical intervention .  The patient's history has been reviewed, patient examined, no change in status, stable for surgery.  I have reviewed the patient's chart and labs.  Questions were answered to the patient's satisfaction.     Sherby Moncayo A

## 2014-11-10 NOTE — Progress Notes (Signed)
Patient scheduled for an EGD this morning and has heparin ordered.  Dr. Loreta AveMann was notified, orders given to continue with the infusion and to stop 4 hours prior to procedure.    Toya SmothersLy Charmane Protzman, RN

## 2014-11-10 NOTE — H&P (View-Only) (Signed)
Reason for Consult: Chest pain with difficulty swallowing. Referring Physician: THP. HOPI: 78 year old black female with multiple medical problems listed below, admitted for severe chest pain radiating to her jaw and cheekbones. She claims this pain feels like the cardiac pain she has experienced in the past before she had her stent placed. She has a past medical history of CAD s/p MI and stenting of LAD remotely and then cath 12/2012 showing patent LAD stent with 95% mid left circ stenosis s/p PCI with DES, ostial 50-60% RCA stenosis and normal LVH, HTN, diastolic CHF and GERD presents with chest pain. She went to [facility] for an EMG on Friday and after that she started having diffuse body aches throughout her body. The patient is worse with swallowing. It has been constant for the last 3 days.She has a history of reflux and had a barium swallow done today after her cardiac enzymes were all negative and was found to have esophageal dysmotility but a stricture could not be ruled out. She does admit to mild SOB but no LE swelling. She denies having any melena or hematochezia.    Past Medical History  Diagnosis Date  . Coronary artery disease   . Hypertension   . Anxiety   . Fibromyalgia   . Dysrhythmia     history of "irregular heart rate"'; pt. thinks it was A-fib  . Vertigo 08/27/2006    /notes 08/27/2006 (01/27/2013)  . Ventricular hypertrophy     /notes 08/27/2006 (01/27/2013)  . High cholesterol   . Anginal pain   . Exertional shortness of breath     "sometimes" (01/27/2013)  . History of stomach ulcers     "years ago" (528/2014)  . GERD (gastroesophageal reflux disease)   . Arthritis     "q where" (01/27/2013)  . Osteoporosis   . Acute myocardial infarction, unspecified site, initial episode of care   . Acute myocardial infarction of other lateral wall, initial episode of care    Past Surgical History  Procedure Laterality Date  . Hernia repair    . Laparotomy      "day after  oophorectomy; had bowel obstruction" (01/27/2013)  . Coronary angioplasty with stent placement  06/2005; 01/27/2013    stent to LAD/notes 08/27/2006; "+ 1 stent today" (01/27/2013)  . Cardiac catheterization  10/2006    /notes 08/03/2007 (01/27/2013)  . Tonsillectomy and adenoidectomy  ~ 1952  . Abdominal hysterectomy  1970's  . Laparoscopic salpingo oopherectomy  2000  . Diagnostic laparoscopy  2000    lap abdominal lysis of adhesions  . Breast lumpectomy Right 1970's    benign   . Breast biopsy Right 1970's  . Left heart catheterization with coronary angiogram N/A 01/27/2013    Procedure: LEFT HEART CATHETERIZATION WITH CORONARY ANGIOGRAM;  Surgeon: Jayadeep S Varanasi, MD;  Location: [facility] CATH LAB;  Service: Cardiovascular;  Laterality: N/A;   Family History  Problem Relation Age of Onset  . Other Mother   . Other Father    Social History:  reports that she quit smoking about 22 years ago. Her smoking use included Cigarettes. She has a 43 pack-year smoking history. She has never used smokeless tobacco. She reports that she does not drink alcohol or use illicit drugs.  Allergies:  Allergies  Allergen Reactions  . Codeine Itching    Tolerates low dose of norco  . Elavil [Amitriptyline] Other (See Comments)    Dissociation  . Adhesive [Tape] Itching and Rash   Medications: I have reviewed the   patient's current medications.  Results for orders placed or performed during the hospital encounter of 11/08/14 (from the past 48 hour(s))  CBC with Differential     Status: None   Collection Time: 11/08/14  7:47 PM  Result Value Ref Range   WBC 6.7 4.0 - 10.5 K/uL    Comment: WHITE COUNT CONFIRMED ON SMEAR   RBC 4.51 3.87 - 5.11 MIL/uL   Hemoglobin 12.0 12.0 - 15.0 g/dL   HCT 36.9 36.0 - 46.0 %   MCV 81.8 78.0 - 100.0 fL   MCH 26.6 26.0 - 34.0 pg   MCHC 32.5 30.0 - 36.0 g/dL   RDW 15.2 11.5 - 15.5 %   Platelets 282 150 - 400 K/uL   Neutrophils Relative % 53 43 - 77 %   Lymphocytes  Relative 38 12 - 46 %   Monocytes Relative 4 3 - 12 %   Eosinophils Relative 4 0 - 5 %   Basophils Relative 1 0 - 1 %   Neutro Abs 3.5 1.7 - 7.7 K/uL   Lymphs Abs 2.5 0.7 - 4.0 K/uL   Monocytes Absolute 0.3 0.1 - 1.0 K/uL   Eosinophils Absolute 0.3 0.0 - 0.7 K/uL   Basophils Absolute 0.1 0.0 - 0.1 K/uL  I-Stat Troponin, ED (not at [facility])     Status: None   Collection Time: 11/08/14  7:57 PM  Result Value Ref Range   Troponin i, poc 0.00 0.00 - 0.08 ng/mL   Comment 3            Comment: Due to the release kinetics of cTnI, a negative result within the first hours of the onset of symptoms does not rule out myocardial infarction with certainty. If myocardial infarction is still suspected, repeat the test at appropriate intervals.   I-Stat Chem 8, ED     Status: Abnormal   Collection Time: 11/08/14  7:58 PM  Result Value Ref Range   Sodium 141 135 - 145 mmol/L   Potassium 3.6 3.5 - 5.1 mmol/L   Chloride 99 96 - 112 mmol/L   BUN 14 6 - 23 mg/dL   Creatinine, Ser 0.90 0.50 - 1.10 mg/dL   Glucose, Bld 153 (H) 70 - 99 mg/dL   Calcium, Ion 1.16 1.13 - 1.30 mmol/L   TCO2 26 0 - 100 mmol/L   Hemoglobin 13.6 12.0 - 15.0 g/dL   HCT 40.0 36.0 - 46.0 %  CBC     Status: Abnormal   Collection Time: 11/08/14 11:16 PM  Result Value Ref Range   WBC 6.5 4.0 - 10.5 K/uL   RBC 4.38 3.87 - 5.11 MIL/uL   Hemoglobin 11.3 (L) 12.0 - 15.0 g/dL    Comment: DELTA CHECK NOTED REPEATED TO VERIFY    HCT 35.5 (L) 36.0 - 46.0 %   MCV 81.1 78.0 - 100.0 fL   MCH 25.8 (L) 26.0 - 34.0 pg   MCHC 31.8 30.0 - 36.0 g/dL   RDW 15.2 11.5 - 15.5 %   Platelets 248 150 - 400 K/uL  Creatinine, serum     Status: Abnormal   Collection Time: 11/08/14 11:16 PM  Result Value Ref Range   Creatinine, Ser 0.82 0.50 - 1.10 mg/dL   GFR calc non Af Amer 68 (L) >90 mL/min   GFR calc Af Amer 79 (L) >90 mL/min    Comment: (NOTE) The eGFR has been calculated using the CKD EPI equation. This calculation has not been validated  in all clinical situations.   eGFR's persistently <90 mL/min signify possible Chronic Kidney Disease.   Troponin I     Status: None   Collection Time: 11/08/14 11:16 PM  Result Value Ref Range   Troponin I <0.03 <0.031 ng/mL    Comment:        NO INDICATION OF MYOCARDIAL INJURY.   CBC WITH DIFFERENTIAL     Status: Abnormal   Collection Time: 11/09/14 10:06 AM  Result Value Ref Range   WBC 5.4 4.0 - 10.5 K/uL   RBC 4.21 3.87 - 5.11 MIL/uL   Hemoglobin 11.2 (L) 12.0 - 15.0 g/dL   HCT 34.9 (L) 36.0 - 46.0 %   MCV 82.9 78.0 - 100.0 fL   MCH 26.6 26.0 - 34.0 pg   MCHC 32.1 30.0 - 36.0 g/dL   RDW 15.4 11.5 - 15.5 %   Platelets 228 150 - 400 K/uL   Neutrophils Relative % 44 43 - 77 %   Neutro Abs 2.4 1.7 - 7.7 K/uL   Lymphocytes Relative 44 12 - 46 %   Lymphs Abs 2.4 0.7 - 4.0 K/uL   Monocytes Relative 5 3 - 12 %   Monocytes Absolute 0.3 0.1 - 1.0 K/uL   Eosinophils Relative 6 (H) 0 - 5 %   Eosinophils Absolute 0.3 0.0 - 0.7 K/uL   Basophils Relative 1 0 - 1 %   Basophils Absolute 0.0 0.0 - 0.1 K/uL  Comprehensive metabolic panel     Status: Abnormal   Collection Time: 11/09/14 10:06 AM  Result Value Ref Range   Sodium 139 135 - 145 mmol/L   Potassium 3.5 3.5 - 5.1 mmol/L   Chloride 106 96 - 112 mmol/L   CO2 30 19 - 32 mmol/L   Glucose, Bld 95 70 - 99 mg/dL   BUN 13 6 - 23 mg/dL   Creatinine, Ser 0.83 0.50 - 1.10 mg/dL   Calcium 8.7 8.4 - 10.5 mg/dL   Total Protein 6.2 6.0 - 8.3 g/dL   Albumin 3.2 (L) 3.5 - 5.2 g/dL   AST 20 0 - 37 U/L   ALT 15 0 - 35 U/L   Alkaline Phosphatase 76 39 - 117 U/L   Total Bilirubin 0.5 0.3 - 1.2 mg/dL   GFR calc non Af Amer 67 (L) >90 mL/min   GFR calc Af Amer 77 (L) >90 mL/min    Comment: (NOTE) The eGFR has been calculated using the CKD EPI equation. This calculation has not been validated in all clinical situations. eGFR's persistently <90 mL/min signify possible Chronic Kidney Disease.    Anion gap 3 (L) 5 - 15  Troponin I      Status: None   Collection Time: 11/09/14 10:06 AM  Result Value Ref Range   Troponin I <0.03 <0.031 ng/mL    Comment:        NO INDICATION OF MYOCARDIAL INJURY.     Dg Chest 2 View  11/08/2014   CLINICAL DATA:  Chest pain. Shortness of breath. Symptoms for 1 day. Initial encounter.  EXAM: CHEST  2 VIEW  COMPARISON:  09/25/2014.  FINDINGS: Low volume frontal view. Aortic arch atherosclerosis. Cardiopericardial silhouette within normal limits. Monitoring leads project over the chest. No focal consolidation. No pleural effusion. Retrocardiac and LEFT basilar density a is little changed compared to prior, probably representing scarring. Short segment coronary artery stent incidentally noted.  IMPRESSION: Low volume chest.  Basilar atelectasis and/ or scarring.   Electronically Signed   By: Arvilla Market.D.  On: 11/08/2014 20:34   Dg Esophagus  11/09/2014   CLINICAL DATA:  Dysphagia.  Hypertension.  Coronary artery disease.  EXAM: ESOPHOGRAM/BARIUM SWALLOW  TECHNIQUE: Single contrast examination was performed using  thin barium.  FLUOROSCOPY TIME:  3 min and 1 second  COMPARISON:  Chest radiograph of 11/08/2014  FINDINGS: Single-contrast focused exam was performed in an RAO position, secondary to patient clinical status.  Evaluation of primary peristalsis demonstrates mild dysmotility with incomplete primary peristaltic wave, tertiary contractions in the lower thoracic esophagus, and mild contrast reflux into the upper thoracic esophagus. Series 1.  Full column of the esophagus demonstrates no areas of persistent narrowing or stricture superiorly. Small hiatal hernia. There is a small distal esophageal diverticulum on image 69 of series 1. Despite multiple attempts, an area of incomplete distention is identified at the gastroesophageal junction. Example image 24 of series 9.  13 mm barium tablet has delayed passage in the mid thoracic esophagus.  IMPRESSION: 1. mild esophageal dysmotility, likely  presbyesophagus. 2. Small distal esophageal diverticulum. 3. Small hiatal hernia. An area of incomplete distension at the gastroesophageal junction for which Mild peptic induced stricture cannot be excluded. This is felt less clinically significant than dysmotility. 4. Delayed passage of 13 mm barium tablet in the mid thoracic esophagus, likely secondary to dysmotility.   Electronically Signed   By: Kyle  Talbot M.D.   On: 11/09/2014 13:57   Review of Systems  Constitutional: Positive for malaise/fatigue. Negative for fever, chills and weight loss.  HENT: Negative.   Eyes: Positive for pain.  Respiratory: Negative.   Cardiovascular: Positive for chest pain.  Genitourinary: Positive for urgency and frequency.  Skin: Negative.   Neurological: Positive for weakness.  Psychiatric/Behavioral: Negative for suicidal ideas, hallucinations and substance abuse.   Blood pressure 140/70, pulse 86, temperature 98.3 F (36.8 C), temperature source Oral, resp. rate 18, height 5' 7" (1.702 m), weight 90.266 kg (199 lb), SpO2 98 %. Physical Exam  Constitutional: She is oriented to person, place, and time. She appears well-developed and well-nourished.  HENT:  Head: Normocephalic and atraumatic.  Eyes: Conjunctivae and EOM are normal. Pupils are equal, round, and reactive to light.  Neck: Normal range of motion. Neck supple.  Cardiovascular: Normal rate and regular rhythm.   Respiratory: Effort normal and breath sounds normal.  GI: Soft. Bowel sounds are normal.  Musculoskeletal: Normal range of motion.  Neurological: She is alert and oriented to person, place, and time.  Skin: Skin is dry.  Psychiatric: She has a normal mood and affect. Her behavior is normal. Judgment and thought content normal.   Assessment/Plan: 1) Chest pain with dysphagia-abnormal barium swallow; history of GERD-will proceed with an EGD tomorrow. I will hold her Lovenox and her Plavix for tomorrow.  2) Small hiatal  hernia/GERD/Esophageal diverticulum.  3) CAD/HTN/History of MI.  Cheryl Stabenow 11/09/2014, 6:45 PM      

## 2014-11-10 NOTE — Progress Notes (Signed)
Patient complained of severe abdominal pain, previously had pain post procedure in cath lab that was relieved after urinating a large amount of urine. Stated she did not feel she had to urinate, had no BM in 2 days but did not think she was constipated. Blood pressure stable, sinus rhythm, groin site without bleeding, swelling, or hematoma, sheath sutured in. Notified Nada BoozerLaura Ingold NP and CT ordered. Dr. Tresa EndoKelly returned page and was notified of above assessment and CT ordered. 1905 given morphine for pain prior to CT.

## 2014-11-10 NOTE — Progress Notes (Addendum)
Site area: right groin  Site Prior to Removal:  Level 0  Pressure Applied For 20 MINUTES    Minutes Beginning at 20:45  Manual:   Yes.    Patient Status During Pull:  A X O X 3  Post Pull Groin Site:  Level 0  Post Pull Instructions Given:  Yes.    Post Pull Pulses Present:  Yes.    Dressing Applied:  Yes.    Comments:  Pt tolerated sheath removal without complications.  Will continue to monitor patient.

## 2014-11-10 NOTE — Interval H&P Note (Signed)
History and Physical Interval Note:  11/10/2014 8:21 AM  [name]  has presented today for surgery, with the diagnosis of dysphagia  The various methods of treatment have been discussed with the patient and family. After consideration of risks, benefits and other options for treatment, the patient has consented to  Procedure(s): ESOPHAGOGASTRODUODENOSCOPY (EGD) (N/A) as a surgical intervention .  The patient's history has been reviewed, patient examined, no change in status, stable for surgery.  I have reviewed the patient's chart and labs.  Questions were answered to the patient's satisfaction.     Shaketta Rill D

## 2014-11-10 NOTE — Progress Notes (Addendum)
 Patient Name: [name] Date of Encounter: 11/10/2014     Active Problems:   HTN (hypertension), malignant   CAD (coronary artery disease)   Chest pain   Pain in the chest    SUBJECTIVE  Moaning with chest tightness. She is having some SOB as well. Would like some morhpine for pain.   CURRENT MEDS . ALPRAZolam  0.25 mg Oral BID  . aspirin  325 mg Oral Daily  . clopidogrel  75 mg Oral Daily  . furosemide  10 mg Oral Daily  . pantoprazole (PROTONIX) IV  40 mg Intravenous Q12H  . potassium chloride SA  20 mEq Oral Daily  . pregabalin  75 mg Oral BID  . sodium chloride  3 mL Intravenous Q12H    OBJECTIVE  Filed Vitals:   11/10/14 0840 11/10/14 0845 11/10/14 0856 11/10/14 0900  BP: 165/98 161/69 145/66 157/76  Pulse: 81 84  82  Temp:   98.3 F (36.8 C)   TempSrc:   Oral   Resp: 15 17 14 19  Height:      Weight:      SpO2: 100% 97% 93% 98%    Intake/Output Summary (Last 24 hours) at 11/10/14 0920 Last data filed at 11/09/14 1230  Gross per 24 hour  Intake    240 ml  Output      0 ml  Net    240 ml   Filed Weights   11/08/14 1936 11/10/14 0240  Weight: 199 lb (90.266 kg) 208 lb 15.9 oz (94.8 kg)    PHYSICAL EXAM  General: Pleasant, NAD. obese Neuro: Alert and oriented X 3. Moves all extremities spontaneously. Psych: Normal affect. HEENT:  Normal  Neck: Supple without bruits or JVD. Lungs:  Resp regular and unlabored, CTA. Heart: RRR no s3, s4, or murmurs. Abdomen: Soft, non-tender, non-distended, BS + x 4.  Extremities: No clubbing, cyanosis or edema. DP/PT/Radials 2+ and equal bilaterally.  Accessory Clinical Findings  CBC  Recent Labs  11/09/14 1006 11/10/14 0601  WBC 5.4 5.5  NEUTROABS 2.4 2.3  HGB 11.2* 11.0*  HCT 34.9* 34.8*  MCV 82.9 81.3  PLT 228 236   Basic Metabolic Panel  Recent Labs  11/09/14 1006 11/10/14 0601  NA 139 139  K 3.5 4.0  CL 106 105  CO2 30 28  GLUCOSE 95 105*  BUN 13 9  CREATININE 0.83 0.80    CALCIUM 8.7 8.9   Liver Function Tests  Recent Labs  11/09/14 1006 11/10/14 0601  AST 20 17  ALT 15 14  ALKPHOS 76 77  BILITOT 0.5 0.3  PROT 6.2 6.3  ALBUMIN 3.2* 3.1*   No results for input(s): LIPASE, AMYLASE in the last 72 hours. Cardiac Enzymes  Recent Labs  11/09/14 1834 11/10/14 0044 11/10/14 0601  TROPONINI <0.03 <0.03 <0.03   BNP Invalid input(s): POCBNP D-Dimer No results for input(s): DDIMER in the last 72 hours. Hemoglobin A1C  Recent Labs  11/08/14 2316  HGBA1C 6.4*    TELE NSR with brief run of atrial tach/ afib? MD to review  Radiology/Studies  Dg Chest 2 View  11/08/2014   CLINICAL DATA:  Chest pain. Shortness of breath. Symptoms for 1 day. Initial encounter.  EXAM: CHEST  2 VIEW  COMPARISON:  09/25/2014.  FINDINGS: Low volume frontal view. Aortic arch atherosclerosis. Cardiopericardial silhouette within normal limits. Monitoring leads project over the chest. No focal consolidation. No pleural effusion. Retrocardiac and LEFT basilar density a is little changed compared to   prior, probably representing scarring. Short segment coronary artery stent incidentally noted.  IMPRESSION: Low volume chest.  Basilar atelectasis and/ or scarring.   Electronically Signed   By: Geoffrey  Lamke M.D.   On: 11/08/2014 20:34   Dg Esophagus  11/09/2014   CLINICAL DATA:  Dysphagia.  Hypertension.  Coronary artery disease.  EXAM: ESOPHOGRAM/BARIUM SWALLOW  TECHNIQUE: Single contrast examination was performed using  thin barium.  FLUOROSCOPY TIME:  3 min and 1 second  COMPARISON:  Chest radiograph of 11/08/2014  FINDINGS: Single-contrast focused exam was performed in an RAO position, secondary to patient clinical status.  Evaluation of primary peristalsis demonstrates mild dysmotility with incomplete primary peristaltic wave, tertiary contractions in the lower thoracic esophagus, and mild contrast reflux into the upper thoracic esophagus. Series 1.  Full column of the esophagus  demonstrates no areas of persistent narrowing or stricture superiorly. Small hiatal hernia. There is a small distal esophageal diverticulum on image 69 of series 1. Despite multiple attempts, an area of incomplete distention is identified at the gastroesophageal junction. Example image 24 of series 9.  13 mm barium tablet has delayed passage in the mid thoracic esophagus.  IMPRESSION: 1. mild esophageal dysmotility, likely presbyesophagus. 2. Small distal esophageal diverticulum. 3. Small hiatal hernia. An area of incomplete distension at the gastroesophageal junction for which Mild peptic induced stricture cannot be excluded. This is felt less clinically significant than dysmotility. 4. Delayed passage of 13 mm barium tablet in the mid thoracic esophagus, likely secondary to dysmotility.   Electronically Signed   By: Kyle  Talbot M.D.   On: 11/09/2014 13:57    ASSESSMENT AND PLAN  This is a 79 y.o. year old female with past medical history of CAD s/p MI and stenting of LAD remotely and then cath 12/2012 showing patent LAD stent with 95% mid left circ stenosis s/p PCI with DES, ostial 50-60% RCA stenosis and normal LVH, HTN, diastolic CHF and GERD who presented to [facility] on 11/10/14 with chest pain. She has a history of reflux and had a barium swallow done yesterday after her cardiac enzymes were all negative and was found to have esophageal dysmotility but a stricture could not be ruled out.  Atypical chest pain- she has pain in her jaw and cheekbones that is reminiscent of her prior angina but cardiac enzymes (trop neg x5) have been negative despite ongoing CP.  -- Still with chest pain. Will give some IV morphine. Plan for LHC today at 1pm. Remain NPO  Chest pain with dysphagia-abnormal barium swallow; history of GERD- EGD this AM with nonobstructive Schatzki's ring and 4-5 cm hiatal hernia. GI recommended proceeding with cardiac cath and if negative they will trial sucralfate and high resolution manometry  will be considered pending her cardiac evaluation  ASCAD with remote MI and PCI of LAD with cath 2014 with patent LAD s/p PCI of the left circ with residual 50-60% RCA. -- Continue ASA/plavic. Added BB today as she is hypertensive.   HTN - BPs have been elevated. Home valasartan-HCTZ being held. Will add BB (metoprolol 12.5mg BID) in the setting of known CAD and elevated blood pressures ( HRs have been in 70-80s)  Chronic diastolic CHF -appears euvolemic  HLD- was on Livalo previously. Not on any statin currently for unknown reason. Will add back atrovastatin    SVT- TELE with NSR with brief run of atrial tach/ afib ? (8 beats). No hx of afib. Asymptomatic.  MD to review  Signed, THOMPSON, KATHRYN R PA-C    Pager [phone]  Personally seen and examined. Agree with above. Await cath. RRR  Adelis Docter, MD  

## 2014-11-10 NOTE — CV Procedure (Addendum)
[name] is a 79 y.o. female   [id number]  [id number] LOCATION:  FACILITY: [facility]  PHYSICIAN: Troy Sine, MD, [facility] [birth year]   DATE OF PROCEDURE:  11/10/2014     CARDIAC CATHETERIZATION/PERCUTANEOUS CORONARY INTERVENTION    HISTORY:   [name] is a 79 year old after an American female who underwent stenting of her mid LAD in 2006 with a 2.515 mm Cypher stent, and at last catheterization in May 2014 her LAD stent was patent and she had new 95% circumflex stenosis for which she underwent successful DES stenting.  She also had 50-60% RCA stenosis.  She was  admitted with recurrent episodes of chest pain.  She is referred for repeat cardiac catheterization and possible intervention.   PROCEDURE:  Left heart catheterization via the radial approach: Coronary angiography, left ventricular pressure recording, fractional flow reserve of the LAD with a Navvus microcatheter, Angiosculpt scoring balloon and DES stenting of the mid LAD.  The patient was brought to the second floor [facility] cath lab in the postabsorptive state. The patient was premedicated with Versed 2 mg and fentanyl 25 mcg. A right radial approach was utilized after an Allen's test verified adequate circulation. The right radial artery was punctured via the Seldinger technique, and a 6 [place] Glidesheath Slender was inserted without difficulty.  A radial cocktail consisting of Verapamil, IV nitroglycerin, and lidocaine was administered. Weight adjusted heparin was administered. A Versicore wire was advanced into the ascending aorta. Diagnostic catheterization was done with a 5 [place] TIG 4.0 multiple right catheters were attempted to cannulate the RCA and ultimately a Launcher 5 [place] JR4 was excess successful.  A 5 French pigtail catheter was unable to cross aortic valve.  Left ventricular pressures were recorded with diagnostic catheter.  With the demonstration of in-stent restenosis in the previously  placed  2.515 mm Cypher stent from 2006 it was felt that FFR was necessary to determine if the stenosis was physiologically significant.  Multiple guiding catheters were used from the radial access site, but access to the left coronary system be the radial approach was difficult due to the significant proximal vessel tortuosity.  Ultimately, the radial approach was aborted.  The right groin was prepped and draped in sterile fashion.  A 6 French sheath was inserted without difficulty with one stick.  The 6 [place] XB LAD 3.5 guide had been used from the radial approach was  successful in cannulating the left main without difficulty.  Additional 3000 units of heparin was administered. ACT was documented to be therapeutic.  An ACIST Navvus micro catheter was used over a Buyer, retail which had been passed across the mid LAD stent distally.  Normalization was done several centimeters beyond the guiding catheter.  The catheter was then advanced distal to the stenosis.  Adenosine infusion was administer per protocol.  FFR was obtained distal to the lesion, which was significant at 0.75.  PCI was therefore performed.  Angiomax bolus plus infusion was a Company secretary.  The patient had a radial received her Plavix this morning for antiplatelet therapy.  The navvus system was removed.  Angiosculpt 2.515 mm scoring balloon was inserted and several inflations were made at 8 and ultimately 10 atm.  A 2.75 x 18 mm Resolute DES stent was then inserted issue and with both ends just outside the previously placed stent.  This was dilated and deployed at 13 and 14 atm.  A 3.015 mm [place] Euphora balloon was used for post stent dilatation up to 3.05  mm.  Angiography confirmed an excellent result.  A TR radial band was applied for hemostasis to the right radial artery and the femoral arterial sheath was sutured in place.  Bivalirudin was discontinued at the completion of the procedure. The patient left the catheterization laboratory in stable  condition.   HEMODYNAMICS:   Central Aorta: 185/86  Left Ventricle: 185/28  ANGIOGRAPHY:   The left main coronary artery was a moderate size vessel which bifurcated into the LAD and left circumflex coronary artery.  The LAD was a moderate sized vessel that gave rise to several septal perforating arteries and possible to mid diagonals.  In the mid LAD was the previously placed 2.515 mm Cypher stent from 2006.  This had diffuse 70-75% in-stent restenosis.  The LAD extended to and wrapped around the LV apex and had TIMI-3 flow.  The left circumflex coronary artery .  A moderate size vessel.  It was smooth 60-70% ostial stenosis.  The previously placed stent in the AV groove proximal to the OM takeoff was widely patent without restenosis.  The vessel supplying one major obtuse marginal branch.   The RCA was a almond vessel that gave rise to a large PDA and PLA vessel.  There was smooth 40-50% mid RCA narrowing.  Fractional flow reserve LAD was significant at 0.75.  Following PCI with Angiosculpt scoring balloon and another a 2.7518 mm Resolute DES stent postdilated to 3.05 mm, the 70-75% diffuse in-stent restenosis was reduced to 0%.  There was brisk TIMI-3 flow.  There was no evidence for dissection.   IMPRESSION:  Three-vessel coronary obstructive disease with 75% diffuse in-stent restenosis in the mid LAD stent (Cypher 2.515 mm, placed in 2006); 60-70% ostial stenosis of the left circumflex coronary artery with a widely patent stent in the circumflex vessel; and 40-50% mid RCA stenosis.  Physiologically significant fractional flow reserve the mid LAD in-stent restenosis at 0.75.  Successful PCI with Angiosculpt scoring balloon, and ultimate DES stenting with a 2.7518 mm Resolute integrity stent postdilated to 3.05 mm with the 75% stenosis being reduced to 0%.   RECOMMENDATION:  Medical therapy for concomitant CAD involving the circumflex and RCA.  The patient will continue on  dual antiplatelet therapy for minimum of a year and based on DAPT data may benefit from long-term treatment.   Troy Sine, MD, [facility] 11/10/2014 5:37 PM

## 2014-11-11 ENCOUNTER — Encounter (HOSPITAL_COMMUNITY): Payer: Self-pay | Admitting: Cardiovascular Disease

## 2014-11-11 DIAGNOSIS — M25511 Pain in right shoulder: Secondary | ICD-10-CM

## 2014-11-11 DIAGNOSIS — I209 Angina pectoris, unspecified: Secondary | ICD-10-CM

## 2014-11-11 DIAGNOSIS — K222 Esophageal obstruction: Secondary | ICD-10-CM

## 2014-11-11 DIAGNOSIS — K449 Diaphragmatic hernia without obstruction or gangrene: Secondary | ICD-10-CM

## 2014-11-11 LAB — BASIC METABOLIC PANEL
Anion gap: 6 (ref 5–15)
BUN: 6 mg/dL (ref 6–23)
CHLORIDE: 106 mmol/L (ref 96–112)
CO2: 28 mmol/L (ref 19–32)
Calcium: 8.6 mg/dL (ref 8.4–10.5)
Creatinine, Ser: 1.02 mg/dL (ref 0.50–1.10)
GFR calc Af Amer: 60 mL/min — ABNORMAL LOW (ref >90)
GFR calc non Af Amer: 52 mL/min — ABNORMAL LOW (ref >90)
GLUCOSE: 129 mg/dL — AB (ref 70–99)
Potassium: 4.2 mmol/L (ref 3.5–5.1)
SODIUM: 140 mmol/L (ref 135–145)

## 2014-11-11 LAB — CBC
HCT: 32.3 % — ABNORMAL LOW (ref 36.0–46.0)
Hemoglobin: 10.2 g/dL — ABNORMAL LOW (ref 12.0–15.0)
MCH: 26.2 pg (ref 26.0–34.0)
MCHC: 31.6 g/dL (ref 30.0–36.0)
MCV: 83 fL (ref 78.0–100.0)
PLATELETS: 217 10*3/uL (ref 150–400)
RBC: 3.89 MIL/uL (ref 3.87–5.11)
RDW: 15.4 % (ref 11.5–15.5)
WBC: 4.5 10*3/uL (ref 4.0–10.5)

## 2014-11-11 MED ORDER — MAGNESIUM HYDROXIDE 400 MG/5ML PO SUSP
30.0000 mL | Freq: Every day | ORAL | Status: DC | PRN
  Administered 2014-11-11: 10:00:00 30 mL via ORAL
  Filled 2014-11-11: qty 30

## 2014-11-11 MED ORDER — HYDROCODONE-ACETAMINOPHEN 5-325 MG PO TABS
0.5000 | ORAL_TABLET | ORAL | Status: DC | PRN
  Administered 2014-11-11 – 2014-11-12 (×5): 1 via ORAL
  Filled 2014-11-11 (×4): qty 1

## 2014-11-11 MED ORDER — DOCUSATE SODIUM 100 MG PO CAPS
100.0000 mg | ORAL_CAPSULE | Freq: Every day | ORAL | Status: DC | PRN
Start: 2014-11-11 — End: 2014-11-12
  Administered 2014-11-11: 11:00:00 100 mg via ORAL
  Filled 2014-11-11 (×3): qty 1

## 2014-11-11 MED ORDER — PANTOPRAZOLE SODIUM 40 MG PO TBEC
40.0000 mg | DELAYED_RELEASE_TABLET | Freq: Two times a day (BID) | ORAL | Status: DC
  Administered 2014-11-11 – 2014-11-12 (×2): 40 mg via ORAL
  Filled 2014-11-11 (×2): qty 1

## 2014-11-11 MED ORDER — BISACODYL 10 MG RE SUPP
10.0000 mg | Freq: Once | RECTAL | Status: AC
  Administered 2014-11-11: 10 mg via RECTAL
  Filled 2014-11-11: qty 1

## 2014-11-11 MED ORDER — FUROSEMIDE 10 MG/ML IJ SOLN
20.0000 mg | Freq: Once | INTRAMUSCULAR | Status: AC
  Administered 2014-11-11: 20 mg via INTRAVENOUS
  Filled 2014-11-11: qty 2

## 2014-11-11 MED ORDER — ALPRAZOLAM 0.25 MG PO TABS
0.2500 mg | ORAL_TABLET | Freq: Once | ORAL | Status: AC
  Administered 2014-11-11: 0.25 mg via ORAL
  Filled 2014-11-11: qty 1

## 2014-11-11 MED FILL — Sodium Chloride IV Soln 0.9%: INTRAVENOUS | Qty: 50 | Status: AC

## 2014-11-11 NOTE — Progress Notes (Signed)
Occupational Therapy Evaluation Patient Details Name: [name] MRN: [id number] DOB: 1936/02/17 Today's Date: 11/11/2014    History of Present Illness This is a 79 y.o. year old female with significant past medical history of CAD s/p MI and stenting, HTN, diastolic CHF, GERD, dysrhythmia presenting with chest pain. Pt reports central chest pain w/ radiation to L jaw over last 1-2 days.   Clinical Impression   Patient at or very close to baseline with ADLs. She describes significant R shoulder pain on interview, but during functional activities she is able to use her R UE functionally with no complaints. Recommend she follow up with outpatient PT for R shoulder pain.    Follow Up Recommendations  Other (comment) (Outpatient PT for R shoulder pain)    Equipment Recommendations  None recommended by OT (has all needed equipment)    Recommendations for Other Services       Precautions / Restrictions Precautions Precautions: None Restrictions Weight Bearing Restrictions: No      Mobility Bed Mobility Overal bed mobility: Modified Independent                Transfers Overall transfer level: Modified independent Equipment used: 4-wheeled walker                  Balance                                            ADL Overall ADL's : At baseline Eating/Feeding: Independent   Grooming: Wash/dry hands;Standing;Modified independent   Upper Body Bathing: Set up   Lower Body Bathing: Set up   Upper Body Dressing : Set up   Lower Body Dressing: Set up   Toilet Transfer: Modified Independent;Ambulation;Regular Toilet   Toileting- Clothing Manipulation and Hygiene: Modified independent;Sit to/from stand       Functional mobility during ADLs: Modified independent (ambulation to/from bathroom with husband's Rollator ) General ADL Comments: Patient reports that she feels she can care for herself at home. She had multiple complaints  regarding R shoulder, R leg, R hip, but she moves well. No unsafe behaviors noted. She reports she is going home tomorrow. I recommend she follow up with outpatient PT for her R shoulder pain.     Vision     Perception     Praxis      Pertinent Vitals/Pain Pain Assessment: 0-10 Pain Score: 9  Pain Location: R shoulder Pain Descriptors / Indicators: Aching;Sore Pain Intervention(s): Monitored during session;Limited activity within patient's tolerance     Hand Dominance Right   Extremity/Trunk Assessment Upper Extremity Assessment Upper Extremity Assessment: RUE deficits/detail RUE Deficits / Details: Patient first reported that any movement of her shoulder caused significant pain. During evaluation, when arm not being tested, she used it to steady herself, toilet, and obtain supplies at sink when she washed her hands. She can move it to 90 degrees of flexion on command as well.   Lower Extremity Assessment Lower Extremity Assessment: Defer to PT evaluation   Cervical / Trunk Assessment Cervical / Trunk Assessment: Normal   Communication Communication Communication: No difficulties   Cognition Arousal/Alertness: Awake/alert Behavior During Therapy: WFL for tasks assessed/performed Overall Cognitive Status: Within Functional Limits for tasks assessed                     General Comments  Exercises       Shoulder Instructions      Home Living Family/patient expects to be discharged to:: Private residence Living Arrangements: Spouse/significant other Available Help at Discharge: Family;Available PRN/intermittently Type of Home: House Home Access: Stairs to enter Entergy CorporationEntrance Stairs-Number of Steps: 8 Entrance Stairs-Rails: Right Home Layout: One level     Bathroom Shower/Tub: Producer, television/film/videoWalk-in shower   Bathroom Toilet: Handicapped height     Home Equipment: Environmental consultantWalker - 2 wheels;Walker - 4 wheels;Shower seat;Grab bars - tub/shower   Additional Comments: husband  has dementia      Prior Functioning/Environment Level of Independence: Independent with assistive device(s)             OT Diagnosis: Acute pain   OT Problem List: Pain   OT Treatment/Interventions:      OT Goals(Current goals can be found in the care plan section) Acute Rehab OT Goals Patient Stated Goal: none stated OT Goal Formulation: All assessment and education complete, DC therapy  OT Frequency:     Barriers to D/C:            Co-evaluation              End of Session Equipment Utilized During Treatment: Rolling walker  Activity Tolerance: Patient tolerated treatment well Patient left: in bed;with call bell/phone within reach;with bed alarm set;with family/visitor present   Time: [id number] OT Time Calculation (min): 24 min Charges:  OT General Charges $OT Visit: 1 Procedure OT Evaluation $Initial OT Evaluation Tier I: 1 Procedure OT Treatments $Self Care/Home Management : 8-22 mins G-Codes:    Ieesha Abbasi A 11/11/2014, 4:57 PM

## 2014-11-11 NOTE — Progress Notes (Signed)
[facility] PROGRESS NOTE  [name] XBM:[id number] DOB: [birth year] DOA: 11/08/2014   PCP: [name]., MD  Brief HPI: 79 year old African-American female with a significant history of coronary artery disease presented with chest pain. Some of the symptoms were suggestive of coronary etiology. Other symptoms for more suggestive of GI. Patient underwent EGD, which showed a hiatal hernia and nonobstructive Schatzki's ring. Cardiology was consulted and underwent cardiac catheterization on 3/10 with placement of drug-eluting stent in the LAD.  Assessment/Plan: 1- Chest Pain  Appears to be cardiac in etiology based on cardiac catheterization. Her drug-eluting stent was placed in the LAD. Continue aspirin and Plavix. Heparin has been discontinued along with nitroglycerin. Patient to be mobilized today.  2-  essential HTN monitor blood pressure closely.  a few of her antihypertensives were held due to borderline low blood pressures.   3- Diastolic CHF -grade 1 diastolic dysfunction w/ EF 65-70% on 2D ECHO 05/2014 -Thought to be fluid overloaded this morning. She was given a dose of Lasix.  3-Dysrhythmia  -EKG w/ sinus tach  4-Dysphagia;  EGD as discussed earlier. Esophageal gram showed mild esophageal dysmotility. Delayed passage of barium tablet in mid thoracic esophagus thought to be due to dysmotility. No further GI workup to be pursued at this time. Patient developed abdominal pain overnight and underwent a CT scan which was unremarkable for any acute process.  5-right shoulder pain Present for the last couple weeks. Denies any falls or injuries. Imaging studies were done yesterday, which are unremarkable. PT to see. In control. Follow-up with outpatient provider.    DVT prophylaxis: Subcutaneous heparin Code Status: Full Code.  Family Communication: Discussed with the patient and her husband Disposition Plan: Mobilize. Anticipate discharge tomorrow.     Consultants:  Cardiology   gastroenterology  Procedures: EGD cardiac catheterization 3/10  Antibiotics:  none  Subjective: Patient complains of pain in the right shoulder area. Radiating to the neck. Denies any difficulty with movement of the neck. No chest pain or shortness of breath.   Objective: Filed Vitals:   11/11/14 0817  BP: 131/45  Pulse: 87  Temp: 98.6 F (37 C)  Resp: 18    Intake/Output Summary (Last 24 hours) at 11/11/14 1113 Last data filed at 11/11/14 0819  Gross per 24 hour  Intake 2087.25 ml  Output    600 ml  Net 1487.25 ml   Filed Weights   11/08/14 1936 11/10/14 0240 11/11/14 0400  Weight: 90.266 kg (199 lb) 94.8 kg (208 lb 15.9 oz) 99.1 kg (218 lb 7.6 oz)    Exam:   General:  Alert in no distress.   Cardiovascular: S 1, S 2 RRR  Respiratory: clear to auscultation bilaterally.  Abdomen: BS present, soft, nt  Musculoskeletal: no edema. Limited range of motion of the right shoulder.  Data: Basic Metabolic Panel:  Recent Labs Lab 11/08/14 1958 11/08/14 2316 11/09/14 1006 11/10/14 0601 11/10/14 1825 11/11/14 0348  NA 141  --  139 139  --  140  K 3.6  --  3.5 4.0  --  4.2  CL 99  --  106 105  --  106  CO2  --   --  30 28  --  28  GLUCOSE 153*  --  95 105*  --  129*  BUN 14  --  13 9  --  6  CREATININE 0.90 0.82 0.83 0.80 0.86 1.02  CALCIUM  --   --  8.7 8.9  --  8.6  Liver Function Tests:  Recent Labs Lab 11/09/14 1006 11/10/14 0601  AST 20 17  ALT 15 14  ALKPHOS 76 77  BILITOT 0.5 0.3  PROT 6.2 6.3  ALBUMIN 3.2* 3.1*   CBC:  Recent Labs Lab 11/08/14 1947  11/08/14 2316 11/09/14 1006 11/10/14 0601 11/10/14 1825 11/11/14 0348  WBC 6.7  --  6.5 5.4 5.5 5.8 4.5  NEUTROABS 3.5  --   --  2.4 2.3  --   --   HGB 12.0  < > 11.3* 11.2* 11.0* 11.1* 10.2*  HCT 36.9  < > 35.5* 34.9* 34.8* 34.7* 32.3*  MCV 81.8  --  81.1 82.9 81.3 81.8 83.0  PLT 282  --  248 228 236 226 217  < > = values in this interval not  displayed. Cardiac Enzymes:  Recent Labs Lab 11/08/14 2316 11/09/14 1006 11/09/14 1834 11/10/14 0044 11/10/14 0601  TROPONINI <0.03 <0.03 <0.03 <0.03 <0.03   BNP (last 3 results)  Recent Labs  09/25/14 1350  BNP 46.8    Studies: Ct Abdomen Pelvis Wo Contrast  11/10/2014   CLINICAL DATA:  Right lower quadrant abdominal pain post cardiac catheterization today. Initial encounter.  EXAM: CT ABDOMEN AND PELVIS WITHOUT CONTRAST  TECHNIQUE: Multidetector CT imaging of the abdomen and pelvis was performed following the standard protocol without IV contrast.  COMPARISON:  Abdominal pelvic CT 03/26/2013.  FINDINGS: Lower chest: Mild bibasilar atelectasis. Small bilateral pleural effusions. There is atherosclerosis of the aorta and coronary arteries. A small hiatal hernia is again noted.  Hepatobiliary: Stable appearance without demonstrated focal abnormality. No evidence of gallstones, gallbladder wall thickening or biliary dilatation.  Pancreas: Mild pancreatic atrophy and fatty replacement. No acute abnormality.  Spleen: Normal in size without focal abnormality.  Adrenals/Urinary Tract: Both adrenal glands appear normal.There is contrast in the collecting systems attributed to the pre seeding catheterization. Stable small left renal cyst and mild right renal cortical thinning. No hydronephrosis. No significant bladder findings.  Stomach/Bowel: No evidence of bowel wall thickening, distention or surrounding inflammatory change.There is dense contrast material within the right colon. Diverticular changes of the distal colon are present without surrounding inflammation.  Vascular/Lymphatic: There are no enlarged abdominal or pelvic lymph nodes. Stable aortoiliac atherosclerosis. Right femoral venous line is in place. There is soft tissue stranding within the subcutaneous fat of the right groin consistent with a small groin hematoma. This demonstrates no significant intrapelvic extension. There is no  retroperitoneal hematoma.  Reproductive: Status post hysterectomy. No evidence of adnexal mass.  Other: No evidence of abdominal wall mass or hernia. Injection granulomas again noted in both buttocks.  Musculoskeletal: No acute or significant osseous findings. Lower lumbar spondylosis noted.  IMPRESSION: 1. Small superficial hematoma within the subcutaneous fat of the right groin. No pelvic or retroperitoneal hematoma. 2. No acute abdominal findings. Atherosclerosis, left renal cyst and distal colonic diverticulosis noted. 3. Mild bibasilar atelectasis and small bilateral pleural effusions.   Electronically Signed   By: Carey BullocksWilliam  Veazey M.D.   On: 11/10/2014 20:30   Dg Shoulder Right  11/10/2014   CLINICAL DATA:  Sharp right shoulder and humerus pain, intermittent for 1 week.  EXAM: RIGHT SHOULDER - 2+ VIEW  COMPARISON:  None.  FINDINGS: No fracture or dislocation is identified about the shoulder. No destructive osseous lesion is seen. No significant degenerative changes identified. A few small calcifications are noted in the soft tissues at the lateral aspect of the shoulder and upper arm.  IMPRESSION: No acute osseous abnormality  identified.   Electronically Signed   By: Sebastian Ache   On: 11/10/2014 13:38   Dg Esophagus  11/09/2014   CLINICAL DATA:  Dysphagia.  Hypertension.  Coronary artery disease.  EXAM: ESOPHOGRAM/BARIUM SWALLOW  TECHNIQUE: Single contrast examination was performed using  thin barium.  FLUOROSCOPY TIME:  3 min and 1 second  COMPARISON:  Chest radiograph of 11/08/2014  FINDINGS: Single-contrast focused exam was performed in an RAO position, secondary to patient clinical status.  Evaluation of primary peristalsis demonstrates mild dysmotility with incomplete primary peristaltic wave, tertiary contractions in the lower thoracic esophagus, and mild contrast reflux into the upper thoracic esophagus. Series 1.  Full column of the esophagus demonstrates no areas of persistent narrowing or  stricture superiorly. Small hiatal hernia. There is a small distal esophageal diverticulum on image 69 of series 1. Despite multiple attempts, an area of incomplete distention is identified at the gastroesophageal junction. Example image 24 of series 9.  13 mm barium tablet has delayed passage in the mid thoracic esophagus.  IMPRESSION: 1. mild esophageal dysmotility, likely presbyesophagus. 2. Small distal esophageal diverticulum. 3. Small hiatal hernia. An area of incomplete distension at the gastroesophageal junction for which Mild peptic induced stricture cannot be excluded. This is felt less clinically significant than dysmotility. 4. Delayed passage of 13 mm barium tablet in the mid thoracic esophagus, likely secondary to dysmotility.   Electronically Signed   By: Jeronimo Greaves M.D.   On: 11/09/2014 13:57   Dg Humerus Right  11/10/2014   CLINICAL DATA:  Sharp right shoulder and humerus pain, intermittent for 1 week.  EXAM: RIGHT HUMERUS - 2+ VIEW  COMPARISON:  None.  FINDINGS: The humerus appears intact without evidence of fracture. The elbow is located. No destructive osseous lesion is seen. A few small soft tissue calcifications are noted in the mid and upper arm. An IV is noted in the antecubital fossa.  IMPRESSION: Negative.   Electronically Signed   By: Sebastian Ache   On: 11/10/2014 13:39    Scheduled Meds: . adenosine  16 mL Intravenous To Cath  . ALPRAZolam  0.25 mg Oral BID  . aspirin EC  81 mg Oral Daily  . atorvastatin  40 mg Oral q1800  . clopidogrel  75 mg Oral Q breakfast  . furosemide  10 mg Oral Daily  . heparin  5,000 Units Subcutaneous 3 times per day  . metoprolol tartrate  12.5 mg Oral BID  . pantoprazole  40 mg Oral BID  . potassium chloride SA  20 mEq Oral Daily  . pregabalin  75 mg Oral BID  . sodium chloride  3 mL Intravenous Q12H   Continuous Infusions:    Principal Problem:   Pain in the chest Active Problems:   HTN (hypertension), malignant   CAD (coronary  artery disease)   Dysphagia   Essential hypertension   Schatzki's ring   Hiatal hernia    Time spent: 35 minutes.   [facility]  [facility] Pager [phone].   If 7PM-7AM, please contact night-coverage at [email or web address], password [facility] 11/11/2014, 11:13 AM  LOS: 1 day

## 2014-11-11 NOTE — Progress Notes (Signed)
Patient Name: [name] Date of Encounter: 11/11/2014  Primary cardiologist: Dr. Katrinka Blazing   Principal Problem:   Pain in the chest Active Problems:   HTN (hypertension), malignant   CAD (coronary artery disease)   Dysphagia   Essential hypertension   Schatzki's ring   Hiatal hernia    SUBJECTIVE  No recurrent chest pain last night. Post procedure had some RLQ pain, CT negative for bleeding, small hematoma noted in R groin. Abdominal somewhat improved, states has not had a BM in 2 days which is not normal per her. No significant SOB  CURRENT MEDS . adenosine  16 mL Intravenous To Cath  . ALPRAZolam  0.25 mg Oral BID  . aspirin EC  81 mg Oral Daily  . atorvastatin  40 mg Oral q1800  . clopidogrel  75 mg Oral Daily  . clopidogrel  75 mg Oral Q breakfast  . furosemide  10 mg Oral Daily  . heparin  5,000 Units Subcutaneous 3 times per day  . metoprolol tartrate  12.5 mg Oral BID  . pantoprazole (PROTONIX) IV  40 mg Intravenous Q12H  . potassium chloride SA  20 mEq Oral Daily  . pregabalin  75 mg Oral BID  . sodium chloride  3 mL Intravenous Q12H    OBJECTIVE  Filed Vitals:   11/11/14 0100 11/11/14 0200 11/11/14 0400 11/11/14 0505  BP: 90/45 108/54  148/53  Pulse: 77 73    Temp:   98.4 F (36.9 C)   TempSrc:   Oral   Resp: Height:      Weight:   218 lb 7.6 oz (99.1 kg)   SpO2: 99% 99%      Intake/Output Summary (Last 24 hours) at 11/11/14 0650 Last data filed at 11/10/14 1900  Gross per 24 hour  Intake 229.25 ml  Output      0 ml  Net 229.25 ml   Filed Weights   11/08/14 1936 11/10/14 0240 11/11/14 0400  Weight: 199 lb (90.266 kg) 208 lb 15.9 oz (94.8 kg) 218 lb 7.6 oz (99.1 kg)    PHYSICAL EXAM  General: Pleasant, NAD. Neuro: Alert and oriented X 3. Moves all extremities spontaneously. Psych: Normal affect. HEENT:  Normal  Neck: Supple without bruits or JVD. Lungs:  Resp regular and unlabored. Mild bibasilar rale.  Heart: RRR no  s3, s4, or murmurs. R radial cath site stable, R groin has small hematoma, no bleeding.  Abdomen: Soft, non-tender, non-distended, BS + x 4.  Extremities: No clubbing, cyanosis or edema. DP/PT/Radials 2+ and equal bilaterally.  Accessory Clinical Findings  CBC  Recent Labs  11/09/14 1006 11/10/14 0601 11/10/14 1825 11/11/14 0348  WBC 5.4 5.5 5.8 4.5  NEUTROABS 2.4 2.3  --   --   HGB 11.2* 11.0* 11.1* 10.2*  HCT 34.9* 34.8* 34.7* 32.3*  MCV 82.9 81.3 81.8 83.0  PLT 228 236 226 217   Basic Metabolic Panel  Recent Labs  11/10/14 0601 11/10/14 1825 11/11/14 0348  NA 139  --  140  K 4.0  --  4.2  CL 105  --  106  CO2 28  --  28  GLUCOSE 105*  --  129*  BUN 9  --  6  CREATININE 0.80 0.86 1.02  CALCIUM 8.9  --  8.6   Liver Function Tests  Recent Labs  11/09/14 1006 11/10/14 0601  AST 20 17  ALT 15 14  ALKPHOS 76 77  BILITOT 0.5 0.3  PROT 6.2 6.3  ALBUMIN 3.2* 3.1*   Cardiac Enzymes  Recent Labs  11/09/14 1834 11/10/14 0044 11/10/14 0601  TROPONINI <0.03 <0.03 <0.03   Hemoglobin A1C  Recent Labs  11/08/14 2316  HGBA1C 6.4*    TELE NSR with occasional PVCs    ECG  NSR with TWI in anterolateral leads  Echocardiogram 11/09/2014  Left ventricle: The cavity size was normal. There was moderate focal basal and mild concentric hypertrophy. Systolic function was normal. The estimated ejection fraction was in the range of 60% to 65%. Wall motion was normal; there were no regional wall motion abnormalities. There was an increased relative contribution of atrial contraction to ventricular filling. Doppler parameters are consistent with abnormal left ventricular relaxation (grade 1 diastolic dysfunction). - Tricuspid valve: There was mild-moderate regurgitation. - Pulmonic valve: There was mild regurgitation. - Pulmonary arteries: PA peak pressure: 32 mm Hg (S).    Radiology/Studies  Ct Abdomen Pelvis Wo Contrast  11/10/2014    CLINICAL DATA:  Right lower quadrant abdominal pain post cardiac catheterization today. Initial encounter.  EXAM: CT ABDOMEN AND PELVIS WITHOUT CONTRAST  TECHNIQUE: Multidetector CT imaging of the abdomen and pelvis was performed following the standard protocol without IV contrast.  COMPARISON:  Abdominal pelvic CT 03/26/2013.  FINDINGS: Lower chest: Mild bibasilar atelectasis. Small bilateral pleural effusions. There is atherosclerosis of the aorta and coronary arteries. A small hiatal hernia is again noted.  Hepatobiliary: Stable appearance without demonstrated focal abnormality. No evidence of gallstones, gallbladder wall thickening or biliary dilatation.  Pancreas: Mild pancreatic atrophy and fatty replacement. No acute abnormality.  Spleen: Normal in size without focal abnormality.  Adrenals/Urinary Tract: Both adrenal glands appear normal.There is contrast in the collecting systems attributed to the pre seeding catheterization. Stable small left renal cyst and mild right renal cortical thinning. No hydronephrosis. No significant bladder findings.  Stomach/Bowel: No evidence of bowel wall thickening, distention or surrounding inflammatory change.There is dense contrast material within the right colon. Diverticular changes of the distal colon are present without surrounding inflammation.  Vascular/Lymphatic: There are no enlarged abdominal or pelvic lymph nodes. Stable aortoiliac atherosclerosis. Right femoral venous line is in place. There is soft tissue stranding within the subcutaneous fat of the right groin consistent with a small groin hematoma. This demonstrates no significant intrapelvic extension. There is no retroperitoneal hematoma.  Reproductive: Status post hysterectomy. No evidence of adnexal mass.  Other: No evidence of abdominal wall mass or hernia. Injection granulomas again noted in both buttocks.  Musculoskeletal: No acute or significant osseous findings. Lower lumbar spondylosis noted.   IMPRESSION: 1. Small superficial hematoma within the subcutaneous fat of the right groin. No pelvic or retroperitoneal hematoma. 2. No acute abdominal findings. Atherosclerosis, left renal cyst and distal colonic diverticulosis noted. 3. Mild bibasilar atelectasis and small bilateral pleural effusions.   Electronically Signed   By: Carey BullocksWilliam  Veazey M.D.   On: 11/10/2014 20:30   Dg Chest 2 View  11/08/2014   CLINICAL DATA:  Chest pain. Shortness of breath. Symptoms for 1 day. Initial encounter.  EXAM: CHEST  2 VIEW  COMPARISON:  09/25/2014.  FINDINGS: Low volume frontal view. Aortic arch atherosclerosis. Cardiopericardial silhouette within normal limits. Monitoring leads project over the chest. No focal consolidation. No pleural effusion. Retrocardiac and LEFT basilar density a is little changed compared to prior, probably representing scarring. Short segment coronary artery stent incidentally noted.  IMPRESSION: Low volume chest.  Basilar atelectasis and/ or scarring.   Electronically Signed   By: Juliene PinaGeoffrey  Lamke M.D.   On: 11/08/2014 20:34   Dg Shoulder Right  11/10/2014   CLINICAL DATA:  Sharp right shoulder and humerus pain, intermittent for 1 week.  EXAM: RIGHT SHOULDER - 2+ VIEW  COMPARISON:  None.  FINDINGS: No fracture or dislocation is identified about the shoulder. No destructive osseous lesion is seen. No significant degenerative changes identified. A few small calcifications are noted in the soft tissues at the lateral aspect of the shoulder and upper arm.  IMPRESSION: No acute osseous abnormality identified.   Electronically Signed   By: Sebastian Ache   On: 11/10/2014 13:38   Dg Esophagus  11/09/2014   CLINICAL DATA:  Dysphagia.  Hypertension.  Coronary artery disease.  EXAM: ESOPHOGRAM/BARIUM SWALLOW  TECHNIQUE: Single contrast examination was performed using  thin barium.  FLUOROSCOPY TIME:  3 min and 1 second  COMPARISON:  Chest radiograph of 11/08/2014  FINDINGS: Single-contrast focused exam was  performed in an RAO position, secondary to patient clinical status.  Evaluation of primary peristalsis demonstrates mild dysmotility with incomplete primary peristaltic wave, tertiary contractions in the lower thoracic esophagus, and mild contrast reflux into the upper thoracic esophagus. Series 1.  Full column of the esophagus demonstrates no areas of persistent narrowing or stricture superiorly. Small hiatal hernia. There is a small distal esophageal diverticulum on image 69 of series 1. Despite multiple attempts, an area of incomplete distention is identified at the gastroesophageal junction. Example image 24 of series 9.  13 mm barium tablet has delayed passage in the mid thoracic esophagus.  IMPRESSION: 1. mild esophageal dysmotility, likely presbyesophagus. 2. Small distal esophageal diverticulum. 3. Small hiatal hernia. An area of incomplete distension at the gastroesophageal junction for which Mild peptic induced stricture cannot be excluded. This is felt less clinically significant than dysmotility. 4. Delayed passage of 13 mm barium tablet in the mid thoracic esophagus, likely secondary to dysmotility.   Electronically Signed   By: Jeronimo Greaves M.D.   On: 11/09/2014 13:57   Dg Humerus Right  11/10/2014   CLINICAL DATA:  Sharp right shoulder and humerus pain, intermittent for 1 week.  EXAM: RIGHT HUMERUS - 2+ VIEW  COMPARISON:  None.  FINDINGS: The humerus appears intact without evidence of fracture. The elbow is located. No destructive osseous lesion is seen. A few small soft tissue calcifications are noted in the mid and upper arm. An IV is noted in the antecubital fossa.  IMPRESSION: Negative.   Electronically Signed   By: Sebastian Ache   On: 11/10/2014 13:39    ASSESSMENT AND PLAN  This is a 79 y.o. year old female with past medical history of CAD s/p MI and stenting of LAD remotely and then cath 12/2012 showing patent LAD stent with 95% mid left circ stenosis s/p PCI with DES, ostial 50-60% RCA  stenosis and normal LVH, HTN, diastolic CHF and GERD who presented to [facility] on 11/10/14 with chest pain. She has a history of reflux and had a barium swallow done after her cardiac enzymes were all negative and was found to have esophageal dysmotility but a stricture could not be ruled out. EGD found nonobstructive schatzki's ring and hiatal hernia.   1. Recurrent atypical chest pain  - cath 11/10/2014 75% diffuse in-stent restenosis in mid LAD with significant FFR treated with angioplasty/DES (2.7518 mm Resolute integrity stent postdilated to 3.05 mm), medical therapy for 60-70% ostial LCx and 40-50% mid RCA stenosis  - continue ASA and plavix. Stable from cardiac perspective. Further recommendation  by GI.   - some abdominal pain after cath last night, CT of abdomen shows no bleeding, small hematoma in R groin. States has not had BM in 2 days, will add PRN colace. Some hypotension last night, resolved.   2. Chest pain with Dysphagia: abnormal barium swallow  - EGD nonobstructive Schatzki's ring and 4-5 cm hiatal hernia  - further recommendation per GI  3, CAD  - s/p stent to mid LAD in 2006 (2.515 mm Cypher stent)  - s/p DES to 95% LCx stenosis in 2014, patent LAD stent  4. Mild acute on chronic diastolic HF  - mildly fluid overloaded on exam, on 10mg  daily PO lasix, may need 1 dose of 20mg  lasix for improvement. Likely from excess fluid received during procedure.   - D/C nitro gtt, d/c IVF  5. HTN 6. HLD 7. SVT  Signed, Azalee Course PA-C Pager: [phone]  Patient seen, examined. Available data reviewed. Agree with findings, assessment, and plan as outlined by Azalee Course, PA-C. The patient is independently examined and evaluated. I agree with plans as outlined above. We will five one dose of IV lasix this am, then plan to resume oral lasix tomorrow. Cardiac meds reviewed and appropriate with ASA, plavix, statin, and beta-blocker. Other issues with shoulder pain, constipation, etc as per medicine  service. Please call if any other cardiac issues come up. Thank you.  Tonny Bollman, M.D. 11/11/2014 10:33 AM

## 2014-11-11 NOTE — Progress Notes (Signed)
Subjective: Feeling better.  Chest pain has improved after angioplasty with restenting.  Some RLQ pain, but better.  Objective: Vital signs in last 24 hours: Temp:  [97.5 F (36.4 C)-98.6 F (37 C)] 98.6 F (37 C) (03/11 0817) Pulse Rate:  [73-96] 87 (03/11 0817) Resp:  [12-20] 18 (03/11 0817) BP: (84-201)/(45-102) 131/45 mmHg (03/11 0817) SpO2:  [88 %-100 %] 95 % (03/11 0817) Weight:  [99.1 kg (218 lb 7.6 oz)] 99.1 kg (218 lb 7.6 oz) (03/11 0400) Last BM Date: 11/07/14  Intake/Output from previous day: 03/10 0701 - 03/11 0700 In: 1847.3 [I.V.:1847.3] Out: 600 [Urine:600] Intake/Output this shift: Total I/O In: 240 [P.O.:240] Out: -   General appearance: alert and no distress GI: soft, non-tender; bowel sounds normal; no masses,  no organomegaly  Lab Results:  Recent Labs  11/10/14 0601 11/10/14 1825 11/11/14 0348  WBC 5.5 5.8 4.5  HGB 11.0* 11.1* 10.2*  HCT 34.8* 34.7* 32.3*  PLT 236 226 217   BMET  Recent Labs  11/09/14 1006 11/10/14 0601 11/10/14 1825 11/11/14 0348  NA 139 139  --  140  K 3.5 4.0  --  4.2  CL 106 105  --  106  CO2 30 28  --  28  GLUCOSE 95 105*  --  129*  BUN 13 9  --  6  CREATININE 0.83 0.80 0.86 1.02  CALCIUM 8.7 8.9  --  8.6   LFT  Recent Labs  11/10/14 0601  PROT 6.3  ALBUMIN 3.1*  AST 17  ALT 14  ALKPHOS 77  BILITOT 0.3   PT/INR  Recent Labs  11/10/14 1051  LABPROT 14.3  INR 1.10   Hepatitis Panel No results for input(s): HEPBSAG, HCVAB, HEPAIGM, HEPBIGM in the last 72 hours. C-Diff No results for input(s): CDIFFTOX in the last 72 hours. Fecal Lactopherrin No results for input(s): FECLLACTOFRN in the last 72 hours.  Studies/Results: Ct Abdomen Pelvis Wo Contrast  11/10/2014   CLINICAL DATA:  Right lower quadrant abdominal pain post cardiac catheterization today. Initial encounter.  EXAM: CT ABDOMEN AND PELVIS WITHOUT CONTRAST  TECHNIQUE: Multidetector CT imaging of the abdomen and pelvis was performed  following the standard protocol without IV contrast.  COMPARISON:  Abdominal pelvic CT 03/26/2013.  FINDINGS: Lower chest: Mild bibasilar atelectasis. Small bilateral pleural effusions. There is atherosclerosis of the aorta and coronary arteries. A small hiatal hernia is again noted.  Hepatobiliary: Stable appearance without demonstrated focal abnormality. No evidence of gallstones, gallbladder wall thickening or biliary dilatation.  Pancreas: Mild pancreatic atrophy and fatty replacement. No acute abnormality.  Spleen: Normal in size without focal abnormality.  Adrenals/Urinary Tract: Both adrenal glands appear normal.There is contrast in the collecting systems attributed to the pre seeding catheterization. Stable small left renal cyst and mild right renal cortical thinning. No hydronephrosis. No significant bladder findings.  Stomach/Bowel: No evidence of bowel wall thickening, distention or surrounding inflammatory change.There is dense contrast material within the right colon. Diverticular changes of the distal colon are present without surrounding inflammation.  Vascular/Lymphatic: There are no enlarged abdominal or pelvic lymph nodes. Stable aortoiliac atherosclerosis. Right femoral venous line is in place. There is soft tissue stranding within the subcutaneous fat of the right groin consistent with a small groin hematoma. This demonstrates no significant intrapelvic extension. There is no retroperitoneal hematoma.  Reproductive: Status post hysterectomy. No evidence of adnexal mass.  Other: No evidence of abdominal wall mass or hernia. Injection granulomas again noted in both buttocks.  Musculoskeletal: No  acute or significant osseous findings. Lower lumbar spondylosis noted.  IMPRESSION: 1. Small superficial hematoma within the subcutaneous fat of the right groin. No pelvic or retroperitoneal hematoma. 2. No acute abdominal findings. Atherosclerosis, left renal cyst and distal colonic diverticulosis noted.  3. Mild bibasilar atelectasis and small bilateral pleural effusions.   Electronically Signed   By: Carey BullocksWilliam  Veazey M.D.   On: 11/10/2014 20:30   Dg Shoulder Right  11/10/2014   CLINICAL DATA:  Sharp right shoulder and humerus pain, intermittent for 1 week.  EXAM: RIGHT SHOULDER - 2+ VIEW  COMPARISON:  None.  FINDINGS: No fracture or dislocation is identified about the shoulder. No destructive osseous lesion is seen. No significant degenerative changes identified. A few small calcifications are noted in the soft tissues at the lateral aspect of the shoulder and upper arm.  IMPRESSION: No acute osseous abnormality identified.   Electronically Signed   By: Sebastian AcheAllen  Grady   On: 11/10/2014 13:38   Dg Esophagus  11/09/2014   CLINICAL DATA:  Dysphagia.  Hypertension.  Coronary artery disease.  EXAM: ESOPHOGRAM/BARIUM SWALLOW  TECHNIQUE: Single contrast examination was performed using  thin barium.  FLUOROSCOPY TIME:  3 min and 1 second  COMPARISON:  Chest radiograph of 11/08/2014  FINDINGS: Single-contrast focused exam was performed in an RAO position, secondary to patient clinical status.  Evaluation of primary peristalsis demonstrates mild dysmotility with incomplete primary peristaltic wave, tertiary contractions in the lower thoracic esophagus, and mild contrast reflux into the upper thoracic esophagus. Series 1.  Full column of the esophagus demonstrates no areas of persistent narrowing or stricture superiorly. Small hiatal hernia. There is a small distal esophageal diverticulum on image 69 of series 1. Despite multiple attempts, an area of incomplete distention is identified at the gastroesophageal junction. Example image 24 of series 9.  13 mm barium tablet has delayed passage in the mid thoracic esophagus.  IMPRESSION: 1. mild esophageal dysmotility, likely presbyesophagus. 2. Small distal esophageal diverticulum. 3. Small hiatal hernia. An area of incomplete distension at the gastroesophageal junction for which  Mild peptic induced stricture cannot be excluded. This is felt less clinically significant than dysmotility. 4. Delayed passage of 13 mm barium tablet in the mid thoracic esophagus, likely secondary to dysmotility.   Electronically Signed   By: Jeronimo GreavesKyle  Talbot M.D.   On: 11/09/2014 13:57   Dg Humerus Right  11/10/2014   CLINICAL DATA:  Sharp right shoulder and humerus pain, intermittent for 1 week.  EXAM: RIGHT HUMERUS - 2+ VIEW  COMPARISON:  None.  FINDINGS: The humerus appears intact without evidence of fracture. The elbow is located. No destructive osseous lesion is seen. A few small soft tissue calcifications are noted in the mid and upper arm. An IV is noted in the antecubital fossa.  IMPRESSION: Negative.   Electronically Signed   By: Sebastian AcheAllen  Grady   On: 11/10/2014 13:39    Medications:  Scheduled: . adenosine  16 mL Intravenous To Cath  . ALPRAZolam  0.25 mg Oral BID  . aspirin EC  81 mg Oral Daily  . atorvastatin  40 mg Oral q1800  . clopidogrel  75 mg Oral Daily  . clopidogrel  75 mg Oral Q breakfast  . furosemide  10 mg Oral Daily  . heparin  5,000 Units Subcutaneous 3 times per day  . metoprolol tartrate  12.5 mg Oral BID  . pantoprazole (PROTONIX) IV  40 mg Intravenous Q12H  . potassium chloride SA  20 mEq Oral Daily  .  pregabalin  75 mg Oral BID  . sodium chloride  3 mL Intravenous Q12H   Continuous:   Assessment/Plan: 1) CAD/Chest pain. 2) RLQ pain. 3) Hiatal hernia/Schatzki's ring   Her atypical chest pain has improved/resolved with angioplasty and restenting of one vessel.  As for her RLQ pain, I am uncertain about the source.  It does not appear to be serious at this time and she feels that it is improving.  This is a possible consequence from the catherization, but the CT scan only reveals a small right groin hematoma.  I think PRN pain control will be adequate.  Plan: 1) Medical management per Cardiology. 2) PRN pain control for RLQ pain. 3) Signing off.   LOS: 1 day    Erique Kaser D 11/11/2014, 8:24 AM

## 2014-11-11 NOTE — Progress Notes (Signed)
TR BAND REMOVAL  LOCATION:    right radial  DEFLATED PER PROTOCOL:    Yes.    TIME BAND OFF / DRESSING APPLIED:   21:45  SITE UPON ARRIVAL:    Level 0  SITE AFTER BAND REMOVAL:    Level 0  REVERSE ALLEN'S TEST:     positive  CIRCULATION SENSATION AND MOVEMENT:    Within Normal Limits   yes  COMMENTS:   Pt tolerated removal of TR band without complications.  Will continue to monitor patient.

## 2014-11-11 NOTE — Progress Notes (Signed)
CARDIAC REHAB PHASE I   PRE:  Rate/Rhythm: 90 SR    BP: sitting 131/45    SaO2: 100 RA  MODE:  Ambulation: 200 ft   POST:  Rate/Rhythm: 90 SR    BP: sitting 167/68     SaO2: 95 RA  Pt sts abdominal pain lessened this am. Slightly increased pain with standing. Used RW for stability. Very slow pace, apparently she has been dealing with numerous problems over last 8 months, mostly neurologic. No overt problems walking today, no CP or SOB. C/o right tricep pain after walking with lifting arm. Discussed ed with pt and daughter. Pt is interested in [facility] again and will send referral to [place]. [facility] also [facility] and would like to start attending again. [phone]   Elissa LovettReeve, Kadajah Kjos ColbertKristan CES, ACSM 11/11/2014 8:57 AM

## 2014-11-12 LAB — BASIC METABOLIC PANEL
ANION GAP: 8 (ref 5–15)
BUN: 9 mg/dL (ref 6–23)
CALCIUM: 9 mg/dL (ref 8.4–10.5)
CO2: 29 mmol/L (ref 19–32)
CREATININE: 0.89 mg/dL (ref 0.50–1.10)
Chloride: 103 mmol/L (ref 96–112)
GFR calc Af Amer: 71 mL/min — ABNORMAL LOW (ref >90)
GFR, EST NON AFRICAN AMERICAN: 61 mL/min — AB (ref >90)
Glucose, Bld: 100 mg/dL — ABNORMAL HIGH (ref 70–99)
POTASSIUM: 4.1 mmol/L (ref 3.5–5.1)
SODIUM: 140 mmol/L (ref 135–145)

## 2014-11-12 MED ORDER — ATORVASTATIN CALCIUM 40 MG PO TABS: 40.0000 mg | ORAL_TABLET | Freq: Every day | ORAL | Status: DC

## 2014-11-12 MED ORDER — METOPROLOL TARTRATE 25 MG PO TABS: 12.5000 mg | ORAL_TABLET | Freq: Two times a day (BID) | ORAL | Status: DC

## 2014-11-12 MED ORDER — UNABLE TO FIND: Status: DC

## 2014-11-12 MED ORDER — HYDROCODONE-ACETAMINOPHEN 5-325 MG PO TABS: 0.5000 | ORAL_TABLET | Freq: Four times a day (QID) | ORAL | Status: DC | PRN

## 2014-11-12 MED ORDER — DOCUSATE SODIUM 100 MG PO CAPS
100.0000 mg | ORAL_CAPSULE | Freq: Two times a day (BID) | ORAL | Status: DC | PRN
Start: 2014-11-12 — End: 2016-01-16

## 2014-11-12 MED ORDER — CLOPIDOGREL BISULFATE 75 MG PO TABS: 75.0000 mg | ORAL_TABLET | Freq: Every day | ORAL | Status: DC

## 2014-11-12 NOTE — Discharge Instructions (Signed)
Coronary Angiogram with Stent °Coronary angiography with stent placement is a procedure to widen or open a narrow blood vessel of the heart (coronary artery). When a coronary artery becomes partially blocked, it decreases blood flow to that area. This may lead to chest pain or a heart attack (myocardial infarction). Arteries may become blocked by cholesterol buildup (plaque) in the lining or wall.  °A stent is a small piece of metal that looks like a mesh or a spring. Stent placement may be done right after a coronary angiography in which a blocked artery is found or as a treatment for a heart attack.  °LET YOUR HEALTH CARE PROVIDER KNOW ABOUT: °· Any allergies you have.   °· All medicines you are taking, including vitamins, herbs, eye drops, creams, and over-the-counter medicines.   °· Previous problems you or members of your family have had with the use of anesthetics.   °· Any blood disorders you have.   °· Previous surgeries you have had.   °· Medical conditions you have. °RISKS AND COMPLICATIONS °Generally, coronary angiography with stent is a safe procedure. However, problems can occur and include: °· Damage to the heart or its blood vessels.   °· A return of blockage.   °· Bleeding, infection, or bruising at the insertion site.   °· A collection of blood under the skin (hematoma) at the insertion site. °· Blood clot in another part of the body.   °· Kidney injury.   °· Allergic reaction to the dye or contrast used.   °· Bleeding into the abdomen (retroperitoneal bleeding). °BEFORE THE PROCEDURE °· Do not eat or drink anything after midnight on the night before the procedure or as directed by your health care provider.  °· Ask your health care provider about changing or stopping your regular medicines. This is especially important if you are taking diabetes medicines or blood thinners. °· Your health care provider will make sure you understand the procedure as well as the risks and potential problems  associated with the procedure.   °PROCEDURE °· You may be given a medicine to help you relax before and during the procedure (sedative). This medicine will be given through an IV tube that is put into one of your veins.   °· The area where the catheter will be inserted will be shaved and cleaned. This is usually done in the groin but may be done in the fold of your arm (near your elbow) or in the wrist.    °· A medicine will be given to numb the area where the catheter will be inserted (local anesthetic).   °· The catheter will be inserted into an artery using a guide wire. A type of X-ray (fluoroscopy) will be used to help guide the catheter to the opening of the blocked artery.   °· A dye will then be injected into the catheter, and X-rays will be taken. The dye will help to show where any narrowing or blockages are located in the heart arteries.   °· A tiny wire will be guided to the blocked spot, and a balloon will be inflated to make the artery wider. The stent will be expanded and will crush the plaque into the wall of the vessel. The stent will hold the area open like a scaffolding and improve the blood flow.   °· Sometimes the artery may be made wider using a laser or other tools to remove plaque.   °· When the blood flow is better, the catheter will be removed. The lining of the artery will grow over the stent, which stays where it was placed.   °  AFTER THE PROCEDURE °· If the procedure is done through the leg, you will be kept in bed lying flat for about 6 hours. You will be instructed to not bend or cross your legs.   °· The insertion site will be checked frequently.   °· The pulse in your feet or wrist will be checked frequently.   °· Additional blood tests, X-rays, and electrocardiography may be done. °Document Released: 02/23/2003 Document Revised: 01/03/2014 Document Reviewed: 02/25/2013 °[employer]® Patient Information ©2015 [employer]. This information is not intended to replace advice given to you  by your health care provider. Make sure you discuss any questions you have with your health care provider. ° °

## 2014-11-12 NOTE — Progress Notes (Signed)
CARDIAC REHAB PHASE I   PRE:  Rate/Rhythm: 90 SR  BP:  Supine:   Sitting: 145/92  Standing:    SaO2:   MODE:  Ambulation: 200 ft   POST:  Rate/Rhythm: 103 ST  BP:  Supine:   Sitting: 125/54  Standing:    SaO2:  [id number] Pt walked 200 ft on RA with rolling walker and minimal asst. Pt c/o right leg weak this morning so used gait belt also. Righr leg weakness did not improve per pt with walk. She has been having right shoulder pain. To recliner after walk. PT to see today and can evaluate home needs.   Catherine Nuttingharlene Tye Juarez, RN BSN  11/12/2014 8:23 AM

## 2014-11-12 NOTE — Evaluation (Signed)
Physical Therapy Evaluation Patient Details Name: [name] MRN: [id number] DOB: 08/14/1936 Today's Date: 11/12/2014   History of Present Illness  This is a 79 y.o. year old female with significant past medical history of CAD s/p MI and stenting, HTN, diastolic CHF, GERD, dysrhythmia presenting with chest pain. Pt reports central chest pain w/ radiation to L jaw over last 1-2 days.  Clinical Impression  Patient presents with problems listed below.  Patient with pain in Rt shoulder with decreased strength and ROM, and pain in Rt LE.  Recommend f/u OP PT for continued therapy at discharge.  Patient to d/c home today with family support.    Follow Up Recommendations Outpatient PT;Supervision/Assistance - 24 hour    Equipment Recommendations  None recommended by PT    Recommendations for Other Services       Precautions / Restrictions Precautions Precautions: None Restrictions Weight Bearing Restrictions: No      Mobility  Bed Mobility Overal bed mobility: Modified Independent                Transfers Overall transfer level: Modified independent Equipment used: Rolling walker (2 wheeled)                Ambulation/Gait Ambulation/Gait assistance: Min assist Ambulation Distance (Feet): 20 Feet Assistive device: 1 person hand held assist Gait Pattern/deviations: Decreased stance time - right;Decreased step length - left;Decreased weight shift to right;Antalgic Gait velocity: Decreased Gait velocity interpretation: Below normal speed for age/gender General Gait Details: Attempted ambulation with no assistive device.  Patient with antalgic gait on RLE.  Recommended patient use her RW at home for safety.  Stairs            Wheelchair Mobility    Modified Rankin (Stroke Patients Only)       Balance                                             Pertinent Vitals/Pain Pain Assessment: 0-10 Pain Score: 8  Pain Location: Rt  shoulder and Rt LE Pain Descriptors / Indicators: Aching;Sharp Pain Intervention(s): Limited activity within patient's tolerance;Repositioned;RN gave pain meds during session    [place] expects to be discharged to:: Private residence Living Arrangements: Spouse/significant other Available Help at Discharge: Family;Available 24 hours/day (Husband present 24 hours - uses RW.  Children prn.) Type of Home: House Home Access: Stairs to enter Entrance Stairs-Rails: Right Entrance Stairs-Number of Steps: 8 Home Layout: One level Home Equipment: Walker - 2 wheels;Walker - 4 wheels;Shower seat;Grab bars - tub/shower (Tall toilets) Additional Comments: Husband has dementia and uses RW    Prior Function Level of Independence: Independent with assistive device(s)               Hand Dominance   Dominant Hand: Right    Extremity/Trunk Assessment   Upper Extremity Assessment: RUE deficits/detail RUE Deficits / Details: Patient with decreased ROM and strength at shoulder.  Limited flexion to 90*.  Pain with internal rotation and external rotation.         Lower Extremity Assessment: RLE deficits/detail RLE Deficits / Details: Patient with good ROM.  Noted decreased strength related to pain.  Strength grossly 4-/5.    Cervical / Trunk Assessment: Normal  Communication   Communication: No difficulties  Cognition Arousal/Alertness: Awake/alert Behavior During Therapy: WFL for tasks assessed/performed Overall Cognitive Status: Within Functional  Limits for tasks assessed                      General Comments      Exercises        Assessment/Plan    PT Assessment All further PT needs can be met in the next venue of care  PT Diagnosis Difficulty walking;Abnormality of gait;Acute pain   PT Problem List Decreased strength;Decreased range of motion;Decreased activity tolerance;Decreased balance;Decreased mobility;Decreased knowledge of use of  DME;Cardiopulmonary status limiting activity;Pain  PT Treatment Interventions     PT Goals (Current goals can be found in the Care Plan section) Acute Rehab PT Goals Patient Stated Goal: To decrease pain    Frequency     Barriers to discharge        Co-evaluation               End of Session   Activity Tolerance: Patient limited by pain Patient left: in bed;with call bell/phone within reach;with family/visitor present Nurse Communication: Mobility status (Need for OP PT for Rt shoulder and hip pain)         Time: [id number] PT Time Calculation (min) (ACUTE ONLY): 33 min   Charges:   PT Evaluation $Initial PT Evaluation Tier I: 1 Procedure PT Treatments $Therapeutic Activity: 8-22 mins   PT G CodesDespina Pole 2014/11/20, 9:32 AM Carita Pian. Sanjuana Kava, [place] Pager [phone]

## 2014-11-12 NOTE — Discharge Summary (Signed)
[facility]  Physician Discharge Summary   Patient ID: Catherine HurterCarolyn [name] Damman MRN: [id number] DOB/AGE: 1936/03/14 79 y.o.  Admit date: 11/08/2014 Discharge date: 11/12/2014  PCP: Alva GarnetSHELTON,KIMBERLY R., MD  DISCHARGE DIAGNOSES:  Principal Problem:   Pain in the chest Active Problems:   HTN (hypertension), malignant   CAD (coronary artery disease)   Dysphagia   Essential hypertension   Schatzki's ring   Hiatal hernia   Right shoulder pain   RECOMMENDATIONS FOR OUTPATIENT FOLLOW UP: 1. Continued Outpatient follow-up with PCP for right shoulder pain. 2. Outpatient PT and OT arranged.   DISCHARGE CONDITION: fair  Diet recommendation: Heart healthy  Filed Weights   11/11/14 0400 11/12/14 0015 11/12/14 0500  Weight: 99.1 kg (218 lb 7.6 oz) 100 kg (220 lb 7.4 oz) 100 kg (220 lb 7.4 oz)    INITIAL HISTORY: 79 year old African-American female with a significant history of coronary artery disease presented with chest pain. Some of the symptoms were suggestive of coronary etiology. Other symptoms for more suggestive of GI. Patient underwent EGD, which showed a hiatal hernia and nonobstructive Schatzki's ring. Cardiology was consulted and underwent cardiac catheterization on 3/10 with placement of drug-eluting stent in the LAD.  Consultants:  Cardiology  gastroenterology  Procedures: EGD cardiac catheterization 3/10  HOSPITAL COURSE:   1- Chest Pain  Initially, it wasn't clear whether her symptom was GI in origin or cardiac. She underwent EGD which did not show any findings that could cause her symptoms. Subsequently underwent a cardiac catheterization and a drug-eluting stent was placed in the LAD. Aspirin. Plavix has been continued. She was also maintained on heparin and IV nitroglycerin. Chest pain is resolved after this intervention. She'll be followed by cardiology as an outpatient.   2- essential HTN Continue home medication regimen.   3- Diastolic CHF She  has grade 1 diastolic dysfunction w/ EF 65-70% on 2D ECHO 05/2014. She was given IV Lasix. The on Friday. His euvolemic this morning.  4-Dysphagia;  EGD as discussed earlier. Esophagogram showed mild esophageal dysmotility. Delayed passage of barium tablet in mid thoracic esophagus thought to be due to dysmotility. No further GI workup to be pursued at this time. Patient developed abdominal pain and underwent a CT scan which was unremarkable for any acute process. No further issues at this time. Monitor as an outpatient by PCP.  5-right shoulder pain Present for the last couple weeks. Denies any falls or injuries. Imaging studies were done, which are unremarkable. Outpatient PT and OT will be arranged. She should follow-up with her outpatient provider for further management of the same.   Patient remains stable today. Cleared by cardiology. Okay for discharge.  PERTINENT LABS:  The results of significant diagnostics from this hospitalization (including imaging, microbiology, ancillary and laboratory) are listed below for reference.     Labs: Basic Metabolic Panel:  Recent Labs Lab 11/08/14 1958  11/09/14 1006 11/10/14 0601 11/10/14 1825 11/11/14 0348 11/12/14 0519  NA 141  --  139 139  --  140 140  K 3.6  --  3.5 4.0  --  4.2 4.1  CL 99  --  106 105  --  106 103  CO2  --   --  30 28  --  28 29  GLUCOSE 153*  --  95 105*  --  129* 100*  BUN 14  --  13 9  --  6 9  CREATININE 0.90  < > 0.83 0.80 0.86 1.02 0.89  CALCIUM  --   --  8.7 8.9  --  8.6 9.0  < > = values in this interval not displayed. Liver Function Tests:  Recent Labs Lab 11/09/14 1006 11/10/14 0601  AST 20 17  ALT 15 14  ALKPHOS 76 77  BILITOT 0.5 0.3  PROT 6.2 6.3  ALBUMIN 3.2* 3.1*   CBC:  Recent Labs Lab 11/08/14 1947  11/08/14 2316 11/09/14 1006 11/10/14 0601 11/10/14 1825 11/11/14 0348  WBC 6.7  --  6.5 5.4 5.5 5.8 4.5  NEUTROABS 3.5  --   --  2.4 2.3  --   --   HGB 12.0  < > 11.3* 11.2* 11.0*  11.1* 10.2*  HCT 36.9  < > 35.5* 34.9* 34.8* 34.7* 32.3*  MCV 81.8  --  81.1 82.9 81.3 81.8 83.0  PLT 282  --  248 228 236 226 217  < > = values in this interval not displayed.  Cardiac Enzymes:  Recent Labs Lab 11/08/14 2316 11/09/14 1006 11/09/14 1834 11/10/14 0044 11/10/14 0601  TROPONINI <0.03 <0.03 <0.03 <0.03 <0.03   BNP: BNP (last 3 results)  Recent Labs  09/25/14 1350  BNP 46.8    ProBNP (last 3 results)  Recent Labs  05/02/14 1921  PROBNP 51.5    IMAGING STUDIES Ct Abdomen Pelvis Wo Contrast  11/10/2014   CLINICAL DATA:  Right lower quadrant abdominal pain post cardiac catheterization today. Initial encounter.  EXAM: CT ABDOMEN AND PELVIS WITHOUT CONTRAST  TECHNIQUE: Multidetector CT imaging of the abdomen and pelvis was performed following the standard protocol without IV contrast.  COMPARISON:  Abdominal pelvic CT 03/26/2013.  FINDINGS: Lower chest: Mild bibasilar atelectasis. Small bilateral pleural effusions. There is atherosclerosis of the aorta and coronary arteries. A small hiatal hernia is again noted.  Hepatobiliary: Stable appearance without demonstrated focal abnormality. No evidence of gallstones, gallbladder wall thickening or biliary dilatation.  Pancreas: Mild pancreatic atrophy and fatty replacement. No acute abnormality.  Spleen: Normal in size without focal abnormality.  Adrenals/Urinary Tract: Both adrenal glands appear normal.There is contrast in the collecting systems attributed to the pre seeding catheterization. Stable small left renal cyst and mild right renal cortical thinning. No hydronephrosis. No significant bladder findings.  Stomach/Bowel: No evidence of bowel wall thickening, distention or surrounding inflammatory change.There is dense contrast material within the right colon. Diverticular changes of the distal colon are present without surrounding inflammation.  Vascular/Lymphatic: There are no enlarged abdominal or pelvic lymph nodes.  Stable aortoiliac atherosclerosis. Right femoral venous line is in place. There is soft tissue stranding within the subcutaneous fat of the right groin consistent with a small groin hematoma. This demonstrates no significant intrapelvic extension. There is no retroperitoneal hematoma.  Reproductive: Status post hysterectomy. No evidence of adnexal mass.  Other: No evidence of abdominal wall mass or hernia. Injection granulomas again noted in both buttocks.  Musculoskeletal: No acute or significant osseous findings. Lower lumbar spondylosis noted.  IMPRESSION: 1. Small superficial hematoma within the subcutaneous fat of the right groin. No pelvic or retroperitoneal hematoma. 2. No acute abdominal findings. Atherosclerosis, left renal cyst and distal colonic diverticulosis noted. 3. Mild bibasilar atelectasis and small bilateral pleural effusions.   Electronically Signed   By: Carey Bullocks M.D.   On: 11/10/2014 20:30   Dg Chest 2 View  11/08/2014   CLINICAL DATA:  Chest pain. Shortness of breath. Symptoms for 1 day. Initial encounter.  EXAM: CHEST  2 VIEW  COMPARISON:  09/25/2014.  FINDINGS: Low volume frontal view. Aortic arch atherosclerosis. Cardiopericardial silhouette  within normal limits. Monitoring leads project over the chest. No focal consolidation. No pleural effusion. Retrocardiac and LEFT basilar density a is little changed compared to prior, probably representing scarring. Short segment coronary artery stent incidentally noted.  IMPRESSION: Low volume chest.  Basilar atelectasis and/ or scarring.   Electronically Signed   By: Andreas Newport M.D.   On: 11/08/2014 20:34   Dg Shoulder Right  11/10/2014   CLINICAL DATA:  Sharp right shoulder and humerus pain, intermittent for 1 week.  EXAM: RIGHT SHOULDER - 2+ VIEW  COMPARISON:  None.  FINDINGS: No fracture or dislocation is identified about the shoulder. No destructive osseous lesion is seen. No significant degenerative changes identified. A few  small calcifications are noted in the soft tissues at the lateral aspect of the shoulder and upper arm.  IMPRESSION: No acute osseous abnormality identified.   Electronically Signed   By: Sebastian Ache   On: 11/10/2014 13:38   Dg Esophagus  11/09/2014   CLINICAL DATA:  Dysphagia.  Hypertension.  Coronary artery disease.  EXAM: ESOPHOGRAM/BARIUM SWALLOW  TECHNIQUE: Single contrast examination was performed using  thin barium.  FLUOROSCOPY TIME:  3 min and 1 second  COMPARISON:  Chest radiograph of 11/08/2014  FINDINGS: Single-contrast focused exam was performed in an RAO position, secondary to patient clinical status.  Evaluation of primary peristalsis demonstrates mild dysmotility with incomplete primary peristaltic wave, tertiary contractions in the lower thoracic esophagus, and mild contrast reflux into the upper thoracic esophagus. Series 1.  Full column of the esophagus demonstrates no areas of persistent narrowing or stricture superiorly. Small hiatal hernia. There is a small distal esophageal diverticulum on image 69 of series 1. Despite multiple attempts, an area of incomplete distention is identified at the gastroesophageal junction. Example image 24 of series 9.  13 mm barium tablet has delayed passage in the mid thoracic esophagus.  IMPRESSION: 1. mild esophageal dysmotility, likely presbyesophagus. 2. Small distal esophageal diverticulum. 3. Small hiatal hernia. An area of incomplete distension at the gastroesophageal junction for which Mild peptic induced stricture cannot be excluded. This is felt less clinically significant than dysmotility. 4. Delayed passage of 13 mm barium tablet in the mid thoracic esophagus, likely secondary to dysmotility.   Electronically Signed   By: Jeronimo Greaves M.D.   On: 11/09/2014 13:57   Dg Humerus Right  11/10/2014   CLINICAL DATA:  Sharp right shoulder and humerus pain, intermittent for 1 week.  EXAM: RIGHT HUMERUS - 2+ VIEW  COMPARISON:  None.  FINDINGS: The humerus  appears intact without evidence of fracture. The elbow is located. No destructive osseous lesion is seen. A few small soft tissue calcifications are noted in the mid and upper arm. An IV is noted in the antecubital fossa.  IMPRESSION: Negative.   Electronically Signed   By: Sebastian Ache   On: 11/10/2014 13:39    DISCHARGE EXAMINATION: Filed Vitals:   11/12/14 0500 11/12/14 0531 11/12/14 0535 11/12/14 0745  BP:  132/75 132/75 148/63  Pulse:  91  89  Temp:  98.3 F (36.8 C)  98.2 F (36.8 C)  TempSrc:  Oral  Oral  Resp:  22 15 18   Height:      Weight: 100 kg (220 lb 7.4 oz)     SpO2:  94%  95%   General appearance: alert, cooperative, appears stated age and no distress Resp: clear to auscultation bilaterally Cardio: regular rate and rhythm, S1, S2 normal, no murmur, click, rub or gallop GI:  soft, non-tender; bowel sounds normal; no masses,  no organomegaly  DISPOSITION: Home  Discharge Instructions    Amb Referral to Cardiac Rehabilitation    Complete by:  As directed      Call MD for:  difficulty breathing, headache or visual disturbances    Complete by:  As directed      Call MD for:  extreme fatigue    Complete by:  As directed      Call MD for:  persistant dizziness or light-headedness    Complete by:  As directed      Call MD for:  persistant nausea and vomiting    Complete by:  As directed      Call MD for:  severe uncontrolled pain    Complete by:  As directed      Diet - low sodium heart healthy    Complete by:  As directed      Discharge instructions    Complete by:  As directed   Please see your PCP next week for right shoulder pain.     Increase activity slowly    Complete by:  As directed            ALLERGIES:  Allergies  Allergen Reactions  . Codeine Itching    Tolerates low dose of norco  . Elavil [Amitriptyline] Other (See Comments)    Dissociation  . Adhesive [Tape] Itching and Rash     Discharge Medication List as of 11/12/2014  9:54 AM      START taking these medications   Details  atorvastatin (LIPITOR) 40 MG tablet Take 1 tablet (40 mg total) by mouth daily at 6 PM., Starting 11/12/2014, Until Discontinued, Print    docusate sodium (COLACE) 100 MG capsule Take 1 capsule (100 mg total) by mouth 2 (two) times daily as needed for mild constipation., Starting 11/12/2014, Until Discontinued, Print    metoprolol tartrate (LOPRESSOR) 25 MG tablet Take 0.5 tablets (12.5 mg total) by mouth 2 (two) times daily., Starting 11/12/2014, Until Discontinued, Print      CONTINUE these medications which have CHANGED   Details  clopidogrel (PLAVIX) 75 MG tablet Take 1 tablet (75 mg total) by mouth daily., Starting 11/12/2014, Until Discontinued, Print    HYDROcodone-acetaminophen (NORCO/VICODIN) 5-325 MG per tablet Take 0.5-1 tablets by mouth every 6 (six) hours as needed for moderate pain., Starting 11/12/2014, Until Discontinued, Print    UNABLE TO FIND Outpatient PT and OT for right shoulder pain., Print      CONTINUE these medications which have NOT CHANGED   Details  acetaminophen (TYLENOL) 500 MG tablet Take 500 mg by mouth daily as needed (pain)., Until Discontinued, Historical Med    albuterol (PROVENTIL HFA;VENTOLIN HFA) 108 (90 BASE) MCG/ACT inhaler Inhale 2 puffs into the lungs every 6 (six) hours as needed for wheezing., Starting 05/23/2014, Until Discontinued, Normal    ALPRAZolam (XANAX) 0.25 MG tablet Take 0.25 mg by mouth 2 (two) times daily. anxiety, Until Discontinued, Historical Med    aspirin EC 81 MG tablet Take 81 mg by mouth daily.  , Until Discontinued, Historical Med    conjugated estrogens (PREMARIN) vaginal cream Place 1 Applicatorful vaginally daily as needed (dryness). As needed., Starting 02/07/2014, Until Discontinued, Historical Med    Diphenhydramine-APAP, sleep, (TYLENOL PM EXTRA STRENGTH PO) Take 1 tablet by mouth at bedtime as needed., Until Discontinued, Historical Med    fluticasone (FLONASE) 50 MCG/ACT  nasal spray Place 2 sprays into both nostrils daily., Starting 05/06/2014, Until  Discontinued, Print    furosemide (LASIX) 20 MG tablet Take 10 mg by mouth daily. , Until Discontinued, Historical Med    lidocaine (LIDODERM) 5 % Place 1 patch onto the skin daily as needed (pain). , Starting 09/27/2014, Until Discontinued, Historical Med    loratadine (CLARITIN) 10 MG tablet Take 10 mg by mouth daily. , Until Discontinued, Historical Med    LYRICA 75 MG capsule Take 75 mg by mouth 2 (two) times daily., Starting 10/10/2014, Until Discontinued, Historical Med    Nepafenac (ILEVRO) 0.3 % SUSP Apply 1 drop to eye See admin instructions. Instill 1 drop in left eye daily for 3 more days (until 05/05/14), instill 1 drop in right eye daily for 10 days starting 05/01/14, Until Discontinued, Historical Med    nitroGLYCERIN (NITROSTAT) 0.4 MG SL tablet Place 1 tablet (0.4 mg total) under the tongue every 5 (five) minutes as needed for chest pain (CP or SOB)., Starting 01/28/2013, Until Discontinued, Normal    pantoprazole (PROTONIX) 40 MG tablet Take 1 tablet (40 mg total) by mouth daily at 12 noon., Starting 05/06/2014, Until Discontinued, Print    permethrin (ACTICIN) 5 % cream Apply 1 application topically once. Apply to face and then rest 8-14 hours before washing off, Starting 08/24/2014, Normal    potassium chloride SA (K-DUR,KLOR-CON) 20 MEQ tablet Take 20 mEq by mouth daily., Until Discontinued, Historical Med    valsartan-hydrochlorothiazide (DIOVAN-HCT) 160-12.5 MG per tablet Take 1 tablet by mouth daily. , Starting 07/05/2013, Until Discontinued, Historical Med    zolpidem (AMBIEN) 10 MG tablet Take 5 mg by mouth daily as needed for sleep. , Until Discontinued, Historical Med      STOP taking these medications     azithromycin (ZITHROMAX Z-PAK) 250 MG tablet        Follow-up Information    Follow up with Theodore Demark, PA-C On 12/05/2014.   Specialty:  Cardiology   Why:  11:00am   Contact  information:   [address] [place] [address] [phone]       Follow up with [name]., MD. Schedule an appointment as soon as possible for a visit in 1 week.   Specialty:  Internal Medicine   Why:  post hospitalization follow up and to recheck right shoulder   Contact information:   [address] [place] [address] [phone]       TOTAL DISCHARGE TIME: 35 minutes  [facility]  [facility] Pager [phone]  11/12/2014, 3:39 PM

## 2014-11-17 ENCOUNTER — Ambulatory Visit: Payer: BC Managed Care – PPO | Attending: Internal Medicine | Admitting: Physical Therapy

## 2014-11-17 DIAGNOSIS — M25611 Stiffness of right shoulder, not elsewhere classified: Secondary | ICD-10-CM | POA: Diagnosis not present

## 2014-11-17 DIAGNOSIS — M25511 Pain in right shoulder: Secondary | ICD-10-CM | POA: Diagnosis present

## 2014-11-17 DIAGNOSIS — R29898 Other symptoms and signs involving the musculoskeletal system: Secondary | ICD-10-CM | POA: Insufficient documentation

## 2014-11-17 NOTE — Patient Instructions (Signed)
Posture Tips DO: - stand tall and erect - keep chin tucked in - keep head and shoulders in alignment - check posture regularly in mirror or large window - pull head back against headrest in car seat;  Change your position often.  Sit with lumbar support. DON'T: - slouch or slump while watching TV or reading - sit, stand or lie in one position  for too long;  Sitting is especially hard on the spine so if you sit at a desk/use the computer, then stand up often!   Copyright  VHI. All rights reserved.  Posture - Standing   Good posture is important. Avoid slouching and forward head thrust. Maintain curve in low back and align ears over shoul- ders, hips over ankles.  Pull your belly button in toward your back bone.   Copyright  VHI. All rights reserved.  Posture - Sitting   Sit upright, head facing forward. Try using a roll to support lower back. Keep shoulders relaxed, and avoid rounded back. Keep hips level with knees. Avoid crossing legs for long periods.   Copyright  VHI. All rights reserved.     Scapular Retraction (Standing)   With arms at sides, pinch shoulder blades together. Repeat __10__ times per set. Do __2__ sets per session. Do __2__ sessions per day.  http://orth.exer.us/945   Copyright  VHI. All rights reserved.  Cane Majorette - Supine   Holding cane majorette style down across trunk, raise cane diagonally over and beyond shoulder. Repeat _5-10__ times. Reverse hand placement, repeat to other side. Do __2_ times per day.  Copyright  VHI. All rights reserved.  Cane Horizontal - Supine   With straight arms holding cane above shoulders, bring cane out to right, center, out to left, and back to above head. Repeat __5-10_ times. Do _2__ times per day.  Copyright  VHI. All rights reserved.   Cane Overhead - Supine  Hold cane at thighs with both hands, extend arms straight over head. Hold _10__ seconds. Repeat __10_ times. Do _2-3__ times per day.

## 2014-11-18 NOTE — Therapy (Signed)
[facility] [address] [place], [place], [address] Phone: [phone]   Fax:  [phone]  Physical Therapy Evaluation  Patient Details  Name: [name] MRN: [id number] Date of Birth: 1936-04-27 Referring Provider:  Osvaldo ShipperKrishnan, Gokul, MD  Encounter Date: 11/17/2014      PT End of Session - 11/17/14 2036    Visit Number 1   Number of Visits 8   Date for PT Re-Evaluation 12/29/14   PT Start Time 1020   PT Stop Time 1110   PT Time Calculation (min) 50 min   Activity Tolerance Patient tolerated treatment well;Patient limited by pain   Behavior During Therapy [facility] for tasks assessed/performed      Past Medical History  Diagnosis Date  . Coronary artery disease   . Hypertension   . Anxiety   . Fibromyalgia   . Dysrhythmia     history of "irregular heart rate"'; pt. thinks it was A-fib  . Vertigo 08/27/2006    [name]/notes 08/27/2006 (01/27/2013)  . Ventricular hypertrophy     [name]/notes 08/27/2006 (01/27/2013)  . High cholesterol   . Anginal pain   . Exertional shortness of breath     "sometimes" (01/27/2013)  . History of stomach ulcers     "years ago" (528/2014)  . GERD (gastroesophageal reflux disease)   . Arthritis     "q where" (01/27/2013)  . Osteoporosis   . Acute myocardial infarction, unspecified site, initial episode of care   . Acute myocardial infarction of other lateral wall, initial episode of care     Past Surgical History  Procedure Laterality Date  . Hernia repair    . Laparotomy      "day after oophorectomy; had bowel obstruction" (01/27/2013)  . Coronary angioplasty with stent placement  06/2005; 01/27/2013    stent to LAD/notes 08/27/2006; "+ 1 stent today" (01/27/2013)  . Cardiac catheterization  10/2006    [name]/notes 08/03/2007 (01/27/2013)  . Tonsillectomy and adenoidectomy  ~ 1952  . Abdominal hysterectomy  1970's  . Laparoscopic salpingo oopherectomy  2000  . Diagnostic laparoscopy  2000    lap  abdominal lysis of adhesions  . Breast lumpectomy Right 1970's    benign   . Breast biopsy Right 1970's  . Left heart catheterization with coronary angiogram N/A 01/27/2013    Procedure: LEFT HEART CATHETERIZATION WITH CORONARY ANGIOGRAM;  Surgeon: Corky CraftsJayadeep S Varanasi, MD;  [facility];  Service: Cardiovascular;  Laterality: N/A;  . Left heart catheterization with coronary angiogram N/A 11/10/2014    Procedure: LEFT HEART CATHETERIZATION WITH CORONARY ANGIOGRAM;  Surgeon: Lennette Biharihomas A Kelly, MD;  [facility];  Service: Cardiovascular;  Laterality: N/A;  . Esophagogastroduodenoscopy N/A 11/10/2014    Procedure: ESOPHAGOGASTRODUODENOSCOPY (EGD);  Surgeon: Jeani HawkingPatrick Hung, MD;  [facility];  Service: Endoscopy;  Laterality: N/A;    There were no vitals filed for this visit.  Visit Diagnosis:  Pain in joint, shoulder region, right - Plan: PT plan of care cert/re-cert  Stiffness of joint, shoulder region, right - Plan: PT plan of care cert/re-cert  Weakness of right arm - Plan: PT plan of care cert/re-cert      Subjective Assessment - 11/17/14 1026    Symptoms Patient with Rt, Shoulder pain which has been going on for approx. 6 mos and has worsened with recent hospital admission last weekend.  She has diff with lifting, reaching, carrying purse, occ N/T, weakness.    Pertinent History pneumonia, recent stent placement  Limitations Lifting;House hold activities;Sitting;Standing;Walking   How long can you sit comfortably? NA for shoulder   How long can you stand comfortably? NA for shoulder, limited due to back, hip and general fatigue   How long can you walk comfortably? See above   Diagnostic tests EMG, XR for Rt UE neg   Patient Stated Goals Relief of pain   Currently in Pain? Yes   Pain Score 5    Pain Location Shoulder   Pain Orientation Right   Pain Descriptors / Indicators Aching   Pain Type Chronic pain   Pain Onset More than a month ago   Pain Frequency  Intermittent   Aggravating Factors  using LUE   Pain Relieving Factors rest, ice, heat, meds   Multiple Pain Sites No            [facility] PT Assessment - 11/18/14 0918    AROM   Right Shoulder Flexion 115 Degrees   Right Shoulder ABduction 140 Degrees   Right Shoulder Internal Rotation --  painful reaches to low back   Right Shoulder External Rotation --  diff touching back of head, min pain   Left Shoulder Flexion 150 Degrees   Left Shoulder ABduction 148 Degrees   PROM   Overall PROM  --  ER/IR WNL passively at 90 deg at  abd   Strength   Right/Left Shoulder --  pain in Rt. with all MMT   Right Shoulder Flexion 3-/5   Right Shoulder ABduction 3/5   Right Shoulder Internal Rotation 3+/5   Right Shoulder External Rotation 3+/5      Pt performed supine cane ex all planes and ER/IR unattached with yellow band for HEP      PT Education - 11/17/14 2036    Education provided Yes   Education Details PT/POC, HEP, impingement/anatomy, ICE, posture   Person(s) Educated Patient   Methods Explanation;Demonstration;Handout   Comprehension Verbalized understanding;Returned demonstration;Need further instruction             PT Long Term Goals - 11/17/14 2050    PT LONG TERM GOAL #1   Title Pt. will demo/verbalize techniques to reduce risk of re-injury to include posture, lifting, body mechanics related to RT. UE   Time 6   PT LONG TERM GOAL #2   Title Pt will be I with advanced HEP   Time 6   Period Weeks   Status New   PT LONG TERM GOAL #3   Title Pt will complete grooming, ADLs with min occasional pain in Rt. UE   Time 6   Period Weeks   Status New   PT LONG TERM GOAL #4   Title Pt will be able to reach overhead for light item without increase in pain   Time 6   Period Weeks   Status New   PT LONG TERM GOAL #5   Title FOTO               Plan - 11/17/14 2037    Clinical Impression Statement This patient presents with symptoms consistent with rotator  cuff impingement, made worse by recent decrease in activity and cardiac procedure.  She will begin cardiac rehab very soon, so she may opt to focus on that as she does an HEP.  She will benefit from skilled PT to improve her ability to use arm for mobility, assist with care of husband and improve comfort with functional activities.  hip not assessed due to time constraints and also hip  pain was not on her paper referral, only in Epic.    Pt will benefit from skilled therapeutic intervention in order to improve on the following deficits Decreased endurance;Decreased mobility;Difficulty walking;Impaired sensation;Pain;Improper body mechanics;Decreased range of motion;Cardiopulmonary status limiting activity;Decreased activity tolerance;Decreased strength;Increased fascial restricitons;Impaired flexibility;Impaired UE functional use;Postural dysfunction   Rehab Potential Good   PT Frequency 2x / week  then taper to 1 time if she does cardiac rehab   PT Duration 6 weeks   PT Treatment/Interventions ADLs/Self Care Home Management;Electrical Stimulation;Therapeutic exercise;Patient/family education;Wheelchair mobility training;Moist Heat;Functional mobility training;Neuromuscular re-education;Manual techniques;Passive range of motion;Therapeutic activities;Ultrasound;Cryotherapy;Other (comment)  Caution with IFC due to cardiac? Iontophoresis   PT Next Visit Plan check HEP and add soft tissue to supraspin. progress as tolerated.  Monitor cardiac.    PT Home Exercise Plan ER/IR and supine cane, posture   Recommended Other Services cardiac rehab   Consulted and Agree with Plan of Care Patient          G-Codes - 12-17-14 0913    Functional Assessment Tool Used FOTO   Functional Limitation Carrying, moving and handling objects   Carrying, Moving and Handling Objects Current Status [id number]) At least 40 percent but less than 60 percent impaired, limited or restricted   Carrying, Moving and Handling Objects  Goal Status (O9629) At least 20 percent but less than 40 percent impaired, limited or restricted       Problem List Patient Active Problem List   Diagnosis Date Noted  . Schatzki's ring 11/11/2014  . Hiatal hernia 11/11/2014  . Right shoulder pain   . Dysphagia   . Essential hypertension   . Pain in the chest   . Dyspnea 05/05/2014  . Cough 05/05/2014  . Pulmonary infiltrates 05/04/2014  . CAP (community acquired pneumonia) 05/02/2014  . Atypical face pain 03/24/2014  . Facial numbness 03/24/2014  . Dysuria 01/26/2014  . Atrophic vaginitis 01/26/2014  . Other and unspecified hyperlipidemia 07/23/2013  . Old MI (myocardial infarction)     Class: Chronic  . Abdominal pain 11/13/2011  . Nausea and vomiting 11/13/2011  . Vertigo, benign positional 07/07/2011  . HTN (hypertension), malignant 07/07/2011  . Dizziness 07/07/2011  . CAD (coronary artery disease) 07/07/2011  . A-fib 07/07/2011  . Abdominal pain, lower 07/07/2011    Elizabethann Lackey 2014/12/17, 9:40 AM  [facility] [address] [place], [place], [address] Phone: [phone]   Fax:  [phone]

## 2014-11-29 ENCOUNTER — Ambulatory Visit: Payer: BC Managed Care – PPO | Admitting: Physical Therapy

## 2014-12-07 ENCOUNTER — Ambulatory Visit: Payer: BC Managed Care – PPO | Attending: Internal Medicine | Admitting: Physical Therapy

## 2014-12-07 DIAGNOSIS — R29898 Other symptoms and signs involving the musculoskeletal system: Secondary | ICD-10-CM | POA: Diagnosis not present

## 2014-12-07 DIAGNOSIS — M25511 Pain in right shoulder: Secondary | ICD-10-CM | POA: Insufficient documentation

## 2014-12-07 DIAGNOSIS — M25611 Stiffness of right shoulder, not elsewhere classified: Secondary | ICD-10-CM | POA: Insufficient documentation

## 2014-12-07 NOTE — Therapy (Signed)
[facility] [address] [place], [place], [address] Phone: [phone]   Fax:  [phone]  Physical Therapy Treatment  Patient Details  Name: [name] MRN: [id number] Date of Birth: October 04, 1935 Referring Provider:  Andi DevonShelton, Kimberly, MD  Encounter Date: 12/07/2014      PT End of Session - 12/07/14 1319    Visit Number 2   Number of Visits 8   Date for PT Re-Evaluation 12/29/14   PT Start Time 1024   PT Stop Time 1107   PT Time Calculation (min) 43 min   Activity Tolerance Patient limited by pain      Past Medical History  Diagnosis Date  . Coronary artery disease   . Hypertension   . Anxiety   . Fibromyalgia   . Dysrhythmia     history of "irregular heart rate"'; pt. thinks it was A-fib  . Vertigo 08/27/2006    [name]/notes 08/27/2006 (01/27/2013)  . Ventricular hypertrophy     [name]/notes 08/27/2006 (01/27/2013)  . High cholesterol   . Anginal pain   . Exertional shortness of breath     "sometimes" (01/27/2013)  . History of stomach ulcers     "years ago" (528/2014)  . GERD (gastroesophageal reflux disease)   . Arthritis     "q where" (01/27/2013)  . Osteoporosis   . Acute myocardial infarction, unspecified site, initial episode of care   . Acute myocardial infarction of other lateral wall, initial episode of care     Past Surgical History  Procedure Laterality Date  . Hernia repair    . Laparotomy      "day after oophorectomy; had bowel obstruction" (01/27/2013)  . Coronary angioplasty with stent placement  06/2005; 01/27/2013    stent to LAD/notes 08/27/2006; "+ 1 stent today" (01/27/2013)  . Cardiac catheterization  10/2006    [name]/notes 08/03/2007 (01/27/2013)  . Tonsillectomy and adenoidectomy  ~ 1952  . Abdominal hysterectomy  1970's  . Laparoscopic salpingo oopherectomy  2000  . Diagnostic laparoscopy  2000    lap abdominal lysis of adhesions  . Breast lumpectomy Right 1970's    benign   . Breast biopsy  Right 1970's  . Left heart catheterization with coronary angiogram N/A 01/27/2013    Procedure: LEFT HEART CATHETERIZATION WITH CORONARY ANGIOGRAM;  Surgeon: Catherine CraftsJayadeep S Varanasi, MD;  [facility];  Service: Cardiovascular;  Laterality: N/A;  . Left heart catheterization with coronary angiogram N/A 11/10/2014    Procedure: LEFT HEART CATHETERIZATION WITH CORONARY ANGIOGRAM;  Surgeon: Catherine Biharihomas A Kelly, MD;  [facility];  Service: Cardiovascular;  Laterality: N/A;  . Esophagogastroduodenoscopy N/A 11/10/2014    Procedure: ESOPHAGOGASTRODUODENOSCOPY (EGD);  Surgeon: Catherine HawkingPatrick Hung, MD;  [facility];  Service: Endoscopy;  Laterality: N/A;    There were no vitals filed for this visit.  Visit Diagnosis:  Weakness of right arm  Pain in joint, shoulder region, right  Stiffness of joint, shoulder region, right      Subjective Assessment - 12/07/14 1030    Subjective The say I have to do one or the other (Cardiac rehab) but I may try once a week here. Still has pain in Rt limited in reaching across.     Currently in Pain? Yes   Pain Score 5    Pain Location Shoulder   Pain Orientation Right   Pain Type Chronic pain            [facility] PT Assessment - 12/07/14 1037  AROM   Right Shoulder Flexion 65 Degrees  130 in supine AAROM   Right Shoulder ABduction 60 Degrees   Right Shoulder Internal Rotation --  lumbar   Right Shoulder External Rotation --  back of head          [facility] Adult PT Treatment/Exercise - 12/07/14 1039    Shoulder Exercises: Supine   Flexion AAROM;Both;10 reps   ABduction AAROM;Left;5 reps   Shoulder Exercises: ROM/Strengthening   Other ROM/Strengthening Exercises pulleys flexion and scaption 3 min each    Shoulder Exercises: Isometric Strengthening   Flexion 5X10"   Extension 5X10"   External Rotation 5X10"   Internal Rotation 5X10"   ABduction 5X10"          PT Education - 12/07/14 1316    Education provided Yes   Education  Details isometrics   Person(s) Educated Patient   Methods Explanation;Demonstration;Handout   Comprehension Verbalized understanding;Returned demonstration             PT Long Term Goals - 12/07/14 1329    PT LONG TERM GOAL #1   Title Pt. will demo/verbalize techniques to reduce risk of re-injury to include posture, lifting, body mechanics related to RT. UE   Status On-going   PT LONG TERM GOAL #2   Title Pt will be I with advanced HEP   Status On-going   PT LONG TERM GOAL #3   Title Pt will complete grooming, ADLs with min occasional pain in Rt. UE   Status On-going   PT LONG TERM GOAL #4   Title Pt will be able to reach overhead for light item without increase in pain   Status On-going   PT LONG TERM GOAL #5   Title FOTO will decrease to 38% or less limited in functional use of Rt. UE.   Baseline 59%   Status On-going               Plan - 12/07/14 1321    Clinical Impression Statement [name] had mod to severe difficulty with AAROM exercises today, was given isometrics for HEP.  Patient will begin cardiac rehab and check with insurance to see if she can continue 1 time per week here as she really needs pain relief and functional use of Rt. UE.  Will defer hip for now as she will get LE exercise at cardiac rehab.    Pt will benefit from skilled therapeutic intervention in order to improve on the following deficits Decreased endurance;Decreased mobility;Difficulty walking;Impaired sensation;Pain;Improper body mechanics;Decreased range of motion;Cardiopulmonary status limiting activity;Decreased activity tolerance;Decreased strength;Increased fascial restricitons;Impaired flexibility;Impaired UE functional use;Postural dysfunction   PT Frequency 1x / week  new frequency requested per pt.    PT Treatment/Interventions ADLs/Self Care Home Management;Electrical Stimulation;Therapeutic exercise;Patient/family education;Moist Heat;Functional mobility training;Neuromuscular  re-education;Manual techniques;Passive range of motion;Therapeutic activities;Ultrasound;Cryotherapy;Other (comment);Dry needling   PT Next Visit Plan cont with [place], soft tissue work and gentle ROM.    PT Home Exercise Plan ER/IR and supine cane, posture, isometrics.    Consulted and Agree with Plan of Care Patient        Problem List Patient Active Problem List   Diagnosis Date Noted  . Schatzki's ring 11/11/2014  . Hiatal hernia 11/11/2014  . Right shoulder pain   . Dysphagia   . Essential hypertension   . Pain in the chest   . Dyspnea 05/05/2014  . Cough 05/05/2014  . Pulmonary infiltrates 05/04/2014  . CAP (community acquired pneumonia) 05/02/2014  . Atypical face pain 03/24/2014  .  Facial numbness 03/24/2014  . Dysuria 01/26/2014  . Atrophic vaginitis 01/26/2014  . Other and unspecified hyperlipidemia 07/23/2013  . Old MI (myocardial infarction)     Class: Chronic  . Abdominal pain 11/13/2011  . Nausea and vomiting 11/13/2011  . Vertigo, benign positional 07/07/2011  . HTN (hypertension), malignant 07/07/2011  . Dizziness 07/07/2011  . CAD (coronary artery disease) 07/07/2011  . A-fib 07/07/2011  . Abdominal pain, lower 07/07/2011    Harli Engelken 12/07/2014, 1:32 PM  [facility] [address] [place], [place], [address] Phone: [phone]   Fax:  [phone]

## 2014-12-07 NOTE — Patient Instructions (Signed)
Strengthening: Isometric Flexion  Using wall for resistance, press right fist into ball using light pressure. Hold ____ seconds. Repeat ____ times per set. Do ____ sets per session. Do ____ sessions per day.  SHOULDER: Abduction (Isometric)  Use wall as resistance. Press arm against pillow. Keep elbow straight. Hold ___ seconds. ___ reps per set, ___ sets per day, ___ days per week  Extension (Isometric)  Place left bent elbow and back of arm against wall. Press elbow against wall. Hold ____ seconds. Repeat ____ times. Do ____ sessions per day.  Internal Rotation (Isometric)  Place palm of right fist against door frame, with elbow bent. Press fist against door frame. Hold ____ seconds. Repeat ____ times. Do ____ sessions per day.  External Rotation (Isometric)  Place back of left fist against door frame, with elbow bent. Press fist against door frame. Hold ____ seconds. Repeat ____ times. Do ____ sessions per day.  Copyright  VHI. All rights reserved.    

## 2014-12-09 ENCOUNTER — Encounter: Payer: Self-pay | Admitting: Physician Assistant

## 2014-12-09 ENCOUNTER — Ambulatory Visit (INDEPENDENT_AMBULATORY_CARE_PROVIDER_SITE_OTHER): Payer: Medicare Other | Admitting: Physician Assistant

## 2014-12-09 VITALS — BP 132/82 | HR 86 | Ht 67.0 in | Wt 210.0 lb

## 2014-12-09 DIAGNOSIS — I25110 Atherosclerotic heart disease of native coronary artery with unstable angina pectoris: Secondary | ICD-10-CM | POA: Diagnosis not present

## 2014-12-09 DIAGNOSIS — E785 Hyperlipidemia, unspecified: Secondary | ICD-10-CM | POA: Diagnosis not present

## 2014-12-09 NOTE — Progress Notes (Signed)
Cardiology Office Note   Date:  12/09/2014   ID:  [name], DOB 1936/07/28, MRN [id number]  PCP:  [name]., MD  Cardiologist:  Dr. Alda Lea Zykerria Tanton, PA-C   Post-Hospital Follow-up after stenting for unstable anginal pain.  History of Present Illness: [name] is a 79 y.o. female with a history of MI and stenting of LAD remotely and then cath 12/2012 showing patent LAD stent with 95% mid left circ stenosis s/p PCI with DES, ostial 50-60% RCA stenosis and normal LVH, HTN, diastolic CHF and GERD. She was admitted in March 2016 with unstable anginal pain and had a DES to the LAD with moderate disease in the circumflex and RCA to be treated medically. EF 60-65 percent by echo with PAS 32.  She is here today in follow-up. She has been compliant with her medications and is attending cardiac rehabilitation. She is trying to eat in a more healthy manner. She has had problems tolerating statins in the past and is currently taking Lipitor twice a week. She does not remember when her cholesterol was checked last.  She feels her symptoms are miraculously improved since the stent. Additionally, she recently had cortisone injections to both of her shoulders and decreased musculoskeletal pain due to this. With the decreased musculoskeletal pain, she is increasing her household activities and is very happy with this. In general, she is doing very well.   Past Medical History  Diagnosis Date  . Coronary artery disease   . Hypertension   . Anxiety   . Fibromyalgia   . Dysrhythmia     Palpitations, possible atrial fibrillation  . Vertigo 08/27/2006  . Ventricular hypertrophy   . High cholesterol   . Anginal pain   . Exertional shortness of breath   . History of stomach ulcers     Remotely  . GERD (gastroesophageal reflux disease)   . Arthritis     Multiple joints  . Osteoporosis   . Acute myocardial infarction, unspecified site, initial episode of care    . Acute myocardial infarction of other lateral wall, initial episode of care     Past Surgical History  Procedure Laterality Date  . Hernia repair    . Laparotomy      "day after oophorectomy; had bowel obstruction" (01/27/2013)  . Coronary angioplasty with stent placement  06/2005; 01/27/2013     2.515 mm Cypher stent to LAD in 2006; 2.75 x 12 Promus DES to the circumflex 2014    . Cardiac catheterization  10/2006    [name] 08/03/2007 (01/27/2013)  . Tonsillectomy and adenoidectomy  ~ 1952  . Abdominal hysterectomy  1970's  . Laparoscopic salpingo oopherectomy  2000  . Diagnostic laparoscopy  2000    lap abdominal lysis of adhesions  . Breast lumpectomy Right 1970's    benign   . Breast biopsy Right 1970's  . Left heart catheterization with coronary angiogram N/A 01/27/2013    Procedure: LEFT HEART CATHETERIZATION WITH CORONARY ANGIOGRAM;  Surgeon: Corky Crafts, MD;  Location: [facility] CATH LAB;  Service: Cardiovascular;  Laterality: N/A;  . Left heart catheterization with coronary angiogram N/A 11/10/2014    Procedure: LEFT HEART CATHETERIZATION WITH CORONARY ANGIOGRAM;  Surgeon: Lennette Bihari, MD; left main okay, LAD 75% ISR, FFR 0.75, s/p Angiosculpt scoring balloon and 2.7518 mm Resolute DES, CFX 70%, stent patent, RCA 50%  . Esophagogastroduodenoscopy N/A 11/10/2014    Procedure: ESOPHAGOGASTRODUODENOSCOPY (EGD);  Surgeon: Jeani Hawking, MD;  Location:  [facility]OS[facility];  Service: Endoscopy;  Laterality: N/A;    Current Outpatient Prescriptions  Medication Sig Dispense Refill  . acetaminophen (TYLENOL) 500 MG tablet Take 500 mg by mouth daily as needed (pain).    [name]. albuterol (PROVENTIL HFA;VENTOLIN HFA) 108 (90 BASE) MCG/ACT inhaler Inhale 2 puffs into the lungs every 6 (six) hours as needed for wheezing. 1 Inhaler 3  . ALPRAZolam (XANAX) 0.25 MG tablet Take 0.25 mg by mouth 2 (two) times daily. anxiety    . aspirin EC 81 MG tablet Take 81 mg by mouth daily.      [name]. atorvastatin  (LIPITOR) 40 MG tablet Take 1 tablet (40 mg total) by mouth daily at 6 PM. 30 tablet 2  . clopidogrel (PLAVIX) 75 MG tablet Take 1 tablet (75 mg total) by mouth daily. 30 tablet 2  . conjugated estrogens (PREMARIN) vaginal cream Place 1 Applicatorful vaginally daily as needed (dryness). As needed.    . Diphenhydramine-APAP, sleep, (TYLENOL PM EXTRA STRENGTH PO) Take 1 tablet by mouth at bedtime as needed.    . docusate sodium (COLACE) 100 MG capsule Take 1 capsule (100 mg total) by mouth 2 (two) times daily as needed for mild constipation. 60 capsule 0  . fluticasone (FLONASE) 50 MCG/ACT nasal spray Place 2 sprays into both nostrils daily. 1 g 2  . furosemide (LASIX) 20 MG tablet Take 10 mg by mouth daily.     [name]. HYDROcodone-acetaminophen (NORCO/VICODIN) 5-325 MG per tablet Take 0.5-1 tablets by mouth every 6 (six) hours as needed for moderate pain. 20 tablet 0  . lidocaine (LIDODERM) 5 % Place 1 patch onto the skin daily as needed (pain).     [name]. loratadine (CLARITIN) 10 MG tablet Take 10 mg by mouth daily.     [name]. LYRICA 75 MG capsule Take 75 mg by mouth 2 (two) times daily.    . metoprolol tartrate (LOPRESSOR) 25 MG tablet Take 0.5 tablets (12.5 mg total) by mouth 2 (two) times daily. 60 tablet 2  . Nepafenac (ILEVRO) 0.3 % SUSP Apply 1 drop to eye See admin instructions. Instill 1 drop in left eye daily for 3 more days (until 05/05/14), instill 1 drop in right eye daily for 10 days starting 05/01/14    . nitroGLYCERIN (NITROSTAT) 0.4 MG SL tablet Place 1 tablet (0.4 mg total) under the tongue every 5 (five) minutes as needed for chest pain (CP or SOB). 25 tablet 12  . pantoprazole (PROTONIX) 40 MG tablet Take 1 tablet (40 mg total) by mouth daily at 12 noon. 21 tablet 0  . permethrin (ACTICIN) 5 % cream Apply 1 application topically once. Apply to face and then rest 8-14 hours before washing off 60 g 0  . potassium chloride SA (K-DUR,KLOR-CON) 20 MEQ tablet Take 20 mEq by mouth daily.    [name]. UNABLE TO  FIND Outpatient PT and OT for right shoulder pain. 1 each 0  . valsartan-hydrochlorothiazide (DIOVAN-HCT) 160-12.5 MG per tablet Take 1 tablet by mouth daily.     [name]. zolpidem (AMBIEN) 10 MG tablet Take 5 mg by mouth daily as needed for sleep.     . [DISCONTINUED] ezetimibe (ZETIA) 10 MG tablet Take 10 mg by mouth daily.      . [DISCONTINUED] metoprolol (TOPROL-XL) 50 MG 24 hr tablet Take 50 mg by mouth daily.      . [DISCONTINUED] potassium chloride (KLOR-CON) 20 MEQ packet Take 20 mEq by mouth daily.       No current facility-administered medications  for this visit.    Allergies:   Codeine; Elavil; and Adhesive    Social History:  The patient  reports that she quit smoking about 23 years ago. Her smoking use included Cigarettes. She has a 4.32 pack-year smoking history. She has never used smokeless tobacco. She reports that she does not drink alcohol or use illicit drugs.   Family History:  The patient's family history includes Other in her father and mother.    ROS:  Please see the history of present illness. All other systems are reviewed and negative.   PHYSICAL EXAM: VS:  BP 132/82 mmHg  Pulse 86  Ht 5\' 7"  (1.702 m)  Wt 210 lb (95.255 kg)  BMI 32.88 kg/m2 , BMI Body mass index is 32.88 kg/(m^2). GEN: Well nourished, well developed, in no acute distress HEENT: normal Neck: no JVD, carotid bruits, or masses Cardiac: RRR; soft murmur, rubs, or gallops,no edema  Respiratory:  clear to auscultation bilaterally, normal work of breathing GI: soft, nontender, nondistended, + BS MS: no deformity or atrophy Skin: warm and dry, no rash Neuro:  Strength and sensation are intact Psych: euthymic mood, full affect   EKG:  EKG is ordered today. The ekg ordered today demonstrates sinus rhythm, no acute ischemic changes.  Recent Labs: 05/02/2014: Pro B Natriuretic peptide (BNP) 51.5 09/25/2014: B Natriuretic Peptide 46.8 11/10/2014: ALT 14 11/11/2014: Hemoglobin 10.2*; Platelets  217 11/12/2014: BUN 9; Creatinine 0.89; Potassium 4.1; Sodium 140    Lipid Panel    Component Value Date/Time   CHOL * 04/18/2009 0500    271        ATP III CLASSIFICATION:  <200     mg/dL   Desirable  161-096  mg/dL   Borderline High  >=045    mg/dL   High          TRIG 409 04/18/2009 0500   HDL 42 04/18/2009 0500   CHOLHDL 6.5 04/18/2009 0500   VLDL 23 04/18/2009 0500   LDLCALC  206* 04/18/2009 0500     Wt Readings from Last 3 Encounters:  12/09/14 210 lb (95.255 kg)  11/12/14 220 lb 7.4 oz (100 kg)  08/24/14 209 lb 9.6 oz (95.074 kg)     Other studies Reviewed: Additional studies/ records that were reviewed today include: Previous cath reports, ECG. Review of the above records demonstrates: Moderate 3 vessel CAD, significant hyperlipidemia  ASSESSMENT AND PLAN:  1.  CAD: She was recently hospitalized with unstable anginal pain and had a drug-eluting stent to the LAD for in-stent restenosis. We discussed aggressive cardiac risk factor reduction. She is encouraged to make heart-healthy dietary changes. She is encouraged to try increasing her Lipitor to 3 times a week. She is congratulated on compliance with her medications and participation in cardiac rehabilitation.  2. Hyperlipidemia: Last lipid profile we have in the system shows an LDL of 206. She has problems with statins. She is encouraged to increase her Lipitor from twice a week to 3 times a week and was advised she can go back to twice a week if that's all she can tolerate. She cannot recall recent fasting lipid profile so we will order that. Hepatic functions were checked during her last admission and were normal except for a mildly low albumin level.  Current medicines are reviewed at length with the patient today.  The patient does not have concerns regarding medicines.  The following changes have been made:  Increase Lipitor to 3 times a week  Labs/ tests ordered  today include:  Orders Placed This Encounter   Procedures  . Lipid Profile    Disposition:   FU with Dr. Katrinka Blazing in 3 months   Signed, Theodore Demark, PA-C  12/09/2014 5:32 PM    [facility] [address] [place], [place], [place]  [address] Phone: [phone]; Fax: [phone]

## 2014-12-09 NOTE — Patient Instructions (Addendum)
Continue current medications as prescribed, do not miss the Plavix or your baby aspirin.  Try to take the Lipitor 3 times a week instead of 2.  Follow-up with Dr. Katrinka BlazingSmith in 3 months. OR NEXT AVAILABLE APPT   FASTING LABS LIPIDS BEFORE FOLLOW UP WITH DR Katrinka BlazingSMITH   We will schedule a fasting lipid profile, please come in for this and we will forward the results to your family physician.  Continue your plan to eat healthier and increase your activity.

## 2014-12-15 ENCOUNTER — Encounter (HOSPITAL_COMMUNITY)
Admission: RE | Admit: 2014-12-15 | Discharge: 2014-12-15 | Disposition: A | Discharge status: Home / Self Care | Payer: BC Managed Care – PPO | Source: Ambulatory Visit | Attending: Interventional Cardiology | Admitting: Interventional Cardiology

## 2014-12-15 DIAGNOSIS — I252 Old myocardial infarction: Secondary | ICD-10-CM | POA: Insufficient documentation

## 2014-12-15 DIAGNOSIS — I2510 Atherosclerotic heart disease of native coronary artery without angina pectoris: Secondary | ICD-10-CM | POA: Insufficient documentation

## 2014-12-15 DIAGNOSIS — Z48812 Encounter for surgical aftercare following surgery on the circulatory system: Secondary | ICD-10-CM | POA: Insufficient documentation

## 2014-12-15 DIAGNOSIS — Z955 Presence of coronary angioplasty implant and graft: Secondary | ICD-10-CM | POA: Insufficient documentation

## 2014-12-15 NOTE — Progress Notes (Signed)
Cardiac Rehab Medication Review by a Pharmacist  Does the patient  feel that his/her medications are working for him/her?  yes  Has the patient been experiencing any side effects to the medications prescribed?  no  Does the patient measure his/her own blood pressure at home?  Yes. BP runs 120-130s / 75-85  Does the patient have any problems obtaining medications due to transportation or finances?   no  Understanding of regimen: good Understanding of indications: good Potential of compliance: good    Pharmacist comments:  Pt has good understanding of medications and has no issues today. Pt reports compliance with all of his medications. Did ask about financial help for Lyrica (has tried gabapentin in the past) but I am unaware if we would have anything to help with the cost of that medication.    Armandina StammerBATCHELDER,Zaiyden Strozier J 12/15/2014 9:18 AM

## 2014-12-19 ENCOUNTER — Encounter (HOSPITAL_COMMUNITY): Payer: BC Managed Care – PPO

## 2014-12-20 ENCOUNTER — Ambulatory Visit: Payer: BC Managed Care – PPO | Admitting: Physical Therapy

## 2014-12-20 DIAGNOSIS — M25511 Pain in right shoulder: Secondary | ICD-10-CM

## 2014-12-20 DIAGNOSIS — R29898 Other symptoms and signs involving the musculoskeletal system: Secondary | ICD-10-CM

## 2014-12-20 DIAGNOSIS — M25611 Stiffness of right shoulder, not elsewhere classified: Secondary | ICD-10-CM

## 2014-12-20 NOTE — Therapy (Signed)
[facility] [address] [place], [place], [address] Phone: [phone]   Fax:  [phone]  Physical Therapy Treatment  Patient Details  Name: [name] MRN: [id number] Date of Birth: 02-28-1936 Referring Provider:  Andi Devon, MD  Encounter Date: 12/20/2014      PT End of Session - 12/20/14 1643    Visit Number 3   Number of Visits 8   Date for PT Re-Evaluation 12/29/14   PT Start Time 1553   PT Stop Time 1634   PT Time Calculation (min) 41 min   Activity Tolerance Patient tolerated treatment well;No increased pain      Past Medical History  Diagnosis Date  . Coronary artery disease   . Hypertension   . Anxiety   . Fibromyalgia   . Dysrhythmia     Palpitations, possible atrial fibrillation  . Vertigo 08/27/2006  . Ventricular hypertrophy   . High cholesterol   . Anginal pain   . Exertional shortness of breath   . History of stomach ulcers     Remotely  . GERD (gastroesophageal reflux disease)   . Arthritis     Multiple joints  . Osteoporosis   . Acute myocardial infarction, unspecified site, initial episode of care   . Acute myocardial infarction of other lateral wall, initial episode of care     Past Surgical History  Procedure Laterality Date  . Hernia repair    . Laparotomy      "day after oophorectomy; had bowel obstruction" (01/27/2013)  . Coronary angioplasty with stent placement  06/2005; 01/27/2013     2.515 mm Cypher stent to LAD in 2006; 2.75 x 12 Promus DES to the circumflex 2014    . Cardiac catheterization  10/2006    [name] 08/03/2007 (01/27/2013)  . Tonsillectomy and adenoidectomy  ~ 1952  . Abdominal hysterectomy  1970's  . Laparoscopic salpingo oopherectomy  2000  . Diagnostic laparoscopy  2000    lap abdominal lysis of adhesions  . Breast lumpectomy Right 1970's    benign   . Breast biopsy Right 1970's  . Left heart catheterization with coronary angiogram N/A  01/27/2013    Procedure: LEFT HEART CATHETERIZATION WITH CORONARY ANGIOGRAM;  Surgeon: Corky Crafts, MD;  Location: [facility] CATH LAB;  Service: Cardiovascular;  Laterality: N/A;  . Left heart catheterization with coronary angiogram N/A 11/10/2014    Procedure: LEFT HEART CATHETERIZATION WITH CORONARY ANGIOGRAM;  Surgeon: Lennette Bihari, MD; left main okay, LAD 75% ISR, FFR 0.75, s/p Angiosculpt scoring balloon and 2.7518 mm Resolute DES, CFX 70%, stent patent, RCA 50%  . Esophagogastroduodenoscopy N/A 11/10/2014    Procedure: ESOPHAGOGASTRODUODENOSCOPY (EGD);  Surgeon: Jeani Hawking, MD;  Location: [facility] ENDOSCOPY;  Service: Endoscopy;  Laterality: N/A;    There were no vitals filed for this visit.  Visit Diagnosis:  Weakness of right arm  Pain in joint, shoulder region, right  Stiffness of joint, shoulder region, right      Subjective Assessment - 12/20/14 1614    Subjective Denies pain, starts cardiac rehab tomorrow.  Had cortisone injections both shoulders which has helped a lot!            [facility] PT Assessment - 12/20/14 1641    Assessment   Medical Diagnosis Rt shoulder   Strength   Right Shoulder Internal Rotation 4+/5   Right Shoulder External Rotation 4/5  [facility] Adult PT Treatment/Exercise - 12/20/14 1617    Shoulder Exercises: Supine   External Rotation Strengthening;20 reps;Theraband   Theraband Level (Shoulder External Rotation) Level 2 (Red)   Shoulder Exercises: Standing   Extension Both;10 reps;Theraband   Theraband Level (Shoulder Extension) Level 3 (Green)   Row Both;12 reps;Theraband   Theraband Level (Shoulder Row) Level 3 (Green)   Shoulder Exercises: Lawyertretch   Corner Stretch 3 reps;30 seconds      Pilates Reformer used for LE/core strength, postural strength, lumbopelvic disassociation and core control.  Exercises included: Supine Arm work 1 Red arcs and circles Supine pull up eccentric 1 Red  Patient needed assist for  keeping legs in tabletop        PT Education - 12/20/14 1642    Education provided Yes   Education Details HEP for shoulder strength   Person(s) Educated Patient   Methods Explanation   Comprehension Verbalized understanding;Returned demonstration             PT Long Term Goals - 12/20/14 1645    PT LONG TERM GOAL #1   Title Pt. will demo/verbalize techniques to reduce risk of re-injury to include posture, lifting, body mechanics related to RT. UE   Status On-going   PT LONG TERM GOAL #2   Title Pt will be I with advanced HEP   Status On-going   PT LONG TERM GOAL #3   Title Pt will complete grooming, ADLs with min occasional pain in Rt. UE   Status On-going   PT LONG TERM GOAL #4   Title Pt will be able to reach overhead for light item without increase in pain   Status On-going   PT LONG TERM GOAL #5   Title FOTO will decrease to 38% or less limited in functional use of Rt. UE.   Status Unable to assess               Plan - 12/20/14 1643    Clinical Impression Statement Pain has resolved a great deal and is now able to tolerate more advanced exercises.  Will cont to ensure improvement and progress as tol.    PT Next Visit Plan cont with US, soft tissue work and gentle ROM. Check HEP   PT Home Exercise Plan subsititute standing ext/row for isometrics   Consulted and Agree with Plan of Care Patient        Problem List Patient Active Problem List   Diagnosis Date Noted  . Schatzki's ring 11/11/2014  . Hiatal hernia 11/11/2014  . Right shoulder pain   . Dysphagia   . Essential hypertension   . Pain in the chest   . Dyspnea 05/05/2014  . Cough 05/05/2014  . Pulmonary infiltrates 05/04/2014  . CAP (community acquired pneumonia) 05/02/2014  . Atypical face pain 03/24/2014  . Facial numbness 03/24/2014  . Dysuria 01/26/2014  . Atrophic vaginitis 01/26/2014  . Other and unspecified hyperlipidemia 07/23/2013  . Old MI (myocardial infarction)     Class:  Chronic  . Abdominal pain 11/13/2011  . Nausea and vomiting 11/13/2011  . Vertigo, benign positional 07/07/2011  . HTN (hypertension), malignant 07/07/2011  . Dizziness 07/07/2011  . CAD (coronary artery disease) 07/07/2011  . A-fib 07/07/2011  . Abdominal pain, lower 07/07/2011    PAA,JENNIFER 12/20/2014, 4:47 PM  [facility] [address] [place], [place], [id number] Phone: [phone]   Fax:  [phone]

## 2014-12-21 ENCOUNTER — Encounter (HOSPITAL_COMMUNITY)
Admission: RE | Admit: 2014-12-21 | Discharge: 2014-12-21 | Disposition: A | Discharge status: Home / Self Care | Payer: BC Managed Care – PPO | Source: Ambulatory Visit | Attending: Interventional Cardiology | Admitting: Interventional Cardiology

## 2014-12-21 ENCOUNTER — Encounter (HOSPITAL_COMMUNITY): Payer: BC Managed Care – PPO

## 2014-12-21 DIAGNOSIS — I2510 Atherosclerotic heart disease of native coronary artery without angina pectoris: Secondary | ICD-10-CM | POA: Diagnosis not present

## 2014-12-21 DIAGNOSIS — I252 Old myocardial infarction: Secondary | ICD-10-CM | POA: Diagnosis not present

## 2014-12-21 DIAGNOSIS — Z955 Presence of coronary angioplasty implant and graft: Secondary | ICD-10-CM | POA: Diagnosis not present

## 2014-12-21 DIAGNOSIS — Z48812 Encounter for surgical aftercare following surgery on the circulatory system: Secondary | ICD-10-CM | POA: Diagnosis not present

## 2014-12-21 NOTE — Progress Notes (Signed)
Pt started cardiac rehab today.  Pt tolerated light exercise without difficulty. Telemetry rhythm Sinus with an occasional PAC. Vital signs stable. PHQ=0. Mrs [name] says that her husband [name] is declining in health and cannot drive anymore. Psychosocial Assessment:  No needs identified at this time. [name]'s short term goals are to get better, increase strength and stamina.  Long term goals are to get back into an exercise routine. The exercising on your own class will be helpful to [name] meeting her goals. Will continue to monitor the patient throughout  the program.

## 2014-12-23 ENCOUNTER — Encounter (HOSPITAL_COMMUNITY): Payer: BC Managed Care – PPO

## 2014-12-23 ENCOUNTER — Encounter (HOSPITAL_COMMUNITY)
Admission: RE | Admit: 2014-12-23 | Discharge: 2014-12-23 | Disposition: A | Discharge status: Home / Self Care | Payer: BC Managed Care – PPO | Source: Ambulatory Visit | Attending: Interventional Cardiology | Admitting: Interventional Cardiology

## 2014-12-23 DIAGNOSIS — Z48812 Encounter for surgical aftercare following surgery on the circulatory system: Secondary | ICD-10-CM | POA: Diagnosis not present

## 2014-12-26 ENCOUNTER — Encounter (HOSPITAL_COMMUNITY)
Admission: RE | Admit: 2014-12-26 | Discharge: 2014-12-26 | Disposition: A | Discharge status: Home / Self Care | Payer: BC Managed Care – PPO | Source: Ambulatory Visit | Attending: Interventional Cardiology | Admitting: Interventional Cardiology

## 2014-12-26 ENCOUNTER — Encounter (HOSPITAL_COMMUNITY): Payer: BC Managed Care – PPO

## 2014-12-26 DIAGNOSIS — Z48812 Encounter for surgical aftercare following surgery on the circulatory system: Secondary | ICD-10-CM | POA: Diagnosis not present

## 2014-12-26 NOTE — Progress Notes (Signed)
Academic intern reviewed home exercise with pt today.  [facility]  Reviewed THR, pulse, RPE, sign and symptoms, NTG use, and when to call 911 or MD.  Pt voiced understanding. Debby FreibergLee Griswold, Academic Intern Fabio PierceJessica Sewell Pitner, MA, ACSM RCEP

## 2014-12-27 ENCOUNTER — Ambulatory Visit: Payer: BC Managed Care – PPO | Admitting: Physical Therapy

## 2014-12-28 ENCOUNTER — Encounter (HOSPITAL_COMMUNITY): Payer: BC Managed Care – PPO

## 2014-12-28 ENCOUNTER — Encounter (HOSPITAL_COMMUNITY)
Admission: RE | Admit: 2014-12-28 | Discharge: 2014-12-28 | Disposition: A | Discharge status: Home / Self Care | Payer: BC Managed Care – PPO | Source: Ambulatory Visit | Attending: Interventional Cardiology | Admitting: Interventional Cardiology

## 2014-12-28 DIAGNOSIS — Z48812 Encounter for surgical aftercare following surgery on the circulatory system: Secondary | ICD-10-CM | POA: Diagnosis not present

## 2014-12-30 ENCOUNTER — Encounter (HOSPITAL_COMMUNITY): Payer: BC Managed Care – PPO

## 2014-12-30 ENCOUNTER — Encounter (HOSPITAL_COMMUNITY)
Admission: RE | Admit: 2014-12-30 | Discharge: 2014-12-30 | Disposition: A | Discharge status: Home / Self Care | Payer: BC Managed Care – PPO | Source: Ambulatory Visit | Attending: Interventional Cardiology | Admitting: Interventional Cardiology

## 2014-12-30 DIAGNOSIS — Z48812 Encounter for surgical aftercare following surgery on the circulatory system: Secondary | ICD-10-CM | POA: Diagnosis not present

## 2015-01-02 ENCOUNTER — Ambulatory Visit (INDEPENDENT_AMBULATORY_CARE_PROVIDER_SITE_OTHER): Payer: Medicare Other | Admitting: Interventional Cardiology

## 2015-01-02 ENCOUNTER — Encounter (HOSPITAL_COMMUNITY)
Admission: RE | Admit: 2015-01-02 | Discharge: 2015-01-02 | Disposition: A | Discharge status: Home / Self Care | Payer: BC Managed Care – PPO | Source: Ambulatory Visit | Attending: Interventional Cardiology | Admitting: Interventional Cardiology

## 2015-01-02 ENCOUNTER — Encounter (HOSPITAL_COMMUNITY): Payer: BC Managed Care – PPO

## 2015-01-02 ENCOUNTER — Encounter: Payer: Self-pay | Admitting: Interventional Cardiology

## 2015-01-02 VITALS — BP 148/94 | HR 70 | Ht 67.0 in | Wt 209.8 lb

## 2015-01-02 DIAGNOSIS — E785 Hyperlipidemia, unspecified: Secondary | ICD-10-CM

## 2015-01-02 DIAGNOSIS — R300 Dysuria: Secondary | ICD-10-CM | POA: Diagnosis not present

## 2015-01-02 DIAGNOSIS — I480 Paroxysmal atrial fibrillation: Secondary | ICD-10-CM

## 2015-01-02 DIAGNOSIS — I2510 Atherosclerotic heart disease of native coronary artery without angina pectoris: Secondary | ICD-10-CM | POA: Diagnosis not present

## 2015-01-02 DIAGNOSIS — I252 Old myocardial infarction: Secondary | ICD-10-CM | POA: Insufficient documentation

## 2015-01-02 DIAGNOSIS — Z955 Presence of coronary angioplasty implant and graft: Secondary | ICD-10-CM | POA: Diagnosis not present

## 2015-01-02 DIAGNOSIS — I10 Essential (primary) hypertension: Secondary | ICD-10-CM

## 2015-01-02 DIAGNOSIS — Z48812 Encounter for surgical aftercare following surgery on the circulatory system: Secondary | ICD-10-CM | POA: Diagnosis not present

## 2015-01-02 NOTE — Patient Instructions (Signed)
Medication Instructions:  STOP Atorvastatin CONTINUE Livalo as directed  Labwork: Your physician recommends that you return for a FASTING lipid profile and alt on 01/23/15   Testing/Procedures: None   Follow-Up: Your physician wants you to follow-up in: 4-6 months with Dr.Smith You will receive a reminder letter in the mail two months in advance. If you don't receive a letter, please call our office to schedule the follow-up appointment.   Any Other Special Instructions Will Be Listed Below (If Applicable). Please call the office to let us know your Livalo dosage

## 2015-01-02 NOTE — Progress Notes (Signed)
Cardiology Office Note   Date:  01/02/2015   ID:  [name], DOB 1935/10/24, MRN [id number]  PCP:  [name]., MD  Cardiologist:   Lesleigh Noe, MD   Chief Complaint  Patient presents with  . Coronary Artery Disease      History of Present Illness: [name] is a 79 y.o. female who presents for coronary artery disease with prior LAD stent (06) and restent 2016, circumflex stent 2014. History of hypertension and hyperlipidemia with statin intolerance.  Since the most recent presentation and treatment for LAD in-stent restenosis, she has done well. She denies angina. No nitroglycerin use. No syncope or other complaints. She is in the cardiac rehabilitation program. She has stopped using atorvastatin and switch to Livalo.    Past Medical History  Diagnosis Date  . Coronary artery disease   . Hypertension   . Anxiety   . Fibromyalgia   . Dysrhythmia     Palpitations, possible atrial fibrillation  . Vertigo 08/27/2006  . Ventricular hypertrophy   . High cholesterol   . Anginal pain   . Exertional shortness of breath   . History of stomach ulcers     Remotely  . GERD (gastroesophageal reflux disease)   . Arthritis     Multiple joints  . Osteoporosis   . Acute myocardial infarction, unspecified site, initial episode of care   . Acute myocardial infarction of other lateral wall, initial episode of care     Past Surgical History  Procedure Laterality Date  . Hernia repair    . Laparotomy      "day after oophorectomy; had bowel obstruction" (01/27/2013)  . Coronary angioplasty with stent placement  06/2005; 01/27/2013     2.515 mm Cypher stent to LAD in 2006; 2.75 x 12 Promus DES to the circumflex 2014    . Cardiac catheterization  10/2006    [name] 08/03/2007 (01/27/2013)  . Tonsillectomy and adenoidectomy  ~ 1952  . Abdominal hysterectomy  1970's  . Laparoscopic salpingo oopherectomy  2000  . Diagnostic laparoscopy  2000    lap  abdominal lysis of adhesions  . Breast lumpectomy Right 1970's    benign   . Breast biopsy Right 1970's  . Left heart catheterization with coronary angiogram N/A 01/27/2013    Procedure: LEFT HEART CATHETERIZATION WITH CORONARY ANGIOGRAM;  Surgeon: Corky Crafts, MD;  Location: [facility] CATH LAB;  Service: Cardiovascular;  Laterality: N/A;  . Left heart catheterization with coronary angiogram N/A 11/10/2014    Procedure: LEFT HEART CATHETERIZATION WITH CORONARY ANGIOGRAM;  Surgeon: Lennette Bihari, MD; left main okay, LAD 75% ISR, FFR 0.75, s/p Angiosculpt scoring balloon and 2.7518 mm Resolute DES, CFX 70%, stent patent, RCA 50%  . Esophagogastroduodenoscopy N/A 11/10/2014    Procedure: ESOPHAGOGASTRODUODENOSCOPY (EGD);  Surgeon: Jeani Hawking, MD;  Location: [facility] ENDOSCOPY;  Service: Endoscopy;  Laterality: N/A;     Current Outpatient Prescriptions  Medication Sig Dispense Refill  . acetaminophen (TYLENOL) 500 MG tablet Take 500 mg by mouth daily as needed (pain).    [name] albuterol (PROVENTIL HFA;VENTOLIN HFA) 108 (90 BASE) MCG/ACT inhaler Inhale 2 puffs into the lungs every 6 (six) hours as needed for wheezing. 1 Inhaler 3  . ALPRAZolam (XANAX) 0.25 MG tablet Take 0.25 mg by mouth 2 (two) times daily. anxiety    . aspirin EC 81 MG tablet Take 81 mg by mouth daily.      [name] atorvastatin (LIPITOR) 40 MG tablet Take 1 tablet (40  mg total) by mouth daily at 6 PM. (Patient taking differently: Take 40 mg by mouth once a week. ) 30 tablet 2  . clopidogrel (PLAVIX) 75 MG tablet Take 1 tablet (75 mg total) by mouth daily. 30 tablet 2  . conjugated estrogens (PREMARIN) vaginal cream Place 1 Applicatorful vaginally daily as needed (dryness). As needed.    . Diphenhydramine-APAP, sleep, (TYLENOL PM EXTRA STRENGTH PO) Take 1 tablet by mouth at bedtime as needed.    . docusate sodium (COLACE) 100 MG capsule Take 1 capsule (100 mg total) by mouth 2 (two) times daily as needed for mild constipation. 60 capsule 0  .  fluticasone (FLONASE) 50 MCG/ACT nasal spray Place 2 sprays into both nostrils daily. 1 g 2  . furosemide (LASIX) 20 MG tablet Take 10 mg by mouth daily.     [name]. HYDROcodone-acetaminophen (NORCO/VICODIN) 5-325 MG per tablet Take 0.5-1 tablets by mouth every 6 (six) hours as needed for moderate pain. 20 tablet 0  . lidocaine (LIDODERM) 5 % Place 1 patch onto the skin daily as needed (pain).     [name]. loratadine (CLARITIN) 10 MG tablet Take 10 mg by mouth daily.     [name]. LYRICA 75 MG capsule Take 75 mg by mouth 2 (two) times daily.    . metoprolol tartrate (LOPRESSOR) 25 MG tablet Take 0.5 tablets (12.5 mg total) by mouth 2 (two) times daily. 60 tablet 2  . nitroGLYCERIN (NITROSTAT) 0.4 MG SL tablet Place 1 tablet (0.4 mg total) under the tongue every 5 (five) minutes as needed for chest pain (CP or SOB). 25 tablet 12  . potassium chloride SA (K-DUR,KLOR-CON) 20 MEQ tablet Take 20 mEq by mouth daily.    [name]. UNABLE TO FIND Outpatient PT and OT for right shoulder pain. 1 each 0  . valsartan-hydrochlorothiazide (DIOVAN-HCT) 160-12.5 MG per tablet Take 1 tablet by mouth daily.     [name]. zolpidem (AMBIEN) 10 MG tablet Take 5 mg by mouth daily as needed for sleep.     . [DISCONTINUED] ezetimibe (ZETIA) 10 MG tablet Take 10 mg by mouth daily.      . [DISCONTINUED] metoprolol (TOPROL-XL) 50 MG 24 hr tablet Take 50 mg by mouth daily.      . [DISCONTINUED] potassium chloride (KLOR-CON) 20 MEQ packet Take 20 mEq by mouth daily.       No current facility-administered medications for this visit.    Allergies:   Adhesive; Codeine; and Elavil    Social History:  The patient  reports that she quit smoking about 23 years ago. Her smoking use included Cigarettes. She has a 4.32 pack-year smoking history. She has never used smokeless tobacco. She reports that she does not drink alcohol or use illicit drugs.   Family History:  The patient's family history includes Other in her father and mother.    ROS:  Please see the  history of present illness.   Otherwise, review of systems are positive for TIA, bleeding, and claudication..'s also has some lower extremity swelling.   All other systems are reviewed and negative.    PHYSICAL EXAM: VS:  BP 148/94 mmHg  Pulse 70  Ht 5\' 7"  (1.702 m)  Wt 209 lb 12.8 oz (95.165 kg)  BMI 32.85 kg/m2  SpO2 97% , BMI Body mass index is 32.85 kg/(m^2). GEN: Well nourished, well developed, in no acute distress HEENT: normal Neck: no JVD, carotid bruits, or masses Cardiac: RRR; no murmurs, rubs, or gallops,no edema  Respiratory:  clear to  auscultation bilaterally, normal work of breathing GI: soft, nontender, nondistended, + BS MS: no deformity or atrophy Skin: warm and dry, no rash Neuro:  Strength and sensation are intact Psych: euthymic mood, full affect   EKG:  EKG is not ordered today   Recent Labs: 05/02/2014: Pro B Natriuretic peptide (BNP) 51.5 09/25/2014: B Natriuretic Peptide 46.8 11/10/2014: ALT 14 11/11/2014: Hemoglobin 10.2*; Platelets 217 11/12/2014: BUN 9; Creatinine 0.89; Potassium 4.1; Sodium 140    Lipid Panel    Component Value Date/Time   CHOL * 04/18/2009 0500    271        ATP III CLASSIFICATION:  <200     mg/dL   Desirable  045-409  mg/dL   Borderline High  >=811    mg/dL   High          TRIG 914 04/18/2009 0500   HDL 42 04/18/2009 0500   CHOLHDL 6.5 04/18/2009 0500   VLDL 23 04/18/2009 0500   LDLCALC * 04/18/2009 0500    206        Total Cholesterol/HDL:CHD Risk Coronary Heart Disease Risk Table                     Men   Women  1/2 Average Risk   3.4   3.3  Average Risk       5.0   4.4  2 X Average Risk   9.6   7.1  3 X Average Risk  23.4   11.0        Use the calculated Patient Ratio above and the CHD Risk Table to determine the patient's CHD Risk.        ATP III CLASSIFICATION (LDL):  <100     mg/dL   Optimal  782-956  mg/dL   Near or Above                    Optimal  130-159  mg/dL   Borderline  213-086  mg/dL   High   >578     mg/dL   Very High      Wt Readings from Last 3 Encounters:  01/02/15 209 lb 12.8 oz (95.165 kg)  12/15/14 206 lb 2.1 oz (93.5 kg)  12/09/14 210 lb (95.255 kg)      Other studies Reviewed: Additional studies/ records that were reviewed today include: .   ASSESSMENT AND PLAN:  Coronary artery disease involving native coronary artery of native heart without angina pectoris  Old MI (myocardial infarction)  Hyperlipidemia - muscle aches and intolerance to statins. She is on atorvastatin once daily. Primary care is switched her to Livalo.  Essential hypertension : Controlled  Paroxysmal atrial fibrillation: She does not believe there is a history of this problem. I cannot find it documented in her records.     Current medicines are reviewed at length with the patient today.  The patient has concerns regarding medicines.  The following changes have been made:  Tolerates Livalo better than atorvastatin. We'll give her an additional 3 weeks on this medication and repeat a lipid panel.  Labs/ tests ordered today include:   No orders of the defined types were placed in this encounter.     Disposition:   FU with HS in 6 months  Signed, Lesleigh Noe, MD  01/02/2015 12:10 PM    [facility] [address] [place], [place], [place]  [address] Phone: [phone]; Fax: [phone]

## 2015-01-03 ENCOUNTER — Ambulatory Visit: Payer: BC Managed Care – PPO | Admitting: Physical Therapy

## 2015-01-03 ENCOUNTER — Telehealth: Payer: Self-pay | Admitting: Interventional Cardiology

## 2015-01-03 MED ORDER — PITAVASTATIN CALCIUM 2 MG PO TABS: 1.0000 | ORAL_TABLET | Freq: Every day | ORAL | Status: DC

## 2015-01-03 NOTE — Telephone Encounter (Signed)
Noted. med list updated.

## 2015-01-03 NOTE — Telephone Encounter (Signed)
New problem   Pt stated her Livalo is 2mg .

## 2015-01-04 ENCOUNTER — Encounter (HOSPITAL_COMMUNITY): Payer: BC Managed Care – PPO

## 2015-01-04 ENCOUNTER — Encounter (HOSPITAL_COMMUNITY)
Admission: RE | Admit: 2015-01-04 | Discharge: 2015-01-04 | Disposition: A | Discharge status: Home / Self Care | Payer: BC Managed Care – PPO | Source: Ambulatory Visit | Attending: Interventional Cardiology | Admitting: Interventional Cardiology

## 2015-01-04 DIAGNOSIS — Z48812 Encounter for surgical aftercare following surgery on the circulatory system: Secondary | ICD-10-CM | POA: Diagnosis not present

## 2015-01-06 ENCOUNTER — Encounter (HOSPITAL_COMMUNITY): Payer: BC Managed Care – PPO

## 2015-01-06 ENCOUNTER — Encounter: Payer: Self-pay | Admitting: Physical Therapy

## 2015-01-06 ENCOUNTER — Encounter (HOSPITAL_COMMUNITY)
Admission: RE | Admit: 2015-01-06 | Discharge: 2015-01-06 | Disposition: A | Discharge status: Home / Self Care | Payer: BC Managed Care – PPO | Source: Ambulatory Visit | Attending: Interventional Cardiology | Admitting: Interventional Cardiology

## 2015-01-06 DIAGNOSIS — Z48812 Encounter for surgical aftercare following surgery on the circulatory system: Secondary | ICD-10-CM | POA: Diagnosis not present

## 2015-01-06 NOTE — Therapy (Signed)
[place], [place], [address] Phone: [phone]   Fax:  [phone]  Patient Details  Name: [name] MRN: [id number] Date of Birth: 11-13-1935 Referring Provider:  No ref. provider found  Encounter Date: 01/06/2015     PT LONG TERM GOAL #1    TitleMET Pt. will demo/verbalize techniques to reduce risk of re-injury to include posture, lifting, body mechanics related to RT. UE   Status On-going   PT LONG TERM GOAL #2   TitleMET Pt will be I with advanced HEP   Status On-going   PT LONG TERM GOAL #3   TitleMET Pt will complete grooming, ADLs with min occasional pain in Rt. UE   Status On-going   PT LONG TERM GOAL #4   TitleMET Pt will be able to reach overhead for light item without increase in pain   Status On-going   PT LONG TERM GOAL #5   Title DID NOT ASSESS FOTO will decrease to 38% or less limited in functional use of Rt. UE.   Status Unable to assess                 PHYSICAL THERAPY DISCHARGE SUMMARY  Visits from Start of Care: 3  Current functional level related to goals / functional outcomes: See above for goals met as of 12/20/14.  Updated today 01/06/15 as the patient came in for appt earlier in the wee (01/02/15) and stated she had no limitations, was very busy with husband's care and doing cardiac rehab 3 times per week.  Given DC with alternative to re-start in several months if necessary.    Remaining deficits: None limiting function.    Education / Equipment: HEP, RICE  Plan: Patient agrees to discharge.  Patient goals were met. Patient is being discharged due to meeting the stated rehab goals.  ?????      PAA,JENNIFER 01/06/2015, 9:05 AM  [facility] [address] [place], [place], [address] Phone: [phone]   Fax:  [phone]

## 2015-01-09 ENCOUNTER — Encounter (HOSPITAL_COMMUNITY): Payer: BC Managed Care – PPO

## 2015-01-09 ENCOUNTER — Encounter (HOSPITAL_COMMUNITY)
Admission: RE | Admit: 2015-01-09 | Discharge: 2015-01-09 | Disposition: A | Discharge status: Home / Self Care | Payer: BC Managed Care – PPO | Source: Ambulatory Visit | Attending: Interventional Cardiology | Admitting: Interventional Cardiology

## 2015-01-09 DIAGNOSIS — Z48812 Encounter for surgical aftercare following surgery on the circulatory system: Secondary | ICD-10-CM | POA: Diagnosis not present

## 2015-01-09 NOTE — Progress Notes (Signed)
[name] 79 y.o. female Nutrition Note Spoke with pt. Nutrition Plan and Nutrition Survey goals reviewed with pt. Pt is following Step 2 of the Therapeutic Lifestyle Changes diet. Pt is pre-diabetic. Pre-diabetes discussed. Pt states she was aware she was "on the border" but unaware of "how close I am to being diabetic." Pt expressed understanding of the information reviewed. Pt aware of nutrition education classes offered. Pt states she has attended nutrition classes on previous admission. Lab Results  Component Value Date   HGBA1C 6.4* 11/08/2014   Nutrition Diagnosis ? Food-and nutrition-related knowledge deficit related to lack of exposure to information as related to diagnosis of: ? CVD ? Pre-DM ? Obesity related to excessive energy intake as evidenced by a BMI of 33.5  Nutrition RX/ Estimated Daily Nutrition Needs for: wt loss 1200-1700kcal, 30-45 gm fat, 7-13 gm sat fat, 1.1-1.7 gm trans-fat, <1500 mg sodium  Nutrition Intervention ? Pt's individual nutrition plan reviewed with pt. ? Benefits of adopting Therapeutic Lifestyle Changes discussed when Medficts reviewed. ? Pt to attend the Portion Distortion class  ? Pt given handouts for: ? Nutrition I class ? Nutrition II class  ? Continue client-centered nutrition education by RD, as part of interdisciplinary care. Goal(s) ? Pt to identify food quantities necessary to achieve: ? wt loss to a goal wt of 182-200 lb (82.6-90.8 kg) at graduation from cardiac rehab.  ? Pt able to name foods that affect blood glucose   Monitor and Evaluate progress toward nutrition goal with team. Nutrition Risk: Change to Moderate [name], M.Ed, RD, LDN, CDE 01/09/2015 11:05 AM

## 2015-01-10 ENCOUNTER — Ambulatory Visit: Payer: BC Managed Care – PPO | Admitting: Physical Therapy

## 2015-01-11 ENCOUNTER — Encounter (HOSPITAL_COMMUNITY)
Admission: RE | Admit: 2015-01-11 | Discharge: 2015-01-11 | Disposition: A | Discharge status: Home / Self Care | Payer: BC Managed Care – PPO | Source: Ambulatory Visit | Attending: Interventional Cardiology | Admitting: Interventional Cardiology

## 2015-01-11 ENCOUNTER — Encounter (HOSPITAL_COMMUNITY): Payer: BC Managed Care – PPO

## 2015-01-11 DIAGNOSIS — Z48812 Encounter for surgical aftercare following surgery on the circulatory system: Secondary | ICD-10-CM | POA: Diagnosis not present

## 2015-01-13 ENCOUNTER — Encounter (HOSPITAL_COMMUNITY): Payer: BC Managed Care – PPO

## 2015-01-13 ENCOUNTER — Encounter (HOSPITAL_COMMUNITY)
Admission: RE | Admit: 2015-01-13 | Discharge: 2015-01-13 | Disposition: A | Discharge status: Home / Self Care | Payer: BC Managed Care – PPO | Source: Ambulatory Visit | Attending: Interventional Cardiology | Admitting: Interventional Cardiology

## 2015-01-13 DIAGNOSIS — Z48812 Encounter for surgical aftercare following surgery on the circulatory system: Secondary | ICD-10-CM | POA: Diagnosis not present

## 2015-01-13 LAB — GLUCOSE, CAPILLARY: Glucose-Capillary: 91 mg/dL (ref 65–99)

## 2015-01-16 ENCOUNTER — Encounter (HOSPITAL_COMMUNITY): Payer: BC Managed Care – PPO

## 2015-01-16 ENCOUNTER — Encounter (HOSPITAL_COMMUNITY)
Admission: RE | Admit: 2015-01-16 | Discharge: 2015-01-16 | Disposition: A | Discharge status: Home / Self Care | Payer: BC Managed Care – PPO | Source: Ambulatory Visit | Attending: Interventional Cardiology | Admitting: Interventional Cardiology

## 2015-01-16 DIAGNOSIS — Z48812 Encounter for surgical aftercare following surgery on the circulatory system: Secondary | ICD-10-CM | POA: Diagnosis not present

## 2015-01-18 ENCOUNTER — Encounter (HOSPITAL_COMMUNITY)
Admission: RE | Admit: 2015-01-18 | Discharge: 2015-01-18 | Disposition: A | Discharge status: Home / Self Care | Payer: BC Managed Care – PPO | Source: Ambulatory Visit | Attending: Interventional Cardiology | Admitting: Interventional Cardiology

## 2015-01-18 ENCOUNTER — Encounter (HOSPITAL_COMMUNITY): Payer: BC Managed Care – PPO

## 2015-01-18 DIAGNOSIS — Z48812 Encounter for surgical aftercare following surgery on the circulatory system: Secondary | ICD-10-CM | POA: Diagnosis not present

## 2015-01-19 ENCOUNTER — Encounter: Payer: Self-pay | Admitting: Interventional Cardiology

## 2015-01-20 ENCOUNTER — Telehealth: Payer: Self-pay

## 2015-01-20 ENCOUNTER — Encounter (HOSPITAL_COMMUNITY): Payer: BC Managed Care – PPO

## 2015-01-20 ENCOUNTER — Encounter (HOSPITAL_COMMUNITY)
Admission: RE | Admit: 2015-01-20 | Discharge: 2015-01-20 | Disposition: A | Discharge status: Home / Self Care | Payer: BC Managed Care – PPO | Source: Ambulatory Visit | Attending: Interventional Cardiology | Admitting: Interventional Cardiology

## 2015-01-20 DIAGNOSIS — Z48812 Encounter for surgical aftercare following surgery on the circulatory system: Secondary | ICD-10-CM | POA: Diagnosis not present

## 2015-01-20 NOTE — Telephone Encounter (Signed)
-----   Message from Lyn RecordsHenry W Smith, MD sent at 01/19/2015  6:04 PM EDT ----- Lipids are terrible. Please refer to the lipid clinic due to statin intolerance

## 2015-01-23 ENCOUNTER — Other Ambulatory Visit: Payer: Medicare Other

## 2015-01-23 ENCOUNTER — Encounter (HOSPITAL_COMMUNITY): Payer: BC Managed Care – PPO

## 2015-01-23 ENCOUNTER — Encounter (HOSPITAL_COMMUNITY)
Admission: RE | Admit: 2015-01-23 | Discharge: 2015-01-23 | Disposition: A | Discharge status: Home / Self Care | Payer: BC Managed Care – PPO | Source: Ambulatory Visit | Attending: Interventional Cardiology | Admitting: Interventional Cardiology

## 2015-01-23 DIAGNOSIS — Z48812 Encounter for surgical aftercare following surgery on the circulatory system: Secondary | ICD-10-CM | POA: Diagnosis not present

## 2015-01-25 ENCOUNTER — Encounter (HOSPITAL_COMMUNITY)
Admission: RE | Admit: 2015-01-25 | Discharge: 2015-01-25 | Disposition: A | Discharge status: Home / Self Care | Payer: BC Managed Care – PPO | Source: Ambulatory Visit | Attending: Interventional Cardiology | Admitting: Interventional Cardiology

## 2015-01-25 ENCOUNTER — Encounter (HOSPITAL_COMMUNITY): Payer: BC Managed Care – PPO

## 2015-01-25 DIAGNOSIS — Z48812 Encounter for surgical aftercare following surgery on the circulatory system: Secondary | ICD-10-CM | POA: Diagnosis not present

## 2015-01-27 ENCOUNTER — Encounter (HOSPITAL_COMMUNITY): Payer: BC Managed Care – PPO

## 2015-01-27 ENCOUNTER — Encounter (HOSPITAL_COMMUNITY)
Admission: RE | Admit: 2015-01-27 | Discharge: 2015-01-27 | Disposition: A | Discharge status: Home / Self Care | Payer: BC Managed Care – PPO | Source: Ambulatory Visit | Attending: Interventional Cardiology | Admitting: Interventional Cardiology

## 2015-01-27 DIAGNOSIS — Z48812 Encounter for surgical aftercare following surgery on the circulatory system: Secondary | ICD-10-CM | POA: Diagnosis not present

## 2015-01-31 ENCOUNTER — Ambulatory Visit (INDEPENDENT_AMBULATORY_CARE_PROVIDER_SITE_OTHER): Payer: Medicare Other | Admitting: Pharmacist

## 2015-01-31 DIAGNOSIS — E785 Hyperlipidemia, unspecified: Secondary | ICD-10-CM | POA: Diagnosis not present

## 2015-01-31 NOTE — Patient Instructions (Signed)
Continue taking Livalo 2mg  as often as you can.   Have Dr. Renae GlossShelton send a copy of your lab results in June to Dr. Michaelle CopasSmith's office.  (fax: [phone]).

## 2015-01-31 NOTE — Progress Notes (Signed)
HPI      Cardiology Office Note   Date:  01/31/2015   ID:  [name], DOB 1935/09/17, MRN [id number]  PCP:  [name]  Cardiologist:  Catherine PrimeHenry Smith, [name]  Chief Complaint  Patient presents with  . Hyperlipidemia      History of Present Illness: [name] is a 79 y.o. female who presents for Lipid Clinic referral.  She was seen by [name] in May.  At that visit, she reported that Dr. Renae GlossShelton had changed her from once a week Lipitor to Livalo 2mg  daily and CoQ10 300mg  for her cholesterol.  She is taking the Livalo three days a week because any additional doses causes her to have an "overwhelming feeling".  She states this is different than the anxiety she has already.  She has decreased her CoQ10 from 300mg  to 100mg .    Pt has a PMH significant for ASCVD s/p stent to the LAD and circumflex and HTN.   RF: CAD, Age LDL goal- <70 mg/dL Current Medications- Livalo 2mg  three days of the week.  Intolerances: Tried- Lipitor, Crestor (muscle cramps), Zocor, Zetia (took for about 3 months with no problems then had muscle cramps)  Labs:  12/2014- TC 203, TG 194, HDL 39, LDL- 125, LDL-P- 1944   Current Outpatient Prescriptions  Medication Sig Dispense Refill  . acetaminophen (TYLENOL) 500 MG tablet Take 500 mg by mouth daily as needed (pain).    [name]. albuterol (PROVENTIL HFA;VENTOLIN HFA) 108 (90 BASE) MCG/ACT inhaler Inhale 2 puffs into the lungs every 6 (six) hours as needed for wheezing. 1 Inhaler 3  . ALPRAZolam (XANAX) 0.25 MG tablet Take 0.25 mg by mouth 2 (two) times daily. anxiety    . aspirin EC 81 MG tablet Take 81 mg by mouth daily.      . clopidogrel (PLAVIX) 75 MG tablet Take 1 tablet (75 mg total) by mouth daily. 30 tablet 2  . Coenzyme Q10 (COQ-10) 100 MG CAPS Take 100 mg by mouth daily.    [name]. conjugated estrogens (PREMARIN) vaginal cream Place 1 Applicatorful vaginally daily as needed (dryness). As needed.    . Diphenhydramine-APAP, sleep,  (TYLENOL PM EXTRA STRENGTH PO) Take 1 tablet by mouth at bedtime as needed.    . docusate sodium (COLACE) 100 MG capsule Take 1 capsule (100 mg total) by mouth 2 (two) times daily as needed for mild constipation. 60 capsule 0  . fluticasone (FLONASE) 50 MCG/ACT nasal spray Place 2 sprays into both nostrils daily. 1 g 2  . furosemide (LASIX) 20 MG tablet Take 10 mg by mouth daily.     [name]. HYDROcodone-acetaminophen (NORCO/VICODIN) 5-325 MG per tablet Take 0.5-1 tablets by mouth every 6 (six) hours as needed for moderate pain. 20 tablet 0  . lidocaine (LIDODERM) 5 % Place 1 patch onto the skin daily as needed (pain).     [name]. loratadine (CLARITIN) 10 MG tablet Take 10 mg by mouth daily.     [name]. LYRICA 75 MG capsule Take 75 mg by mouth 2 (two) times daily.    . metoprolol tartrate (LOPRESSOR) 25 MG tablet Take 0.5 tablets (12.5 mg total) by mouth 2 (two) times daily. 60 tablet 2  . nitroGLYCERIN (NITROSTAT) 0.4 MG SL tablet Place 1 tablet (0.4 mg total) under the tongue every 5 (five) minutes as needed for chest pain (CP or SOB). 25 tablet 12  . Pitavastatin Calcium (LIVALO) 2 MG TABS Take 1 tablet (2 mg total) by mouth daily.    .[name]  potassium chloride SA (K-DUR,KLOR-CON) 20 MEQ tablet Take 20 mEq by mouth daily.    . valsartan-hydrochlorothiazide (DIOVAN-HCT) 160-12.5 MG per tablet Take 1 tablet by mouth daily.     . Vitamin D, Ergocalciferol, (DRISDOL) 50000 UNITS CAPS capsule Take 50,000 Units by mouth every 7 (seven) days.    [name] zolpidem (AMBIEN) 10 MG tablet Take 5 mg by mouth daily as needed for sleep.     [name] UNABLE TO FIND Outpatient PT and OT for right shoulder pain. 1 each 0  . [DISCONTINUED] ezetimibe (ZETIA) 10 MG tablet Take 10 mg by mouth daily.      . [DISCONTINUED] metoprolol (TOPROL-XL) 50 MG 24 hr tablet Take 50 mg by mouth daily.      . [DISCONTINUED] potassium chloride (KLOR-CON) 20 MEQ packet Take 20 mEq by mouth daily.       No current facility-administered medications for this visit.     Allergies:   Adhesive; Codeine; and Elavil    ASSESSMENT AND PLAN:  Hyperlipidemia - Pt just started Livalo at the end of April.  She reports she is able to tolerate at 3 days of the week but cannot titrate any higher.  She is scheduled to see her PCP again on June 23 to recheck labs.  Will have her recheck and fax the results to our office.   If still elevated, may need to try Livalo  three days of the week or possibly retry Zetia given she was able to tolerate for several months previously.     Edrick Oh [facility]  01/31/2015 2:01 PM    [facility] [address] [place], [place], [place]  [address] Phone: [phone]; Fax: [phone]

## 2015-02-01 ENCOUNTER — Encounter (HOSPITAL_COMMUNITY): Payer: Medicare Other

## 2015-02-01 ENCOUNTER — Encounter (HOSPITAL_COMMUNITY)
Admission: RE | Admit: 2015-02-01 | Discharge: 2015-02-01 | Disposition: A | Discharge status: Home / Self Care | Payer: Medicare Other | Source: Ambulatory Visit | Attending: Interventional Cardiology | Admitting: Interventional Cardiology

## 2015-02-01 DIAGNOSIS — Z48812 Encounter for surgical aftercare following surgery on the circulatory system: Secondary | ICD-10-CM | POA: Diagnosis not present

## 2015-02-01 DIAGNOSIS — I252 Old myocardial infarction: Secondary | ICD-10-CM | POA: Insufficient documentation

## 2015-02-01 DIAGNOSIS — Z955 Presence of coronary angioplasty implant and graft: Secondary | ICD-10-CM | POA: Diagnosis not present

## 2015-02-01 DIAGNOSIS — I2510 Atherosclerotic heart disease of native coronary artery without angina pectoris: Secondary | ICD-10-CM | POA: Insufficient documentation

## 2015-02-03 ENCOUNTER — Encounter (HOSPITAL_COMMUNITY)
Admission: RE | Admit: 2015-02-03 | Discharge: 2015-02-03 | Disposition: A | Discharge status: Home / Self Care | Payer: Medicare Other | Source: Ambulatory Visit | Attending: Interventional Cardiology | Admitting: Interventional Cardiology

## 2015-02-03 ENCOUNTER — Encounter (HOSPITAL_COMMUNITY): Payer: Medicare Other

## 2015-02-03 DIAGNOSIS — Z48812 Encounter for surgical aftercare following surgery on the circulatory system: Secondary | ICD-10-CM | POA: Diagnosis not present

## 2015-02-06 ENCOUNTER — Encounter (HOSPITAL_COMMUNITY)
Admission: RE | Admit: 2015-02-06 | Discharge: 2015-02-06 | Disposition: A | Discharge status: Home / Self Care | Payer: Medicare Other | Source: Ambulatory Visit | Attending: Interventional Cardiology | Admitting: Interventional Cardiology

## 2015-02-06 ENCOUNTER — Encounter (HOSPITAL_COMMUNITY): Payer: Medicare Other

## 2015-02-06 DIAGNOSIS — Z48812 Encounter for surgical aftercare following surgery on the circulatory system: Secondary | ICD-10-CM | POA: Diagnosis not present

## 2015-02-08 ENCOUNTER — Encounter (HOSPITAL_COMMUNITY)
Admission: RE | Admit: 2015-02-08 | Discharge: 2015-02-08 | Disposition: A | Discharge status: Home / Self Care | Payer: Medicare Other | Source: Ambulatory Visit | Attending: Interventional Cardiology | Admitting: Interventional Cardiology

## 2015-02-08 ENCOUNTER — Encounter (HOSPITAL_COMMUNITY): Payer: Medicare Other

## 2015-02-08 DIAGNOSIS — Z48812 Encounter for surgical aftercare following surgery on the circulatory system: Secondary | ICD-10-CM | POA: Diagnosis not present

## 2015-02-10 ENCOUNTER — Encounter (HOSPITAL_COMMUNITY)
Admission: RE | Admit: 2015-02-10 | Discharge: 2015-02-10 | Disposition: A | Discharge status: Home / Self Care | Payer: Medicare Other | Source: Ambulatory Visit | Attending: Interventional Cardiology | Admitting: Interventional Cardiology

## 2015-02-10 ENCOUNTER — Encounter (HOSPITAL_COMMUNITY): Payer: Medicare Other

## 2015-02-10 DIAGNOSIS — Z48812 Encounter for surgical aftercare following surgery on the circulatory system: Secondary | ICD-10-CM | POA: Diagnosis not present

## 2015-02-13 ENCOUNTER — Encounter (HOSPITAL_COMMUNITY)
Admission: RE | Admit: 2015-02-13 | Discharge: 2015-02-13 | Disposition: A | Discharge status: Home / Self Care | Payer: Medicare Other | Source: Ambulatory Visit | Attending: Interventional Cardiology | Admitting: Interventional Cardiology

## 2015-02-13 ENCOUNTER — Encounter (HOSPITAL_COMMUNITY): Payer: Medicare Other

## 2015-02-13 DIAGNOSIS — Z48812 Encounter for surgical aftercare following surgery on the circulatory system: Secondary | ICD-10-CM | POA: Diagnosis not present

## 2015-02-15 ENCOUNTER — Encounter (HOSPITAL_COMMUNITY): Payer: Medicare Other

## 2015-02-15 ENCOUNTER — Encounter (HOSPITAL_COMMUNITY)
Admission: RE | Admit: 2015-02-15 | Discharge: 2015-02-15 | Disposition: A | Discharge status: Home / Self Care | Payer: Medicare Other | Source: Ambulatory Visit | Attending: Interventional Cardiology | Admitting: Interventional Cardiology

## 2015-02-15 DIAGNOSIS — Z48812 Encounter for surgical aftercare following surgery on the circulatory system: Secondary | ICD-10-CM | POA: Diagnosis not present

## 2015-02-17 ENCOUNTER — Encounter (HOSPITAL_COMMUNITY): Payer: Medicare Other

## 2015-02-17 ENCOUNTER — Encounter (HOSPITAL_COMMUNITY)
Admission: RE | Admit: 2015-02-17 | Discharge: 2015-02-17 | Disposition: A | Discharge status: Home / Self Care | Payer: Medicare Other | Source: Ambulatory Visit | Attending: Interventional Cardiology | Admitting: Interventional Cardiology

## 2015-02-17 DIAGNOSIS — Z48812 Encounter for surgical aftercare following surgery on the circulatory system: Secondary | ICD-10-CM | POA: Diagnosis not present

## 2015-02-20 ENCOUNTER — Encounter (HOSPITAL_COMMUNITY)
Admission: RE | Admit: 2015-02-20 | Discharge: 2015-02-20 | Disposition: A | Discharge status: Home / Self Care | Payer: Medicare Other | Source: Ambulatory Visit | Attending: Interventional Cardiology | Admitting: Interventional Cardiology

## 2015-02-20 ENCOUNTER — Encounter (HOSPITAL_COMMUNITY): Payer: Medicare Other

## 2015-02-20 DIAGNOSIS — Z48812 Encounter for surgical aftercare following surgery on the circulatory system: Secondary | ICD-10-CM | POA: Diagnosis not present

## 2015-02-22 ENCOUNTER — Encounter (HOSPITAL_COMMUNITY)
Admission: RE | Admit: 2015-02-22 | Discharge: 2015-02-22 | Disposition: A | Discharge status: Home / Self Care | Payer: Medicare Other | Source: Ambulatory Visit | Attending: Interventional Cardiology | Admitting: Interventional Cardiology

## 2015-02-22 ENCOUNTER — Encounter (HOSPITAL_COMMUNITY): Payer: Medicare Other

## 2015-02-22 DIAGNOSIS — Z48812 Encounter for surgical aftercare following surgery on the circulatory system: Secondary | ICD-10-CM | POA: Diagnosis not present

## 2015-02-24 ENCOUNTER — Encounter (HOSPITAL_COMMUNITY): Payer: Medicare Other

## 2015-02-24 ENCOUNTER — Encounter (HOSPITAL_COMMUNITY)
Admission: RE | Admit: 2015-02-24 | Discharge: 2015-02-24 | Disposition: A | Discharge status: Home / Self Care | Payer: Medicare Other | Source: Ambulatory Visit | Attending: Interventional Cardiology | Admitting: Interventional Cardiology

## 2015-02-24 DIAGNOSIS — Z48812 Encounter for surgical aftercare following surgery on the circulatory system: Secondary | ICD-10-CM | POA: Diagnosis not present

## 2015-02-27 ENCOUNTER — Encounter (HOSPITAL_COMMUNITY): Payer: Medicare Other

## 2015-02-27 ENCOUNTER — Encounter (HOSPITAL_COMMUNITY)
Admission: RE | Admit: 2015-02-27 | Discharge: 2015-02-27 | Disposition: A | Discharge status: Home / Self Care | Payer: Medicare Other | Source: Ambulatory Visit | Attending: Interventional Cardiology | Admitting: Interventional Cardiology

## 2015-02-27 DIAGNOSIS — Z48812 Encounter for surgical aftercare following surgery on the circulatory system: Secondary | ICD-10-CM | POA: Diagnosis not present

## 2015-03-01 ENCOUNTER — Encounter (HOSPITAL_COMMUNITY): Payer: Medicare Other

## 2015-03-01 ENCOUNTER — Encounter (HOSPITAL_COMMUNITY)
Admission: RE | Admit: 2015-03-01 | Discharge: 2015-03-01 | Disposition: A | Discharge status: Home / Self Care | Payer: Medicare Other | Source: Ambulatory Visit | Attending: Interventional Cardiology | Admitting: Interventional Cardiology

## 2015-03-01 DIAGNOSIS — Z48812 Encounter for surgical aftercare following surgery on the circulatory system: Secondary | ICD-10-CM | POA: Diagnosis not present

## 2015-03-03 ENCOUNTER — Encounter (HOSPITAL_COMMUNITY): Payer: BC Managed Care – PPO

## 2015-03-06 ENCOUNTER — Encounter (HOSPITAL_COMMUNITY): Payer: BC Managed Care – PPO

## 2015-03-08 ENCOUNTER — Encounter (HOSPITAL_COMMUNITY): Payer: BC Managed Care – PPO

## 2015-03-10 ENCOUNTER — Encounter (HOSPITAL_COMMUNITY): Payer: BC Managed Care – PPO

## 2015-03-10 ENCOUNTER — Encounter (HOSPITAL_COMMUNITY)
Admission: RE | Admit: 2015-03-10 | Discharge: 2015-03-10 | Disposition: A | Discharge status: Home / Self Care | Payer: BC Managed Care – PPO | Source: Ambulatory Visit | Attending: Interventional Cardiology | Admitting: Interventional Cardiology

## 2015-03-10 DIAGNOSIS — I252 Old myocardial infarction: Secondary | ICD-10-CM | POA: Insufficient documentation

## 2015-03-10 DIAGNOSIS — Z48812 Encounter for surgical aftercare following surgery on the circulatory system: Secondary | ICD-10-CM | POA: Insufficient documentation

## 2015-03-10 DIAGNOSIS — Z955 Presence of coronary angioplasty implant and graft: Secondary | ICD-10-CM | POA: Insufficient documentation

## 2015-03-10 DIAGNOSIS — I2510 Atherosclerotic heart disease of native coronary artery without angina pectoris: Secondary | ICD-10-CM | POA: Insufficient documentation

## 2015-03-13 ENCOUNTER — Encounter (HOSPITAL_COMMUNITY): Payer: BC Managed Care – PPO

## 2015-03-13 ENCOUNTER — Encounter (HOSPITAL_COMMUNITY)
Admission: RE | Admit: 2015-03-13 | Discharge: 2015-03-13 | Disposition: A | Discharge status: Home / Self Care | Payer: BC Managed Care – PPO | Source: Ambulatory Visit | Attending: Interventional Cardiology | Admitting: Interventional Cardiology

## 2015-03-13 DIAGNOSIS — Z48812 Encounter for surgical aftercare following surgery on the circulatory system: Secondary | ICD-10-CM | POA: Diagnosis not present

## 2015-03-15 ENCOUNTER — Encounter (HOSPITAL_COMMUNITY)
Admission: RE | Admit: 2015-03-15 | Discharge: 2015-03-15 | Disposition: A | Discharge status: Home / Self Care | Payer: BC Managed Care – PPO | Source: Ambulatory Visit | Attending: Interventional Cardiology | Admitting: Interventional Cardiology

## 2015-03-15 ENCOUNTER — Encounter (HOSPITAL_COMMUNITY): Payer: BC Managed Care – PPO

## 2015-03-15 DIAGNOSIS — Z48812 Encounter for surgical aftercare following surgery on the circulatory system: Secondary | ICD-10-CM | POA: Diagnosis not present

## 2015-03-17 ENCOUNTER — Encounter (HOSPITAL_COMMUNITY): Payer: BC Managed Care – PPO

## 2015-03-17 ENCOUNTER — Encounter (HOSPITAL_COMMUNITY)
Admission: RE | Admit: 2015-03-17 | Discharge: 2015-03-17 | Disposition: A | Discharge status: Home / Self Care | Payer: BC Managed Care – PPO | Source: Ambulatory Visit | Attending: Interventional Cardiology | Admitting: Interventional Cardiology

## 2015-03-17 DIAGNOSIS — Z48812 Encounter for surgical aftercare following surgery on the circulatory system: Secondary | ICD-10-CM | POA: Diagnosis not present

## 2015-03-20 ENCOUNTER — Encounter (HOSPITAL_COMMUNITY): Payer: BC Managed Care – PPO

## 2015-03-20 ENCOUNTER — Encounter (HOSPITAL_COMMUNITY)
Admission: RE | Admit: 2015-03-20 | Discharge: 2015-03-20 | Disposition: A | Discharge status: Home / Self Care | Payer: BC Managed Care – PPO | Source: Ambulatory Visit | Attending: Interventional Cardiology | Admitting: Interventional Cardiology

## 2015-03-20 DIAGNOSIS — Z48812 Encounter for surgical aftercare following surgery on the circulatory system: Secondary | ICD-10-CM | POA: Diagnosis not present

## 2015-03-22 ENCOUNTER — Encounter (HOSPITAL_COMMUNITY)
Admission: RE | Admit: 2015-03-22 | Discharge: 2015-03-22 | Disposition: A | Discharge status: Home / Self Care | Payer: BC Managed Care – PPO | Source: Ambulatory Visit | Attending: Interventional Cardiology | Admitting: Interventional Cardiology

## 2015-03-22 ENCOUNTER — Encounter (HOSPITAL_COMMUNITY): Payer: BC Managed Care – PPO

## 2015-03-22 DIAGNOSIS — Z48812 Encounter for surgical aftercare following surgery on the circulatory system: Secondary | ICD-10-CM | POA: Diagnosis not present

## 2015-03-22 NOTE — Progress Notes (Signed)
Pt graduated from cardiac rehab program today with completion of 36 exercise sessions in Phase II. Pt maintained good attendance and progressed nicely during her participation in rehab as evidenced by increased MET level.   Medication list reconciled. Repeat  PHQ score- 0 .  Pt has made significant lifestyle changes and should be commended for her success. Pt feels her has achieved her goals during cardiac rehab.   Pt plans to continue exercise at the [facility].[name] has some stress at home caring for her husband [name]. [name] has a good support system with her family and friends. We are proud of [name]'s progress.

## 2015-03-24 ENCOUNTER — Encounter (HOSPITAL_COMMUNITY): Payer: BC Managed Care – PPO

## 2015-05-02 NOTE — Progress Notes (Signed)
Cardiology Office Note   Date:  05/03/2015   ID:  [name], DOB [birth year], MRN [id number]  PCP:  [name]., MD  Cardiologist:  Lesleigh Noe, MD   Chief Complaint  Patient presents with  . Coronary Artery Disease      History of Present Illness: [name] is a 79 y.o. female who presents for chronic musculoskeletal syndrome, coronary artery disease with prior LAD and circumflex drug-eluting stents, hypertension, and hyperlipidemia.  The patient notes left lower extremity swelling. She's been having some dyspnea. Dyspnea is associated with PND/orthopnea. The left lower extremity is swollen more than the right. She states she has had trouble with chronic left lower extremity swelling but this is much worse than prior. Had a recent long road trip to the [facility].    Past Medical History  Diagnosis Date  . Coronary artery disease   . Hypertension   . Anxiety   . Fibromyalgia   . Dysrhythmia     Palpitations, possible atrial fibrillation  . Vertigo 08/27/2006  . Ventricular hypertrophy   . High cholesterol   . Anginal pain   . Exertional shortness of breath   . History of stomach ulcers     Remotely  . GERD (gastroesophageal reflux disease)   . Arthritis     Multiple joints  . Osteoporosis   . Acute myocardial infarction, unspecified site, initial episode of care   . Acute myocardial infarction of other lateral wall, initial episode of care     Past Surgical History  Procedure Laterality Date  . Hernia repair    . Laparotomy      "day after oophorectomy; had bowel obstruction" (01/27/2013)  . Coronary angioplasty with stent placement  06/2005; 01/27/2013     2.515 mm Cypher stent to LAD in 2006; 2.75 x 12 Promus DES to the circumflex 2014    . Cardiac catheterization  10/2006    [name] 08/03/2007 (01/27/2013)  . Tonsillectomy and adenoidectomy  ~ 1952  . Abdominal hysterectomy  1970's  . Laparoscopic salpingo oopherectomy   2000  . Diagnostic laparoscopy  2000    lap abdominal lysis of adhesions  . Breast lumpectomy Right 1970's    benign   . Breast biopsy Right 1970's  . Left heart catheterization with coronary angiogram N/A 01/27/2013    Procedure: LEFT HEART CATHETERIZATION WITH CORONARY ANGIOGRAM;  Surgeon: Corky Crafts, MD;  Location: [name] CATH LAB;  Service: Cardiovascular;  Laterality: N/A;  . Left heart catheterization with coronary angiogram N/A 11/10/2014    Procedure: LEFT HEART CATHETERIZATION WITH CORONARY ANGIOGRAM;  Surgeon: Lennette Bihari, MD; left main okay, LAD 75% ISR, FFR 0.75, s/p Angiosculpt scoring balloon and 2.7518 mm Resolute DES, CFX 70%, stent patent, RCA 50%  . Esophagogastroduodenoscopy N/A 11/10/2014    Procedure: ESOPHAGOGASTRODUODENOSCOPY (EGD);  Surgeon: Jeani Hawking, MD;  Location: [facility] ENDOSCOPY;  Service: Endoscopy;  Laterality: N/A;     Current Outpatient Prescriptions  Medication Sig Dispense Refill  . acetaminophen (TYLENOL) 500 MG tablet Take 500 mg by mouth daily as needed (pain).    [name] albuterol (PROVENTIL HFA;VENTOLIN HFA) 108 (90 BASE) MCG/ACT inhaler Inhale 2 puffs into the lungs every 6 (six) hours as needed for wheezing. 1 Inhaler 3  . ALPRAZolam (XANAX) 0.25 MG tablet Take 0.25 mg by mouth 2 (two) times daily. anxiety    . aspirin EC 81 MG tablet Take 81 mg by mouth daily.      . clopidogrel (PLAVIX) 75  MG tablet Take 1 tablet (75 mg total) by mouth daily. 30 tablet 2  . Coenzyme Q10 (COQ-10) 100 MG CAPS Take 100 mg by mouth daily.    [name] conjugated estrogens (PREMARIN) vaginal cream Place 1 Applicatorful vaginally daily as needed (dryness). As needed.    . Diphenhydramine-APAP, sleep, (TYLENOL PM EXTRA STRENGTH PO) Take 1 tablet by mouth at bedtime as needed.    . docusate sodium (COLACE) 100 MG capsule Take 1 capsule (100 mg total) by mouth 2 (two) times daily as needed for mild constipation. 60 capsule 0  . fluticasone (FLONASE) 50 MCG/ACT nasal spray Place 2  sprays into both nostrils daily. 1 g 2  . furosemide (LASIX) 20 MG tablet Take 10 mg by mouth daily.     [name] HYDROcodone-acetaminophen (NORCO/VICODIN) 5-325 MG per tablet Take 0.5-1 tablets by mouth every 6 (six) hours as needed for moderate pain. 20 tablet 0  . lidocaine (LIDODERM) 5 % Place 1 patch onto the skin daily as needed (pain).     [name] loratadine (CLARITIN) 10 MG tablet Take 10 mg by mouth daily.     [name] LYRICA 75 MG capsule Take 75 mg by mouth 2 (two) times daily.    . metoprolol tartrate (LOPRESSOR) 25 MG tablet Take 0.5 tablets (12.5 mg total) by mouth 2 (two) times daily. 60 tablet 2  . nitroGLYCERIN (NITROSTAT) 0.4 MG SL tablet Place 1 tablet (0.4 mg total) under the tongue every 5 (five) minutes as needed for chest pain (CP or SOB). 25 tablet 12  . Pitavastatin Calcium (LIVALO) 2 MG TABS Take 1 tablet (2 mg total) by mouth daily.    . potassium chloride SA (K-DUR,KLOR-CON) 20 MEQ tablet Take 20 mEq by mouth daily.    . valsartan-hydrochlorothiazide (DIOVAN-HCT) 160-12.5 MG per tablet Take 1 tablet by mouth daily.     . Vitamin D, Ergocalciferol, (DRISDOL) 50000 UNITS CAPS capsule Take 50,000 Units by mouth every 7 (seven) days.    [name] zolpidem (AMBIEN) 10 MG tablet Take 5 mg by mouth daily as needed for sleep.     . [DISCONTINUED] ezetimibe (ZETIA) 10 MG tablet Take 10 mg by mouth daily.      . [DISCONTINUED] metoprolol (TOPROL-XL) 50 MG 24 hr tablet Take 50 mg by mouth daily.      . [DISCONTINUED] potassium chloride (KLOR-CON) 20 MEQ packet Take 20 mEq by mouth daily.       No current facility-administered medications for this visit.    Allergies:   Adhesive; Codeine; and Elavil    Social History:  The patient  reports that she quit smoking about 23 years ago. Her smoking use included Cigarettes. She has a 4.32 pack-year smoking history. She has never used smokeless tobacco. She reports that she does not drink alcohol or use illicit drugs.   Family History:  The patient's family  history includes Other in her father and mother.    ROS:  Please see the history of present illness.   Otherwise, review of systems are positive for peripheral neuropathy recently diagnosed with pain in her legs bilaterally, difficulty sleeping, achy and stiff all over..   All other systems are reviewed and negative.    PHYSICAL EXAM: VS:  BP 122/78 mmHg  Pulse 88  Ht 5\' 7"  (1.702 m)  Wt 98.431 kg (217 lb)  BMI 33.98 kg/m2 , BMI Body mass index is 33.98 kg/(m^2). GEN: Well nourished, well developed, in no acute distress HEENT: normal Neck: no JVD, carotid bruits, or  masses Cardiac: RRR; no murmurs, rubs, or gallops,no edema  Respiratory:  clear to auscultation bilaterally, normal work of breathing GI: soft, nontender, nondistended, + BS MS: no deformity or atrophy Skin: warm and dry, no rash Neuro:  Strength and sensation are intact Psych: euthymic mood, full affect   EKG:  EKG is not ordered today   Recent Labs: 09/25/2014: B Natriuretic Peptide 46.8 11/10/2014: ALT 14 11/11/2014: Hemoglobin 10.2*; Platelets 217 11/12/2014: BUN 9; Creatinine, Ser 0.89; Potassium 4.1; Sodium 140    Lipid Panel    Component Value Date/Time   CHOL * 04/18/2009 0500    271        ATP III CLASSIFICATION:  <200     mg/dL   Desirable  161-096  mg/dL   Borderline High  >=045    mg/dL   High          TRIG 409 04/18/2009 0500   HDL 42 04/18/2009 0500   CHOLHDL 6.5 04/18/2009 0500   VLDL 23 04/18/2009 0500   LDLCALC * 04/18/2009 0500    206        Total Cholesterol/HDL:CHD Risk Coronary Heart Disease Risk Table                     Men   Women  1/2 Average Risk   3.4   3.3  Average Risk       5.0   4.4  2 X Average Risk   9.6   7.1  3 X Average Risk  23.4   11.0        Use the calculated Patient Ratio above and the CHD Risk Table to determine the patient's CHD Risk.        ATP III CLASSIFICATION (LDL):  <100     mg/dL   Optimal  811-914  mg/dL   Near or Above                     Optimal  130-159  mg/dL   Borderline  782-956  mg/dL   High  >213     mg/dL   Very High      Wt Readings from Last 3 Encounters:  05/03/15 98.431 kg (217 lb)  01/02/15 95.165 kg (209 lb 12.8 oz)  12/15/14 93.5 kg (206 lb 2.1 oz)      Other studies Reviewed: [name]    ASSESSMENT AND PLAN: 1. Paroxysmal atrial fibrillation No recent clinical recurrences  2. Coronary artery disease involving native coronary artery of native heart without angina pectoris Asymptomatic  3. Essential hypertension Controlled  4. Left lower extremity swelling and associated dyspnea Rule out DVT  5. Dyspnea Possibly related to diastolic heart failure. If DVT is present, pulmonary emboli would need to be considered.    Current medicines are reviewed at length with the patient today.  The patient has concerns regarding medicines.  The following changes have been made:  We will rule out DVT in the left lower extremity with an urgent venous Doppler study. I will otherwise increase her furosemide to 20 mg per day for at least 3 days to see if this helps dyspnea. If DVT is noted, she will need to have a CT angiogram of the lungs to rule out PE.  Labs/ tests ordered today include:  No orders of the defined types were placed in this encounter.     Left lower extremity venous Doppler study done as an urgent study today     Disposition:  FU with HS in 6 months  Signed, Lesleigh Noe, MD  05/03/2015 11:12 AM    [facility] [address] [place], [place], [place]  [address] Phone: [phone]; Fax: [phone]

## 2015-05-03 ENCOUNTER — Ambulatory Visit (INDEPENDENT_AMBULATORY_CARE_PROVIDER_SITE_OTHER): Payer: Medicare Other | Admitting: Interventional Cardiology

## 2015-05-03 ENCOUNTER — Encounter: Payer: Self-pay | Admitting: Interventional Cardiology

## 2015-05-03 VITALS — BP 122/78 | HR 88 | Ht 67.0 in | Wt 217.0 lb

## 2015-05-03 DIAGNOSIS — I10 Essential (primary) hypertension: Secondary | ICD-10-CM

## 2015-05-03 DIAGNOSIS — I2510 Atherosclerotic heart disease of native coronary artery without angina pectoris: Secondary | ICD-10-CM

## 2015-05-03 DIAGNOSIS — R0600 Dyspnea, unspecified: Secondary | ICD-10-CM

## 2015-05-03 DIAGNOSIS — R600 Localized edema: Secondary | ICD-10-CM | POA: Diagnosis not present

## 2015-05-03 DIAGNOSIS — I480 Paroxysmal atrial fibrillation: Secondary | ICD-10-CM

## 2015-05-03 NOTE — Patient Instructions (Signed)
Medication Instructions:  INCREASE Lasix to  daily Wed,Thurs,Fri. Call the office on Friday with an update  Labwork: None ordered  Testing/Procedures: Your physician has requested that you have a lower extremity venous duplex. During this test,  ultrasound are used to evaluate venous blood flow in the legs. Allow one hour for this exam. There are no restrictions or special instructions. (To be scheduled ASAP)   Follow-Up: Your physician wants you to follow-up in: 6 months with Dr.Smith You will receive a reminder letter in the mail two months in advance. If you don't receive a letter, please call our office to schedule the follow-up appointment.   Any Other Special Instructions Will Be Listed Below (If Applicable).

## 2015-05-05 ENCOUNTER — Telehealth: Payer: Self-pay | Admitting: Interventional Cardiology

## 2015-05-05 ENCOUNTER — Ambulatory Visit (HOSPITAL_COMMUNITY)
Admission: RE | Admit: 2015-05-05 | Discharge: 2015-05-05 | Disposition: A | Discharge status: Home / Self Care | Payer: Medicare Other | Source: Ambulatory Visit | Attending: Interventional Cardiology | Admitting: Interventional Cardiology

## 2015-05-05 DIAGNOSIS — E785 Hyperlipidemia, unspecified: Secondary | ICD-10-CM | POA: Diagnosis not present

## 2015-05-05 DIAGNOSIS — R600 Localized edema: Secondary | ICD-10-CM | POA: Insufficient documentation

## 2015-05-05 DIAGNOSIS — I10 Essential (primary) hypertension: Secondary | ICD-10-CM | POA: Insufficient documentation

## 2015-05-05 DIAGNOSIS — M7989 Other specified soft tissue disorders: Secondary | ICD-10-CM

## 2015-05-05 DIAGNOSIS — I2510 Atherosclerotic heart disease of native coronary artery without angina pectoris: Secondary | ICD-10-CM | POA: Diagnosis not present

## 2015-05-05 NOTE — Telephone Encounter (Signed)
Pt sts that she has her Le venous study today. The tech told her it was negative for a DVT. Adv her that it hs not yet been read by a physician Pt sts that she has had increased urine output with the increase  of lasix daily. Pt reports some improvement in sob and swelling.  Adv pt I will update Dr.Smith and call back with his recommendation.   Pt aware of Dr.Smith's recommendation. Continue lasix  daily. Pt is already taking potassium daily. Pt will come to the office on Tues 9/6 for a bmet. Pt should call the office on Tues with and update of symptoms.  Pt sts that she has compression stockings at home. She will wear them daily, and elevate her feet as much as she can.

## 2015-05-05 NOTE — Telephone Encounter (Signed)
New message     Calling to give update on swelling in let  Leg still hurts a little and it is still swollen.  Neg for blood clots.  Furosemide was increased.  Please call and let her know what to do next

## 2015-05-09 ENCOUNTER — Other Ambulatory Visit: Payer: Medicare Other

## 2015-05-10 ENCOUNTER — Other Ambulatory Visit (INDEPENDENT_AMBULATORY_CARE_PROVIDER_SITE_OTHER): Payer: Medicare Other | Admitting: *Deleted

## 2015-05-10 DIAGNOSIS — M7989 Other specified soft tissue disorders: Secondary | ICD-10-CM

## 2015-05-11 ENCOUNTER — Telehealth: Payer: Self-pay

## 2015-05-11 DIAGNOSIS — M7989 Other specified soft tissue disorders: Secondary | ICD-10-CM

## 2015-05-11 LAB — BASIC METABOLIC PANEL
BUN: 17 mg/dL (ref 6–23)
CHLORIDE: 103 meq/L (ref 96–112)
CO2: 34 mEq/L — ABNORMAL HIGH (ref 19–32)
CREATININE: 0.88 mg/dL (ref 0.40–1.20)
Calcium: 9.3 mg/dL (ref 8.4–10.5)
GFR: 80.13 mL/min (ref >60.00)
Glucose, Bld: 96 mg/dL (ref 70–99)
Potassium: 3.6 mEq/L (ref 3.5–5.1)
Sodium: 142 mEq/L (ref 135–145)

## 2015-05-11 NOTE — Telephone Encounter (Signed)
-----   Message from Lyn Records, MD sent at 05/09/2015  5:42 PM EDT ----- No blood clot in the lower extremities.

## 2015-05-11 NOTE — Telephone Encounter (Signed)
Follow up ° ° °Pt returning your call °

## 2015-05-11 NOTE — Telephone Encounter (Signed)
Returned pt call.lmtcb 

## 2015-05-11 NOTE — Telephone Encounter (Signed)
Called to give pt lab results and Dr.Smith's recommendation. lmtcb 

## 2015-05-11 NOTE — Telephone Encounter (Signed)
-----   Message from Lyn Records, MD sent at 05/11/2015 11:47 AM EDT ----- The labs are stable. If extremity swelling and breathing have improved, continue the current therapy. I would recommend increasing potassium to 30 mEq per day (1.5 tablets). Basic metabolic panel in 1 month.  If swelling still present and short of breath, we may need to further increase furosemide.

## 2015-05-12 MED ORDER — POTASSIUM CHLORIDE CRYS ER 20 MEQ PO TBCR: 30.0000 meq | EXTENDED_RELEASE_TABLET | Freq: Every day | ORAL | Status: DC

## 2015-05-12 NOTE — Telephone Encounter (Signed)
-----   Message from Henry W Smith, MD sent at 05/11/2015 11:47 AM EDT ----- The labs are stable. If extremity swelling and breathing have improved, continue the current therapy. I would recommend increasing potassium to 30 mEq per day (1.5 tablets). Basic metabolic panel in 1 month.  If swelling still present and short of breath, we may need to further increase furosemide. 

## 2015-05-12 NOTE — Telephone Encounter (Signed)
Pt aware of lab results and Dr.Smith's recommendation. The labs are stable. If extremity swelling and breathing have improved, continue the current therapy. I would recommend increasing potassium to 30 mEq per day (1.5 tablets). Basic metabolic panel in 1 month.  If swelling still present and short of breath, we may need to further increase furosemide.  Pt reports that she has had great improvement with her LE swelling and sob has improved. Rx sent to pt pharmacy for potassium increase. Lab appt scheduled 10/5 (bmet). Update fwd to Dr.Smith Pt verbalized understanding.

## 2015-06-07 ENCOUNTER — Other Ambulatory Visit (INDEPENDENT_AMBULATORY_CARE_PROVIDER_SITE_OTHER): Payer: Medicare Other | Admitting: *Deleted

## 2015-06-07 DIAGNOSIS — M7989 Other specified soft tissue disorders: Secondary | ICD-10-CM | POA: Diagnosis not present

## 2015-06-07 LAB — BASIC METABOLIC PANEL
BUN: 13 mg/dL (ref 7–25)
CHLORIDE: 104 mmol/L (ref 98–110)
CO2: 27 mmol/L (ref 20–31)
Calcium: 9.2 mg/dL (ref 8.6–10.4)
Creat: 0.91 mg/dL (ref 0.60–0.93)
Glucose, Bld: 86 mg/dL (ref 65–99)
POTASSIUM: 3.9 mmol/L (ref 3.5–5.3)
SODIUM: 140 mmol/L (ref 135–146)

## 2015-06-08 ENCOUNTER — Telehealth: Payer: Self-pay

## 2015-06-08 NOTE — Telephone Encounter (Signed)
-----   Message from Lyn Records, MD sent at 06/08/2015  7:17 AM EDT ----- Labs are normal. Continue current therapy as noted. ? Furosemide 20 mg daily

## 2015-06-08 NOTE — Telephone Encounter (Signed)
Pt aware of lab results with verbal understanding. Labs are normal. Continue current therapy as noted. Pt confirms that she is taking Furosemide  daily. Pt sts that her LE swelling has greatly improved.adv her that I will fwd and update to Dr.Smith

## 2015-12-29 ENCOUNTER — Ambulatory Visit (INDEPENDENT_AMBULATORY_CARE_PROVIDER_SITE_OTHER): Payer: Medicare Other | Admitting: Interventional Cardiology

## 2015-12-29 ENCOUNTER — Encounter: Payer: Self-pay | Admitting: Interventional Cardiology

## 2015-12-29 ENCOUNTER — Encounter (HOSPITAL_COMMUNITY): Payer: Self-pay | Admitting: General Practice

## 2015-12-29 ENCOUNTER — Inpatient Hospital Stay (HOSPITAL_COMMUNITY)
Admission: AD | Admit: 2015-12-29 | Discharge: 2016-01-02 | DRG: 246 | Disposition: A | Discharge status: Home / Self Care | Payer: Medicare Other | Source: Ambulatory Visit | Attending: Interventional Cardiology | Admitting: Interventional Cardiology

## 2015-12-29 ENCOUNTER — Inpatient Hospital Stay (HOSPITAL_COMMUNITY): Payer: Medicare Other

## 2015-12-29 ENCOUNTER — Other Ambulatory Visit: Payer: Self-pay

## 2015-12-29 VITALS — BP 110/80 | HR 75 | Ht 67.0 in | Wt 221.4 lb

## 2015-12-29 DIAGNOSIS — Z6833 Body mass index (BMI) 33.0-33.9, adult: Secondary | ICD-10-CM | POA: Diagnosis not present

## 2015-12-29 DIAGNOSIS — I200 Unstable angina: Secondary | ICD-10-CM | POA: Diagnosis present

## 2015-12-29 DIAGNOSIS — E669 Obesity, unspecified: Secondary | ICD-10-CM | POA: Diagnosis present

## 2015-12-29 DIAGNOSIS — R600 Localized edema: Secondary | ICD-10-CM

## 2015-12-29 DIAGNOSIS — I110 Hypertensive heart disease with heart failure: Secondary | ICD-10-CM | POA: Diagnosis present

## 2015-12-29 DIAGNOSIS — I480 Paroxysmal atrial fibrillation: Secondary | ICD-10-CM | POA: Diagnosis not present

## 2015-12-29 DIAGNOSIS — E7800 Pure hypercholesterolemia, unspecified: Secondary | ICD-10-CM | POA: Diagnosis present

## 2015-12-29 DIAGNOSIS — M7989 Other specified soft tissue disorders: Secondary | ICD-10-CM | POA: Diagnosis not present

## 2015-12-29 DIAGNOSIS — Z888 Allergy status to other drugs, medicaments and biological substances status: Secondary | ICD-10-CM

## 2015-12-29 DIAGNOSIS — I5033 Acute on chronic diastolic (congestive) heart failure: Secondary | ICD-10-CM | POA: Diagnosis present

## 2015-12-29 DIAGNOSIS — Z87891 Personal history of nicotine dependence: Secondary | ICD-10-CM | POA: Diagnosis not present

## 2015-12-29 DIAGNOSIS — I252 Old myocardial infarction: Secondary | ICD-10-CM

## 2015-12-29 DIAGNOSIS — Z885 Allergy status to narcotic agent status: Secondary | ICD-10-CM | POA: Diagnosis not present

## 2015-12-29 DIAGNOSIS — E785 Hyperlipidemia, unspecified: Secondary | ICD-10-CM | POA: Diagnosis not present

## 2015-12-29 DIAGNOSIS — I25119 Atherosclerotic heart disease of native coronary artery with unspecified angina pectoris: Secondary | ICD-10-CM | POA: Diagnosis present

## 2015-12-29 DIAGNOSIS — Z955 Presence of coronary angioplasty implant and graft: Secondary | ICD-10-CM

## 2015-12-29 DIAGNOSIS — Z91048 Other nonmedicinal substance allergy status: Secondary | ICD-10-CM | POA: Diagnosis not present

## 2015-12-29 DIAGNOSIS — I25110 Atherosclerotic heart disease of native coronary artery with unstable angina pectoris: Principal | ICD-10-CM | POA: Diagnosis present

## 2015-12-29 DIAGNOSIS — I2510 Atherosclerotic heart disease of native coronary artery without angina pectoris: Secondary | ICD-10-CM | POA: Diagnosis not present

## 2015-12-29 DIAGNOSIS — F419 Anxiety disorder, unspecified: Secondary | ICD-10-CM | POA: Diagnosis present

## 2015-12-29 DIAGNOSIS — K219 Gastro-esophageal reflux disease without esophagitis: Secondary | ICD-10-CM | POA: Diagnosis present

## 2015-12-29 DIAGNOSIS — I10 Essential (primary) hypertension: Secondary | ICD-10-CM | POA: Diagnosis present

## 2015-12-29 DIAGNOSIS — R0600 Dyspnea, unspecified: Secondary | ICD-10-CM

## 2015-12-29 DIAGNOSIS — Z7982 Long term (current) use of aspirin: Secondary | ICD-10-CM | POA: Diagnosis not present

## 2015-12-29 DIAGNOSIS — Z8711 Personal history of peptic ulcer disease: Secondary | ICD-10-CM | POA: Diagnosis not present

## 2015-12-29 DIAGNOSIS — M1990 Unspecified osteoarthritis, unspecified site: Secondary | ICD-10-CM | POA: Diagnosis present

## 2015-12-29 DIAGNOSIS — R609 Edema, unspecified: Secondary | ICD-10-CM

## 2015-12-29 DIAGNOSIS — Z7951 Long term (current) use of inhaled steroids: Secondary | ICD-10-CM

## 2015-12-29 DIAGNOSIS — Z7902 Long term (current) use of antithrombotics/antiplatelets: Secondary | ICD-10-CM

## 2015-12-29 LAB — BASIC METABOLIC PANEL
Anion gap: 13 (ref 5–15)
BUN: 22 mg/dL — AB (ref 6–20)
CHLORIDE: 102 mmol/L (ref 101–111)
CO2: 26 mmol/L (ref 22–32)
Calcium: 9.3 mg/dL (ref 8.9–10.3)
Creatinine, Ser: 1.22 mg/dL — ABNORMAL HIGH (ref 0.44–1.00)
GFR calc Af Amer: 48 mL/min — ABNORMAL LOW (ref >60)
GFR calc non Af Amer: 42 mL/min — ABNORMAL LOW (ref >60)
Glucose, Bld: 95 mg/dL (ref 65–99)
POTASSIUM: 3.9 mmol/L (ref 3.5–5.1)
SODIUM: 141 mmol/L (ref 135–145)

## 2015-12-29 LAB — CBC
HEMATOCRIT: 38.8 % (ref 36.0–46.0)
Hemoglobin: 12.3 g/dL (ref 12.0–15.0)
MCH: 26.7 pg (ref 26.0–34.0)
MCHC: 31.7 g/dL (ref 30.0–36.0)
MCV: 84.3 fL (ref 78.0–100.0)
Platelets: 213 10*3/uL (ref 150–400)
RBC: 4.6 MIL/uL (ref 3.87–5.11)
RDW: 15 % (ref 11.5–15.5)
WBC: 7 10*3/uL (ref 4.0–10.5)

## 2015-12-29 LAB — D-DIMER, QUANTITATIVE (NOT AT ARMC): D DIMER QUANT: 0.3 ug{FEU}/mL (ref 0.00–0.50)

## 2015-12-29 LAB — TROPONIN I
Troponin I: <0.03 ng/mL (ref <0.031)
Troponin I: <0.03 ng/mL (ref <0.031)

## 2015-12-29 LAB — BRAIN NATRIURETIC PEPTIDE: B NATRIURETIC PEPTIDE 5: 30.6 pg/mL (ref 0.0–100.0)

## 2015-12-29 LAB — MAGNESIUM: MAGNESIUM: 2.2 mg/dL (ref 1.7–2.4)

## 2015-12-29 MED ORDER — FUROSEMIDE 10 MG/ML IJ SOLN
40.0000 mg | Freq: Two times a day (BID) | INTRAMUSCULAR | Status: DC
  Administered 2015-12-29 – 2016-01-02 (×8): 40 mg via INTRAVENOUS
  Filled 2015-12-29 (×8): qty 4

## 2015-12-29 MED ORDER — CLOPIDOGREL BISULFATE 75 MG PO TABS
75.0000 mg | ORAL_TABLET | Freq: Every day | ORAL | Status: DC
  Administered 2015-12-30 – 2016-01-02 (×3): 75 mg via ORAL
  Filled 2015-12-29 (×2): qty 1

## 2015-12-29 MED ORDER — ASPIRIN EC 81 MG PO TBEC
81.0000 mg | DELAYED_RELEASE_TABLET | Freq: Every day | ORAL | Status: DC
Start: 2015-12-30 — End: 2016-01-02
  Administered 2015-12-30 – 2016-01-02 (×3): 81 mg via ORAL
  Filled 2015-12-29 (×3): qty 1

## 2015-12-29 MED ORDER — ALBUTEROL SULFATE (2.5 MG/3ML) 0.083% IN NEBU: 3.0000 mL | INHALATION_SOLUTION | Freq: Four times a day (QID) | RESPIRATORY_TRACT | Status: DC | PRN

## 2015-12-29 MED ORDER — ALPRAZOLAM 0.25 MG PO TABS
0.2500 mg | ORAL_TABLET | Freq: Two times a day (BID) | ORAL | Status: DC
  Administered 2015-12-29 – 2016-01-02 (×8): 0.25 mg via ORAL
  Filled 2015-12-29 (×8): qty 1

## 2015-12-29 MED ORDER — FLUTICASONE PROPIONATE 50 MCG/ACT NA SUSP
2.0000 | Freq: Every day | NASAL | Status: DC
  Filled 2015-12-29: qty 16

## 2015-12-29 MED ORDER — CYCLOBENZAPRINE HCL 10 MG PO TABS
5.0000 mg | ORAL_TABLET | Freq: Three times a day (TID) | ORAL | Status: DC | PRN
  Administered 2015-12-29: 5 mg via ORAL
  Filled 2015-12-29: qty 1

## 2015-12-29 MED ORDER — HEPARIN BOLUS VIA INFUSION
4000.0000 [IU] | Freq: Once | INTRAVENOUS | Status: AC
  Administered 2015-12-29: 4000 [IU] via INTRAVENOUS
  Filled 2015-12-29: qty 4000

## 2015-12-29 MED ORDER — IRBESARTAN 75 MG PO TABS
150.0000 mg | ORAL_TABLET | Freq: Every day | ORAL | Status: DC
  Administered 2015-12-30 – 2016-01-02 (×4): 150 mg via ORAL
  Filled 2015-12-29: qty 2
  Filled 2015-12-29 (×2): qty 1
  Filled 2015-12-29: qty 2

## 2015-12-29 MED ORDER — FUROSEMIDE 10 MG/ML IJ SOLN: 40.0000 mg | Freq: Two times a day (BID) | INTRAMUSCULAR | Status: DC

## 2015-12-29 MED ORDER — VALSARTAN-HYDROCHLOROTHIAZIDE 160-12.5 MG PO TABS: 1.0000 | ORAL_TABLET | Freq: Every day | ORAL | Status: DC

## 2015-12-29 MED ORDER — ONDANSETRON HCL 4 MG/2ML IJ SOLN
4.0000 mg | Freq: Four times a day (QID) | INTRAMUSCULAR | Status: DC | PRN
  Administered 2016-01-01: 17:00:00 4 mg via INTRAVENOUS
  Filled 2015-12-29: qty 2

## 2015-12-29 MED ORDER — DOCUSATE SODIUM 100 MG PO CAPS: 100.0000 mg | ORAL_CAPSULE | Freq: Two times a day (BID) | ORAL | Status: DC | PRN

## 2015-12-29 MED ORDER — METOPROLOL TARTRATE 12.5 MG HALF TABLET: 12.5000 mg | ORAL_TABLET | Freq: Two times a day (BID) | ORAL | Status: DC

## 2015-12-29 MED ORDER — NITROGLYCERIN 0.4 MG SL SUBL
0.4000 mg | SUBLINGUAL_TABLET | SUBLINGUAL | Status: DC | PRN
  Filled 2015-12-29: qty 1

## 2015-12-29 MED ORDER — ZOLPIDEM TARTRATE 5 MG PO TABS: 5.0000 mg | ORAL_TABLET | Freq: Every day | ORAL | Status: DC | PRN

## 2015-12-29 MED ORDER — POTASSIUM CHLORIDE CRYS ER 20 MEQ PO TBCR
30.0000 meq | EXTENDED_RELEASE_TABLET | Freq: Every day | ORAL | Status: DC
  Administered 2015-12-30 – 2016-01-02 (×4): 30 meq via ORAL
  Filled 2015-12-29 (×5): qty 1

## 2015-12-29 MED ORDER — METOPROLOL TARTRATE 12.5 MG HALF TABLET
12.5000 mg | ORAL_TABLET | Freq: Two times a day (BID) | ORAL | Status: DC
  Administered 2015-12-29 – 2016-01-02 (×8): 12.5 mg via ORAL
  Filled 2015-12-29 (×9): qty 1

## 2015-12-29 MED ORDER — ACETAMINOPHEN 325 MG PO TABS: 650.0000 mg | ORAL_TABLET | ORAL | Status: DC | PRN

## 2015-12-29 MED ORDER — ASPIRIN EC 81 MG PO TBEC: 81.0000 mg | DELAYED_RELEASE_TABLET | Freq: Every day | ORAL | Status: DC

## 2015-12-29 MED ORDER — LORATADINE 10 MG PO TABS
10.0000 mg | ORAL_TABLET | Freq: Every day | ORAL | Status: DC
  Administered 2015-12-30 – 2016-01-02 (×4): 10 mg via ORAL
  Filled 2015-12-29 (×4): qty 1

## 2015-12-29 MED ORDER — HYDROCHLOROTHIAZIDE 12.5 MG PO CAPS
12.5000 mg | ORAL_CAPSULE | Freq: Every day | ORAL | Status: DC
  Administered 2015-12-30 – 2016-01-02 (×4): 12.5 mg via ORAL
  Filled 2015-12-29 (×5): qty 1

## 2015-12-29 MED ORDER — HYDROCODONE-ACETAMINOPHEN 5-325 MG PO TABS
0.5000 | ORAL_TABLET | Freq: Four times a day (QID) | ORAL | Status: DC | PRN
  Administered 2015-12-29 – 2015-12-31 (×6): 1 via ORAL
  Filled 2015-12-29 (×7): qty 1

## 2015-12-29 MED ORDER — HEPARIN (PORCINE) IN NACL 100-0.45 UNIT/ML-% IJ SOLN
1150.0000 [IU]/h | INTRAMUSCULAR | Status: DC
  Administered 2015-12-29 (×2): 1150 [IU]/h via INTRAVENOUS
  Filled 2015-12-29 (×2): qty 250

## 2015-12-29 MED ORDER — PREGABALIN 25 MG PO CAPS
75.0000 mg | ORAL_CAPSULE | Freq: Two times a day (BID) | ORAL | Status: DC
  Administered 2015-12-29 – 2016-01-02 (×8): 75 mg via ORAL
  Filled 2015-12-29: qty 1
  Filled 2015-12-29: qty 3
  Filled 2015-12-29 (×2): qty 1
  Filled 2015-12-29: qty 3
  Filled 2015-12-29: qty 1
  Filled 2015-12-29: qty 3
  Filled 2015-12-29: qty 1

## 2015-12-29 NOTE — Progress Notes (Signed)
ANTICOAGULATION CONSULT NOTE - Initial Consult  Pharmacy Consult:  Heparin Indication: chest pain/ACS  Allergies  Allergen Reactions  . Adhesive [Tape] Itching and Rash  . Codeine Itching    Tolerates low dose of norco  . Elavil [Amitriptyline] Other (See Comments)    Dissociation    Patient Measurements: Height: 5\' 7"  (170.2 cm) Weight: 218 lb 8 oz (99.111 kg) IBW/kg (Calculated) : 61.6 Heparin Dosing Weight: 84 kg  Vital Signs: Temp: 97.9 F (36.6 C) (04/28 1218) Temp Source: Oral (04/28 1218) BP: 113/80 mmHg (04/28 1218) Pulse Rate: 77 (04/28 1218)  Labs: No results for input(s): HGB, HCT, PLT, APTT, LABPROT, INR, HEPARINUNFRC, HEPRLOWMOCWT, CREATININE, CKTOTAL, CKMB, TROPONINI in the last 72 hours.  CrCl cannot be calculated (Patient has no serum creatinine result on file.).   Medical History: Past Medical History  Diagnosis Date  . Coronary artery disease   . Hypertension   . Anxiety   . Fibromyalgia   . Dysrhythmia     Palpitations, possible atrial fibrillation  . Vertigo 08/27/2006  . Ventricular hypertrophy   . High cholesterol   . Anginal pain (HCC)   . Exertional shortness of breath   . History of stomach ulcers     Remotely  . GERD (gastroesophageal reflux disease)   . Arthritis     Multiple joints  . Osteoporosis   . Acute myocardial infarction, unspecified site, initial episode of care   . Acute myocardial infarction of other lateral wall, initial episode of care      Assessment: [age] YOM with recent cath with PCI admitted directly from his PCP's office due to concerns of in-stent restenosis.  Pharmacy consulted to initiate IV heparin for r/o ACS.  Baseline labs pending collection.  Patient is off the floor but husband reports she is not a blood thinner PTA.   Goal of Therapy:  Heparin level 0.3-0.7 units/ml Monitor platelets by anticoagulation protocol: Yes    Plan:  - Once labs are obtained, start - Heparin 4000 units IV bolus x 1,  then - Heparin gtt at 1150 units/hr - Check 8 hr HL - Daily HL / CBC   Anita Mcadory D. Laney Potashang, PharmD, BCPS Pager:  [phone] 12/29/2015, 2:11 PM

## 2015-12-29 NOTE — H&P (Signed)
History and Physical    Date: 12/29/2015   ID: [name], DOB Feb 15, 1936, MRN [id number]  PCP: [name]., MD Cardiologist: Lesleigh Noe, MD   Chief Complaint  Patient presents with  . Coronary Artery Disease     History of Present Illness: [name] is a 80 y.o. female who presents for follow-up of coronary artery disease with prior LAD and circumflex drug-eluting stents, hypertension, chronic diastolic heart failure, paroxysmal atrial fibrillation, and hyperlipidemia. Also on chronic anticoagulation therapy.  Having dyspnea and fatigue. Left leg more swollen than the right. Bilateral neck pain needing NTG for relief.. She is having intermittent neck and chest pressure during the office visit. She has difficulty with orthopnea. The symptoms have been ongoing for the past 10 days. She has known to have restenosis and the LAD stent treated with scoring balloon angioplasty and restenting slightly greater than a year ago using a Resolute DES. At that time there was other residual native coronary disease, please see below. She had a difficult time getting to the [facility] heart care office today because of dyspnea and discomfort.    Last cath 11/2014: Summary Three-vessel coronary obstructive disease with 75% diffuse in-stent restenosis in the mid LAD stent (Cypher 2.515 mm, placed in 2006); 60-70% ostial stenosis of the left circumflex coronary artery with a widely patent stent in the circumflex vessel; and 40-50% mid RCA stenosis.  Physiologically significant fractional flow reserve the mid LAD in-stent restenosis at 0.75.  Successful PCI with Angiosculpt scoring balloon, and ultimate DES stenting with a 2.7518 mm Resolute integrity stent postdilated to 3.05 mm with the 75% stenosis being reduced to 0%.   Past Medical History  Diagnosis Date  . Coronary artery disease   . Hypertension   . Anxiety   .  Fibromyalgia   . Dysrhythmia     Palpitations, possible atrial fibrillation  . Vertigo 08/27/2006  . Ventricular hypertrophy   . High cholesterol   . Anginal pain (HCC)   . Exertional shortness of breath   . History of stomach ulcers     Remotely  . GERD (gastroesophageal reflux disease)   . Arthritis     Multiple joints  . Osteoporosis   . Acute myocardial infarction, unspecified site, initial episode of care   . Acute myocardial infarction of other lateral wall, initial episode of care     Past Surgical History  Procedure Laterality Date  . Hernia repair    . Laparotomy      "day after oophorectomy; had bowel obstruction" (01/27/2013)  . Coronary angioplasty with stent placement  06/2005; 01/27/2013    2.515 mm Cypher stent to LAD in 2006; 2.75 x 12 Promus DES to the circumflex 2014   . Cardiac catheterization  10/2006    [name] 08/03/2007 (01/27/2013)  . Tonsillectomy and adenoidectomy  ~ 1952  . Abdominal hysterectomy  1970's  . Laparoscopic salpingo oopherectomy  2000  . Diagnostic laparoscopy  2000    lap abdominal lysis of adhesions  . Breast lumpectomy Right 1970's    benign   . Breast biopsy Right 1970's  . Left heart catheterization with coronary angiogram N/A 01/27/2013    Procedure: LEFT HEART CATHETERIZATION WITH CORONARY ANGIOGRAM; Surgeon: Corky Crafts, MD; Location: [facility] CATH LAB; Service: Cardiovascular; Laterality: N/A;  . Left heart catheterization with coronary angiogram N/A 11/10/2014    Procedure: LEFT HEART CATHETERIZATION WITH CORONARY ANGIOGRAM; Surgeon: Lennette Bihari, MD; left main okay, LAD 75% ISR, FFR 0.75, s/p  Angiosculpt scoring balloon and 2.7518 mm Resolute DES, CFX 70%, stent patent, RCA 50%  . Esophagogastroduodenoscopy N/A 11/10/2014    Procedure: ESOPHAGOGASTRODUODENOSCOPY (EGD); Surgeon: Jeani Hawking, MD;  Location: [facility] ENDOSCOPY; Service: Endoscopy; Laterality: N/A;       Medication List       This list is accurate as of: 12/29/15 11:58 AM. Always use your most recent med list.              acetaminophen 500 MG tablet  Commonly known as: TYLENOL  Take 500 mg by mouth daily as needed (pain).     albuterol 108 (90 Base) MCG/ACT inhaler  Commonly known as: PROVENTIL HFA;VENTOLIN HFA  Inhale 2 puffs into the lungs every 6 (six) hours as needed for wheezing.     ALPRAZolam 0.25 MG tablet  Commonly known as: XANAX  Take 0.25 mg by mouth 2 (two) times daily. anxiety     aspirin EC 81 MG tablet  Take 81 mg by mouth daily.     clopidogrel 75 MG tablet  Commonly known as: PLAVIX  Take 1 tablet (75 mg total) by mouth daily.     CoQ-10 100 MG Caps  Take 100 mg by mouth daily.     docusate sodium 100 MG capsule  Commonly known as: COLACE  Take 1 capsule (100 mg total) by mouth 2 (two) times daily as needed for mild constipation.     fluticasone 50 MCG/ACT nasal spray  Commonly known as: FLONASE  Place 2 sprays into both nostrils daily.     furosemide 20 MG tablet  Commonly known as: LASIX  Take 10 mg by mouth daily.     HYDROcodone-acetaminophen 5-325 MG tablet  Commonly known as: NORCO/VICODIN  Take 0.5-1 tablets by mouth every 6 (six) hours as needed for moderate pain.     lidocaine 5 %  Commonly known as: LIDODERM  Place 1 patch onto the skin daily as needed (pain).     loratadine 10 MG tablet  Commonly known as: CLARITIN  Take 10 mg by mouth daily.     LYRICA 75 MG capsule  Generic drug: pregabalin  Take 75 mg by mouth 2 (two) times daily.     metoprolol tartrate 25 MG tablet  Commonly known as: LOPRESSOR  Take 0.5 tablets (12.5 mg total) by mouth 2 (two) times daily.     nitroGLYCERIN 0.4 MG SL tablet  Commonly known as: NITROSTAT  Place 1 tablet (0.4 mg  total) under the tongue every 5 (five) minutes as needed for chest pain (CP or SOB).     Pitavastatin Calcium 2 MG Tabs  Commonly known as: LIVALO  Take 1 tablet (2 mg total) by mouth daily.     potassium chloride SA 20 MEQ tablet  Commonly known as: K-DUR,KLOR-CON  Take 1.5 tablets (30 mEq total) by mouth daily.     PREMARIN vaginal cream  Generic drug: conjugated estrogens  Place 1 Applicatorful vaginally daily as needed (dryness). As needed.     TYLENOL PM EXTRA STRENGTH PO  Take 1 tablet by mouth at bedtime as needed.     valsartan-hydrochlorothiazide 160-12.5 MG tablet  Commonly known as: DIOVAN-HCT  Take 1 tablet by mouth daily.     Vitamin D (Ergocalciferol) 50000 units Caps capsule  Commonly known as: DRISDOL  Take 50,000 Units by mouth every 7 (seven) days.     zolpidem 10 MG tablet  Commonly known as: AMBIEN  Take 5 mg by mouth daily as needed for  sleep.        Allergies: Adhesive; Codeine; and Elavil    Social History: The patient  reports that she quit smoking about 24 years ago. Her smoking use included Cigarettes. She has a 4.32 pack-year smoking history. She has never used smokeless tobacco. She reports that she does not drink alcohol or use illicit drugs.   Family History: The patient's family history includes Other in her father and mother.    ROS: Please see the history of present illness. Otherwise, review of systems are positive for Difficulty sleeping, orthopnea, lower extremity edema left greater than right, and fatigue.. All other systems are reviewed and negative.    PHYSICAL EXAM: VS: BP 110/80 mmHg  Pulse 75  Ht 5\' 7"  (1.702 m)  Wt 221 lb 6.4 oz (100.426 kg)  BMI 34.67 kg/m2 , BMI Body mass index is 34.67 kg/(m^2). GEN: Well nourished, well developed, in no acute distress  HEENT: normal  Neck: no JVD, carotid bruits, or masses Cardiac: RRR. There is an S4 gallop, no murmur, no rub.  There is left lower greater than right lower extremity edema. Respiratory: clear to auscultation bilaterally, normal work of breathing. GI: soft, nontender, nondistended, + BS MS: no deformity or atrophy  Skin: warm and dry, no rash Neuro: Strength and sensation are intact Psych: euthymic mood, full affect   EKG: EKG is ordered today. The ekg reveals sinus rhythm with premature atrial contractions, early RS transition, small inferior Q waves.   Recent Labs: 06/07/2015: BUN 13; Creat 0.91; Potassium 3.9; Sodium 140    Lipid Panel  Labs (Brief)       Component Value Date/Time   CHOL * 04/18/2009 0500    271  ATP III CLASSIFICATION: <200 mg/dL Desirable 161-096200-239 mg/dL Borderline High >=045>=240 mg/dL High     TRIG 409113 81/19/147808/17/2010 0500   HDL 42 04/18/2009 0500   CHOLHDL 6.5 04/18/2009 0500   VLDL 23 04/18/2009 0500   LDLCALC * 04/18/2009 0500    206  Total Cholesterol/HDL:CHD Risk Coronary Heart Disease Risk Table  Men Women 1/2 Average Risk 3.4 3.3 Average Risk 5.0 4.4 2 X Average Risk 9.6 7.1 3 X Average Risk 23.4 11.0   Use the calculated Patient Ratio above and the CHD Risk Table to determine the patient's CHD Risk.   ATP III CLASSIFICATION (LDL): <100 mg/dL Optimal 295-621100-129 mg/dL Near or Above  Optimal 130-159 mg/dL Borderline 308-657160-189 mg/dL High >846>190 mg/dL Very High       Wt Readings from Last 3 Encounters:  12/29/15 218 lb 8 oz (99.111 kg)  12/29/15 221 lb 6.4 oz (100.426 kg)  05/03/15 217 lb (98.431 kg)      Other studies Reviewed: Additional studies/ records that were reviewed today include: Reviewed prior cath. The findings include identified diffuse in-stent restenosis in the LAD treated with restenting slightly greater than one year ago..    ASSESSMENT AND PLAN:  1.  Coronary artery disease involving native coronary artery of native heart without angina pectoris Probable unstable angina and needs to have re-cath considering known in-stent restenosis of LAD.  2. HTN (hypertension), malignant Controlled  3. Acute on chronic diastolic heart failure with Edema of left lower extremity Admit for diuresis and stabilization of heart failure to be followed by catheterization early next week. With left lower extremity greater than right lower extremity leg swelling, rule out pulmonary embolism.  4. Paroxysmal atrial fibrillation (HCC) Currently in sinus rhythm  5. Hyperlipidemia On therapy    Current medicines  are reviewed at length with the patient today. The patient has the following concerns regarding medicines: None.  The following changes/actions have been instituted:   Admission with evaluation of the possibility of acute diastolic heart failure, rule out pulmonary emboli.  After dyspnea is improved, will need coronary angiography to exclude restenosis of LAD stent and or progression of other disease. Anticipate Monday.  Discussed with patient and husband who agree with plan.  Labs/ tests ordered today include:  Orders Placed This Encounter  Procedures  . EKG 12-Lead     Disposition: FU with HS in PRN after admission and discharge for above problems.  Signed, Lesleigh Noe, MD  12/29/2015 1:19 PM  [facility] [address] [place], [place], [place] [address] Phone: [phone]; Fax: [phone]

## 2015-12-29 NOTE — Progress Notes (Signed)
Pt c/o cramping on Rt. Hand where iv heparin turned off.  Encourage pt to flex fingers and stated it felt better.  Warm compress applied.  Mardelle MatteAndy, pharmacist notified and L. Elwinngold [place].  PA instructed to placed new iv and restart it on new site.  Amanda PeaNellie Nealie Mchatton, Charity fundraiserN.

## 2015-12-29 NOTE — Progress Notes (Signed)
At 1315 introduced self to pt as incoming nurse until 7p.  Call bell at reach.  Instructed to call as needed.  Family member at bedside.  Will continue to monitor.  Amanda PeaNellie Kirston Luty, Charity fundraiserN.

## 2015-12-29 NOTE — Patient Instructions (Addendum)
Medication Instructions:  Your physician recommends that you continue on your current medications as directed. Please refer to the Current Medication list given to you today.  Labwork: None  ordered  Testing/Procedures: None ordered  Follow-Up: Your physician wants you to follow-up in: 1 year with Dr. Katrinka BlazingSmith. You will receive a reminder letter in the mail two months in advance. If you don't receive a letter, please call our office to schedule the follow-up appointment.   Any Other Special Instructions Will Be Listed Below (If Applicable). - Dr. Katrinka BlazingSmith is admitting you to the hospital for heart failure - the hospital will call you when a bed is available.  If you need a refill on your cardiac medications before your next appointment, please call your pharmacy.  Thank you for choosing [facility]!!

## 2015-12-29 NOTE — Progress Notes (Signed)
Pt c/o cramping to bilateral lower extremities.  Pt states "Is not new, but usually on one leg".  Pillow provided and encourage pt to elevate her legs.  Also notified L. Hamptonngold, [place] and ordered labs for K+ and mag level.  Oncoming nurse made aware.  Amanda PeaNellie Mattalyn Anderegg, Charity fundraiserN.

## 2015-12-29 NOTE — Progress Notes (Signed)
Unable to give pt med that's order at this time as pharmacy said they have to do med history.  Will continue to monitor.  Amanda PeaNellie Jermani Pund, Charity fundraiserN.

## 2015-12-29 NOTE — Progress Notes (Signed)
[name] was admitted to room 3E10 as a direct admit from Doctor's office visit this morning.  Placed on telemetry and CCMD checks completed.  On telemetry box 14.  Patient alert and oriented.  Will page for admission orders.

## 2015-12-29 NOTE — Progress Notes (Signed)
History and Physical    Date:  12/29/2015   ID:  [name], DOB [birth year], MRN [id number]  PCP:  [name]., MD  Cardiologist:  Lesleigh Noe, MD   Chief Complaint  Patient presents with  . Coronary Artery Disease      History of Present Illness: [name] is a 80 y.o. female who presents for follow-up of coronary artery disease with prior LAD and circumflex drug-eluting stents, hypertension, chronic diastolic heart failure, paroxysmal atrial fibrillation, and hyperlipidemia. Also on chronic anticoagulation therapy.  Having dyspnea and fatigue. Left leg more swollen than the right. Bilateral neck pain needing NTG for relief.. She is having intermittent neck and chest pressure during the office visit. She has difficulty with orthopnea. The symptoms have been ongoing for the past 10 days. She has known to have restenosis and the LAD stent treated with scoring balloon angioplasty and restenting slightly greater than a year ago using a Resolute DES. At that time there was other residual native coronary disease, please see below. She had a difficult time getting to the [facility] heart care office today because of dyspnea and discomfort.    Last cath 11/2014: Summary Three-vessel coronary obstructive disease with 75% diffuse in-stent restenosis in the mid LAD stent (Cypher 2.515 mm, placed in 2006); 60-70% ostial stenosis of the left circumflex coronary artery with a widely patent stent in the circumflex vessel; and 40-50% mid RCA stenosis.  Physiologically significant fractional flow reserve the mid LAD in-stent restenosis at 0.75.  Successful PCI with Angiosculpt scoring balloon, and ultimate DES stenting with a 2.7518 mm Resolute integrity stent postdilated to 3.05 mm with the 75% stenosis being reduced to 0%.   Past Medical History  Diagnosis Date  . Coronary artery disease   . Hypertension   . Anxiety   . Fibromyalgia   . Dysrhythmia    Palpitations, possible atrial fibrillation  . Vertigo 08/27/2006  . Ventricular hypertrophy   . High cholesterol   . Anginal pain (HCC)   . Exertional shortness of breath   . History of stomach ulcers     Remotely  . GERD (gastroesophageal reflux disease)   . Arthritis     Multiple joints  . Osteoporosis   . Acute myocardial infarction, unspecified site, initial episode of care   . Acute myocardial infarction of other lateral wall, initial episode of care     Past Surgical History  Procedure Laterality Date  . Hernia repair    . Laparotomy      "day after oophorectomy; had bowel obstruction" (01/27/2013)  . Coronary angioplasty with stent placement  06/2005; 01/27/2013     2.515 mm Cypher stent to LAD in 2006; 2.75 x 12 Promus DES to the circumflex 2014    . Cardiac catheterization  10/2006    [name] 08/03/2007 (01/27/2013)  . Tonsillectomy and adenoidectomy  ~ 1952  . Abdominal hysterectomy  1970's  . Laparoscopic salpingo oopherectomy  2000  . Diagnostic laparoscopy  2000    lap abdominal lysis of adhesions  . Breast lumpectomy Right 1970's    benign   . Breast biopsy Right 1970's  . Left heart catheterization with coronary angiogram N/A 01/27/2013    Procedure: LEFT HEART CATHETERIZATION WITH CORONARY ANGIOGRAM;  Surgeon: Corky Crafts, MD;  Location: [facility] CATH LAB;  Service: Cardiovascular;  Laterality: N/A;  . Left heart catheterization with coronary angiogram N/A 11/10/2014    Procedure: LEFT HEART CATHETERIZATION WITH CORONARY ANGIOGRAM;  Surgeon: Maisie Fus  Alphonsus SiasA Kelly, MD; left main okay, LAD 75% ISR, FFR 0.75, s/p Angiosculpt scoring balloon and 2.7518 mm Resolute DES, CFX 70%, stent patent, RCA 50%  . Esophagogastroduodenoscopy N/A 11/10/2014    Procedure: ESOPHAGOGASTRODUODENOSCOPY (EGD);  Surgeon: Jeani HawkingPatrick Hung, MD;  [facility] END[facility];  Service: Endoscopy;  Laterality: N/A;       Medication List       This list is accurate as of: 12/29/15 11:58 AM.  Always use your  most recent med list.               acetaminophen 500 MG tablet  Commonly known as:  TYLENOL  Take 500 mg by mouth daily as needed (pain).     albuterol 108 (90 Base) MCG/ACT inhaler  Commonly known as:  PROVENTIL HFA;VENTOLIN HFA  Inhale 2 puffs into the lungs every 6 (six) hours as needed for wheezing.     ALPRAZolam 0.25 MG tablet  Commonly known as:  XANAX  Take 0.25 mg by mouth 2 (two) times daily. anxiety     aspirin EC 81 MG tablet  Take 81 mg by mouth daily.     clopidogrel 75 MG tablet  Commonly known as:  PLAVIX  Take 1 tablet (75 mg total) by mouth daily.     CoQ-10 100 MG Caps  Take 100 mg by mouth daily.     docusate sodium 100 MG capsule  Commonly known as:  COLACE  Take 1 capsule (100 mg total) by mouth 2 (two) times daily as needed for mild constipation.     fluticasone 50 MCG/ACT nasal spray  Commonly known as:  FLONASE  Place 2 sprays into both nostrils daily.     furosemide 20 MG tablet  Commonly known as:  LASIX  Take 10 mg by mouth daily.     HYDROcodone-acetaminophen 5-325 MG tablet  Commonly known as:  NORCO/VICODIN  Take 0.5-1 tablets by mouth every 6 (six) hours as needed for moderate pain.     lidocaine 5 %  Commonly known as:  LIDODERM  Place 1 patch onto the skin daily as needed (pain).     loratadine 10 MG tablet  Commonly known as:  CLARITIN  Take 10 mg by mouth daily.     LYRICA 75 MG capsule  Generic drug:  pregabalin  Take 75 mg by mouth 2 (two) times daily.     metoprolol tartrate 25 MG tablet  Commonly known as:  LOPRESSOR  Take 0.5 tablets (12.5 mg total) by mouth 2 (two) times daily.     nitroGLYCERIN 0.4 MG SL tablet  Commonly known as:  NITROSTAT  Place 1 tablet (0.4 mg total) under the tongue every 5 (five) minutes as needed for chest pain (CP or SOB).     Pitavastatin Calcium 2 MG Tabs  Commonly known as:  LIVALO  Take 1 tablet (2 mg total) by mouth daily.     potassium chloride SA 20 MEQ tablet  Commonly  known as:  K-DUR,KLOR-CON  Take 1.5 tablets (30 mEq total) by mouth daily.     PREMARIN vaginal cream  Generic drug:  conjugated estrogens  Place 1 Applicatorful vaginally daily as needed (dryness). As needed.     TYLENOL PM EXTRA STRENGTH PO  Take 1 tablet by mouth at bedtime as needed.     valsartan-hydrochlorothiazide 160-12.5 MG tablet  Commonly known as:  DIOVAN-HCT  Take 1 tablet by mouth daily.     Vitamin D (Ergocalciferol) 50000 units Caps capsule  Commonly known  as:  DRISDOL  Take 50,000 Units by mouth every 7 (seven) days.     zolpidem 10 MG tablet  Commonly known as:  AMBIEN  Take 5 mg by mouth daily as needed for sleep.        Allergies:   Adhesive; Codeine; and Elavil    Social History:  The patient  reports that she quit smoking about 24 years ago. Her smoking use included Cigarettes. She has a 4.32 pack-year smoking history. She has never used smokeless tobacco. She reports that she does not drink alcohol or use illicit drugs.   Family History:  The patient's family history includes Other in her father and mother.    ROS:  Please see the history of present illness.   Otherwise, review of systems are positive for Difficulty sleeping, orthopnea, lower extremity edema left greater than right, and fatigue..   All other systems are reviewed and negative.    PHYSICAL EXAM: VS:  BP 110/80 mmHg  Pulse 75  Ht  (1.702 m)  Wt 221 lb 6.4 oz (100.426 kg)  BMI 34.67 kg/m2 , BMI Body mass index is 34.67 kg/(m^2). GEN: Well nourished, well developed, in no acute distress HEENT: normal Neck: no JVD, carotid bruits, or masses Cardiac: RRR.  There is an S4 gallop, no murmur, no rub. There is left lower greater than right lower extremity edema. Respiratory:  clear to auscultation bilaterally, normal work of breathing. GI: soft, nontender, nondistended, + BS MS: no deformity or atrophy Skin: warm and dry, no rash Neuro:  Strength and sensation are intact Psych:  euthymic mood, full affect   EKG:  EKG is ordered today. The ekg reveals sinus rhythm with premature atrial contractions, early RS transition, small inferior Q waves.   Recent Labs: 06/07/2015: BUN 13; Creat 0.91; Potassium 3.9; Sodium 140    Lipid Panel    Component Value Date/Time   CHOL * 04/18/2009 0500    271        ATP III CLASSIFICATION:  <200     mg/dL   Desirable  161-096  mg/dL   Borderline High  >=045    mg/dL   High          TRIG 409 04/18/2009 0500   HDL 42 04/18/2009 0500   CHOLHDL 6.5 04/18/2009 0500   VLDL 23 04/18/2009 0500   LDLCALC * 04/18/2009 0500    206        Total Cholesterol/HDL:CHD Risk Coronary Heart Disease Risk Table                     Men   Women  1/2 Average Risk   3.4   3.3  Average Risk       5.0   4.4  2 X Average Risk   9.6   7.1  3 X Average Risk  23.4   11.0        Use the calculated Patient Ratio above and the CHD Risk Table to determine the patient's CHD Risk.        ATP III CLASSIFICATION (LDL):  <100     mg/dL   Optimal  811-914  mg/dL   Near or Above                    Optimal  130-159  mg/dL   Borderline  782-956  mg/dL   High  >213     mg/dL   Very High  Wt Readings from Last 3 Encounters:  12/29/15 218 lb 8 oz (99.111 kg)  12/29/15 221 lb 6.4 oz (100.426 kg)  05/03/15 217 lb (98.431 kg)      Other studies Reviewed: Additional studies/ records that were reviewed today include: Reviewed prior cath. The findings include identified diffuse in-stent restenosis in the LAD treated with restenting slightly greater than one year ago..    ASSESSMENT AND PLAN:  1. Coronary artery disease involving native coronary artery of native heart without angina pectoris Probable unstable angina and needs to have re-cath considering known in-stent restenosis of LAD.  2. HTN (hypertension), malignant Controlled  3. Acute on chronic diastolic heart failure with Edema of left lower extremity Admit for diuresis and  stabilization of heart failure to be followed by catheterization early next week. With left lower extremity greater than right lower extremity leg swelling, rule out pulmonary embolism.  4. Paroxysmal atrial fibrillation (HCC) Currently in sinus rhythm  5. Hyperlipidemia On therapy    Current medicines are reviewed at length with the patient today.  The patient has the following concerns regarding medicines: None.  The following changes/actions have been instituted:    Admission with evaluation of the possibility of acute diastolic heart failure, rule out pulmonary emboli.  After dyspnea is improved, will need coronary angiography to exclude restenosis of LAD stent and or progression of other disease. Anticipate Monday.  Discussed with patient and husband who agree with plan.  Labs/ tests ordered today include:   Orders Placed This Encounter  Procedures  . EKG 12-Lead     Disposition:   FU with HS in  PRN after admission and discharge for above problems.  Signed, Lesleigh Noe, MD  12/29/2015 1:19 PM    [facility] [address] [place], [place], [place]  [address] Phone: [phone]; Fax: [phone]

## 2015-12-30 ENCOUNTER — Inpatient Hospital Stay (HOSPITAL_COMMUNITY): Payer: Medicare Other

## 2015-12-30 DIAGNOSIS — M7989 Other specified soft tissue disorders: Secondary | ICD-10-CM

## 2015-12-30 LAB — HEPARIN LEVEL (UNFRACTIONATED)
Heparin Unfractionated: 1.1 IU/mL — ABNORMAL HIGH (ref 0.30–0.70)
Heparin Unfractionated: 0.81 IU/mL — ABNORMAL HIGH (ref 0.30–0.70)

## 2015-12-30 LAB — BASIC METABOLIC PANEL
ANION GAP: 13 (ref 5–15)
BUN: 24 mg/dL — ABNORMAL HIGH (ref 6–20)
CALCIUM: 9.1 mg/dL (ref 8.9–10.3)
CO2: 23 mmol/L (ref 22–32)
CREATININE: 1.01 mg/dL — AB (ref 0.44–1.00)
Chloride: 104 mmol/L (ref 101–111)
GFR, EST NON AFRICAN AMERICAN: 52 mL/min — AB (ref >60)
GLUCOSE: 126 mg/dL — AB (ref 65–99)
Potassium: 4.6 mmol/L (ref 3.5–5.1)
Sodium: 140 mmol/L (ref 135–145)

## 2015-12-30 LAB — CBC
HCT: 41.4 % (ref 36.0–46.0)
HEMOGLOBIN: 13.2 g/dL (ref 12.0–15.0)
MCH: 27 pg (ref 26.0–34.0)
MCHC: 31.9 g/dL (ref 30.0–36.0)
MCV: 84.8 fL (ref 78.0–100.0)
PLATELETS: 218 10*3/uL (ref 150–400)
RBC: 4.88 MIL/uL (ref 3.87–5.11)
RDW: 15.1 % (ref 11.5–15.5)
WBC: 7.1 10*3/uL (ref 4.0–10.5)

## 2015-12-30 LAB — TROPONIN I

## 2015-12-30 LAB — LIPID PANEL
CHOLESTEROL: 271 mg/dL — AB (ref 0–200)
HDL: 49 mg/dL (ref >40)
LDL Cholesterol: 204 mg/dL — ABNORMAL HIGH (ref 0–99)
Total CHOL/HDL Ratio: 5.5 RATIO
Triglycerides: 90 mg/dL (ref <150)
VLDL: 18 mg/dL (ref 0–40)

## 2015-12-30 LAB — PROTIME-INR
INR: 1.11 (ref 0.00–1.49)
PROTHROMBIN TIME: 14.5 s (ref 11.6–15.2)

## 2015-12-30 MED ORDER — HEPARIN (PORCINE) IN NACL 100-0.45 UNIT/ML-% IJ SOLN
800.0000 [IU]/h | INTRAMUSCULAR | Status: DC
  Administered 2015-12-30 – 2015-12-31 (×2): 800 [IU]/h via INTRAVENOUS
  Filled 2015-12-30: qty 250

## 2015-12-30 NOTE — Progress Notes (Signed)
ANTICOAGULATION CONSULT NOTE - Initial Consult  Pharmacy Consult:  Heparin Indication: chest pain/ACS  Allergies  Allergen Reactions  . Adhesive [Tape] Itching and Rash  . Codeine Itching    Tolerates low dose of norco  . Elavil [Amitriptyline] Other (See Comments)    Dissociation    Patient Measurements: Height: 5\' 7"  (170.2 cm) Weight: 216 lb 3.2 oz (98.068 kg) IBW/kg (Calculated) : 61.6 Heparin Dosing Weight: 84 kg  Vital Signs: Temp: 98.2 F (36.8 C) (04/29 1223) Temp Source: Oral (04/29 1223) BP: 110/57 mmHg (04/29 1223) Pulse Rate: 62 (04/29 1223)  Labs:  Recent Labs  12/29/15 1423 12/29/15 1951 12/29/15 1953 12/30/15 12/30/15 0538 12/30/15 1608  HGB 12.3  --   --  13.2  --   --   HCT 38.8  --   --  41.4  --   --   PLT 213  --   --  218  --   --   LABPROT  --   --   --  14.5  --   --   INR  --   --   --  1.11  --   --   HEPARINUNFRC  --   --   --   --  1.10* 0.81*  CREATININE  --  1.22*  --  1.01*  --   --   TROPONINI <0.03  --  <0.03 <0.03  --   --     Estimated Creatinine Clearance: 56.1 mL/min (by C-G formula based on Cr of 1.01).   Assessment: [age] YOM with recent cath with PCI admitted directly from his PCP's office due to concerns of in-stent restenosis.  Pharmacy consulted to initiate IV heparin for r/o ACS.   -heparin level is 0.81 and above goal  Goal of Therapy:  Heparin level 0.3-0.7 units/ml Monitor platelets by anticoagulation protocol: Yes    Plan:  -Decrease heparin to 800 units/hr -Heparin level in 8 hours and daily wth CBC daily  Harland Germanndrew Cristian Davitt, Pharm D 12/30/2015 5:15 PM

## 2015-12-30 NOTE — Progress Notes (Signed)
Subjective:  Admitted out of the office yesterday with worsening chest pain and dyspnea.  Had some chest pain last night but is feeling better this morning.  Initial troponins are negative.  Objective:  Vital Signs in the last 24 hours: BP 135/64 mmHg  Pulse 60  Temp(Src) 98.1 F (36.7 C) (Oral)  Resp 20  Ht 5\' 7"  (1.702 m)  Wt 98.068 kg (216 lb 3.2 oz)  BMI 33.85 kg/m2  SpO2 97%  Physical Exam: Mildly obese elderly black female in no acute distress Lungs:  Clear Cardiac:  Regular rhythm, normal S1 and S2, no S3 Abdomen:  Soft, nontender, no masses Extremities:  No edema present  Intake/Output from previous day: 04/28 0701 - 04/29 0700 In: 507.4 [P.O.:480; I.V.:27.4] Out: 1800 [Urine:1800]  Weight Filed Weights   12/29/15 1218 12/30/15 0558  Weight: 99.111 kg (218 lb 8 oz) 98.068 kg (216 lb 3.2 oz)    Lab Results: Basic Metabolic Panel:  Recent Labs  82/95/6204/28/17 1951 12/30/15  NA 141 140  K 3.9 4.6  CL 102 104  CO2 26 23  GLUCOSE 95 126*  BUN 22* 24*  CREATININE 1.22* 1.01*   CBC:  Recent Labs  12/29/15 1423 12/30/15  WBC 7.0 7.1  HGB 12.3 13.2  HCT 38.8 41.4  MCV 84.3 84.8  PLT 213 218   Cardiac Panel (last 3 results)  Recent Labs  12/29/15 1423 12/29/15 1953 12/30/15  TROPONINI <0.03 <0.03 <0.03    Telemetry: Sinus rhythm  Assessment/Plan:   1.  Dyspnea and chest pain possibly consistent with unstable angina.  Her BNP level was 30 yesterday making heart failure less likely.  Troponins are negative. 2.  Coronary artery disease with previous stenting 3.  Chronic diastolic heart failure  Recommendations:  Awaiting catheterization on Monday.  In light of worsening symptoms of dyspnea repeat echo.  Check lower extremities for thrombus.      Darden PalmerW. Spencer Clarann Helvey, Jr.  MD [facility] Cardiology  12/30/2015, 8:51 AM

## 2015-12-30 NOTE — Progress Notes (Signed)
ANTICOAGULATION CONSULT NOTE - Follow Up Consult  Pharmacy Consult for Heparin  Indication: chest pain/ACS  Allergies  Allergen Reactions  . Adhesive [Tape] Itching and Rash  . Codeine Itching    Tolerates low dose of norco  . Elavil [Amitriptyline] Other (See Comments)    Dissociation   Patient Measurements: Height: 5\' 7"  (170.2 cm) Weight: 216 lb 3.2 oz (98.068 kg) IBW/kg (Calculated) : 61.6  Vital Signs: Temp: 98.1 F (36.7 C) (04/29 0558) Temp Source: Oral (04/29 0558) BP: 135/64 mmHg (04/29 0558) Pulse Rate: 60 (04/29 0558)  Labs:  Recent Labs  12/29/15 1423 12/29/15 1951 12/29/15 1953 12/30/15 12/30/15 0538  HGB 12.3  --   --  13.2  --   HCT 38.8  --   --  41.4  --   PLT 213  --   --  218  --   LABPROT  --   --   --  14.5  --   INR  --   --   --  1.11  --   HEPARINUNFRC  --   --   --   --  1.10*  CREATININE  --  1.22*  --  1.01*  --   TROPONINI <0.03  --  <0.03 <0.03  --     Estimated Creatinine Clearance: 56.1 mL/min (by C-G formula based on Cr of 1.01).  Assessment: Heparin while awaiting likely cath on Monday, initial heparin level is high at 1.1, no issues per RN.   Goal of Therapy:  Heparin level 0.3-0.7 units/ml Monitor platelets by anticoagulation protocol: Yes   Plan:  -Hold heparin x 1 hr -Re-start heparin at 950 units/hr at 0830 -1630 HL -Daily CBC/HL -Monitor for bleeding  Abran DukeLedford, Tsuyako Jolley 12/30/2015,7:31 AM

## 2015-12-30 NOTE — Progress Notes (Signed)
On making audit rounds noted pt bed alarm is off.  Asked if I can turn them on, pt "states that I don't want it turned on as my husband is at the bedside and I will call when I'm ready to get OOB."  Pt's primary nurse made aware.  Amanda PeaNellie Triston Lisanti, Charity fundraiserN.

## 2015-12-30 NOTE — Progress Notes (Signed)
VASCULAR LAB PRELIMINARY  PRELIMINARY  PRELIMINARY  PRELIMINARY  Bilateral lower extremity venous duplex completed.    Preliminary report:  There is no DVT or SVT noted in the bilateral lower extremities.   Chrystie Hagwood, RVT 12/30/2015, 9:20 AM

## 2015-12-31 ENCOUNTER — Inpatient Hospital Stay (HOSPITAL_COMMUNITY): Payer: Medicare Other

## 2015-12-31 LAB — ECHOCARDIOGRAM COMPLETE
Height: 67 in
Weight: 3444.8 oz

## 2015-12-31 LAB — CBC
HEMATOCRIT: 42.2 % (ref 36.0–46.0)
HEMOGLOBIN: 13.5 g/dL (ref 12.0–15.0)
MCH: 27.2 pg (ref 26.0–34.0)
MCHC: 32 g/dL (ref 30.0–36.0)
MCV: 84.9 fL (ref 78.0–100.0)
Platelets: 235 10*3/uL (ref 150–400)
RBC: 4.97 MIL/uL (ref 3.87–5.11)
RDW: 15.3 % (ref 11.5–15.5)
WBC: 6.8 10*3/uL (ref 4.0–10.5)

## 2015-12-31 LAB — HEPARIN LEVEL (UNFRACTIONATED)
Heparin Unfractionated: 0.63 IU/mL (ref 0.30–0.70)
Heparin Unfractionated: 0.54 IU/mL (ref 0.30–0.70)

## 2015-12-31 MED ORDER — ASPIRIN 81 MG PO CHEW
81.0000 mg | CHEWABLE_TABLET | ORAL | Status: AC
  Administered 2016-01-01: 81 mg via ORAL
  Filled 2015-12-31: qty 1

## 2015-12-31 MED ORDER — SODIUM CHLORIDE 0.9 % WEIGHT BASED INFUSION
3.0000 mL/kg/h | INTRAVENOUS | Status: DC
  Administered 2016-01-01: 3 mL/kg/h via INTRAVENOUS

## 2015-12-31 MED ORDER — SODIUM CHLORIDE 0.9% FLUSH: 3.0000 mL | Freq: Two times a day (BID) | INTRAVENOUS | Status: DC

## 2015-12-31 MED ORDER — SODIUM CHLORIDE 0.9 % IV SOLN: 250.0000 mL | INTRAVENOUS | Status: DC | PRN

## 2015-12-31 MED ORDER — SODIUM CHLORIDE 0.9 % WEIGHT BASED INFUSION
1.0000 mL/kg/h | INTRAVENOUS | Status: DC
  Administered 2015-12-31: 1 mL/kg/h via INTRAVENOUS

## 2015-12-31 MED ORDER — SODIUM CHLORIDE 0.9% FLUSH: 3.0000 mL | INTRAVENOUS | Status: DC | PRN

## 2015-12-31 MED ORDER — ASPIRIN 81 MG PO CHEW: 81.0000 mg | CHEWABLE_TABLET | ORAL | Status: AC

## 2015-12-31 MED ORDER — SODIUM CHLORIDE 0.9 % WEIGHT BASED INFUSION
1.0000 mL/kg/h | INTRAVENOUS | Status: DC
  Administered 2016-01-01: 1 mL/kg/h via INTRAVENOUS

## 2015-12-31 MED ORDER — SODIUM CHLORIDE 0.9% FLUSH
3.0000 mL | Freq: Two times a day (BID) | INTRAVENOUS | Status: DC
  Administered 2015-12-31: 3 mL via INTRAVENOUS

## 2015-12-31 NOTE — Progress Notes (Signed)
ANTICOAGULATION CONSULT NOTE - Follow Up Consult  Pharmacy Consult for Heparin  Indication: chest pain/ACS  Allergies  Allergen Reactions  . Adhesive [Tape] Itching and Rash  . Codeine Itching    Tolerates low dose of norco  . Elavil [Amitriptyline] Other (See Comments)    Dissociation   Patient Measurements: Height: 5\' 7"  (170.2 cm) Weight: 216 lb 3.2 oz (98.068 kg) IBW/kg (Calculated) : 61.6  Vital Signs: Temp: 98.2 F (36.8 C) (04/29 2212) Temp Source: Oral (04/29 2212) BP: 145/72 mmHg (04/29 2212) Pulse Rate: 67 (04/29 2212)  Labs:  Recent Labs  12/29/15 1423 12/29/15 1951 12/29/15 1953 12/30/15 12/30/15 0538 12/30/15 1608 12/31/15 0400  HGB 12.3  --   --  13.2  --   --  13.5  HCT 38.8  --   --  41.4  --   --  42.2  PLT 213  --   --  218  --   --  235  LABPROT  --   --   --  14.5  --   --   --   INR  --   --   --  1.11  --   --   --   HEPARINUNFRC  --   --   --   --  1.10* 0.81* 0.63  CREATININE  --  1.22*  --  1.01*  --   --   --   TROPONINI <0.03  --  <0.03 <0.03  --   --   --     Estimated Creatinine Clearance: 56.1 mL/min (by C-G formula based on Cr of 1.01).  Assessment: Heparin while awaiting likely cath on Monday, heparin level now therapeutic x 1 after multiple rate decreases  Goal of Therapy:  Heparin level 0.3-0.7 units/ml Monitor platelets by anticoagulation protocol: Yes   Plan:  -Cont heparin at 800 units/hr -1200 HL -Daily CBC/HL -Monitor for bleeding  Abran DukeLedford, Laxmi Choung 12/31/2015,5:40 AM

## 2015-12-31 NOTE — Progress Notes (Signed)
Subjective:  Troponins are negative.  Renal function has improved.  Still mild dyspnea but no recurrent chest pain.  Objective:  Vital Signs in the last 24 hours: BP 148/70 mmHg  Pulse 65  Temp(Src) 98 F (36.7 C) (Oral)  Resp 18  Ht 5\' 7"  (1.702 m)  Wt 97.659 kg (215 lb 4.8 oz)  BMI 33.71 kg/m2  SpO2 97%  Physical Exam: Mildly obese elderly black female in no acute distress Lungs:  Clear Cardiac:  Regular rhythm, normal S1 and S2, no S3 Abdomen:  Soft, nontender, no masses Extremities:  No edema present  Intake/Output from previous day: 04/29 0701 - 04/30 0700 In: 939.4 [P.O.:710; I.V.:229.4] Out: 2800 [Urine:2800]  Weight Filed Weights   12/29/15 1218 12/30/15 0558 12/31/15 0638  Weight: 99.111 kg (218 lb 8 oz) 98.068 kg (216 lb 3.2 oz) 97.659 kg (215 lb 4.8 oz)    Lab Results: Basic Metabolic Panel:  Recent Labs  16/06/9603/28/17 1951 12/30/15  NA 141 140  K 3.9 4.6  CL 102 104  CO2 26 23  GLUCOSE 95 126*  BUN 22* 24*  CREATININE 1.22* 1.01*   CBC:  Recent Labs  12/30/15 12/31/15 0400  WBC 7.1 6.8  HGB 13.2 13.5  HCT 41.4 42.2  MCV 84.8 84.9  PLT 218 235   Cardiac Panel (last 3 results)  Recent Labs  12/29/15 1423 12/29/15 1953 12/30/15  TROPONINI <0.03 <0.03 <0.03    Telemetry: Sinus rhythm  Assessment/Plan:   1.  Dyspnea and chest pain possibly consistent with unstable angina.  Her BNP level was 30 yesterday making heart failure less likely.  Troponins are negative. 2.  Coronary artery disease with previous stenting 3.  Chronic diastolic heart failure  Recommendations:  Awaiting catheterization on Monday.  Check echo because of dyspnea. Cardiac catheterization was discussed with the patient fully including risks of myocardial infarction, death, stroke, bleeding, arrhythmia, dye allergy, renal insufficiency or bleeding.  The patient understands and is willing to proceed.  [name], Jr.  MD [facility] Cardiology  12/31/2015, 9:18  AM

## 2015-12-31 NOTE — Progress Notes (Signed)
ANTICOAGULATION CONSULT NOTE - Follow Up Consult  Pharmacy Consult for Heparin  Indication: chest pain/ACS  Allergies  Allergen Reactions  . Adhesive [Tape] Itching and Rash  . Codeine Itching    Tolerates low dose of norco  . Elavil [Amitriptyline] Other (See Comments)    Dissociation   Patient Measurements: Height: 5\' 7"  (170.2 cm) Weight: 215 lb 4.8 oz (97.659 kg) IBW/kg (Calculated) : 61.6  Vital Signs: Temp: 98.2 F (36.8 C) (04/30 1216) Temp Source: Oral (04/30 1216) BP: 107/60 mmHg (04/30 1216) Pulse Rate: 61 (04/30 1216)  Labs:  Recent Labs  12/29/15 1423 12/29/15 1951 12/29/15 1953 12/30/15  12/30/15 1608 12/31/15 0400 12/31/15 1124  HGB 12.3  --   --  13.2  --   --  13.5  --   HCT 38.8  --   --  41.4  --   --  42.2  --   PLT 213  --   --  218  --   --  235  --   LABPROT  --   --   --  14.5  --   --   --   --   INR  --   --   --  1.11  --   --   --   --   HEPARINUNFRC  --   --   --   --   < > 0.81* 0.63 0.54  CREATININE  --  1.22*  --  1.01*  --   --   --   --   TROPONINI <0.03  --  <0.03 <0.03  --   --   --   --   < > = values in this interval not displayed.  Estimated Creatinine Clearance: 56 mL/min (by C-G formula based on Cr of 1.01).  Assessment: Heparin for ACS; likely cath 5/1. Confirmatory HL therapeutic at 0.54. CBC stable. No bleeding noted.   Goal of Therapy:  Heparin level 0.3-0.7 units/ml Monitor platelets by anticoagulation protocol: Yes   Plan:  -Cont heparin at 800 units/hr -Daily CBC/HL -Monitor for bleeding  Sherle Poeob Marley Charlot, PharmD Clinical Pharmacy Resident 2:08 PM, 12/31/2015

## 2016-01-01 ENCOUNTER — Encounter (HOSPITAL_COMMUNITY)
Admission: AD | Disposition: A | Discharge status: Home / Self Care | Payer: Self-pay | Source: Ambulatory Visit | Attending: Interventional Cardiology

## 2016-01-01 ENCOUNTER — Encounter (HOSPITAL_COMMUNITY): Payer: Self-pay | Admitting: Cardiovascular Disease

## 2016-01-01 DIAGNOSIS — I25110 Atherosclerotic heart disease of native coronary artery with unstable angina pectoris: Principal | ICD-10-CM

## 2016-01-01 DIAGNOSIS — I200 Unstable angina: Secondary | ICD-10-CM

## 2016-01-01 HISTORY — PX: CARDIAC CATHETERIZATION: SHX172

## 2016-01-01 LAB — CBC
HEMATOCRIT: 41.6 % (ref 36.0–46.0)
HEMOGLOBIN: 12.9 g/dL (ref 12.0–15.0)
MCH: 26.3 pg (ref 26.0–34.0)
MCHC: 31 g/dL (ref 30.0–36.0)
MCV: 84.9 fL (ref 78.0–100.0)
Platelets: 233 10*3/uL (ref 150–400)
RBC: 4.9 MIL/uL (ref 3.87–5.11)
RDW: 15.1 % (ref 11.5–15.5)
WBC: 7.2 10*3/uL (ref 4.0–10.5)

## 2016-01-01 LAB — HEPARIN LEVEL (UNFRACTIONATED): HEPARIN UNFRACTIONATED: 0.48 [IU]/mL (ref 0.30–0.70)

## 2016-01-01 SURGERY — LEFT HEART CATH AND CORONARY ANGIOGRAPHY
Anesthesia: LOCAL

## 2016-01-01 MED ORDER — BIVALIRUDIN 250 MG IV SOLR
INTRAVENOUS | Status: AC
  Filled 2016-01-01: qty 250

## 2016-01-01 MED ORDER — GI COCKTAIL ~~LOC~~: 30.0000 mL | Freq: Three times a day (TID) | ORAL | Status: DC | PRN

## 2016-01-01 MED ORDER — SODIUM CHLORIDE 0.9 % IV SOLN: 250.0000 mL | INTRAVENOUS | Status: DC | PRN

## 2016-01-01 MED ORDER — BIVALIRUDIN 250 MG IV SOLR
250.0000 mg | INTRAVENOUS | Status: DC | PRN
Start: 2016-01-01 — End: 2016-01-01
  Administered 2016-01-01: 1.75 mg/kg/h via INTRAVENOUS

## 2016-01-01 MED ORDER — BIVALIRUDIN BOLUS VIA INFUSION - CUPID
INTRAVENOUS | Status: DC | PRN
  Administered 2016-01-01: 73.725 mg via INTRAVENOUS

## 2016-01-01 MED ORDER — MIDAZOLAM HCL 2 MG/2ML IJ SOLN
INTRAMUSCULAR | Status: AC
  Filled 2016-01-01: qty 2

## 2016-01-01 MED ORDER — HEPARIN (PORCINE) IN NACL 2-0.9 UNIT/ML-% IJ SOLN
INTRAMUSCULAR | Status: AC
  Filled 2016-01-01: qty 1000

## 2016-01-01 MED ORDER — SODIUM CHLORIDE 0.9% FLUSH
3.0000 mL | Freq: Two times a day (BID) | INTRAVENOUS | Status: DC
  Administered 2016-01-02: 3 mL via INTRAVENOUS

## 2016-01-01 MED ORDER — GI COCKTAIL ~~LOC~~
30.0000 mL | Freq: Once | ORAL | Status: AC
  Administered 2016-01-01: 30 mL via ORAL
  Filled 2016-01-01: qty 30

## 2016-01-01 MED ORDER — FENTANYL CITRATE (PF) 100 MCG/2ML IJ SOLN
INTRAMUSCULAR | Status: AC
  Filled 2016-01-01: qty 2

## 2016-01-01 MED ORDER — SODIUM CHLORIDE 0.9 % IV SOLN: INTRAVENOUS | Status: AC

## 2016-01-01 MED ORDER — FENTANYL CITRATE (PF) 100 MCG/2ML IJ SOLN
INTRAMUSCULAR | Status: DC | PRN
  Administered 2016-01-01: 50 ug via INTRAVENOUS

## 2016-01-01 MED ORDER — SODIUM CHLORIDE 0.9% FLUSH: 3.0000 mL | INTRAVENOUS | Status: DC | PRN

## 2016-01-01 MED ORDER — IOPAMIDOL (ISOVUE-370) INJECTION 76%
INTRAVENOUS | Status: AC
  Filled 2016-01-01: qty 100

## 2016-01-01 MED ORDER — PANTOPRAZOLE SODIUM 40 MG PO TBEC: 40.0000 mg | DELAYED_RELEASE_TABLET | Freq: Every day | ORAL | Status: DC

## 2016-01-01 MED ORDER — CLOPIDOGREL BISULFATE 300 MG PO TABS
ORAL_TABLET | ORAL | Status: DC | PRN
  Administered 2016-01-01: 300 mg via ORAL

## 2016-01-01 MED ORDER — LIDOCAINE HCL (PF) 1 % IJ SOLN
INTRAMUSCULAR | Status: DC | PRN
  Administered 2016-01-01: 17 mL

## 2016-01-01 MED ORDER — MIDAZOLAM HCL 2 MG/2ML IJ SOLN
INTRAMUSCULAR | Status: DC | PRN
  Administered 2016-01-01: 2 mg via INTRAVENOUS

## 2016-01-01 MED ORDER — LIDOCAINE HCL (PF) 1 % IJ SOLN
INTRAMUSCULAR | Status: AC
  Filled 2016-01-01: qty 30

## 2016-01-01 MED ORDER — NITROGLYCERIN 1 MG/10 ML FOR IR/CATH LAB
INTRA_ARTERIAL | Status: AC
  Filled 2016-01-01: qty 10

## 2016-01-01 MED ORDER — CLOPIDOGREL BISULFATE 75 MG PO TABS
ORAL_TABLET | ORAL | Status: AC
  Filled 2016-01-01: qty 4

## 2016-01-01 MED ORDER — PANTOPRAZOLE SODIUM 40 MG PO TBEC
40.0000 mg | DELAYED_RELEASE_TABLET | Freq: Every day | ORAL | Status: DC
  Administered 2016-01-02: 40 mg via ORAL
  Filled 2016-01-01: qty 1

## 2016-01-01 MED ORDER — IOPAMIDOL (ISOVUE-370) INJECTION 76%
INTRAVENOUS | Status: DC | PRN
  Administered 2016-01-01: 200 mL via INTRAVENOUS

## 2016-01-01 MED ORDER — FAMOTIDINE IN NACL 20-0.9 MG/50ML-% IV SOLN
20.0000 mg | Freq: Once | INTRAVENOUS | Status: AC
  Administered 2016-01-01: 20:00:00 20 mg via INTRAVENOUS
  Filled 2016-01-01: qty 50

## 2016-01-01 MED ORDER — RANITIDINE HCL 150 MG/10ML PO SYRP
150.0000 mg | ORAL_SOLUTION | Freq: Once | ORAL | Status: AC
  Administered 2016-01-01: 150 mg via ORAL
  Filled 2016-01-01: qty 10

## 2016-01-01 MED ORDER — HEPARIN (PORCINE) IN NACL 2-0.9 UNIT/ML-% IJ SOLN
INTRAMUSCULAR | Status: DC | PRN
  Administered 2016-01-01: 1000 mL

## 2016-01-01 MED ORDER — IOPAMIDOL (ISOVUE-370) INJECTION 76%
INTRAVENOUS | Status: AC
  Filled 2016-01-01: qty 50

## 2016-01-01 MED ORDER — ANGIOPLASTY BOOK
Freq: Once | Status: DC
  Filled 2016-01-01: qty 1

## 2016-01-01 SURGICAL SUPPLY — 18 items
BALLN EMERGE MR 2.0X12 (BALLOONS) ×2
BALLN ~~LOC~~ EUPHORA RX 2.75X15 (BALLOONS) ×2
BALLOON EMERGE MR 2.0X12 (BALLOONS) IMPLANT
BALLOON ~~LOC~~ EUPHORA RX 2.75X15 (BALLOONS) IMPLANT
CATH INFINITI 5FR MULTPACK ANG (CATHETERS) ×1 IMPLANT
CATH VISTA GUIDE 6FR JR4 SH (CATHETERS) ×1 IMPLANT
DEVICE WIRE ANGIOSEAL 6FR (Vascular Products) ×1 IMPLANT
KIT ENCORE 26 ADVANTAGE (KITS) ×1 IMPLANT
KIT HEART LEFT (KITS) ×2 IMPLANT
PACK CARDIAC CATHETERIZATION (CUSTOM PROCEDURE TRAY) ×2 IMPLANT
SHEATH PINNACLE 5F 10CM (SHEATH) ×1 IMPLANT
SHEATH PINNACLE 6F 10CM (SHEATH) ×1 IMPLANT
STENT PROMUS PREM MR 2.5X20 (Permanent Stent) ×1 IMPLANT
SYR MEDRAD MARK V 150ML (SYRINGE) ×2 IMPLANT
TRANSDUCER W/STOPCOCK (MISCELLANEOUS) ×2 IMPLANT
TUBING CIL FLEX 10 FLL-RA (TUBING) ×1 IMPLANT
WIRE COUGAR XT STRL 190CM (WIRE) ×1 IMPLANT
WIRE EMERALD 3MM-J .035X150CM (WIRE) ×1 IMPLANT

## 2016-01-01 NOTE — Interval H&P Note (Signed)
History and Physical Interval Note:  01/01/2016 8:35 AM  [name]  has presented today for cardiac cath with the diagnosis of known CAD, unstable angina. The various methods of treatment have been discussed with the patient and family. After consideration of risks, benefits and other options for treatment, the patient has consented to  Procedure(s): Left Heart Cath and Coronary Angiography (N/A) as a surgical intervention .  The patient's history has been reviewed, patient examined, no change in status, stable for surgery.  I have reviewed the patient's chart and labs.  Questions were answered to the patient's satisfaction.     [name]

## 2016-01-01 NOTE — Progress Notes (Signed)
1440 patient stated she had chest and left lateral back pain again. 1445 Reported vomiting a scant amount of bile and having burning in esophageal area, "like acid reflux". Patient stated she also had these symptoms when she had her heart pain. Given zofran 4 mg IV. 12 lead ECG obtained . Paged Boyce MediciBrittany Simmons PA. Paged Corine ShelterLuke Kilroy PA at [phone].

## 2016-01-01 NOTE — Progress Notes (Signed)
1200 patient complained of chest pain, achy 5/10 under right breast. No shortness of breath, nausea, lightheadedness or dizziness. No change with palpation of area or deep breathing or movement. 12 lead ECG obtained with no noted change, paged Boyce MediciBrittany Simmons PA to evaluate 12 lead and advise on treatment. Patient stated Ntg gives her a severe headache, will not administer at this time. Patient complained of pain, consistent with pain under right breast, on left lateral side of back at 1250. Spoke to Sprint Nextel CorporationBrittany Simmons PA. Order given for GI cocktail now. Patient given GI cocktail at 1319. 1400 Stated she felt better.

## 2016-01-01 NOTE — H&P (View-Only) (Signed)
Subjective:  Troponins are negative.  Renal function has improved.  Still mild dyspnea but no recurrent chest pain.  Objective:  Vital Signs in the last 24 hours: BP 148/70 mmHg  Pulse 65  Temp(Src) 98 F (36.7 C) (Oral)  Resp 18  Ht 5\' 7"  (1.702 m)  Wt 97.659 kg (215 lb 4.8 oz)  BMI 33.71 kg/m2  SpO2 97%  Physical Exam: Mildly obese elderly black female in no acute distress Lungs:  Clear Cardiac:  Regular rhythm, normal S1 and S2, no S3 Abdomen:  Soft, nontender, no masses Extremities:  No edema present  Intake/Output from previous day: 04/29 0701 - 04/30 0700 In: 939.4 [P.O.:710; I.V.:229.4] Out: 2800 [Urine:2800]  Weight Filed Weights   12/29/15 1218 12/30/15 0558 12/31/15 0638  Weight: 99.111 kg (218 lb 8 oz) 98.068 kg (216 lb 3.2 oz) 97.659 kg (215 lb 4.8 oz)    Lab Results: Basic Metabolic Panel:  Recent Labs  81/19/1404/28/17 1951 12/30/15  NA 141 140  K 3.9 4.6  CL 102 104  CO2 26 23  GLUCOSE 95 126*  BUN 22* 24*  CREATININE 1.22* 1.01*   CBC:  Recent Labs  12/30/15 12/31/15 0400  WBC 7.1 6.8  HGB 13.2 13.5  HCT 41.4 42.2  MCV 84.8 84.9  PLT 218 235   Cardiac Panel (last 3 results)  Recent Labs  12/29/15 1423 12/29/15 1953 12/30/15  TROPONINI <0.03 <0.03 <0.03    Telemetry: Sinus rhythm  Assessment/Plan:   1.  Dyspnea and chest pain possibly consistent with unstable angina.  Her BNP level was 30 yesterday making heart failure less likely.  Troponins are negative. 2.  Coronary artery disease with previous stenting 3.  Chronic diastolic heart failure  Recommendations:  Awaiting catheterization on Monday.  Check echo because of dyspnea. Cardiac catheterization was discussed with the patient fully including risks of myocardial infarction, death, stroke, bleeding, arrhythmia, dye allergy, renal insufficiency or bleeding.  The patient understands and is willing to proceed.  Darden PalmerW. Spencer Tilley, Jr.  MD [facility] Cardiology  12/31/2015, 9:18  AM

## 2016-01-01 NOTE — Progress Notes (Signed)
       Patient Name: Catherine HurterCarolyn [name] [name] Date of Encounter: 01/01/2016    SUBJECTIVE:the patient underwent PCI this morning was stenting of the ostial to proximal RCA. Currently doing well without recurrent chest pain.  TELEMETRY:  Normal sinus rhythm Filed Vitals:   01/01/16 1004 01/01/16 1015 01/01/16 1020 01/01/16 1030  BP:   158/57 134/59  Pulse: 235  56 61  Temp:  97.7 F (36.5 C)    TempSrc:  Oral    Resp: 0   15  Height:      Weight:      SpO2: 0%  81% 95%    Intake/Output Summary (Last 24 hours) at 01/01/16 1215 Last data filed at 01/01/16 0956  Gross per 24 hour  Intake 632.15 ml  Output   1475 ml  Net -842.85 ml   LABS: Basic Metabolic Panel:  Recent Labs  56/21/3004/28/17 1951 12/30/15  NA 141 140  K 3.9 4.6  CL 102 104  CO2 26 23  GLUCOSE 95 126*  BUN 22* 24*  CREATININE 1.22* 1.01*  CALCIUM 9.3 9.1  MG 2.2  --    CBC:  Recent Labs  12/31/15 0400 01/01/16 0251  WBC 6.8 7.2  HGB 13.5 12.9  HCT 42.2 41.6  MCV 84.9 84.9  PLT 235 233   Cardiac Enzymes:  Recent Labs  12/29/15 1423 12/29/15 1953 12/30/15  TROPONINI <0.03 <0.03 <0.03   BNP: Invalid input(s): POCBNP Hemoglobin A1C: No results for input(s): HGBA1C in the last 72 hours. Fasting Lipid Panel:  Recent Labs  12/30/15  CHOL 271*  HDL 49  LDLCALC 204*  TRIG 90  CHOLHDL 5.5    Radiology/Studies:  No new data. No evidence of heart failure noted on initial chest x-ray  Physical Exam: Blood pressure 134/59, pulse 61, temperature 97.7 F (36.5 C), temperature source Oral, resp. rate 15, height 5\' 7"  (1.702 m), weight 216 lb 11.2 oz (98.294 kg), SpO2 95 %. Weight change: 1 lb 6.4 oz (0.635 kg)  Wt Readings from Last 3 Encounters:  01/01/16 216 lb 11.2 oz (98.294 kg)  12/29/15 221 lb 6.4 oz (100.426 kg)  05/03/15 217 lb (98.431 kg)   The right femoral cath site is unremarkable  ECG immediately post-procedure is without acute ischemic change.  ASSESSMENT:  1.  Presentation with progressive dyspnea and chest discomfort felt to be secondary to regression of de novo disease in the right coronary producing unstable angina pectoris. 2. Chronic diastolic heart failure but without evidence of  Hemodynamic instability or volume overload. 3. Essential hypertension  Plan:  1.  Aspirin and Plavix will be continued 2. Possible discharge tomorrow depending upon the patient's clinical status  Signed, Lesleigh NoeSMITH III,Loran Auguste W 01/01/2016, 12:15 PM

## 2016-01-01 NOTE — Progress Notes (Signed)
   ECG's reviewed for ongoing pain. No acute abnormality noted.  Suspect the discomfort is GI related to plavix..  Will give IV Pepcid and start protonix.

## 2016-01-02 DIAGNOSIS — I10 Essential (primary) hypertension: Secondary | ICD-10-CM

## 2016-01-02 DIAGNOSIS — E785 Hyperlipidemia, unspecified: Secondary | ICD-10-CM

## 2016-01-02 DIAGNOSIS — I2510 Atherosclerotic heart disease of native coronary artery without angina pectoris: Secondary | ICD-10-CM

## 2016-01-02 LAB — BASIC METABOLIC PANEL
ANION GAP: 9 (ref 5–15)
BUN: 25 mg/dL — ABNORMAL HIGH (ref 6–20)
CHLORIDE: 102 mmol/L (ref 101–111)
CO2: 28 mmol/L (ref 22–32)
Calcium: 9.3 mg/dL (ref 8.9–10.3)
Creatinine, Ser: 1.29 mg/dL — ABNORMAL HIGH (ref 0.44–1.00)
GFR calc non Af Amer: 39 mL/min — ABNORMAL LOW (ref >60)
GFR, EST AFRICAN AMERICAN: 45 mL/min — AB (ref >60)
Glucose, Bld: 113 mg/dL — ABNORMAL HIGH (ref 65–99)
Potassium: 4.6 mmol/L (ref 3.5–5.1)
Sodium: 139 mmol/L (ref 135–145)

## 2016-01-02 LAB — POCT ACTIVATED CLOTTING TIME: Activated Clotting Time: 662 seconds

## 2016-01-02 LAB — CBC
HEMATOCRIT: 40.7 % (ref 36.0–46.0)
HEMOGLOBIN: 12.7 g/dL (ref 12.0–15.0)
MCH: 26.1 pg (ref 26.0–34.0)
MCHC: 31.2 g/dL (ref 30.0–36.0)
MCV: 83.7 fL (ref 78.0–100.0)
Platelets: 232 10*3/uL (ref 150–400)
RBC: 4.86 MIL/uL (ref 3.87–5.11)
RDW: 15.5 % (ref 11.5–15.5)
WBC: 6.8 10*3/uL (ref 4.0–10.5)

## 2016-01-02 MED ORDER — NITROGLYCERIN 0.4 MG SL SUBL: 0.4000 mg | SUBLINGUAL_TABLET | SUBLINGUAL | Status: DC | PRN

## 2016-01-02 MED ORDER — CLOPIDOGREL BISULFATE 75 MG PO TABS: 75.0000 mg | ORAL_TABLET | Freq: Every day | ORAL | Status: DC

## 2016-01-02 MED ORDER — PANTOPRAZOLE SODIUM 40 MG PO TBEC: 40.0000 mg | DELAYED_RELEASE_TABLET | Freq: Every day | ORAL | Status: DC

## 2016-01-02 MED FILL — Nitroglycerin IV Soln 100 MCG/ML in D5W: INTRA_ARTERIAL | Qty: 10 | Status: AC

## 2016-01-02 MED FILL — Clopidogrel Bisulfate Tab 75 MG (Base Equiv): ORAL | Qty: 4 | Status: AC

## 2016-01-02 NOTE — Care Management Important Message (Signed)
Important Message  Patient Details  Name: [name] MRN: [id number] Date of Birth: 16-Sep-1935   Medicare Important Message Given:  Yes    [name] 01/02/2016, 1:31 PM

## 2016-01-02 NOTE — Progress Notes (Signed)
Patient Profile: 80 y/o female with h/o CAD s/p prior LAD and circumflex drug-eluting stents, hypertension, chronic diastolic heart failure, HLD and paroxysmal atrial fibrillation not on anticoagulation.  She was admitted for unstable angina.   Subjective: Doing well this morning. Denies any CP or dyspnea. Right groin is stable. Ambulating w/o difficulty.   Objective: Vital signs in last 24 hours: Temp:  [97.1 F (36.2 C)-98.2 F (36.8 C)] 97.1 F (36.2 C) (05/02 0728) Pulse Rate:  [0-235] 72 (05/02 0728) Resp:  [0-23] 23 (05/02 0728) BP: (120-165)/(57-115) 139/70 mmHg (05/02 0728) SpO2:  [0 %-100 %] 99 % (05/02 0728) Weight:  [218 lb 4.1 oz (99 kg)] 218 lb 4.1 oz (99 kg) (05/02 0534) Last BM Date: 12/31/15  Intake/Output from previous day: 05/01 0701 - 05/02 0700 In: 791.3 [P.O.:360; I.V.:431.3] Out: 3350 [Urine:3350] Intake/Output this shift:    Medications Current Facility-Administered Medications  Medication Dose Route Frequency Provider Last Rate Last Dose  . 0.9 %  sodium chloride infusion  250 mL Intravenous PRN Kathleene Hazel, MD      . acetaminophen (TYLENOL) tablet 650 mg  650 mg Oral Q4H PRN Ellsworth Lennox, PA      . albuterol (PROVENTIL) (2.5 MG/3ML) 0.083% nebulizer solution 3 mL  3 mL Inhalation Q6H PRN Ellsworth Lennox, PA      . ALPRAZolam Prudy Feeler) tablet 0.25 mg  0.25 mg Oral BID Ellsworth Lennox, PA   0.25 mg at 01/01/16 2247  . angioplasty book   Does not apply Once Lyn Records, MD      . aspirin EC tablet 81 mg  81 mg Oral Daily Lyn Records, MD   81 mg at 12/31/15 1000  . clopidogrel (PLAVIX) tablet 75 mg  75 mg Oral Daily Ellsworth Lennox, [place]   75 mg at 12/31/15 1000  . cyclobenzaprine (FLEXERIL) tablet 5 mg  5 mg Oral TID PRN Leone Brand, NP   5 mg at 12/29/15 2056  . furosemide (LASIX) injection 40 mg  40 mg Intravenous BID Lyn Records, MD   40 mg at 01/01/16 2028  . gi cocktail (Maalox,Lidocaine,Donnatal)  30 mL Oral TID  PRN Abelino Derrick, PA-C      . irbesartan (AVAPRO) tablet 150 mg  150 mg Oral Daily Lyn Records, MD   150 mg at 01/01/16 1233   And  . hydrochlorothiazide (MICROZIDE) capsule 12.5 mg  12.5 mg Oral Daily Lyn Records, MD   12.5 mg at 01/01/16 1247  . HYDROcodone-acetaminophen (NORCO/VICODIN) 5-325 MG per tablet 0.5-1 tablet  0.5-1 tablet Oral Q6H PRN Ellsworth Lennox, PA   1 tablet at 12/31/15 1731  . loratadine (CLARITIN) tablet 10 mg  10 mg Oral Daily Ellsworth Lennox, [place]   10 mg at 01/01/16 1234  . metoprolol tartrate (LOPRESSOR) tablet 12.5 mg  12.5 mg Oral BID Lyn Records, MD   12.5 mg at 01/01/16 2247  . nitroGLYCERIN (NITROSTAT) SL tablet 0.4 mg  0.4 mg Sublingual Q5 min PRN Ellsworth Lennox, PA      . ondansetron [facility]) injection 4 mg  4 mg Intravenous Q6H PRN Ellsworth Lennox, PA   4 mg at 01/01/16 1650  . pantoprazole (PROTONIX) EC tablet 40 mg  40 mg Oral Q0600 Eda Paschal Kilroy, PA-C      . potassium chloride (K-DUR,KLOR-CON) CR tablet 30 mEq  30 mEq Oral Daily [place], [place]   30 mEq at  01/01/16 1459  . pregabalin (LYRICA) capsule 75 mg  75 mg Oral BID Ellsworth Lennox, [place]   75 mg at 01/01/16 2247  . sodium chloride flush (NS) 0.9 % injection 3 mL  3 mL Intravenous Q12H Kathleene Hazel, MD      . sodium chloride flush (NS) 0.9 % injection 3 mL  3 mL Intravenous PRN Kathleene Hazel, MD      . zolpidem (AMBIEN) tablet 5 mg  5 mg Oral Daily PRN Ellsworth Lennox, PA        PE: General appearance: alert, cooperative and no distress Neck: no carotid bruit and no JVD Lungs: clear to auscultation bilaterally Heart: regular rate and rhythm, S1, S2 normal, no murmur, click, rub or gallop Extremities: chronic ankle edema L>R Pulses: 2+ and symmetric Skin: warm and dr Neurologic: Grossly normal  Lab Results:   Recent Labs  12/31/15 0400 01/01/16 0251 01/02/16 0447  WBC 6.8 7.2 6.8  HGB 13.5 12.9 12.7  HCT 42.2 41.6 40.7  PLT 235 233 232    BMET  Recent Labs  01/02/16 0447  NA 139  K 4.6  CL 102  CO2 28  GLUCOSE 113*  BUN 25*  CREATININE 1.29*  CALCIUM 9.3   Lipid Panel     Component Value Date/Time   CHOL 271* 12/30/2015 0000   TRIG 90 12/30/2015 0000   HDL 49 12/30/2015 0000   CHOLHDL 5.5 12/30/2015 0000   VLDL 18 12/30/2015 0000   LDLCALC 204* 12/30/2015 0000    Cardiac Panel (last 3 results) No results for input(s): CKTOTAL, CKMB, TROPONINI, RELINDX in the last 72 hours.  Studies/Results: Procedures    Coronary Stent Intervention   Left Heart Cath and Coronary Angiography    Conclusion     1st Diag lesion, 70% stenosed.  Prox LAD to Mid LAD lesion, 30% stenosed.  Dist LAD lesion, 10% stenosed. The lesion was previously treated with a drug-eluting stent one to two years ago.  Ost Cx lesion, 60% stenosed.  LM lesion, 20% stenosed.  Mid RCA lesion, 40% stenosed.  Ost RCA to Prox RCA lesion, 50% stenosed.  Ost RCA lesion, 90% stenosed. Post intervention, there is a 0% residual stenosis.  The left ventricular systolic function is normal.  1. Triple vessel CAD with unstable angina. Patent stents mid LAD and Circumflex 2. Severe stenosis ostium RCA.  3. Normal LV systolic function.  4. Successful PTCA/DES x 1 ostium RCA  Recommendations: Will continue ASA, Plavix for at least one year.      Assessment/Plan  Active Problems:   CAD (coronary artery disease)   Hyperlipidemia   Essential hypertension   Unstable angina (HCC)   1. Unstable Angina: felt secondary to RCA disease. Now s/p PCI. See problem #2. Ambulating w/o difficulty. No CP or dyspnea.   2. CAD: LHC 01/01/16 demonstrated triple vessel CAD with patent stents in the mid LAD and Circumflex. There was severe stenosis of the ostial RCA successfully treated with PCI + DES. No recurrent CP or dyspnea. Continue DAPT with ASA + Plavix, along with statin, BB and ARB.   3. Chronic Diastolic HF: stable.  EF reassessed by cath  and was normal.   4. HTN: controlled this am. Continue BB, ARB and thiazide diuretic.   5. ?PAF: patient unsure of history of this. She is currently in NSR. Rate is stable. Not on anticoagulation.  6. HLD: poorly controlled. LDL elevated at 204 mg/dL. Was taking Livalo at home. Will  need more potent statin for LDL reduction for secondary prevention of CAD. Will need to consider Lipitor vs Crestor. If can not tolerate, consider Lipid clinic referral for PCSK9 inhibitor.   7. Reflux: Improved. Prescribe Protonix on discharge.     LOS: 4 days    Brittainy M. Delmer IslamSimmons, PA-C 01/02/2016 8:43 AM  The patient has been seen in conjunction with Robbie LisBrittainy Simmons, PAC. All aspects of care have been considered and discussed. The patient has been personally interviewed, examined, and all clinical data has been reviewed.   She had resolution of dyspepsia last PM after IV pepcid.  Has ambulated this AM with no problem.  Ready for discharge.

## 2016-01-02 NOTE — Discharge Summary (Signed)
Discharge Summary    Patient ID: [name],  MRN: [id number], DOB/AGE: [birth year] 80 y.o.  Admit date: 12/29/2015 Discharge date: 01/02/2016  Primary Care Provider: [name] Primary Cardiologist: Dr. Katrinka Blazing  Discharge Diagnoses    Active Problems:   CAD (coronary artery disease)   Hyperlipidemia   Essential hypertension   Unstable angina (HCC)   Allergies Allergies  Allergen Reactions  . Adhesive [Tape] Itching and Rash  . Codeine Itching    Tolerates low dose of norco  . Elavil [Amitriptyline] Other (See Comments)    Dissociation    Diagnostic Studies/Procedures    2D echo 12/31/15 LV EF: 55% - 60%  ------------------------------------------------------------------- Indications: Chest pain 786.51. Dyspnea 786.09.  ------------------------------------------------------------------- History: PMH: Atrial fibrillation. Coronary artery disease. Risk factors: Hypertension. Dyslipidemia.  ------------------------------------------------------------------- Study Conclusions  - Left ventricle: The cavity size was normal. Systolic function was  normal. The estimated ejection fraction was in the range of 55%  to 60%. - Pulmonary arteries: Systolic pressure was mildly increased. PA  peak pressure: 31 mm Hg (S).  LHC 01/01/16 Procedures    Coronary Stent Intervention   Left Heart Cath and Coronary Angiography    Conclusion     1st Diag lesion, 70% stenosed.  Prox LAD to Mid LAD lesion, 30% stenosed.  Dist LAD lesion, 10% stenosed. The lesion was previously treated with a drug-eluting stent one to two years ago.  Ost Cx lesion, 60% stenosed.  LM lesion, 20% stenosed.  Mid RCA lesion, 40% stenosed.  Ost RCA to Prox RCA lesion, 50% stenosed.  Ost RCA lesion, 90% stenosed. Post intervention, there is a 0% residual stenosis.  The left ventricular systolic function is normal.  1. Triple vessel CAD with unstable  angina. Patent stents mid LAD and Circumflex 2. Severe stenosis ostium RCA.  3. Normal LV systolic function.  4. Successful PTCA/DES x 1 ostium RCA      History of Present Illness     80 y/o AA female, followed by Dr. Katrinka Blazing, with a h/o CAD s/p prior LAD and circumflex drug-eluting stents, hypertension, chronic diastolic heart failure and HLD. She was seen in clinic by Dr. Katrinka Blazing 12/29/15 and complained of intermittent neck and chest pressure, accompanied by exertional dyspnea and fatigue. She was also observed to have bilateral LEE, R>L. She was directly admitted from the office to [facility] for unstable angina and acute on chronic diastolic HF.   Hospital Course     Patient was admitted to telemetry. Troponin was checked and was negative. She was treated with IV lasix and edema improved. She diuresed a total of 3.4 L. On 01/01/16, she underwent LHC. Access was obtained via the right femoral artery. Procedure was performed by Dr. Clifton James. She was found to have triple vessel CAD with patent stents in the mid LAD and circumflex. There was severe ostial RCA stenosis, 90%. She underwent successful PCI + DES to the RCA. LVF was accessed and was normal by cath. EF was estimated at 55-65%. She tolerated the procedure well and left the cath lab in stable condition. She was continued on DAPT with ASA, Plavix, along with statin, BB and ARB. She was monitored overnight and had no post cath complications. She had no recurrent angina, exertional dyspnea nor fatigue. She ambulated with cardiac rehab w/o difficulty. Femoral access site remained stable. She did have reflux after her first meal following cath that was relieved with Protonix. She was last seen and examined by Dr. Katrinka Blazing who determined she  was stable for discharge home.    Consultants: none  Discharge Vitals Blood pressure 139/70, pulse 72, temperature 97.1 F (36.2 C), temperature source Oral, resp. rate 23, height 5\' 7"  (1.702 m), weight 218 lb 4.1 oz (99  kg), SpO2 99 %.  Filed Weights   12/31/15 0638 01/01/16 0652 01/02/16 0534  Weight: 215 lb 4.8 oz (97.659 kg) 216 lb 11.2 oz (98.294 kg) 218 lb 4.1 oz (99 kg)    Labs & Radiologic Studies    CBC  Recent Labs  01/01/16 0251 01/02/16 0447  WBC 7.2 6.8  HGB 12.9 12.7  HCT 41.6 40.7  MCV 84.9 83.7  PLT 233 232   Basic Metabolic Panel  Recent Labs  01/02/16 0447  NA 139  K 4.6  CL 102  CO2 28  GLUCOSE 113*  BUN 25*  CREATININE 1.29*  CALCIUM 9.3   Liver Function Tests No results for input(s): AST, ALT, ALKPHOS, BILITOT, PROT, ALBUMIN in the last 72 hours. No results for input(s): LIPASE, AMYLASE in the last 72 hours. Cardiac Enzymes No results for input(s): CKTOTAL, CKMB, CKMBINDEX, TROPONINI in the last 72 hours. BNP Invalid input(s): POCBNP D-Dimer No results for input(s): DDIMER in the last 72 hours. Hemoglobin A1C No results for input(s): HGBA1C in the last 72 hours. Fasting Lipid Panel No results for input(s): CHOL, HDL, LDLCALC, TRIG, CHOLHDL, LDLDIRECT in the last 72 hours. Thyroid Function Tests No results for input(s): TSH, T4TOTAL, T3FREE, THYROIDAB in the last 72 hours.  Invalid input(s): FREET3 _____________  Dg Chest 2 View  12/29/2015  CLINICAL DATA:  Bilateral jaw pain and left-sided tightness for 1 week. EXAM: CHEST  2 VIEW COMPARISON:  11/08/2014 FINDINGS: The cardiomediastinal contours are normal. Chronic interstitial basilar opacities. Pulmonary vasculature is normal. No consolidation, pleural effusion, or pneumothorax. No acute osseous abnormalities are seen. IMPRESSION: No active cardiopulmonary disease. Electronically Signed   By: Rubye Oaks M.D.   On: 12/29/2015 14:30   Disposition   Pt is being discharged home today in good condition.  Follow-up Plans & Appointments    Follow-up Information    Follow up with Tereso Newcomer, PA-C On 01/16/2016.   Specialties:  Cardiology, Physician Assistant   Why:  10:30 AM (Cardiology follow-up  with Dr. Michaelle Copas PA)    Contact information:   [address] [place] [address] [phone]      Discharge Instructions    Amb Referral to Cardiac Rehabilitation    Complete by:  As directed   Diagnosis:  Coronary Stents     Diet - low sodium heart healthy    Complete by:  As directed      Increase activity slowly    Complete by:  As directed            Discharge Medications   Current Discharge Medication List    START taking these medications   Details  pantoprazole (PROTONIX) 40 MG tablet Take 1 tablet (40 mg total) by mouth daily at 6 (six) AM. Qty: 30 tablet, Refills: 1      CONTINUE these medications which have CHANGED   Details  clopidogrel (PLAVIX) 75 MG tablet Take 1 tablet (75 mg total) by mouth daily. Qty: 30 tablet, Refills: 11    nitroGLYCERIN (NITROSTAT) 0.4 MG SL tablet Place 1 tablet (0.4 mg total) under the tongue every 5 (five) minutes as needed for chest pain (CP or SOB). Qty: 25 tablet, Refills: 2      CONTINUE these medications  which have NOT CHANGED   Details  acetaminophen (TYLENOL) 500 MG tablet Take 500 mg by mouth daily as needed (pain).    albuterol (PROVENTIL HFA;VENTOLIN HFA) 108 (90 BASE) MCG/ACT inhaler Inhale 2 puffs into the lungs every 6 (six) hours as needed for wheezing. Qty: 1 Inhaler, Refills: 3    ALPRAZolam (XANAX) 0.25 MG tablet Take 0.25 mg by mouth 2 (two) times daily as needed for anxiety. anxiety    aspirin EC 81 MG tablet Take 81 mg by mouth daily.      cetirizine (ZYRTEC) 10 MG tablet Take 10 mg by mouth daily.    Diphenhydramine-APAP, sleep, (TYLENOL PM EXTRA STRENGTH PO) Take 1 tablet by mouth at bedtime as needed. For sleep    furosemide (LASIX) 20 MG tablet Take 10 mg by mouth daily.     lidocaine (LIDODERM) 5 % Place 1 patch onto the skin daily as needed (pain).     LYRICA 75 MG capsule Take 75 mg by mouth 2 (two) times daily.    metoprolol tartrate (LOPRESSOR) 25 MG tablet Take 0.5  tablets (12.5 mg total) by mouth 2 (two) times daily. Qty: 60 tablet, Refills: 2    Pitavastatin Calcium (LIVALO) 2 MG TABS Take 1 tablet (2 mg total) by mouth daily.    potassium chloride SA (K-DUR,KLOR-CON) 20 MEQ tablet Take 1.5 tablets (30 mEq total) by mouth daily. Qty: 45 tablet, Refills: 5    valsartan-hydrochlorothiazide (DIOVAN-HCT) 160-12.5 MG per tablet Take 1 tablet by mouth daily.     Vitamin D, Ergocalciferol, (DRISDOL) 50000 UNITS CAPS capsule Take 50,000 Units by mouth every 7 (seven) days. Take on Mondays    Coenzyme Q10 (COQ-10) 100 MG CAPS Take 100 mg by mouth daily. Reported on 12/29/2015    conjugated estrogens (PREMARIN) vaginal cream Place 1 Applicatorful vaginally daily as needed (dryness). Reported on 12/29/2015    docusate sodium (COLACE) 100 MG capsule Take 1 capsule (100 mg total) by mouth 2 (two) times daily as needed for mild constipation. Qty: 60 capsule, Refills: 0    fluticasone (FLONASE) 50 MCG/ACT nasal spray Place 2 sprays into both nostrils daily. Qty: 1 g, Refills: 2    HYDROcodone-acetaminophen (NORCO/VICODIN) 5-325 MG per tablet Take 0.5-1 tablets by mouth every 6 (six) hours as needed for moderate pain. Qty: 20 tablet, Refills: 0    loratadine (CLARITIN) 10 MG tablet Take 10 mg by mouth daily. Reported on 12/29/2015    zolpidem (AMBIEN) 10 MG tablet Take 5 mg by mouth daily as needed for sleep. Reported on 12/29/2015         Aspirin prescribed at discharge?  Yes High Intensity Statin Prescribed? (Lipitor 40-80mg  or Crestor 20-40mg ): No: continued on Livalo Beta Blocker Prescribed? Yes For EF <40%, was ACEI/ARB Prescribed? Yes ADP Receptor Inhibitor Prescribed? (i.e. Plavix etc.-Includes Medically Managed Patients): Yes For EF <40%, Aldosterone Inhibitor Prescribed? No: EF >40% Was EF assessed during THIS hospitalization? Yes Was Cardiac Rehab II ordered? (Included Medically managed Patients): No:    Outstanding Labs/Studies    Duration  of Discharge Encounter   Greater than 30 minutes including physician time.  Signed, SIMMONS, BRITTAINY PA-C 01/02/2016, 10:19 AM    The patient has been seen in conjunction with B. Sharol Harness, PAC. All aspects of care have been considered and discussed. The patient has been personally interviewed, examined, and all clinical data has been reviewed.   Stable after ambulating. Presentations was due to USAP from high grade ostial RCA.  Plan discharge, Phase 2  Cardiac rehab.  New therapy will include plavix x 1 year.  I discussed residual disease in the ostial circumflex which will be problematic to treat from interventional standpoint.

## 2016-01-02 NOTE — Progress Notes (Signed)
CARDIAC REHAB PHASE I   PRE:  Rate/Rhythm: 82 SR  BP:  Sitting: 139/70        SaO2: 97 RA  MODE:  Ambulation: 600 ft   POST:  Rate/Rhythm: 90 SR  BP:  Sitting: 145/91         SaO2: 100 RA  Pt ambulated 600 ft on RA, independent, steady gait, tolerated well, no complaints. Completed PCI/stent education.  Reviewed risk factors, anti-platelet therapy, stent card, activity restrictions, ntg, exercise, heart healthy diet, s/s heart failure, daily weights, sodium restrictions and phase 2 cardiac rehab. Pt verbalized understanding, receptive to information. 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 Pt states she is looking into resources for additional support at home. Pt agrees to phase 2 cardiac rehab referral (pt has completed program twice before), [facility]. Pt to edge of bed after walk, call bell within reach.  [phone] Catherine GrapesEmily C Gionni Vaca, RN, BSN 01/02/2016 9:00 AM

## 2016-01-02 NOTE — Care Management Note (Addendum)
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  Patient is for dc today.  No other needs.               Action/Plan:   Expected Discharge Date:                  Expected Discharge Plan:  Home/Self Care  In-House Referral:     Discharge planning Services  CM Consult  Post Acute Care Choice:    Choice offered to:     DME Arranged:    DME Agency:     HH Arranged:    HH Agency:     Status of Service:  Completed, signed off  Medicare Important Message Given:    Date Medicare IM Given:    Medicare IM give by:    Date Additional Medicare IM Given:    Additional Medicare Important Message give by:     If discussed at Long Length of Stay Meetings, dates discussed:    Additional Comments:  Leone Havenaylor, Agustus Mane Clinton, RN 01/02/2016, 10:46 AM

## 2016-01-16 ENCOUNTER — Telehealth: Payer: Self-pay | Admitting: *Deleted

## 2016-01-16 ENCOUNTER — Ambulatory Visit (INDEPENDENT_AMBULATORY_CARE_PROVIDER_SITE_OTHER): Payer: Medicare Other | Admitting: Physician Assistant

## 2016-01-16 ENCOUNTER — Encounter: Payer: Self-pay | Admitting: Physician Assistant

## 2016-01-16 VITALS — BP 112/78 | HR 72 | Ht 67.0 in | Wt 222.6 lb

## 2016-01-16 DIAGNOSIS — E785 Hyperlipidemia, unspecified: Secondary | ICD-10-CM

## 2016-01-16 DIAGNOSIS — I2510 Atherosclerotic heart disease of native coronary artery without angina pectoris: Secondary | ICD-10-CM

## 2016-01-16 DIAGNOSIS — I10 Essential (primary) hypertension: Secondary | ICD-10-CM | POA: Diagnosis not present

## 2016-01-16 DIAGNOSIS — I5032 Chronic diastolic (congestive) heart failure: Secondary | ICD-10-CM

## 2016-01-16 DIAGNOSIS — R42 Dizziness and giddiness: Secondary | ICD-10-CM

## 2016-01-16 HISTORY — DX: Chronic diastolic (congestive) heart failure: I50.32

## 2016-01-16 LAB — BASIC METABOLIC PANEL WITH GFR
BUN: 17 mg/dL (ref 7–25)
CO2: 30 mmol/L (ref 20–31)
Calcium: 9.2 mg/dL (ref 8.6–10.4)
Chloride: 102 mmol/L (ref 98–110)
Creat: 1.09 mg/dL — ABNORMAL HIGH (ref 0.60–0.93)
Glucose, Bld: 92 mg/dL (ref 65–99)
Potassium: 4.2 mmol/L (ref 3.5–5.3)
Sodium: 140 mmol/L (ref 135–146)

## 2016-01-16 LAB — BRAIN NATRIURETIC PEPTIDE: Brain Natriuretic Peptide: 36.6 pg/mL (ref <100)

## 2016-01-16 MED ORDER — FUROSEMIDE 20 MG PO TABS: 40.0000 mg | ORAL_TABLET | ORAL | Status: DC

## 2016-01-16 MED ORDER — POTASSIUM CHLORIDE CRYS ER 20 MEQ PO TBCR: 20.0000 meq | EXTENDED_RELEASE_TABLET | Freq: Every day | ORAL | Status: DC

## 2016-01-16 NOTE — Telephone Encounter (Signed)
Pt notified of lab results by phone with verbal understanding.  

## 2016-01-16 NOTE — Progress Notes (Signed)
Cardiology Office Note:    Date:  01/16/2016   ID:  [name], DOB 1935/09/04, MRN [id number]  PCP:  [name]., MD  Cardiologist:  Dr. Verdis Prime   Electrophysiologist:  n/a  Referring MD: Andi Devon, MD   Chief Complaint  Patient presents with  . Hospitalization Follow-up    s/p PCI    History of Present Illness:     [name] is a 80 y.o. female with a hx of CAD status post prior LAD and circumflex stenting with DES, HTN, diastolic HF, PAF, HL. She has a prior history of Cypher DES to the LAD. She underwent PCI in 3/16 with resolute DES to the LAD in-stent restenosis.  Patient presented to the office 12/29/15 with chest pain and severe dyspnea. She was admitted with unstable angina and acute on chronic diastolic CHF 4/28-5/2. She was diuresed with IV Lasix. LHC demonstrated severe RCA stenosis which was treated with a DES. LAD and circumflex stents were patent.   Returns for follow-up. Here with her husband. Overall, she has been doing well since discharge. She still feels somewhat weak. She has some mild dyspnea with exertion. Denies orthopnea or PND. She has chronic LE edema. Her left leg is always bigger than her right. She denies any further chest discomfort. Denies syncope. Denies any bleeding issues. She does describe a long history of postprandial dizziness and fatigue. Previous hemoglobin A1c 6.4.   Past Medical History  Diagnosis Date  . Coronary artery disease   . Hypertension   . Anxiety   . Fibromyalgia   . Dysrhythmia     Palpitations, possible atrial fibrillation  . Vertigo 08/27/2006  . Ventricular hypertrophy   . High cholesterol   . Anginal pain (HCC)   . Exertional shortness of breath   . History of stomach ulcers     Remotely  . GERD (gastroesophageal reflux disease)   . Arthritis     Multiple joints  . Osteoporosis   . Acute myocardial infarction, unspecified site, initial episode of care   . Acute  myocardial infarction of other lateral wall, initial episode of care   . Chest pain 12/2015  . Chronic diastolic CHF (congestive heart failure) (HCC) 01/16/2016    Past Surgical History  Procedure Laterality Date  . Hernia repair    . Laparotomy      "day after oophorectomy; had bowel obstruction" (01/27/2013)  . Coronary angioplasty with stent placement  06/2005; 01/27/2013     2.515 mm Cypher stent to LAD in 2006; 2.75 x 12 Promus DES to the circumflex 2014    . Cardiac catheterization  10/2006    [name] 08/03/2007 (01/27/2013)  . Tonsillectomy and adenoidectomy  ~ 1952  . Abdominal hysterectomy  1970's  . Laparoscopic salpingo oopherectomy  2000  . Diagnostic laparoscopy  2000    lap abdominal lysis of adhesions  . Breast lumpectomy Right 1970's    benign   . Breast biopsy Right 1970's  . Left heart catheterization with coronary angiogram N/A 01/27/2013    Procedure: LEFT HEART CATHETERIZATION WITH CORONARY ANGIOGRAM;  Surgeon: Corky Crafts, MD;  Location: [facility] CATH LAB;  Service: Cardiovascular;  Laterality: N/A;  . Left heart catheterization with coronary angiogram N/A 11/10/2014    Procedure: LEFT HEART CATHETERIZATION WITH CORONARY ANGIOGRAM;  Surgeon: Lennette Bihari, MD; left main okay, LAD 75% ISR, FFR 0.75, s/p Angiosculpt scoring balloon and 2.7518 mm Resolute DES, CFX 70%, stent patent, RCA 50%  . Esophagogastroduodenoscopy N/A  11/10/2014    Procedure: ESOPHAGOGASTRODUODENOSCOPY (EGD);  Surgeon: Jeani HawkingPatrick Hung, MD;  [facility];  Service: Endoscopy;  Laterality: N/A;  . Cardiac catheterization N/A 01/01/2016    Procedure: Left Heart Cath and Coronary Angiography;  Surgeon: Kathleene Hazelhristopher D McAlhany, MD;  [facility];  Service: Cardiovascular;  Laterality: N/A;  . Cardiac catheterization N/A 01/01/2016    Procedure: Coronary Stent Intervention;  Surgeon: Kathleene Hazelhristopher D McAlhany, MD;  [facility] CV LAB;  Service: Cardiovascular;  Laterality: N/A;     Current Medications: Outpatient Prescriptions Prior to Visit  Medication Sig Dispense Refill  . acetaminophen (TYLENOL) 500 MG tablet Take 500 mg by mouth daily as needed (pain).    [name]. albuterol (PROVENTIL HFA;VENTOLIN HFA) 108 (90 BASE) MCG/ACT inhaler Inhale 2 puffs into the lungs every 6 (six) hours as needed for wheezing. 1 Inhaler 3  . ALPRAZolam (XANAX) 0.25 MG tablet Take 0.25 mg by mouth 2 (two) times daily as needed for anxiety. anxiety    . aspirin EC 81 MG tablet Take 81 mg by mouth daily.      . cetirizine (ZYRTEC) 10 MG tablet Take 10 mg by mouth daily.    . clopidogrel (PLAVIX) 75 MG tablet Take 1 tablet (75 mg total) by mouth daily. 30 tablet 11  . conjugated estrogens (PREMARIN) vaginal cream Place 1 Applicatorful vaginally daily as needed (dryness). Reported on 12/29/2015    . Diphenhydramine-APAP, sleep, (TYLENOL PM EXTRA STRENGTH PO) Take 1 tablet by mouth at bedtime as needed. For sleep    . fluticasone (FLONASE) 50 MCG/ACT nasal spray Place 2 sprays into both nostrils daily. 1 g 2  . HYDROcodone-acetaminophen (NORCO/VICODIN) 5-325 MG per tablet Take 0.5-1 tablets by mouth every 6 (six) hours as needed for moderate pain. 20 tablet 0  . lidocaine (LIDODERM) 5 % Place 1 patch onto the skin daily as needed (pain).     [name]. loratadine (CLARITIN) 10 MG tablet Take 10 mg by mouth daily. Reported on 12/29/2015    . LYRICA 75 MG capsule Take 75 mg by mouth 2 (two) times daily.    . metoprolol tartrate (LOPRESSOR) 25 MG tablet Take 0.5 tablets (12.5 mg total) by mouth 2 (two) times daily. 60 tablet 2  . nitroGLYCERIN (NITROSTAT) 0.4 MG SL tablet Place 1 tablet (0.4 mg total) under the tongue every 5 (five) minutes as needed for chest pain (CP or SOB). 25 tablet 2  . pantoprazole (PROTONIX) 40 MG tablet Take 1 tablet (40 mg total) by mouth daily at 6 (six) AM. 30 tablet 1  . Pitavastatin Calcium (LIVALO) 2 MG TABS Take 1 tablet (2 mg total) by mouth daily.    .  valsartan-hydrochlorothiazide (DIOVAN-HCT) 160-12.5 MG per tablet Take 1 tablet by mouth daily.     . Vitamin D, Ergocalciferol, (DRISDOL) 50000 UNITS CAPS capsule Take 50,000 Units by mouth every 7 (seven) days. Take on Mondays    . zolpidem (AMBIEN) 10 MG tablet Take 5 mg by mouth daily as needed for sleep. Reported on 12/29/2015    . furosemide (LASIX) 20 MG tablet Take 10 mg by mouth daily.     . potassium chloride SA (K-DUR,KLOR-CON) 20 MEQ tablet Take 1.5 tablets (30 mEq total) by mouth daily. 45 tablet 5  . Coenzyme Q10 (COQ-10) 100 MG CAPS Take 100 mg by mouth daily. Reported on 01/16/2016    . docusate sodium (COLACE) 100 MG capsule Take 1 capsule (100 mg total) by mouth 2 (two) times daily as  needed for mild constipation. (Patient not taking: Reported on 12/29/2015) 60 capsule 0   No facility-administered medications prior to visit.      Allergies:   Adhesive; Codeine; and Elavil   Social History   Social History  . Marital Status: Married    Spouse Name: N/A  . Number of Children: 3  . Years of Education: masters   Occupational History  . retired    Social History Main Topics  . Smoking status: Former Smoker -- 0.12 packs/day for 36 years    Types: Cigarettes    Quit date: 12/02/1991  . Smokeless tobacco: Never Used  . Alcohol Use: No  . Drug Use: No  . Sexual Activity: Not Currently    Birth Control/ Protection: Surgical   Other Topics Concern  . None   Social History Narrative     Family History:  The patient's family history includes Other in her father and mother.   ROS:   Please see the history of present illness.    ROS All other systems reviewed and are negative.   Physical Exam:    VS:  BP 112/78 mmHg  Pulse 72  Ht 5\' 7"  (1.702 m)  Wt 222 lb 9.6 oz (100.971 kg)  BMI 34.86 kg/m2  SpO2 96%   GEN: Well nourished, well developed, in no acute distress HEENT: normal Neck: no JVD, no masses Cardiac: Normal S1/S2, RRR; no murmurs, rubs, or gallops,  trace-1+ bilateral LE edema;  R groin without hematoma or bruit    Respiratory:  clear to auscultation bilaterally; no wheezing, rhonchi or rales GI: soft, nontender, nondistended MS: no deformity or atrophy Skin: warm and dry Neuro: No focal deficits  Psych: Alert and oriented x 3, normal affect  Wt Readings from Last 3 Encounters:  01/16/16 222 lb 9.6 oz (100.971 kg)  01/02/16 218 lb 4.1 oz (99 kg)  12/29/15 221 lb 6.4 oz (100.426 kg)      Studies/Labs Reviewed:     EKG:  EKG is  ordered today.  The ekg ordered today demonstrates NSR, HR 79, normal axis, QTc 408 ms  Recent Labs: 12/29/2015: B Natriuretic Peptide 30.6; Magnesium 2.2 01/02/2016: BUN 25*; Creatinine, Ser 1.29*; Hemoglobin 12.7; Platelets 232; Potassium 4.6; Sodium 139   Recent Lipid Panel    Component Value Date/Time   CHOL 271* 12/30/2015 0000   TRIG 90 12/30/2015 0000   HDL 49 12/30/2015 0000   CHOLHDL 5.5 12/30/2015 0000   VLDL 18 12/30/2015 0000   LDLCALC 204* 12/30/2015 0000    Additional studies/ records that were reviewed today include:   LHC 01/01/16 LM 20% LAD proximal 30%, distal stent patent with 10% ISR, D1 70% LCx ostial 60%, stent patent RCA ostial 90%, 50%, mid 40% EF 55-65% PCI 2.5 x 20 mm Promus premier DES to the ostial RCA 1. Triple vessel CAD with unstable angina. Patent stents mid LAD and Circumflex 2. Severe stenosis ostium RCA.  3. Normal LV systolic function.  4. Successful PTCA/DES x 1 ostium RCA  Echo 12/04/15 EF 55-60%, PASP 31 mmHg   ASSESSMENT:     1. Coronary artery disease involving native coronary artery of native heart without angina pectoris   2. Chronic diastolic CHF (congestive heart failure) (HCC)   3. Essential hypertension   4. Hyperlipidemia   5. Dizziness     PLAN:     In order of problems listed above:  1. CAD -  Status post admission with unstable angina treated with DES to  the ostial RCA. She does have moderate ostial LCx disease to be tx  medically. LCx and LAD stents were patent. She has been on Plavix for quite some time. She understands the importance of dual antiplatelet therapy. Continue aspirin, Plavix, beta blocker, ARB.  Refer to [facility].   2. Chronic diastolic CHF - She does have some mild evidence of volume overload. Weight has steadily increased since discharge. I have asked her to adjust her Lasix to 40 mg every Monday, Wednesday, Friday. She will continue on Lasix 20 mg all other days. She will take an extra potassium on her higher dose Lasix days. Check a BMET, BNP today. Repeat BMET in one week.  3. HTN - Blood pressure is controlled.  4. HL -  She is intolerant to statin therapy. LDL recently was over 200. Refer to lipid clinic for consideration of PCSK-9 inhibitor therapy.  5. Dizziness -  She describes postprandial lightheadedness and lethargy. This sounds concerning for either hypo-or hyperglycemia. She sees her primary care doctor soon and will address this with her.    Medication Adjustments/Labs and Tests Ordered: Current medicines are reviewed at length with the patient today.  Concerns regarding medicines are outlined above.  Medication changes, Labs and Tests ordered today are outlined in the Patient Instructions noted below. Patient Instructions  Medication Instructions:  1. INCREASE LASIX TO 40 MG EVERY Monday, WED and FRI'S  2. CONTINUE POTASSIUM EVERY DAY WITH THE EXCEPTION ON MON, WED and FRI'S YOU WILL TAKE 40 MEQ OF POTASSIUM   Labwork: 1. TODAY BMET, BNP 2. BMET TO BE DONE IN 1 WEEK DUE TO MED CHANGES Testing/Procedures: NONE Follow-Up: DR. Katrinka Blazing IN 3 MONTHS Any Other Special Instructions Will Be Listed Below (If Applicable). 1. YOU ARE BEING REFERRED TO CARDIAC REHAB AT [facility] [place] 2. YOU ARE BEING REFERRED TO THE LIPID CLINIC If you need a refill on your cardiac medications before your next appointment, please call your pharmacy.    Signed, Tereso Newcomer, PA-C  01/16/2016 12:41 PM    [facility] [address] [place], [place], [place]  [address] Phone: [phone]; Fax: [phone]

## 2016-01-16 NOTE — Patient Instructions (Addendum)
Medication Instructions:  1. INCREASE LASIX TO 40 MG EVERY Monday, WED and FRI'S  2. CONTINUE POTASSIUM EVERY DAY WITH THE EXCEPTION ON MON, WED and FRI'S YOU WILL TAKE 40 MEQ OF POTASSIUM   Labwork: 1. TODAY BMET, BNP 2. BMET TO BE DONE IN 1 WEEK DUE TO MED CHANGES Testing/Procedures: NONE Follow-Up: DR. Katrinka BlazingSMITH IN 3 MONTHS Any Other Special Instructions Will Be Listed Below (If Applicable). 1. YOU ARE BEING REFERRED TO CARDIAC REHAB AT [facility] 2. YOU ARE BEING REFERRED TO THE LIPID CLINIC If you need a refill on your cardiac medications before your next appointment, please call your pharmacy.

## 2016-01-23 ENCOUNTER — Ambulatory Visit (INDEPENDENT_AMBULATORY_CARE_PROVIDER_SITE_OTHER): Payer: Medicare Other | Admitting: Pharmacist

## 2016-01-23 ENCOUNTER — Other Ambulatory Visit (INDEPENDENT_AMBULATORY_CARE_PROVIDER_SITE_OTHER): Payer: Medicare Other | Admitting: *Deleted

## 2016-01-23 DIAGNOSIS — I5032 Chronic diastolic (congestive) heart failure: Secondary | ICD-10-CM

## 2016-01-23 DIAGNOSIS — I2510 Atherosclerotic heart disease of native coronary artery without angina pectoris: Secondary | ICD-10-CM

## 2016-01-23 DIAGNOSIS — E785 Hyperlipidemia, unspecified: Secondary | ICD-10-CM

## 2016-01-23 LAB — BASIC METABOLIC PANEL
BUN: 19 mg/dL (ref 7–25)
CALCIUM: 9.1 mg/dL (ref 8.6–10.4)
CO2: 29 mmol/L (ref 20–31)
CREATININE: 1.02 mg/dL — AB (ref 0.60–0.93)
Chloride: 104 mmol/L (ref 98–110)
GLUCOSE: 89 mg/dL (ref 65–99)
Potassium: 4.4 mmol/L (ref 3.5–5.3)
Sodium: 139 mmol/L (ref 135–146)

## 2016-01-23 MED ORDER — ROSUVASTATIN CALCIUM 10 MG PO TABS: 10.0000 mg | ORAL_TABLET | Freq: Every day | ORAL | Status: DC

## 2016-01-23 NOTE — Patient Instructions (Signed)
Start taking Crestor 10mg  three times a week. You may pick any three days out of the week (allow at least one day between doses)  If you can tolerate this, increase to four tablets per week after two weeks. If you experience any pain or issues reduce you dose back to the last tolerated.    If you cannot tolerate three doses a week, please call our clinic directly [phone] and we can do further  We will schedule a fasting lipid panel for you in 3 months.

## 2016-01-23 NOTE — Progress Notes (Signed)
Patient ID: [name]                 DOB: 11-16-1935                    MRN: [id number]     HPI: [name] is a 80 y.o. female patient of Dr. Katrinka Blazing referred to lipid clinic by Tereso Newcomer, PA-C. PMH significant for CAD s/p DES 12/2015, unstable angina, Afib not on anticoag, HFpEF, HTN, and HLD. Presents today with her husband. Pt concerned with the results of her last lipid panel; especially in light of her recent revascularization. Reports intolerance to multiple statins; most recently pitavastatin which she subsequently stopped. Crestor and Zocor prescribed >5 years ago per pt on which she developed myalgias. Unsure of doses used. Was on atorvastatin at multiple multiple strengths in 2014 and again developed myalgias. Pitavastatin caused her to "just feel bad". Also tried CoQ-10 to help with myalgias but was ineffective. Has used Zetia in the past and tolerated well.   Current Medications: None Intolerances: Atorvastatin , , and , rosuvastatin, simvastatin - myalgias; pitavastatin   "feels bad" Risk Factors: ASCVD, LDL >190 mg/dL LDL goal: <40 mg/dL  05/8118 Lipid Panel (no therapy): TC 271, TG 90, HDL 49, LDL 204  Past Medical History  Diagnosis Date  . Coronary artery disease   . Hypertension   . Anxiety   . Fibromyalgia   . Dysrhythmia     Palpitations, possible atrial fibrillation  . Vertigo 08/27/2006  . Ventricular hypertrophy   . High cholesterol   . Anginal pain (HCC)   . Exertional shortness of breath   . History of stomach ulcers     Remotely  . GERD (gastroesophageal reflux disease)   . Arthritis     Multiple joints  . Osteoporosis   . Acute myocardial infarction, unspecified site, initial episode of care   . Acute myocardial infarction of other lateral wall, initial episode of care   . Chest pain 12/2015  . Chronic diastolic CHF (congestive heart failure) (HCC) 01/16/2016    Current Outpatient Prescriptions on File Prior to  Visit  Medication Sig Dispense Refill  . acetaminophen (TYLENOL) 500 MG tablet Take 500 mg by mouth daily as needed (pain).    [name] albuterol (PROVENTIL HFA;VENTOLIN HFA) 108 (90 BASE) MCG/ACT inhaler Inhale 2 puffs into the lungs every 6 (six) hours as needed for wheezing. 1 Inhaler 3  . ALPRAZolam (XANAX) 0.25 MG tablet Take 0.25 mg by mouth 2 (two) times daily as needed for anxiety. anxiety    . aspirin EC 81 MG tablet Take 81 mg by mouth daily.      . cetirizine (ZYRTEC) 10 MG tablet Take 10 mg by mouth daily.    . clopidogrel (PLAVIX) 75 MG tablet Take 1 tablet (75 mg total) by mouth daily. 30 tablet 11  . conjugated estrogens (PREMARIN) vaginal cream Place 1 Applicatorful vaginally daily as needed (dryness). Reported on 12/29/2015    . Diphenhydramine-APAP, sleep, (TYLENOL PM EXTRA STRENGTH PO) Take 1 tablet by mouth at bedtime as needed. For sleep    . fluticasone (FLONASE) 50 MCG/ACT nasal spray Place 2 sprays into both nostrils daily. 1 g 2  . furosemide (LASIX) 20 MG tablet Take 2 tablets (40 mg total) by mouth every Monday, Wednesday, and Friday. 90 tablet 3  . HYDROcodone-acetaminophen (NORCO/VICODIN) 5-325 MG per tablet Take 0.5-1 tablets by mouth every 6 (six) hours as needed for moderate pain. 20 tablet  0  . lidocaine (LIDODERM) 5 % Place 1 patch onto the skin daily as needed (pain).     [name]. loratadine (CLARITIN) 10 MG tablet Take 10 mg by mouth daily. Reported on 12/29/2015    . LYRICA 75 MG capsule Take 75 mg by mouth 2 (two) times daily.    . metoprolol tartrate (LOPRESSOR) 25 MG tablet Take 0.5 tablets (12.5 mg total) by mouth 2 (two) times daily. 60 tablet 2  . nitroGLYCERIN (NITROSTAT) 0.4 MG SL tablet Place 1 tablet (0.4 mg total) under the tongue every 5 (five) minutes as needed for chest pain (CP or SOB). 25 tablet 2  . pantoprazole (PROTONIX) 40 MG tablet Take 1 tablet (40 mg total) by mouth daily at 6 (six) AM. 30 tablet 1  . potassium chloride SA (K-DUR,KLOR-CON) 20 MEQ tablet  Take 1 tablet (20 mEq total) by mouth daily. 90 tablet 3  . valsartan-hydrochlorothiazide (DIOVAN-HCT) 160-12.5 MG per tablet Take 1 tablet by mouth daily.     . Vitamin D, Ergocalciferol, (DRISDOL) 50000 UNITS CAPS capsule Take 50,000 Units by mouth every 7 (seven) days. Take on Mondays    . zolpidem (AMBIEN) 10 MG tablet Take 5 mg by mouth daily as needed for sleep. Reported on 12/29/2015    . [DISCONTINUED] ezetimibe (ZETIA) 10 MG tablet Take 10 mg by mouth daily.      . [DISCONTINUED] metoprolol (TOPROL-XL) 50 MG 24 hr tablet Take 50 mg by mouth daily.      . [DISCONTINUED] potassium chloride (KLOR-CON) 20 MEQ packet Take 20 mEq by mouth daily.       No current facility-administered medications on file prior to visit.    Allergies  Allergen Reactions  . Adhesive [Tape] Itching and Rash  . Codeine Itching    Tolerates low dose of norco  . Elavil [Amitriptyline] Other (See Comments)    Dissociation    Assessment/Plan:  Hyperlipidemia: LDL 204 currently above goal of <70 mg/dL. Discussed different lipid management options including PCSK-9 inhibitors at length with pt. Concerned with potentially high copay costs of PCSK-9 therapy. Pt willing to retry rosuvastatin at 10mg  3x weekly. If able to tolerate long term, plan to add Zetia at a future visit. If unable to tolerate, will pursue approval for PCSK-9 inhibitor. Pt instructed to call clinic if she experiences any myalgias or other issues with rosuvastatin. Will schedule follow up appointment pending tolerance.  Sherle Poeob Vincent, PharmD Clinical Pharmacy Resident 1:15 PM, 01/23/2016

## 2016-02-21 ENCOUNTER — Telehealth (HOSPITAL_COMMUNITY): Payer: Self-pay | Admitting: *Deleted

## 2016-02-21 ENCOUNTER — Telehealth: Payer: Self-pay | Admitting: Interventional Cardiology

## 2016-02-21 NOTE — Telephone Encounter (Signed)
Pt to have cardiac rehab 04-2016.  Pt completed outpt cardiac rehab 03/2015.  This was for a different event.  This will not be an extension of previous rehab.  Per [facility], Tollie Pizza, [phone], policy is on calendar year, 09-03-15 to 09-01-16.  04/2016 will not be in same policy year as 03/9431.  No precert required.  Pt has no deductible, out of pocket %,700 of which $438.96 has been met and 20% co insurance.  Pt also has [place], [phone], per Marcene Corning, phone call [phone], calendar year policy, 09-09-38 to 37-54-36.  Pt has $1,080 deductible of which $300 met, out of pocket $4,300, 30% co insurance.  No precert required.  Plan also not in same policy as stated for [facility].

## 2016-02-21 NOTE — Telephone Encounter (Signed)
-----   Message from Britt Bologneseharmaine M Hall sent at 02/21/2016 11:37 AM EDT ----- Regarding: RE: ? authorization for Cardiac rehab See phone note.  The reason that other case was denied is not because it was past 36 sessions for "life" it was because pt had rehab twice in same policy year, even though 2 different events.    Pt should be good to do rehab in aug 2017 since not related to original dx. It is a subsequent event.  PCI  ----- Message -----    From: Britt Bologneseharmaine M Hall    Sent: 02/21/2016      To: Britt Bologneseharmaine M Hall Subject: FW: ? authorization for Cardiac rehab            ----- Message -----    From: Chelsea Ausarlette B Carlton, RN    Sent: 02/19/2016   5:17 PM      To: Britt Bologneseharmaine M Hall Subject: ? authorization for Cardiac rehab              Hi Charmaine,  The above pt of Dr. Katrinka BlazingSmith is referred to cardiac s/p 5/1 DES to RCA.  Pt is a prior participant in cardiac rehab phase II in 2016.  Pt has [employer]. I remember in the past getting a denial for a previous participant who had [employer] Medicare because we did not get authorization to go beyond 36 sessions for life.  Please check to see if this is still the policy.  Scheduled to attend first day of exercise 04/16/16.  Thanks so much  PepsiCoCarlette Carlton

## 2016-03-19 ENCOUNTER — Encounter: Payer: Self-pay | Admitting: Physician Assistant

## 2016-04-10 ENCOUNTER — Other Ambulatory Visit: Payer: Self-pay | Admitting: Cardiology

## 2016-04-10 NOTE — Telephone Encounter (Signed)
Ok to refill under Dr Smith? 

## 2016-04-16 ENCOUNTER — Encounter (HOSPITAL_COMMUNITY)
Admission: RE | Admit: 2016-04-16 | Discharge: 2016-04-16 | Disposition: A | Discharge status: Home / Self Care | Payer: Medicare Other | Source: Ambulatory Visit | Attending: Interventional Cardiology | Admitting: Interventional Cardiology

## 2016-04-16 VITALS — BP 126/76 | HR 68 | Ht 66.0 in | Wt 224.2 lb

## 2016-04-16 DIAGNOSIS — Z955 Presence of coronary angioplasty implant and graft: Secondary | ICD-10-CM | POA: Insufficient documentation

## 2016-04-16 NOTE — Progress Notes (Signed)
Cardiac Rehab Medication Review by a Pharmacist  Does the patient  feel that his/her medications are working for him/her?  yes  Has the patient been experiencing any side effects to the medications prescribed?  no  Does the patient measure his/her own blood pressure or blood glucose at home?  yes   Does the patient have any problems obtaining medications due to transportation or finances?   no  Understanding of regimen: good Understanding of indications: good Potential of compliance: good    Pharmacist comments: Pt presents for cardiac rehab visit. Pt reports having a syncopal episode while driving that she attributes to an MSG allergy, she stated that she will follow-up with her PCP about this issue and possibly enroll in a sleep study. Otherwise no issues noted.   Fredonia HighlandMichael Abdallah Hern, PharmD PGY-1 Pharmacy Resident Pager: [phone] 04/16/2016

## 2016-04-17 ENCOUNTER — Encounter (HOSPITAL_COMMUNITY): Payer: Self-pay

## 2016-04-17 NOTE — Progress Notes (Signed)
Cardiac Individual Treatment Plan  Patient Details  Name: Lilliauna Van MRN: [id number] Date of Birth: [birth year] Referring Provider:   Flowsheet Row CARDIAC REHAB PHASE II EXERCISE from 04/16/2016 in [facility] CARDIAC [facility]  Referring Provider  Verdis Prime iii      Initial Encounter Date:  Flowsheet Row CARDIAC REHAB PHASE II EXERCISE from 04/16/2016 in [facility] CARDIAC REHAB  Date  04/16/16  Referring Provider  Verdis Prime iii      Visit Diagnosis: Status post coronary artery stent placement  Patient's Home Medications on Admission:  Current Outpatient Prescriptions:  .  acetaminophen (TYLENOL) 500 MG tablet, Take 500 mg by mouth daily as needed (pain)., Disp: , Rfl:  .  albuterol (PROVENTIL HFA;VENTOLIN HFA) 108 (90 BASE) MCG/ACT inhaler, Inhale 2 puffs into the lungs every 6 (six) hours as needed for wheezing., Disp: 1 Inhaler, Rfl: 3 .  ALPRAZolam (XANAX) 0.25 MG tablet, Take 0.25 mg by mouth 2 (two) times daily as needed for anxiety. anxiety, Disp: , Rfl:  .  aspirin EC 81 MG tablet, Take 81 mg by mouth daily.  , Disp: , Rfl:  .  cetirizine (ZYRTEC) 10 MG tablet, Take 10 mg by mouth daily., Disp: , Rfl:  .  clopidogrel (PLAVIX) 75 MG tablet, Take 1 tablet (75 mg total) by mouth daily., Disp: 30 tablet, Rfl: 11 .  Diphenhydramine-APAP, sleep, (TYLENOL PM EXTRA STRENGTH PO), Take 1 tablet by mouth at bedtime as needed. For sleep, Disp: , Rfl:  .  fluticasone (FLONASE) 50 MCG/ACT nasal spray, Place 2 sprays into both nostrils daily., Disp: 1 g, Rfl: 2 .  lidocaine (LIDODERM) 5 %, Place 1 patch onto the skin daily as needed (pain). , Disp: , Rfl:  .  loratadine (CLARITIN) 10 MG tablet, Take 10 mg by mouth daily. Reported on 12/29/2015, Disp: , Rfl:  .  LYRICA 75 MG capsule, Take 75 mg by mouth at bedtime. , Disp: , Rfl:  .  metoprolol tartrate (LOPRESSOR) 25 MG tablet, Take 0.5 tablets (12.5 mg total) by mouth 2 (two) times daily.,  Disp: 60 tablet, Rfl: 2 .  nitroGLYCERIN (NITROSTAT) 0.4 MG SL tablet, Place 1 tablet (0.4 mg total) under the tongue every 5 (five) minutes as needed for chest pain (CP or SOB)., Disp: 25 tablet, Rfl: 2 .  pantoprazole (PROTONIX) 40 MG tablet, Take 1 tablet (40 mg total) by mouth daily at 6 (six) AM., Disp: 30 tablet, Rfl: 1 .  potassium chloride SA (K-DUR,KLOR-CON) 20 MEQ tablet, Take 1 tablet (20 mEq total) by mouth daily., Disp: 90 tablet, Rfl: 3 .  rosuvastatin (CRESTOR) 10 MG tablet, Take 1 tablet (10 mg total) by mouth daily. (Patient taking differently: Take 10 mg by mouth daily. Take 1 tablet by mouth four times weekly.), Disp: 30 tablet, Rfl: 2 .  torsemide (DEMADEX) 20 MG tablet, Take 20 mg by mouth daily. Take 1 to 2 tablets by mouth daily as needed for swelling., Disp: , Rfl:  .  valsartan-hydrochlorothiazide (DIOVAN-HCT) 160-12.5 MG per tablet, Take 1 tablet by mouth daily. , Disp: , Rfl:  .  Vitamin D, Ergocalciferol, (DRISDOL) 50000 UNITS CAPS capsule, Take 50,000 Units by mouth every 7 (seven) days. Take on Mondays, Disp: , Rfl:  .  conjugated estrogens (PREMARIN) vaginal cream, Place 1 Applicatorful vaginally daily as needed (dryness). Reported on 12/29/2015, Disp: , Rfl:  .  furosemide (LASIX) 20 MG tablet, Take 2 tablets (40 mg total) by mouth every Monday, Wednesday,  and Friday. (Patient not taking: Reported on 04/16/2016), Disp: 90 tablet, Rfl: 3 .  HYDROcodone-acetaminophen (NORCO/VICODIN) 5-325 MG per tablet, Take 0.5-1 tablets by mouth every 6 (six) hours as needed for moderate pain., Disp: 20 tablet, Rfl: 0 .  zolpidem (AMBIEN) 10 MG tablet, Take 5 mg by mouth daily as needed for sleep. Reported on 12/29/2015, Disp: , Rfl:   Past Medical History: Past Medical History:  Diagnosis Date  . Acute myocardial infarction of other lateral wall, initial episode of care   . Acute myocardial infarction, unspecified site, initial episode of care   . Anginal pain (HCC)   . Anxiety   .  Arthritis    Multiple joints  . Chest pain 12/2015  . Chronic diastolic CHF (congestive heart failure) (HCC) 01/16/2016  . Coronary artery disease   . Dysrhythmia    Palpitations, possible atrial fibrillation  . Exertional shortness of breath   . Fibromyalgia   . GERD (gastroesophageal reflux disease)   . High cholesterol   . History of stomach ulcers    Remotely  . Hypertension   . Osteoporosis   . Ventricular hypertrophy   . Vertigo 08/27/2006    Tobacco Use: History  Smoking Status  . Former Smoker  . Packs/day: 0.12  . Years: 36.00  . Types: Cigarettes  . Quit date: 12/02/1991  Smokeless Tobacco  . Never Used    Labs: Recent Review Flowsheet Data    Labs for ITP Cardiac and Pulmonary Rehab Latest Ref Rng & Units 04/18/2009 04/07/2010 07/06/2011 11/08/2014 12/30/2015   Cholestrol 0 - 200 mg/dL 811271 ATP III CLASSIFICATION: <200     mg/dL   Desirable [id number]  mg/dL   Borderline High >=956>=240    mg/dL   High(H) - - - 213(Y271(H)   LDLCALC 0 - 99 mg/dL 865206 Total Cholesterol/HDL:CHD Risk Coronary Heart Disease Risk Table Men   Women 1/2 Average Risk   3.4   3.3 Average Risk       5.0   4.4 2 X Average Risk   9.6   7.1 3 X Average Risk  23.4   11.0 Use the calculated Patient Ratio above and the CHD Risk Table to determine the patient's CHD Risk. ATP III CLASSIFICATION (LDL): <100     mg/dL   Optimal [phone]  mg/dL   Near or Above Optimal 130-159  mg/dL   Borderline [id number]  mg/dL   High >132>190     mg/dL   Very High(H) - - - 440(N204(H)   HDL >40 mg/dL 42 - - - 49   Trlycerides <150 mg/dL 027113 - - - 90   Hemoglobin A1c 4.8 - 5.6 % - - - 6.4(H) -   PHART 7.350 - 7.400 - - - - -   PCO2ART 35.0 - 45.0 mmHg - - - - -   HCO3 20.0 - 24.0 mEq/L - - - - -   TCO2 0 - 100 mmol/L - 29 27 26  -   O2SAT % - - - - -      Capillary Blood Glucose: Lab Results  Component Value Date   GLUCAP 91 01/13/2015     Exercise Target Goals: Date: 04/16/16  Exercise Program Goal: Individual  exercise prescription set with THRR, safety & activity barriers. Participant demonstrates ability to understand and report RPE using BORG scale, to self-measure pulse accurately, and to acknowledge the importance of the exercise prescription.  Exercise Prescription Goal: Starting with aerobic activity 30 plus minutes a day, 3 days  per week for initial exercise prescription. Provide home exercise prescription and guidelines that participant acknowledges understanding prior to discharge.  Activity Barriers & Risk Stratification:   6 Minute Walk:     6 Minute Walk    Row Name 04/16/16 1645         6 Minute Walk   Phase Initial     Distance 1248 feet     Walk Time 6 minutes     # of Rest Breaks 0     MPH 2.36     METS 2.06     RPE 11     Perceived Dyspnea  1     VO2 Peak 7.2     Symptoms Yes (comment)     Comments Mild SOB toward end of walk test     Resting HR 68 bpm     Resting BP 126/76     Max Ex. HR 105 bpm     Max Ex. BP 142/72     2 Minute Post BP 112/72        Initial Exercise Prescription:     Initial Exercise Prescription - 04/16/16 1600      Date of Initial Exercise RX and Referring Provider   Date 04/16/16   Referring Provider Verdis Primesmith, henry iii     Treadmill   MPH 1.7   Minutes 10   METs 2.3     Recumbant Bike   Level 2   Watts 15   Minutes 10   METs 2.46     NuStep   Level 2   Minutes 10   METs 2     Prescription Details   Frequency (times per week) 3   Duration Progress to 30 minutes of continuous aerobic without signs/symptoms of physical distress     Intensity   THRR 40-80% of Max Heartrate 57-114   Ratings of Perceived Exertion 11-13   Perceived Dyspnea 0-4     Progression   Progression Continue to progress workloads to maintain intensity without signs/symptoms of physical distress.     Resistance Training   Training Prescription Yes   Weight 2lbs   Reps 10-12      Perform Capillary Blood Glucose checks as needed.  Exercise  Prescription Changes:   Exercise Comments:   Discharge Exercise Prescription (Final Exercise Prescription Changes):   Nutrition:  Target Goals: Understanding of nutrition guidelines, daily intake of sodium 1500mg , cholesterol 200mg , calories 30% from fat and 7% or less from saturated fats, daily to have 5 or more servings of fruits and vegetables.  Biometrics:     Pre Biometrics - 04/16/16 1643      Pre Biometrics   Height 5\' 6"  (1.676 m)   Weight 224 lb 3.3 oz (101.7 kg)   Waist Circumference 46 inches   Hip Circumference 46 inches   Waist to Hip Ratio 1 %   BMI (Calculated) 36.3   Triceps Skinfold 38 mm   % Body Fat 38.1 %   Grip Strength 27.5 kg   Flexibility 14.5 in   Single Leg Stand 2.75 seconds       Nutrition Therapy Plan and Nutrition Goals:   Nutrition Discharge: Nutrition Scores:   Nutrition Goals Re-Evaluation:   Psychosocial: Target Goals: Acknowledge presence or absence of depression, maximize coping skills, provide positive support system. Participant is able to verbalize types and ability to use techniques and skills needed for reducing stress and depression.  Initial Review & Psychosocial Screening:     Initial Psych Review &  Screening - 04/17/16 0830      Family Dynamics   Good Support System? Yes     Barriers   Psychosocial barriers to participate in program The patient should benefit from training in stress management and relaxation.     Screening Interventions   Interventions Encouraged to exercise      Quality of Life Scores:     Quality of Life - 04/16/16 1644      Quality of Life Scores   Health/Function Pre 18.8 %   Socioeconomic Pre 27 %   Psych/Spiritual Pre 25.71 %   Family Pre 20.4 %   GLOBAL Pre 22 %      PHQ-9: Recent Review Flowsheet Data    Depression screen [facility] 2/9 03/22/2015 12/21/2014 02/15/2013   Decreased Interest 0 0 1   Down, Depressed, Hopeless 0 0 1   PHQ - 2 Score 0 0 2   Altered sleeping - - 1    Tired, decreased energy - - 1   Change in appetite - - 0   Feeling bad or failure about yourself  - - 1   Trouble concentrating - - 1   Moving slowly or fidgety/restless - - 1   Suicidal thoughts - - 0   PHQ-9 Score - - 7      Psychosocial Evaluation and Intervention:   Psychosocial Re-Evaluation:   Vocational Rehabilitation: Provide vocational rehab assistance to qualifying candidates.   Vocational Rehab Evaluation & Intervention:   Education: Education Goals: Education classes will be provided on a weekly basis, covering required topics. Participant will state understanding/return demonstration of topics presented.  Learning Barriers/Preferences:   Education Topics: Count Your Pulse:  -Group instruction provided by verbal instruction, demonstration, patient participation and written materials to support subject.  Instructors address importance of being able to find your pulse and how to count your pulse when at home without a heart monitor.  Patients get hands on experience counting their pulse with staff help and individually.   Heart Attack, Angina, and Risk Factor Modification:  -Group instruction provided by verbal instruction, video, and written materials to support subject.  Instructors address signs and symptoms of angina and heart attacks.    Also discuss risk factors for heart disease and how to make changes to improve heart health risk factors.   Functional Fitness:  -Group instruction provided by verbal instruction, demonstration, patient participation, and written materials to support subject.  Instructors address safety measures for doing things around the house.  Discuss how to get up and down off the floor, how to pick things up properly, how to safely get out of a chair without assistance, and balance training.   Meditation and Mindfulness:  -Group instruction provided by verbal instruction, patient participation, and written materials to support  subject.  Instructor addresses importance of mindfulness and meditation practice to help reduce stress and improve awareness.  Instructor also leads participants through a meditation exercise.    Stretching for Flexibility and Mobility:  -Group instruction provided by verbal instruction, patient participation, and written materials to support subject.  Instructors lead participants through series of stretches that are designed to increase flexibility thus improving mobility.  These stretches are additional exercise for major muscle groups that are typically performed during regular warm up and cool down.   Hands Only CPR Anytime:  -Group instruction provided by verbal instruction, video, patient participation and written materials to support subject.  Instructors co-teach with AHA video for hands only CPR.  Participants get hands  on experience with mannequins.   Nutrition I class: Heart Healthy Eating:  -Group instruction provided by PowerPoint slides, verbal discussion, and written materials to support subject matter. The instructor gives an explanation and review of the Therapeutic Lifestyle Changes diet recommendations, which includes a discussion on lipid goals, dietary fat, sodium, fiber, plant stanol/sterol esters, sugar, and the components of a well-balanced, healthy diet.   Nutrition II class: Lifestyle Skills:  -Group instruction provided by PowerPoint slides, verbal discussion, and written materials to support subject matter. The instructor gives an explanation and review of label reading, grocery shopping for heart health, heart healthy recipe modifications, and ways to make healthier choices when eating out.   Diabetes Question & Answer:  -Group instruction provided by PowerPoint slides, verbal discussion, and written materials to support subject matter. The instructor gives an explanation and review of diabetes co-morbidities, pre- and post-prandial blood glucose goals, pre-exercise  blood glucose goals, signs, symptoms, and treatment of hypoglycemia and hyperglycemia, and foot care basics.   Diabetes Blitz:  -Group instruction provided by PowerPoint slides, verbal discussion, and written materials to support subject matter. The instructor gives an explanation and review of the physiology behind type 1 and type 2 diabetes, diabetes medications and rational behind using different medications, pre- and post-prandial blood glucose recommendations and Hemoglobin A1c goals, diabetes diet, and exercise including blood glucose guidelines for exercising safely.    Portion Distortion:  -Group instruction provided by PowerPoint slides, verbal discussion, written materials, and food models to support subject matter. The instructor gives an explanation of serving size versus portion size, changes in portions sizes over the last 20 years, and what consists of a serving from each food group.   Stress Management:  -Group instruction provided by verbal instruction, video, and written materials to support subject matter.  Instructors review role of stress in heart disease and how to cope with stress positively.     Exercising on Your Own:  -Group instruction provided by verbal instruction, power point, and written materials to support subject.  Instructors discuss benefits of exercise, components of exercise, frequency and intensity of exercise, and end points for exercise.  Also discuss use of nitroglycerin and activating EMS.  Review options of places to exercise outside of rehab.  Review guidelines for sex with heart disease.   Cardiac Drugs I:  -Group instruction provided by verbal instruction and written materials to support subject.  Instructor reviews cardiac drug classes: antiplatelets, anticoagulants, beta blockers, and statins.  Instructor discusses reasons, side effects, and lifestyle considerations for each drug class.   Cardiac Drugs II:  -Group instruction provided by verbal  instruction and written materials to support subject.  Instructor reviews cardiac drug classes: angiotensin converting enzyme inhibitors (ACE-I), angiotensin II receptor blockers (ARBs), nitrates, and calcium channel blockers.  Instructor discusses reasons, side effects, and lifestyle considerations for each drug class.   Anatomy and Physiology of the Circulatory System:  -Group instruction provided by verbal instruction, video, and written materials to support subject.  Reviews functional anatomy of heart, how it relates to various diagnoses, and what role the heart plays in the overall system.   Knowledge Questionnaire Score:     Knowledge Questionnaire Score - 04/16/16 1644      Knowledge Questionnaire Score   Pre Score 21/24      Core Components/Risk Factors/Patient Goals at Admission:     Personal Goals and Risk Factors at Admission - 04/16/16 1649      Core Components/Risk Factors/Patient Goals on Admission  Weight Management Weight Loss;Obesity;Yes   Intervention Weight Management: Develop a combined nutrition and exercise program designed to reach desired caloric intake, while maintaining appropriate intake of nutrient and fiber, sodium and fats, and appropriate energy expenditure required for the weight goal.;Weight Management/Obesity: Establish reasonable short term and long term weight goals.;Obesity: Provide education and appropriate resources to help participant work on and attain dietary goals.   Admit Weight 224 lb 3.3 oz (101.7 kg)   Goal Weight: Short Term 220 lb (99.8 kg)   Goal Weight: Long Term 218 lb (98.9 kg)   Expected Outcomes Weight Loss: Understanding of general recommendations for a balanced deficit meal plan, which promotes 1-2 lb weight loss per week and includes a negative energy balance of [phone] kcal/d;Short Term: Continue to assess and modify interventions until short term weight is achieved;Long Term: Adherence to nutrition and physical  activity/exercise program aimed toward attainment of established weight goal   Sedentary Yes   Intervention Provide advice, education, support and counseling about physical activity/exercise needs.;Develop an individualized exercise prescription for aerobic and resistive training based on initial evaluation findings, risk stratification, comorbidities and participant's personal goals.   Expected Outcomes Achievement of increased cardiorespiratory fitness and enhanced flexibility, muscular endurance and strength shown through measurements of functional capacity and personal statement of participant.   Hypertension Yes   Intervention Provide education on lifestyle modifcations including regular physical activity/exercise, weight management, moderate sodium restriction and increased consumption of fresh fruit, vegetables, and low fat dairy, alcohol moderation, and smoking cessation.;Monitor prescription use compliance.   Expected Outcomes Short Term: Continued assessment and intervention until BP is < 140/55mm HG in hypertensive participants. < 130/97mm HG in hypertensive participants with diabetes, heart failure or chronic kidney disease.;Long Term: Maintenance of blood pressure at goal levels.   Lipids Yes   Intervention Provide education and support for participant on nutrition & aerobic/resistive exercise along with prescribed medications to achieve LDL 70mg , HDL >40mg .   Expected Outcomes Short Term: Participant states understanding of desired cholesterol values and is compliant with medications prescribed. Participant is following exercise prescription and nutrition guidelines.;Long Term: Cholesterol controlled with medications as prescribed, with individualized exercise RX and with personalized nutrition plan. Value goals: LDL < 70mg , HDL > 40 mg.   Personal Goal Other Yes   Personal Goal Get fitness back to be able to do ADLs.   Intervention Provide individulaized aerobic and resistance training  program, including stretching, to improve functional fitness, so that patient can do ADLs.   Expected Outcomes Improve functional fitness through regular aerobic exercise 5-7 days per week.      Core Components/Risk Factors/Patient Goals Review:    Core Components/Risk Factors/Patient Goals at Discharge (Final Review):    ITP Comments:     ITP Comments    Row Name 04/16/16 1416           ITP Comments Medical Director- Dr. Armanda Magic          Comments: Patient attended orientation from 1330 to 1530 to review rules and guidelines for program. Completed 6 minute walk test, Intitial ITP, and exercise prescription.  VSS. Telemetry-sinus rhythm.  Asymptomatic.

## 2016-04-22 ENCOUNTER — Encounter (HOSPITAL_COMMUNITY)
Admission: RE | Admit: 2016-04-22 | Discharge: 2016-04-22 | Disposition: A | Discharge status: Home / Self Care | Payer: Medicare Other | Source: Ambulatory Visit | Attending: Interventional Cardiology | Admitting: Interventional Cardiology

## 2016-04-22 DIAGNOSIS — Z955 Presence of coronary angioplasty implant and graft: Secondary | ICD-10-CM | POA: Diagnosis not present

## 2016-04-22 NOTE — Progress Notes (Signed)
Daily Session Note  Patient Details  Name: [name] MRN: [id number] Date of Birth: [birth year] Referring Provider:   Flowsheet Row CARDIAC REHAB PHASE II EXERCISE from 04/16/2016 in [place]  Referring Provider  Daneen Schick iii      Encounter Date: 04/22/2016  Check In:   Capillary Blood Glucose: No results found for this or any previous visit (from the past 24 hour(s)).   Goals Met:  Exercise tolerated well  Goals Unmet:  Not Applicable  Comments: Pt started cardiac rehab today.  Pt tolerated light exercise without difficulty. VSS, telemetry-Sinus Rhythm, asymptomatic.  Medication list reconciled. Pt denies barriers to medicaiton compliance.  PSYCHOSOCIAL ASSESSMENT:  PHQ-1. Pt exhibits positive coping skills, hopeful outlook with supportive family. No psychosocial needs identified at this time, no psychosocial interventions necessary. Mrs Tindol is taking care of her husband, Mallie Mussel who has  demetia. Pt enjoys reading and crocheting.   Pt oriented to exercise equipment and routine.    Understanding verbalized. Kendrick Fries hopes cardiac rehab will give her more energy. Barnet Pall, RN,BSN 04/22/2016 12:03 PM   Dr. Fransico Him is Medical Director for Cardiac Rehab at [facility].

## 2016-04-23 ENCOUNTER — Ambulatory Visit: Payer: Medicare Other | Admitting: Interventional Cardiology

## 2016-04-23 ENCOUNTER — Other Ambulatory Visit: Payer: Self-pay | Admitting: Cardiology

## 2016-04-24 ENCOUNTER — Encounter: Payer: Self-pay | Admitting: Interventional Cardiology

## 2016-04-24 ENCOUNTER — Other Ambulatory Visit: Payer: Self-pay

## 2016-04-24 ENCOUNTER — Encounter (HOSPITAL_COMMUNITY)
Admission: RE | Admit: 2016-04-24 | Discharge: 2016-04-24 | Disposition: A | Discharge status: Home / Self Care | Payer: Medicare Other | Source: Ambulatory Visit | Attending: Interventional Cardiology | Admitting: Interventional Cardiology

## 2016-04-24 DIAGNOSIS — Z955 Presence of coronary angioplasty implant and graft: Secondary | ICD-10-CM

## 2016-04-24 MED ORDER — PANTOPRAZOLE SODIUM 40 MG PO TBEC
40.0000 mg | DELAYED_RELEASE_TABLET | Freq: Every day | ORAL | 6 refills | Status: DC
Start: 2016-04-24 — End: 2016-11-19

## 2016-04-26 ENCOUNTER — Encounter (HOSPITAL_COMMUNITY)
Admission: RE | Admit: 2016-04-26 | Discharge: 2016-04-26 | Disposition: A | Discharge status: Home / Self Care | Payer: Medicare Other | Source: Ambulatory Visit | Attending: Interventional Cardiology | Admitting: Interventional Cardiology

## 2016-04-26 DIAGNOSIS — Z955 Presence of coronary angioplasty implant and graft: Secondary | ICD-10-CM | POA: Diagnosis not present

## 2016-04-29 ENCOUNTER — Telehealth: Payer: Self-pay | Admitting: Physician Assistant

## 2016-04-29 ENCOUNTER — Encounter: Payer: Self-pay | Admitting: Interventional Cardiology

## 2016-04-29 ENCOUNTER — Encounter (HOSPITAL_COMMUNITY)
Admission: RE | Admit: 2016-04-29 | Discharge: 2016-04-29 | Disposition: A | Discharge status: Home / Self Care | Payer: Medicare Other | Source: Ambulatory Visit | Attending: Interventional Cardiology | Admitting: Interventional Cardiology

## 2016-04-29 DIAGNOSIS — Z955 Presence of coronary angioplasty implant and graft: Secondary | ICD-10-CM

## 2016-04-29 NOTE — Telephone Encounter (Signed)
[name] with cardiac rehab called today. This is a patient of Dr. Michaelle CopasSmith's. The patient exercised in cardiac rehab today in its entirety without any symptoms. Upon completion of her exercise today she mentioned to [name] she had had some chest discomfort yesterday that felt like gas. No other sx reported, and symptoms have not recurred since that time. Per [name], VSS, EKG nonacute with occasional PVCs, patient feels fine today. EKG/strips will be faxed to Dr. Katrinka BlazingSmith to the office. Will send this message to CV triage to see if they can work patient in for an office visit within the next day or two to follow up. I also asked [name] to go over warning precautions (i.e. present to ER for recurrent sx). She verbalized understanding. Mardy Lucier PA-C

## 2016-04-29 NOTE — Telephone Encounter (Signed)
She skipped her OV last week.

## 2016-04-29 NOTE — Progress Notes (Signed)
[name] reported having chest discomfort in her left breast area after eating lunch yesterday afternoon. Mrs [name] said she took some mira lax and the discomfort went away towards the end of the day. Mrs [name] reported this to me after she had already started to exercise. No complaints of chest discomfort today. Vital signs stable. Telemetry rhythm Sinus. [name] Catherine HarmanDana said she will arrange for [name] to follow up in the office in the next few day. Patient given appointment to see [name] tomorrow at 3:30PM. [name] called while the patient was still here and was given the appointment. Catherine LighterMaria Whitaker, [name],BSN 04/29/2016 12:41 PM

## 2016-04-29 NOTE — Telephone Encounter (Signed)
Pt scheduled to see Ronie SpiesDayna Dunn PA 04/30/16 at 3:30. Maria and pt aware of appointment since the pt is still at cardiac rehab.

## 2016-04-30 ENCOUNTER — Ambulatory Visit (INDEPENDENT_AMBULATORY_CARE_PROVIDER_SITE_OTHER): Payer: Medicare Other | Admitting: Physician Assistant

## 2016-04-30 ENCOUNTER — Encounter (INDEPENDENT_AMBULATORY_CARE_PROVIDER_SITE_OTHER): Payer: Self-pay

## 2016-04-30 ENCOUNTER — Encounter: Payer: Self-pay | Admitting: Physician Assistant

## 2016-04-30 VITALS — BP 106/68 | HR 79 | Ht 66.5 in | Wt 224.8 lb

## 2016-04-30 DIAGNOSIS — I480 Paroxysmal atrial fibrillation: Secondary | ICD-10-CM

## 2016-04-30 DIAGNOSIS — R5383 Other fatigue: Secondary | ICD-10-CM

## 2016-04-30 DIAGNOSIS — I5032 Chronic diastolic (congestive) heart failure: Secondary | ICD-10-CM

## 2016-04-30 DIAGNOSIS — I2510 Atherosclerotic heart disease of native coronary artery without angina pectoris: Secondary | ICD-10-CM | POA: Diagnosis not present

## 2016-04-30 DIAGNOSIS — R079 Chest pain, unspecified: Secondary | ICD-10-CM | POA: Diagnosis not present

## 2016-04-30 DIAGNOSIS — I10 Essential (primary) hypertension: Secondary | ICD-10-CM

## 2016-04-30 LAB — TSH: TSH: 2.7 mIU/L

## 2016-04-30 NOTE — Progress Notes (Signed)
Cardiology Office Note    Date:  04/30/2016  ID:  [name], DOB 05-17-1936, MRN [id number] PCP:  [name]., MD  Cardiologist:  Mendel Ryder  Chief Complaint: f/u chest pain  History of Present Illness:  [name] is a 80 y.o. female with history of CAD (s/p DES to LAD 2006, DES to Cx 2014, angiosculpt scoring balloon/DES to LAD 2016, DES to RCA 12/2015), chronic diastolic CHF, HTN, atrial fib/flutter, hyperlipidemia, pre-diabetes, chronic LEE (L>R), fibromyalgia, anxiety, remote h/o stomach ulcers who presents for evaluation of chest pain.  The only documentation I was able to find with regard to her AF/AFL is an admission in 2012 in which the H/P reports she had atrial flutter and the discharge summary said she had afib, which was treated with IV amiodarone, and resolved after only hours of presentation. She has not had any clinical recurrence. Last cath 01/01/16 showed 70% D1, 30% prox-mLAD, 10% distal LAD previously tx with DES, 60% ostial Cx, 20% LM, 40% mRCA, 50% ostial-prox RCA, 90% ostial RCA s/p PTCA/DES, LVEF normal. 2D echo 12/31/15: EF 55-60%, PASP 31. She has followed in the lipid clinic by pharmacy - LDL dropped from 204 to 102 on Crestor 10mg  daily 4 days a week which she has tolerated.  She returns to clinic today to discuss 2 issues: 1) had a vague discomfort started night before last in her thorax that started in her lower stomach and radiated to epigastrum. Persisted on/off, nonexertional, unlike prior angina, never had this before, no associated sx. Felt like she needed to belch - yesterday took Miralax and had BM with quick relief of sx. Also drank Coke and continued to feel better. Symptom free by the time she was at cardiac rehab - exercised and felt "great" - no CP or SOB during exercise.  2) notices she feels very "draggy" ever since PCI in 12/2015. Not sure if it's r/t meds. Feels like it happens when she eats a meal - can fall asleep at the drop  of a hat. Significant daytime fatigue.   Past Medical History:  Diagnosis Date  . Anxiety   . Arthritis    Multiple joints  . Chronic diastolic CHF (congestive heart failure) (HCC) 01/16/2016  . Coronary artery disease    a. s/p DES to LAD 2006. b. DES to Cx 2014. c. angiosculpt scoring balloon/DES to LAD 2016. d. DES to RCA 12/2015.  [name] Dysrhythmia    Palpitations, possible atrial fibrillation  . Fibromyalgia   . GERD (gastroesophageal reflux disease)   . High cholesterol   . History of stomach ulcers    Remotely  . Hypertension   . Lower extremity edema    a. h/o chronic edema L>R.  . Osteoporosis   . PAF (paroxysmal atrial fibrillation) (HCC)    a. 2012 - H/P says atrial flutter, D/c summary says atrial fib - was tx with IV amiodarone, only had a few hours of arrhythmias, no clinical recurrence as of 2017.  [name] Ventricular hypertrophy   . Vertigo 08/27/2006    Past Surgical History:  Procedure Laterality Date  . ABDOMINAL HYSTERECTOMY  1970's  . BREAST BIOPSY Right 1970's  . BREAST LUMPECTOMY Right 1970's   benign   . CARDIAC CATHETERIZATION  10/2006   [name] 08/03/2007 (01/27/2013)  . CARDIAC CATHETERIZATION N/A 01/01/2016   Procedure: Left Heart Cath and Coronary Angiography;  Surgeon: Kathleene Hazel, MD;  Location: [facility] INVASIVE CV LAB;  Service: Cardiovascular;  Laterality: N/A;  .  CARDIAC CATHETERIZATION N/A 01/01/2016   Procedure: Coronary Stent Intervention;  Surgeon: Kathleene Hazel, MD;  Location: [facility] INVASIVE CV LAB;  Service: Cardiovascular;  Laterality: N/A;  . CORONARY ANGIOPLASTY WITH STENT PLACEMENT  06/2005; 01/27/2013    2.515 mm Cypher stent to LAD in 2006; 2.75 x 12 Promus DES to the circumflex 2014    . DIAGNOSTIC LAPAROSCOPY  2000   lap abdominal lysis of adhesions  . ESOPHAGOGASTRODUODENOSCOPY N/A 11/10/2014   Procedure: ESOPHAGOGASTRODUODENOSCOPY (EGD);  Surgeon: Jeani Hawking, MD;  Location: [facility] ENDOSCOPY;  Service: Endoscopy;  Laterality: N/A;  .  HERNIA REPAIR    . LAPAROSCOPIC SALPINGO OOPHERECTOMY  2000  . LAPAROTOMY     "day after oophorectomy; had bowel obstruction" (01/27/2013)  . LEFT HEART CATHETERIZATION WITH CORONARY ANGIOGRAM N/A 01/27/2013   Procedure: LEFT HEART CATHETERIZATION WITH CORONARY ANGIOGRAM;  Surgeon: Corky Crafts, MD;  Location: [facility] CATH LAB;  Service: Cardiovascular;  Laterality: N/A;  . LEFT HEART CATHETERIZATION WITH CORONARY ANGIOGRAM N/A 11/10/2014   Procedure: LEFT HEART CATHETERIZATION WITH CORONARY ANGIOGRAM;  Surgeon: Lennette Bihari, MD; left main okay, LAD 75% ISR, FFR 0.75, s/p Angiosculpt scoring balloon and 2.7518 mm Resolute DES, CFX 70%, stent patent, RCA 50%  . TONSILLECTOMY AND ADENOIDECTOMY  ~ 1952    Current Medications: Current Outpatient Prescriptions  Medication Sig Dispense Refill  . acetaminophen (TYLENOL) 500 MG tablet Take 500 mg by mouth daily as needed (pain).    [name] albuterol (PROVENTIL HFA;VENTOLIN HFA) 108 (90 Base) MCG/ACT inhaler Inhale 2 puffs into the lungs as directed.    [name] ALPRAZolam (XANAX) 0.25 MG tablet Take 0.25 mg by mouth 2 (two) times daily as needed for anxiety. anxiety    . aspirin EC 81 MG tablet Take 81 mg by mouth daily.      . cetirizine (ZYRTEC) 10 MG tablet Take 10 mg by mouth daily.    . clopidogrel (PLAVIX) 75 MG tablet Take 1 tablet (75 mg total) by mouth daily. 30 tablet 11  . conjugated estrogens (PREMARIN) vaginal cream Place 1 Applicatorful vaginally daily as needed (dryness). Reported on 12/29/2015    . Diphenhydramine-APAP, sleep, (TYLENOL PM EXTRA STRENGTH PO) Take 1 tablet by mouth at bedtime as needed. For sleep    . fluticasone (FLONASE) 50 MCG/ACT nasal spray Place 2 sprays into both nostrils daily. 1 g 2  . HYDROcodone-acetaminophen (NORCO/VICODIN) 5-325 MG per tablet Take 0.5-1 tablets by mouth every 6 (six) hours as needed for moderate pain. 20 tablet 0  . lidocaine (LIDODERM) 5 % Place 1 patch onto the skin daily as needed (pain).     [name]  loratadine (CLARITIN) 10 MG tablet Take 10 mg by mouth daily. Reported on 12/29/2015    . LYRICA 75 MG capsule Take 75 mg by mouth at bedtime.     . metoprolol tartrate (LOPRESSOR) 25 MG tablet Take 0.5 tablets (12.5 mg total) by mouth 2 (two) times daily. 60 tablet 2  . nitroGLYCERIN (NITROSTAT) 0.4 MG SL tablet Place 1 tablet (0.4 mg total) under the tongue every 5 (five) minutes as needed for chest pain (CP or SOB). 25 tablet 2  . pantoprazole (PROTONIX) 40 MG tablet Take 1 tablet (40 mg total) by mouth daily at 6 (six) AM. 30 tablet 6  . potassium chloride SA (K-DUR,KLOR-CON) 20 MEQ tablet Take 1 tablet (20 mEq total) by mouth daily. 90 tablet 3  . rosuvastatin (CRESTOR) 10 MG tablet Take 10 mg by mouth 4 (four) times a week.    [name]  torsemide (DEMADEX) 20 MG tablet Take 20 mg by mouth daily. Take 1 to 2 tablets by mouth daily as needed for swelling.    . valsartan-hydrochlorothiazide (DIOVAN-HCT) 160-12.5 MG per tablet Take 1 tablet by mouth daily.     . Vitamin D, Ergocalciferol, (DRISDOL) 50000 UNITS CAPS capsule Take 50,000 Units by mouth every 7 (seven) days. Take on Mondays    . zolpidem (AMBIEN) 10 MG tablet Take 5 mg by mouth daily as needed for sleep. Reported on 12/29/2015     No current facility-administered medications for this visit.      Allergies:   Adhesive [tape]; Statins; Codeine; and Elavil [amitriptyline]   Social History   Social History  . Marital status: Married    Spouse name: N/A  . Number of children: 3  . Years of education: masters   Occupational History  . retired    Social History Main Topics  . Smoking status: Former Smoker    Packs/day: 0.12    Years: 36.00    Types: Cigarettes    Quit date: 12/02/1991  . Smokeless tobacco: Never Used  . Alcohol use No  . Drug use: No  . Sexual activity: Not Currently    Birth control/ protection: Surgical   Other Topics Concern  . None   Social History Narrative  . None     Family History:  The patient's  family history includes Heart attack in her father; Stroke in her mother.  ROS:   Please see the history of present illness.  +Chronic LEE unchanged L>R (prior venous duplex for same). All other systems are reviewed and otherwise negative.    PHYSICAL EXAM:   VS:  BP 106/68   Pulse 79   Ht 5' 6.5" (1.689 m)   Wt 224 lb 12.8 oz (102 kg)   BMI 35.74 kg/m   BMI: Body mass index is 35.74 kg/m. GEN: Well nourished, well developed obese AAF, in no acute distress  HEENT: normocephalic, atraumatic Neck: no JVD, carotid bruits, or masses Cardiac: RRR; no murmurs, rubs, or gallops, no edema  Respiratory:  clear to auscultation bilaterally, normal work of breathing GI: soft, nontender, nondistended, + BS MS: no deformity or atrophy  Skin: warm and dry, no rash Neuro:  Alert and Oriented x 3, Strength and sensation are intact, follows commands Psych: euthymic mood, full affect  Wt Readings from Last 3 Encounters:  04/30/16 224 lb 12.8 oz (102 kg)  04/16/16 224 lb 3.3 oz (101.7 kg)  01/16/16 222 lb 9.6 oz (101 kg)      Studies/Labs Reviewed:   EKG:  EKG was ordered today and personally reviewed by me and demonstrates NSR 79bpm,nonspecific TWIchanges - TWI I, aVL, flattening V3-V6. Does not appear significantly changed from prior.  Recent Labs: 12/29/2015: Magnesium 2.2 01/02/2016: Hemoglobin 12.7; Platelets 232 01/16/2016: Brain Natriuretic Peptide 36.6 01/23/2016: BUN 19; Creat 1.02; Potassium 4.4; Sodium 139   Lipid Panel    Component Value Date/Time   CHOL 271 (H) 12/30/2015 0000   TRIG 90 12/30/2015 0000   HDL 49 12/30/2015 0000   CHOLHDL 5.5 12/30/2015 0000   VLDL 18 12/30/2015 0000   LDLCALC 204 (H) 12/30/2015 0000    Additional studies/ records that were reviewed today include: Summarized above    ASSESSMENT & PLAN:   1. Chest pain - atypical, sounds GI in nature, resolved following bowel movement. Would recommend careful observation for now. Discussed importance of  observing for recurrent sx - will be helpful for her  to continue cardiac rehab to give us an idea if she is developing any exertional component. She exercised yesterday without any issue, which is reassuring. Warning sx reviewed with patient yesterday. 2. Fatigue - will start with TSH and sleep study. Other potential etiologies could include BB therapy or borderline low BP. I also discussed possibility of w/u for insulin resistance with her PCP as this is a potential cause of fatigue, particularly with post-prandial pattern. 3. CAD - continue ASA, BB, statin. She is pleased with reduction in LDL. 4. Chronic diastolic CHF - appears euvolemic. Unchanged LEE. 5. Paroxysmal afib (remote hx) - no clinical recurrence. If she develops symptoms suggestive of arrhythmia, would need to place monitor and consider anticoagulation. 6. Essential HTN - controlled. Slightly on the lower side. If sleep study unremarkable, could consider cutting back on BP medication to see if this helps.  Disposition: F/u with Dr. Katrinka BlazingSmith in 3-4 months.   Medication Adjustments/Labs and Tests Ordered: Current medicines are reviewed at length with the patient today.  Concerns regarding medicines are outlined above. Medication changes, Labs and Tests ordered today are summarized above and listed in the Patient Instructions accessible in Encounters.   Thomasene MohairSigned, Dayna Dunn PA-C  04/30/2016 4:19 PM    [facility] [address], [place], [place]  [address] Phone: [phone]; Fax: [phone]

## 2016-04-30 NOTE — Addendum Note (Signed)
Addended by: Tonita PhoenixBOWDEN, Lilyan Prete K on: 04/30/2016 04:46 PM   Modules accepted: Orders

## 2016-04-30 NOTE — Patient Instructions (Signed)
Schedule sleep study   Lab Work today ( tsh )   Your physician recommends that you schedule a follow-up appointment in: 3 to 4 months with Dr.Smith

## 2016-05-01 ENCOUNTER — Encounter (HOSPITAL_COMMUNITY)
Admission: RE | Admit: 2016-05-01 | Discharge: 2016-05-01 | Disposition: A | Discharge status: Home / Self Care | Payer: Medicare Other | Source: Ambulatory Visit | Attending: Interventional Cardiology | Admitting: Interventional Cardiology

## 2016-05-01 DIAGNOSIS — Z955 Presence of coronary angioplasty implant and graft: Secondary | ICD-10-CM | POA: Diagnosis not present

## 2016-05-01 NOTE — Progress Notes (Signed)
[name] returned to exercise today and exercised without difficulty.Catherine LighterMaria Trishia Cuthrell, RN,BSN 05/01/2016 2:25 PM

## 2016-05-03 ENCOUNTER — Encounter (HOSPITAL_COMMUNITY)
Admission: RE | Admit: 2016-05-03 | Discharge: 2016-05-03 | Disposition: A | Discharge status: Home / Self Care | Payer: Medicare Other | Source: Ambulatory Visit | Attending: Interventional Cardiology | Admitting: Interventional Cardiology

## 2016-05-03 DIAGNOSIS — Z955 Presence of coronary angioplasty implant and graft: Secondary | ICD-10-CM | POA: Diagnosis not present

## 2016-05-03 NOTE — Progress Notes (Signed)
Reviewed home exercise with pt today.  Pt plans to walk for exercise.  Reviewed THR, pulse, RPE, sign and symptoms, NTG use, and when to call 911 or MD.  Also discussed weather considerations and indoor options.  Pt voiced understanding.    Angalena Cousineau,MS,ACSM RCEP 

## 2016-05-08 ENCOUNTER — Encounter (HOSPITAL_COMMUNITY)
Admission: RE | Admit: 2016-05-08 | Discharge: 2016-05-08 | Disposition: A | Discharge status: Home / Self Care | Payer: Medicare Other | Source: Ambulatory Visit | Attending: Interventional Cardiology | Admitting: Interventional Cardiology

## 2016-05-08 DIAGNOSIS — Z955 Presence of coronary angioplasty implant and graft: Secondary | ICD-10-CM | POA: Diagnosis not present

## 2016-05-10 ENCOUNTER — Encounter (HOSPITAL_COMMUNITY)
Admission: RE | Admit: 2016-05-10 | Discharge: 2016-05-10 | Disposition: A | Discharge status: Home / Self Care | Payer: Medicare Other | Source: Ambulatory Visit | Attending: Interventional Cardiology | Admitting: Interventional Cardiology

## 2016-05-10 DIAGNOSIS — Z955 Presence of coronary angioplasty implant and graft: Secondary | ICD-10-CM

## 2016-05-13 ENCOUNTER — Encounter (HOSPITAL_COMMUNITY)
Admission: RE | Admit: 2016-05-13 | Discharge: 2016-05-13 | Disposition: A | Discharge status: Home / Self Care | Payer: Medicare Other | Source: Ambulatory Visit | Attending: Interventional Cardiology | Admitting: Interventional Cardiology

## 2016-05-13 DIAGNOSIS — Z955 Presence of coronary angioplasty implant and graft: Secondary | ICD-10-CM | POA: Diagnosis not present

## 2016-05-13 NOTE — Progress Notes (Signed)
[name] 80 y.o. female Nutrition Note Spoke with pt. Nutrition Plan and Nutrition Survey goals reviewed with pt. Pt is following Step 2 of the Therapeutic Lifestyle Changes diet. Pt is pre-diabetic according to her last A1c. Pt is aware of pre-diabetes dx. Pt is familiar with DM due to "my husband is a type 2 diabetic." Pt with dx of CHF. Per discussion, pt does not use canned/convenience foods often. Pt rarely adds salt to food. Pt expressed understanding of the information reviewed. Pt aware of nutrition education classes offered and plans on attending nutrition classes.  Lab Results  Component Value Date   HGBA1C 6.4 (H) 11/08/2014   Wt Readings from Last 3 Encounters:  04/30/16 224 lb 12.8 oz (102 kg)  04/16/16 224 lb 3.3 oz (101.7 kg)  01/16/16 222 lb 9.6 oz (101 kg)    Nutrition Diagnosis ? Food-and nutrition-related knowledge deficit related to lack of exposure to information as related to diagnosis of: ? CVD ? Pre-DM ? Obesity related to excessive energy intake as evidenced by a BMI of 36.3 Nutrition Intervention ? Pt's individual nutrition plan reviewed with pt. ? Benefits of adopting Therapeutic Lifestyle Changes discussed when Medficts reviewed. ? Pt to attend the Portion Distortion class ? Pt to attend the   ? Nutrition I class                  ? Nutrition II class  ? Pt given handouts for: ? Nutrition I class ? Nutrition II class  ? Continue client-centered nutrition education by RD, as part of interdisciplinary care. Goal(s) ? Pt to identify food quantities necessary to achieve weight loss of 6-24 lb (2.7-10.9 kg) at graduation from cardiac rehab.  Monitor and Evaluate progress toward nutrition goal with team. Mickle PlumbEdna Madeline Bebout, M.Ed, RD, LDN, CDE 05/13/2016 10:35 AM

## 2016-05-15 ENCOUNTER — Encounter (HOSPITAL_COMMUNITY)
Admission: RE | Admit: 2016-05-15 | Discharge: 2016-05-15 | Disposition: A | Discharge status: Home / Self Care | Payer: Medicare Other | Source: Ambulatory Visit | Attending: Interventional Cardiology | Admitting: Interventional Cardiology

## 2016-05-15 DIAGNOSIS — Z955 Presence of coronary angioplasty implant and graft: Secondary | ICD-10-CM | POA: Diagnosis not present

## 2016-05-17 ENCOUNTER — Encounter (HOSPITAL_COMMUNITY)
Admission: RE | Admit: 2016-05-17 | Discharge: 2016-05-17 | Disposition: A | Discharge status: Home / Self Care | Payer: Medicare Other | Source: Ambulatory Visit | Attending: Interventional Cardiology | Admitting: Interventional Cardiology

## 2016-05-17 DIAGNOSIS — Z955 Presence of coronary angioplasty implant and graft: Secondary | ICD-10-CM

## 2016-05-17 NOTE — Progress Notes (Signed)
Cardiac Individual Treatment Plan  Patient Details  Name: [name] MRN: [id number] Date of Birth: 1936/06/10 Referring Provider:   Flowsheet Row CARDIAC REHAB PHASE II EXERCISE from 04/16/2016 in [facility] CARDIAC [facility]  Referring Provider  Verdis Prime iii      Initial Encounter Date:  Flowsheet Row CARDIAC REHAB PHASE II EXERCISE from 04/16/2016 in [facility] CARDIAC REHAB  Date  04/16/16  Referring Provider  Verdis Prime iii      Visit Diagnosis: Status post coronary artery stent placement  Patient's Home Medications on Admission:  Current Outpatient Prescriptions:  .  acetaminophen (TYLENOL) 500 MG tablet, Take 500 mg by mouth daily as needed (pain)., Disp: , Rfl:  .  albuterol (PROVENTIL HFA;VENTOLIN HFA) 108 (90 Base) MCG/ACT inhaler, Inhale 2 puffs into the lungs as directed., Disp: , Rfl:  .  ALPRAZolam (XANAX) 0.25 MG tablet, Take 0.25 mg by mouth 2 (two) times daily as needed for anxiety. anxiety, Disp: , Rfl:  .  aspirin EC 81 MG tablet, Take 81 mg by mouth daily.  , Disp: , Rfl:  .  cetirizine (ZYRTEC) 10 MG tablet, Take 10 mg by mouth daily., Disp: , Rfl:  .  clopidogrel (PLAVIX) 75 MG tablet, Take 1 tablet (75 mg total) by mouth daily., Disp: 30 tablet, Rfl: 11 .  conjugated estrogens (PREMARIN) vaginal cream, Place 1 Applicatorful vaginally daily as needed (dryness). Reported on 12/29/2015, Disp: , Rfl:  .  Diphenhydramine-APAP, sleep, (TYLENOL PM EXTRA STRENGTH PO), Take 1 tablet by mouth at bedtime as needed. For sleep, Disp: , Rfl:  .  fluticasone (FLONASE) 50 MCG/ACT nasal spray, Place 2 sprays into both nostrils daily., Disp: 1 g, Rfl: 2 .  HYDROcodone-acetaminophen (NORCO/VICODIN) 5-325 MG per tablet, Take 0.5-1 tablets by mouth every 6 (six) hours as needed for moderate pain., Disp: 20 tablet, Rfl: 0 .  lidocaine (LIDODERM) 5 %, Place 1 patch onto the skin daily as needed (pain). , Disp: , Rfl:  .  loratadine  (CLARITIN) 10 MG tablet, Take 10 mg by mouth daily. Reported on 12/29/2015, Disp: , Rfl:  .  LYRICA 75 MG capsule, Take 75 mg by mouth at bedtime. , Disp: , Rfl:  .  metoprolol tartrate (LOPRESSOR) 25 MG tablet, Take 0.5 tablets (12.5 mg total) by mouth 2 (two) times daily., Disp: 60 tablet, Rfl: 2 .  nitroGLYCERIN (NITROSTAT) 0.4 MG SL tablet, Place 1 tablet (0.4 mg total) under the tongue every 5 (five) minutes as needed for chest pain (CP or SOB)., Disp: 25 tablet, Rfl: 2 .  pantoprazole (PROTONIX) 40 MG tablet, Take 1 tablet (40 mg total) by mouth daily at 6 (six) AM., Disp: 30 tablet, Rfl: 6 .  potassium chloride SA (K-DUR,KLOR-CON) 20 MEQ tablet, Take 1 tablet (20 mEq total) by mouth daily., Disp: 90 tablet, Rfl: 3 .  rosuvastatin (CRESTOR) 10 MG tablet, Take 10 mg by mouth 4 (four) times a week., Disp: , Rfl:  .  torsemide (DEMADEX) 20 MG tablet, Take 20 mg by mouth daily. Take 1 to 2 tablets by mouth daily as needed for swelling., Disp: , Rfl:  .  valsartan-hydrochlorothiazide (DIOVAN-HCT) 160-12.5 MG per tablet, Take 1 tablet by mouth daily. , Disp: , Rfl:  .  Vitamin D, Ergocalciferol, (DRISDOL) 50000 UNITS CAPS capsule, Take 50,000 Units by mouth every 7 (seven) days. Take on Mondays, Disp: , Rfl:  .  zolpidem (AMBIEN) 10 MG tablet, Take 5 mg by mouth daily as needed  for sleep. Reported on 12/29/2015, Disp: , Rfl:   Past Medical History: Past Medical History:  Diagnosis Date  . Anxiety   . Arthritis    Multiple joints  . Chronic diastolic CHF (congestive heart failure) (HCC) 01/16/2016  . Coronary artery disease    a. s/p DES to LAD 2006. b. DES to Cx 2014. c. angiosculpt scoring balloon/DES to LAD 2016. d. DES to RCA 12/2015.  . Fibromyalgia   . GERD (gastroesophageal reflux disease)   . High cholesterol   . History of stomach ulcers    Remotely  . Hypertension   . Lower extremity edema    a. h/o chronic edema L>R.  . Osteoporosis   . PAF (paroxysmal atrial fibrillation) (HCC)     a. 2012 - H/P says atrial flutter, D/c summary says atrial fib - was tx with IV amiodarone, only had a few hours of arrhythmias, no clinical recurrence as of 2017.  [name] Ventricular hypertrophy   . Vertigo 08/27/2006    Tobacco Use: History  Smoking Status  . Former Smoker  . Packs/day: 0.12  . Years: 36.00  . Types: Cigarettes  . Quit date: 12/02/1991  Smokeless Tobacco  . Never Used    Labs: Recent Review Flowsheet Data    Labs for ITP Cardiac and Pulmonary Rehab Latest Ref Rng & Units 04/18/2009 04/07/2010 07/06/2011 11/08/2014 12/30/2015   Cholestrol 0 - 200 mg/dL 161 ATP III CLASSIFICATION: <200     mg/dL   Desirable 096-045  mg/dL   Borderline High >=409    mg/dL   High(H) - - - 811(B)   LDLCALC 0 - 99 mg/dL 147 Total Cholesterol/HDL:CHD Risk Coronary Heart Disease Risk Table Men   Women 1/2 Average Risk   3.4   3.3 Average Risk       5.0   4.4 2 X Average Risk   9.6   7.1 3 X Average Risk  23.4   11.0 Use the calculated Patient Ratio above and the CHD Risk Table to determine the patient's CHD Risk. ATP III CLASSIFICATION (LDL): <100     mg/dL   Optimal 829-562  mg/dL   Near or Above Optimal 130-159  mg/dL   Borderline 130-865  mg/dL   High >784     mg/dL   Very High(H) - - - 696(E)   HDL >40 mg/dL 42 - - - 49   Trlycerides <150 mg/dL 952 - - - 90   Hemoglobin A1c 4.8 - 5.6 % - - - 6.4(H) -   PHART 7.350 - 7.400 - - - - -   PCO2ART 35.0 - 45.0 mmHg - - - - -   HCO3 20.0 - 24.0 mEq/L - - - - -   TCO2 0 - 100 mmol/L - 29 27 26  -   O2SAT % - - - - -      Capillary Blood Glucose: Lab Results  Component Value Date   GLUCAP 91 01/13/2015     Exercise Target Goals:    Exercise Program Goal: Individual exercise prescription set with THRR, safety & activity barriers. Participant demonstrates ability to understand and report RPE using BORG scale, to self-measure pulse accurately, and to acknowledge the importance of the exercise prescription.  Exercise  Prescription Goal: Starting with aerobic activity 30 plus minutes a day, 3 days per week for initial exercise prescription. Provide home exercise prescription and guidelines that participant acknowledges understanding prior to discharge.  Activity Barriers & Risk Stratification:     Activity  Barriers & Cardiac Risk Stratification - 04/17/16 0921      Activity Barriers & Cardiac Risk Stratification   Cardiac Risk Stratification High      6 Minute Walk:     6 Minute Walk    Row Name 04/16/16 1645         6 Minute Walk   Phase Initial     Distance 1248 feet     Walk Time 6 minutes     # of Rest Breaks 0     MPH 2.36     METS 2.06     RPE 11     Perceived Dyspnea  1     VO2 Peak 7.2     Symptoms Yes (comment)     Comments Mild SOB toward end of walk test     Resting HR 68 bpm     Resting BP 126/76     Max Ex. HR 105 bpm     Max Ex. BP 142/72     2 Minute Post BP 112/72        Initial Exercise Prescription:     Initial Exercise Prescription - 04/16/16 1600      Date of Initial Exercise RX and Referring Provider   Date 04/16/16   Referring Provider Verdis Prime iii     Treadmill   MPH 1.7   Minutes 10   METs 2.3     Recumbant Bike   Level 2   Watts 15   Minutes 10   METs 2.46     NuStep   Level 2   Minutes 10   METs 2     Prescription Details   Frequency (times per week) 3   Duration Progress to 30 minutes of continuous aerobic without signs/symptoms of physical distress     Intensity   THRR 40-80% of Max Heartrate 57-114   Ratings of Perceived Exertion 11-13   Perceived Dyspnea 0-4     Progression   Progression Continue to progress workloads to maintain intensity without signs/symptoms of physical distress.     Resistance Training   Training Prescription Yes   Weight 2lbs   Reps 10-12      Perform Capillary Blood Glucose checks as needed.  Exercise Prescription Changes:     Exercise Prescription Changes    Row Name 05/13/16 0700              Exercise Review   Progression Yes         Response to Exercise   Blood Pressure (Admit) 124/60       Blood Pressure (Exercise) 140/78       Blood Pressure (Exit) 108/70       Heart Rate (Admit) 87 bpm       Heart Rate (Exercise) 104 bpm       Heart Rate (Exit) 87 bpm       Rating of Perceived Exertion (Exercise) 12       Symptoms none       Comments Home exercise reviewed on 04/29/16.       Duration Progress to 30 minutes of continuous aerobic without signs/symptoms of physical distress       Intensity THRR unchanged         Progression   Progression Continue to progress workloads to maintain intensity without signs/symptoms of physical distress.       Average METs 2.6         Resistance Training   Training Prescription Yes  Weight 1lb       Reps 10-12         Interval Training   Interval Training No         Treadmill   MPH 2       Grade 1       Minutes 10       METs 2.81         Recumbant Bike   Level 2       Watts 21       Minutes 10       METs 2.63         NuStep   Level 3       Minutes 10       METs 2.3         Home Exercise Plan   Plans to continue exercise at Home       Frequency Add 2 additional days to program exercise sessions.          Exercise Comments:     Exercise Comments    Row Name 05/15/16 [phone]           Exercise Comments Off to a good start with exercise. Pt walking 15-20 minutes, 5 dyas/week at home.          Discharge Exercise Prescription (Final Exercise Prescription Changes):     Exercise Prescription Changes - 05/13/16 0700      Exercise Review   Progression Yes     Response to Exercise   Blood Pressure (Admit) 124/60   Blood Pressure (Exercise) 140/78   Blood Pressure (Exit) 108/70   Heart Rate (Admit) 87 bpm   Heart Rate (Exercise) 104 bpm   Heart Rate (Exit) 87 bpm   Rating of Perceived Exertion (Exercise) 12   Symptoms none   Comments Home exercise reviewed on 04/29/16.   Duration Progress  to 30 minutes of continuous aerobic without signs/symptoms of physical distress   Intensity THRR unchanged     Progression   Progression Continue to progress workloads to maintain intensity without signs/symptoms of physical distress.   Average METs 2.6     Resistance Training   Training Prescription Yes   Weight 1lb   Reps 10-12     Interval Training   Interval Training No     Treadmill   MPH 2   Grade 1   Minutes 10   METs 2.81     Recumbant Bike   Level 2   Watts 21   Minutes 10   METs 2.63     NuStep   Level 3   Minutes 10   METs 2.3     Home Exercise Plan   Plans to continue exercise at Home   Frequency Add 2 additional days to program exercise sessions.      Nutrition:  Target Goals: Understanding of nutrition guidelines, daily intake of sodium 1500mg , cholesterol 200mg , calories 30% from fat and 7% or less from saturated fats, daily to have 5 or more servings of fruits and vegetables.  Biometrics:     Pre Biometrics - 04/16/16 1643      Pre Biometrics   Height 5\' 6"  (1.676 m)   Weight 224 lb 3.3 oz (101.7 kg)   Waist Circumference 46 inches   Hip Circumference 46 inches   Waist to Hip Ratio 1 %   BMI (Calculated) 36.3   Triceps Skinfold 38 mm   % Body Fat 38.1 %   Grip Strength 27.5 kg  Flexibility 14.5 in   Single Leg Stand 2.75 seconds       Nutrition Therapy Plan and Nutrition Goals:     Nutrition Therapy & Goals - 04/17/16 0944      Nutrition Therapy   Diet Therapeutic Lifestyle Changes     Personal Nutrition Goals   Personal Goal #1 1-2 lb wt loss/week to a wt loss goal of 6-20 lb at graduation from Cardiac Rehab     Intervention Plan   Intervention Prescribe, educate and counsel regarding individualized specific dietary modifications aiming towards targeted core components such as weight, hypertension, lipid management, diabetes, heart failure and other comorbidities.   Expected Outcomes Short Term Goal: Understand basic  principles of dietary content, such as calories, fat, sodium, cholesterol and nutrients.;Long Term Goal: Adherence to prescribed nutrition plan.      Nutrition Discharge: Nutrition Scores:     Nutrition Assessments - 04/17/16 0944      MEDFICTS Scores   Pre Score 18      Nutrition Goals Re-Evaluation:   Psychosocial: Target Goals: Acknowledge presence or absence of depression, maximize coping skills, provide positive support system. Participant is able to verbalize types and ability to use techniques and skills needed for reducing stress and depression.  Initial Review & Psychosocial Screening:     Initial Psych Review & Screening - 04/17/16 0830      Family Dynamics   Good Support System? Yes     Barriers   Psychosocial barriers to participate in program The patient should benefit from training in stress management and relaxation.     Screening Interventions   Interventions Encouraged to exercise      Quality of Life Scores:     Quality of Life - 04/16/16 1644      Quality of Life Scores   Health/Function Pre 18.8 %   Socioeconomic Pre 27 %   Psych/Spiritual Pre 25.71 %   Family Pre 20.4 %   GLOBAL Pre 22 %      PHQ-9: Recent Review Flowsheet Data    Depression screen [facility] 2/9 04/22/2016 03/22/2015 12/21/2014 02/15/2013   Decreased Interest 1  0 0 1   Down, Depressed, Hopeless 0 0 0 1   PHQ - 2 Score 1 0 0 2   Altered sleeping - - - 1   Tired, decreased energy - - - 1   Change in appetite - - - 0   Feeling bad or failure about yourself  - - - 1   Trouble concentrating - - - 1   Moving slowly or fidgety/restless - - - 1   Suicidal thoughts - - - 0   PHQ-9 Score - - - 7      Psychosocial Evaluation and Intervention:   Psychosocial Re-Evaluation:     Psychosocial Re-Evaluation    Row Name 05/17/16 1706             Psychosocial Re-Evaluation   Interventions Encouraged to attend Cardiac Rehabilitation for the exercise       Continued  Psychosocial Services Needed No          Vocational Rehabilitation: Provide vocational rehab assistance to qualifying candidates.   Vocational Rehab Evaluation & Intervention:     Vocational Rehab - 04/17/16 0919      Initial Vocational Rehab Evaluation & Intervention   Assessment shows need for Vocational Rehabilitation No      Education: Education Goals: Education classes will be provided on a weekly basis, covering required topics. Participant will  state understanding/return demonstration of topics presented.  Learning Barriers/Preferences:     Learning Barriers/Preferences - 04/17/16 0915      Learning Barriers/Preferences   Learning Barriers Sight  wears glasses   Learning Preferences Audio;Skilled Demonstration      Education Topics: Count Your Pulse:  -Group instruction provided by verbal instruction, demonstration, patient participation and written materials to support subject.  Instructors address importance of being able to find your pulse and how to count your pulse when at home without a heart monitor.  Patients get hands on experience counting their pulse with staff help and individually.   Heart Attack, Angina, and Risk Factor Modification:  -Group instruction provided by verbal instruction, video, and written materials to support subject.  Instructors address signs and symptoms of angina and heart attacks.    Also discuss risk factors for heart disease and how to make changes to improve heart health risk factors. Flowsheet Row CARDIAC REHAB PHASE II EXERCISE from 05/13/2016 in [facility] CARDIAC REHAB  Date  04/24/16  Instruction Review Code  2- meets goals/outcomes      Functional Fitness:  -Group instruction provided by verbal instruction, demonstration, patient participation, and written materials to support subject.  Instructors address safety measures for doing things around the house.  Discuss how to get up and down off the floor,  how to pick things up properly, how to safely get out of a chair without assistance, and balance training.   Meditation and Mindfulness:  -Group instruction provided by verbal instruction, patient participation, and written materials to support subject.  Instructor addresses importance of mindfulness and meditation practice to help reduce stress and improve awareness.  Instructor also leads participants through a meditation exercise.  Flowsheet Row CARDIAC REHAB PHASE II EXERCISE from 05/13/2016 in [facility] CARDIAC REHAB  Date  05/01/16  Instruction Review Code  2- meets goals/outcomes      Stretching for Flexibility and Mobility:  -Group instruction provided by verbal instruction, patient participation, and written materials to support subject.  Instructors lead participants through series of stretches that are designed to increase flexibility thus improving mobility.  These stretches are additional exercise for major muscle groups that are typically performed during regular warm up and cool down.   Hands Only CPR Anytime:  -Group instruction provided by verbal instruction, video, patient participation and written materials to support subject.  Instructors co-teach with AHA video for hands only CPR.  Participants get hands on experience with mannequins.   Nutrition I class: Heart Healthy Eating:  -Group instruction provided by PowerPoint slides, verbal discussion, and written materials to support subject matter. The instructor gives an explanation and review of the Therapeutic Lifestyle Changes diet recommendations, which includes a discussion on lipid goals, dietary fat, sodium, fiber, plant stanol/sterol esters, sugar, and the components of a well-balanced, healthy diet. Flowsheet Row CARDIAC REHAB PHASE II EXERCISE from 05/13/2016 in [facility] CARDIAC REHAB  Date  05/13/16  Educator  RD  Instruction Review Code  Not applicable [Class handouts given]       Nutrition II class: Lifestyle Skills:  -Group instruction provided by PowerPoint slides, verbal discussion, and written materials to support subject matter. The instructor gives an explanation and review of label reading, grocery shopping for heart health, heart healthy recipe modifications, and ways to make healthier choices when eating out. Flowsheet Row CARDIAC REHAB PHASE II EXERCISE from 05/13/2016 in [facility] CARDIAC REHAB  Date  05/13/16  Educator  RD  Instruction Review Code  Not applicable [class handouts given]      Diabetes Question & Answer:  -Group instruction provided by PowerPoint slides, verbal discussion, and written materials to support subject matter. The instructor gives an explanation and review of diabetes co-morbidities, pre- and post-prandial blood glucose goals, pre-exercise blood glucose goals, signs, symptoms, and treatment of hypoglycemia and hyperglycemia, and foot care basics.   Diabetes Blitz:  -Group instruction provided by PowerPoint slides, verbal discussion, and written materials to support subject matter. The instructor gives an explanation and review of the physiology behind type 1 and type 2 diabetes, diabetes medications and rational behind using different medications, pre- and post-prandial blood glucose recommendations and Hemoglobin A1c goals, diabetes diet, and exercise including blood glucose guidelines for exercising safely.    Portion Distortion:  -Group instruction provided by PowerPoint slides, verbal discussion, written materials, and food models to support subject matter. The instructor gives an explanation of serving size versus portion size, changes in portions sizes over the last 20 years, and what consists of a serving from each food group.   Stress Management:  -Group instruction provided by verbal instruction, video, and written materials to support subject matter.  Instructors review role of stress in heart  disease and how to cope with stress positively.     Exercising on Your Own:  -Group instruction provided by verbal instruction, power point, and written materials to support subject.  Instructors discuss benefits of exercise, components of exercise, frequency and intensity of exercise, and end points for exercise.  Also discuss use of nitroglycerin and activating EMS.  Review options of places to exercise outside of rehab.  Review guidelines for sex with heart disease.   Cardiac Drugs I:  -Group instruction provided by verbal instruction and written materials to support subject.  Instructor reviews cardiac drug classes: antiplatelets, anticoagulants, beta blockers, and statins.  Instructor discusses reasons, side effects, and lifestyle considerations for each drug class.   Cardiac Drugs II:  -Group instruction provided by verbal instruction and written materials to support subject.  Instructor reviews cardiac drug classes: angiotensin converting enzyme inhibitors (ACE-I), angiotensin II receptor blockers (ARBs), nitrates, and calcium channel blockers.  Instructor discusses reasons, side effects, and lifestyle considerations for each drug class.   Anatomy and Physiology of the Circulatory System:  -Group instruction provided by verbal instruction, video, and written materials to support subject.  Reviews functional anatomy of heart, how it relates to various diagnoses, and what role the heart plays in the overall system. Flowsheet Row CARDIAC REHAB [facility]  Date  05/08/16  Instruction Review Code  2- meets goals/outcomes      Knowledge Questionnaire Score:     Knowledge Questionnaire Score - 04/16/16 1644      Knowledge Questionnaire Score   Pre Score 21/24      Core Components/Risk Factors/Patient Goals at Admission:     Personal Goals and Risk Factors at Admission - 04/16/16 1649      Core Components/Risk  Factors/Patient Goals on Admission    Weight Management Weight Loss;Obesity;Yes   Intervention Weight Management: Develop a combined nutrition and exercise program designed to reach desired caloric intake, while maintaining appropriate intake of nutrient and fiber, sodium and fats, and appropriate energy expenditure required for the weight goal.;Weight Management/Obesity: Establish reasonable short term and long term weight goals.;Obesity: Provide education and appropriate resources to help participant work on and attain dietary goals.   Admit  Weight 224 lb 3.3 oz (101.7 kg)   Goal Weight: Short Term 220 lb (99.8 kg)   Goal Weight: Long Term 218 lb (98.9 kg)   Expected Outcomes Weight Loss: Understanding of general recommendations for a balanced deficit meal plan, which promotes 1-2 lb weight loss per week and includes a negative energy balance of [phone] kcal/d;Short Term: Continue to assess and modify interventions until short term weight is achieved;Long Term: Adherence to nutrition and physical activity/exercise program aimed toward attainment of established weight goal   Sedentary Yes   Intervention Provide advice, education, support and counseling about physical activity/exercise needs.;Develop an individualized exercise prescription for aerobic and resistive training based on initial evaluation findings, risk stratification, comorbidities and participant's personal goals.   Expected Outcomes Achievement of increased cardiorespiratory fitness and enhanced flexibility, muscular endurance and strength shown through measurements of functional capacity and personal statement of participant.   Hypertension Yes   Intervention Provide education on lifestyle modifcations including regular physical activity/exercise, weight management, moderate sodium restriction and increased consumption of fresh fruit, vegetables, and low fat dairy, alcohol moderation, and smoking cessation.;Monitor prescription use  compliance.   Expected Outcomes Short Term: Continued assessment and intervention until BP is < 140/28mm HG in hypertensive participants. < 130/102mm HG in hypertensive participants with diabetes, heart failure or chronic kidney disease.;Long Term: Maintenance of blood pressure at goal levels.   Lipids Yes   Intervention Provide education and support for participant on nutrition & aerobic/resistive exercise along with prescribed medications to achieve LDL 70mg , HDL >40mg .   Expected Outcomes Short Term: Participant states understanding of desired cholesterol values and is compliant with medications prescribed. Participant is following exercise prescription and nutrition guidelines.;Long Term: Cholesterol controlled with medications as prescribed, with individualized exercise RX and with personalized nutrition plan. Value goals: LDL < 70mg , HDL > 40 mg.   Personal Goal Other Yes   Personal Goal Get fitness back to be able to do ADLs.   Intervention Provide individulaized aerobic and resistance training program, including stretching, to improve functional fitness, so that patient can do ADLs.   Expected Outcomes Improve functional fitness through regular aerobic exercise 5-7 days per week.      Core Components/Risk Factors/Patient Goals Review:      Goals and Risk Factor Review    Row Name 05/15/16 0829             Core Components/Risk Factors/Patient Goals Review   Personal Goals Review Other       Review Home exercise reviewed with patient on 04/29/16. Patient feels strength and stamina are improving. Her goal is to walk 5,000 steps /day.       Expected Outcomes Achieve cardiorespiratory improvements through regular aerobic exercise routine.          Core Components/Risk Factors/Patient Goals at Discharge (Final Review):      Goals and Risk Factor Review - 05/15/16 0829      Core Components/Risk Factors/Patient Goals Review   Personal Goals Review Other   Review Home exercise  reviewed with patient on 04/29/16. Patient feels strength and stamina are improving. Her goal is to walk 5,000 steps /day.   Expected Outcomes Achieve cardiorespiratory improvements through regular aerobic exercise routine.      ITP Comments:     ITP Comments    Row Name 04/16/16 1416           ITP Comments Medical Director- Dr. Armanda Magic          Comments: Myrene Buddy is making  expected progress toward personal goals after completing 13 sessions. Recommend continued exercise and life style modification education including  stress management and relaxation techniques to decrease cardiac risk profile. [place] LighterMaria Justan Gaede, RN,BSN 05/17/2016 5:07 PM

## 2016-05-20 ENCOUNTER — Encounter (HOSPITAL_COMMUNITY)
Admission: RE | Admit: 2016-05-20 | Discharge: 2016-05-20 | Disposition: A | Discharge status: Home / Self Care | Payer: Medicare Other | Source: Ambulatory Visit | Attending: Interventional Cardiology | Admitting: Interventional Cardiology

## 2016-05-20 DIAGNOSIS — Z955 Presence of coronary angioplasty implant and graft: Secondary | ICD-10-CM

## 2016-05-22 ENCOUNTER — Encounter (HOSPITAL_COMMUNITY)
Admission: RE | Admit: 2016-05-22 | Discharge: 2016-05-22 | Disposition: A | Discharge status: Home / Self Care | Payer: Medicare Other | Source: Ambulatory Visit | Attending: Interventional Cardiology | Admitting: Interventional Cardiology

## 2016-05-22 DIAGNOSIS — Z955 Presence of coronary angioplasty implant and graft: Secondary | ICD-10-CM | POA: Diagnosis not present

## 2016-05-24 ENCOUNTER — Encounter (HOSPITAL_COMMUNITY)
Admission: RE | Admit: 2016-05-24 | Discharge: 2016-05-24 | Disposition: A | Discharge status: Home / Self Care | Payer: Medicare Other | Source: Ambulatory Visit | Attending: Interventional Cardiology | Admitting: Interventional Cardiology

## 2016-05-24 DIAGNOSIS — Z955 Presence of coronary angioplasty implant and graft: Secondary | ICD-10-CM | POA: Diagnosis not present

## 2016-05-27 ENCOUNTER — Encounter (HOSPITAL_COMMUNITY)
Admission: RE | Admit: 2016-05-27 | Discharge: 2016-05-27 | Disposition: A | Discharge status: Home / Self Care | Payer: Medicare Other | Source: Ambulatory Visit | Attending: Interventional Cardiology | Admitting: Interventional Cardiology

## 2016-05-27 DIAGNOSIS — Z955 Presence of coronary angioplasty implant and graft: Secondary | ICD-10-CM

## 2016-05-29 ENCOUNTER — Encounter (HOSPITAL_COMMUNITY)
Admission: RE | Admit: 2016-05-29 | Discharge: 2016-05-29 | Disposition: A | Discharge status: Home / Self Care | Payer: Medicare Other | Source: Ambulatory Visit | Attending: Interventional Cardiology | Admitting: Interventional Cardiology

## 2016-05-29 DIAGNOSIS — Z955 Presence of coronary angioplasty implant and graft: Secondary | ICD-10-CM

## 2016-05-31 ENCOUNTER — Encounter (HOSPITAL_COMMUNITY)
Admission: RE | Admit: 2016-05-31 | Discharge: 2016-05-31 | Disposition: A | Discharge status: Home / Self Care | Payer: Medicare Other | Source: Ambulatory Visit | Attending: Interventional Cardiology | Admitting: Interventional Cardiology

## 2016-05-31 DIAGNOSIS — Z955 Presence of coronary angioplasty implant and graft: Secondary | ICD-10-CM | POA: Diagnosis not present

## 2016-06-03 ENCOUNTER — Encounter (HOSPITAL_COMMUNITY)
Admission: RE | Admit: 2016-06-03 | Discharge: 2016-06-03 | Disposition: A | Discharge status: Home / Self Care | Payer: Medicare Other | Source: Ambulatory Visit | Attending: Interventional Cardiology | Admitting: Interventional Cardiology

## 2016-06-03 DIAGNOSIS — Z955 Presence of coronary angioplasty implant and graft: Secondary | ICD-10-CM | POA: Insufficient documentation

## 2016-06-05 ENCOUNTER — Encounter (HOSPITAL_COMMUNITY)
Admission: RE | Admit: 2016-06-05 | Discharge: 2016-06-05 | Disposition: A | Discharge status: Home / Self Care | Payer: Medicare Other | Source: Ambulatory Visit | Attending: Interventional Cardiology | Admitting: Interventional Cardiology

## 2016-06-05 DIAGNOSIS — Z955 Presence of coronary angioplasty implant and graft: Secondary | ICD-10-CM | POA: Diagnosis not present

## 2016-06-07 ENCOUNTER — Encounter (HOSPITAL_COMMUNITY)
Admission: RE | Admit: 2016-06-07 | Discharge: 2016-06-07 | Disposition: A | Discharge status: Home / Self Care | Payer: Medicare Other | Source: Ambulatory Visit | Attending: Interventional Cardiology | Admitting: Interventional Cardiology

## 2016-06-07 DIAGNOSIS — Z955 Presence of coronary angioplasty implant and graft: Secondary | ICD-10-CM | POA: Diagnosis not present

## 2016-06-10 ENCOUNTER — Encounter (HOSPITAL_COMMUNITY)
Admission: RE | Admit: 2016-06-10 | Discharge: 2016-06-10 | Disposition: A | Discharge status: Home / Self Care | Payer: Medicare Other | Source: Ambulatory Visit | Attending: Interventional Cardiology | Admitting: Interventional Cardiology

## 2016-06-10 ENCOUNTER — Encounter (HOSPITAL_COMMUNITY): Payer: Medicare Other

## 2016-06-10 DIAGNOSIS — Z955 Presence of coronary angioplasty implant and graft: Secondary | ICD-10-CM

## 2016-06-12 ENCOUNTER — Encounter (HOSPITAL_COMMUNITY)
Admission: RE | Admit: 2016-06-12 | Discharge: 2016-06-12 | Disposition: A | Discharge status: Home / Self Care | Payer: Medicare Other | Source: Ambulatory Visit | Attending: Interventional Cardiology | Admitting: Interventional Cardiology

## 2016-06-12 DIAGNOSIS — Z955 Presence of coronary angioplasty implant and graft: Secondary | ICD-10-CM | POA: Diagnosis not present

## 2016-06-13 NOTE — Progress Notes (Signed)
Cardiac Individual Treatment Plan  Patient Details  Name: [name] MRN: [id number] Date of Birth: Apr 17, 1936 Referring Provider:   Flowsheet Row CARDIAC REHAB PHASE II EXERCISE from 04/16/2016 in [facility]ARD[facility]  Referring Provider  Verdis Prime iii      Initial Encounter Date:  Flowsheet Row CARDIAC REHAB PHASE II EXERCISE from 04/16/2016 in [facility] CARDIAC REHAB  Date  04/16/16  Referring Provider  Verdis Prime iii      Visit Diagnosis: Status post coronary artery stent placement  Patient's Home Medications on Admission:  Current Outpatient Prescriptions:  .  acetaminophen (TYLENOL) 500 MG tablet, Take 500 mg by mouth daily as needed (pain)., Disp: , Rfl:  .  albuterol (PROVENTIL HFA;VENTOLIN HFA) 108 (90 Base) MCG/ACT inhaler, Inhale 2 puffs into the lungs as directed., Disp: , Rfl:  .  ALPRAZolam (XANAX) 0.25 MG tablet, Take 0.25 mg by mouth 2 (two) times daily as needed for anxiety. anxiety, Disp: , Rfl:  .  aspirin EC 81 MG tablet, Take 81 mg by mouth daily.  , Disp: , Rfl:  .  cetirizine (ZYRTEC) 10 MG tablet, Take 10 mg by mouth daily., Disp: , Rfl:  .  clopidogrel (PLAVIX) 75 MG tablet, Take 1 tablet (75 mg total) by mouth daily., Disp: 30 tablet, Rfl: 11 .  conjugated estrogens (PREMARIN) vaginal cream, Place 1 Applicatorful vaginally daily as needed (dryness). Reported on 12/29/2015, Disp: , Rfl:  .  Diphenhydramine-APAP, sleep, (TYLENOL PM EXTRA STRENGTH PO), Take 1 tablet by mouth at bedtime as needed. For sleep, Disp: , Rfl:  .  fluticasone (FLONASE) 50 MCG/ACT nasal spray, Place 2 sprays into both nostrils daily., Disp: 1 g, Rfl: 2 .  HYDROcodone-acetaminophen (NORCO/VICODIN) 5-325 MG per tablet, Take 0.5-1 tablets by mouth every 6 (six) hours as needed for moderate pain., Disp: 20 tablet, Rfl: 0 .  lidocaine (LIDODERM) 5 %, Place 1 patch onto the skin daily as needed (pain). , Disp: , Rfl:  .  loratadine  (CLARITIN) 10 MG tablet, Take 10 mg by mouth daily. Reported on 12/29/2015, Disp: , Rfl:  .  LYRICA 75 MG capsule, Take 75 mg by mouth at bedtime. , Disp: , Rfl:  .  metoprolol tartrate (LOPRESSOR) 25 MG tablet, Take 0.5 tablets (12.5 mg total) by mouth 2 (two) times daily., Disp: 60 tablet, Rfl: 2 .  nitroGLYCERIN (NITROSTAT) 0.4 MG SL tablet, Place 1 tablet (0.4 mg total) under the tongue every 5 (five) minutes as needed for chest pain (CP or SOB)., Disp: 25 tablet, Rfl: 2 .  pantoprazole (PROTONIX) 40 MG tablet, Take 1 tablet (40 mg total) by mouth daily at 6 (six) AM., Disp: 30 tablet, Rfl: 6 .  potassium chloride SA (K-DUR,KLOR-CON) 20 MEQ tablet, Take 1 tablet (20 mEq total) by mouth daily., Disp: 90 tablet, Rfl: 3 .  rosuvastatin (CRESTOR) 10 MG tablet, Take 10 mg by mouth 4 (four) times a week., Disp: , Rfl:  .  torsemide (DEMADEX) 20 MG tablet, Take 20 mg by mouth daily. Take 1 to 2 tablets by mouth daily as needed for swelling., Disp: , Rfl:  .  valsartan-hydrochlorothiazide (DIOVAN-HCT) 160-12.5 MG per tablet, Take 1 tablet by mouth daily. , Disp: , Rfl:  .  Vitamin D, Ergocalciferol, (DRISDOL) 50000 UNITS CAPS capsule, Take 50,000 Units by mouth every 7 (seven) days. Take on Mondays, Disp: , Rfl:  .  zolpidem (AMBIEN) 10 MG tablet, Take 5 mg by mouth daily as needed  for sleep. Reported on 12/29/2015, Disp: , Rfl:   Past Medical History: Past Medical History:  Diagnosis Date  . Anxiety   . Arthritis    Multiple joints  . Chronic diastolic CHF (congestive heart failure) (HCC) 01/16/2016  . Coronary artery disease    a. s/p DES to LAD 2006. b. DES to Cx 2014. c. angiosculpt scoring balloon/DES to LAD 2016. d. DES to RCA 12/2015.  . Fibromyalgia   . GERD (gastroesophageal reflux disease)   . High cholesterol   . History of stomach ulcers    Remotely  . Hypertension   . Lower extremity edema    a. h/o chronic edema L>R.  . Osteoporosis   . PAF (paroxysmal atrial fibrillation) (HCC)     a. 2012 - H/P says atrial flutter, D/c summary says atrial fib - was tx with IV amiodarone, only had a few hours of arrhythmias, no clinical recurrence as of 2017.  [name] Ventricular hypertrophy   . Vertigo 08/27/2006    Tobacco Use: History  Smoking Status  . Former Smoker  . Packs/day: 0.12  . Years: 36.00  . Types: Cigarettes  . Quit date: 12/02/1991  Smokeless Tobacco  . Never Used    Labs: Recent Review Flowsheet Data    Labs for ITP Cardiac and Pulmonary Rehab Latest Ref Rng & Units 04/18/2009 04/07/2010 07/06/2011 11/08/2014 12/30/2015   Cholestrol 0 - 200 mg/dL 409 ATP III CLASSIFICATION: <200     mg/dL   Desirable 811-914  mg/dL   Borderline High >=782    mg/dL   High(H) - - - 956(O)   LDLCALC 0 - 99 mg/dL 130 Total Cholesterol/HDL:CHD Risk Coronary Heart Disease Risk Table Men   Women 1/2 Average Risk   3.4   3.3 Average Risk       5.0   4.4 2 X Average Risk   9.6   7.1 3 X Average Risk  23.4   11.0 Use the calculated Patient Ratio above and the CHD Risk Table to determine the patient's CHD Risk. ATP III CLASSIFICATION (LDL): <100     mg/dL   Optimal 865-784  mg/dL   Near or Above Optimal 130-159  mg/dL   Borderline 696-295  mg/dL   High >284     mg/dL   Very High(H) - - - 132(G)   HDL >40 mg/dL 42 - - - 49   Trlycerides <150 mg/dL 401 - - - 90   Hemoglobin A1c 4.8 - 5.6 % - - - 6.4(H) -   PHART 7.350 - 7.400 - - - - -   PCO2ART 35.0 - 45.0 mmHg - - - - -   HCO3 20.0 - 24.0 mEq/L - - - - -   TCO2 0 - 100 mmol/L - 29 27 26  -   O2SAT % - - - - -      Capillary Blood Glucose: Lab Results  Component Value Date   GLUCAP 91 01/13/2015     Exercise Target Goals:    Exercise Program Goal: Individual exercise prescription set with THRR, safety & activity barriers. Participant demonstrates ability to understand and report RPE using BORG scale, to self-measure pulse accurately, and to acknowledge the importance of the exercise prescription.  Exercise  Prescription Goal: Starting with aerobic activity 30 plus minutes a day, 3 days per week for initial exercise prescription. Provide home exercise prescription and guidelines that participant acknowledges understanding prior to discharge.  Activity Barriers & Risk Stratification:     Activity  Barriers & Cardiac Risk Stratification - 04/17/16 0921      Activity Barriers & Cardiac Risk Stratification   Cardiac Risk Stratification High      6 Minute Walk:     6 Minute Walk    Row Name 04/16/16 1645         6 Minute Walk   Phase Initial     Distance 1248 feet     Walk Time 6 minutes     # of Rest Breaks 0     MPH 2.36     METS 2.06     RPE 11     Perceived Dyspnea  1     VO2 Peak 7.2     Symptoms Yes (comment)     Comments Mild SOB toward end of walk test     Resting HR 68 bpm     Resting BP 126/76     Max Ex. HR 105 bpm     Max Ex. BP 142/72     2 Minute Post BP 112/72        Initial Exercise Prescription:     Initial Exercise Prescription - 04/16/16 1600      Date of Initial Exercise RX and Referring Provider   Date 04/16/16   Referring Provider Verdis Prime iii     Treadmill   MPH 1.7   Minutes 10   METs 2.3     Recumbant Bike   Level 2   Watts 15   Minutes 10   METs 2.46     NuStep   Level 2   Minutes 10   METs 2     Prescription Details   Frequency (times per week) 3   Duration Progress to 30 minutes of continuous aerobic without signs/symptoms of physical distress     Intensity   THRR 40-80% of Max Heartrate 57-114   Ratings of Perceived Exertion 11-13   Perceived Dyspnea 0-4     Progression   Progression Continue to progress workloads to maintain intensity without signs/symptoms of physical distress.     Resistance Training   Training Prescription Yes   Weight 2lbs   Reps 10-12      Perform Capillary Blood Glucose checks as needed.  Exercise Prescription Changes:     Exercise Prescription Changes    Row Name 05/13/16 0700  05/31/16 0900           Exercise Review   Progression Yes Yes        Response to Exercise   Blood Pressure (Admit) 124/60 108/72      Blood Pressure (Exercise) 140/78 136/80      Blood Pressure (Exit) 108/70 123/79      Heart Rate (Admit) 87 bpm 77 bpm      Heart Rate (Exercise) 104 bpm 111 bpm      Heart Rate (Exit) 87 bpm 67 bpm      Rating of Perceived Exertion (Exercise) 12 12      Symptoms none none      Comments Home exercise reviewed on 04/29/16. Home exercise reviewed on 04/29/16.      Duration Progress to 30 minutes of continuous aerobic without signs/symptoms of physical distress Progress to 30 minutes of continuous aerobic without signs/symptoms of physical distress      Intensity THRR unchanged THRR unchanged        Progression   Progression Continue to progress workloads to maintain intensity without signs/symptoms of physical distress. Continue to progress workloads to maintain intensity  without signs/symptoms of physical distress.      Average METs 2.6 2.9        Resistance Training   Training Prescription Yes Yes      Weight 1lb 1lb      Reps 10-12 10-12        Interval Training   Interval Training No No        Treadmill   MPH 2 2.1      Grade 1 2      Minutes 10 10      METs 2.81 3.19        Recumbant Bike   Level 2 2      Watts 21 21      Minutes 10 10      METs 2.63 2.63        NuStep   Level 3 3      Minutes 10 10      METs 2.3 2.6        Home Exercise Plan   Plans to continue exercise at Home Home      Frequency Add 2 additional days to program exercise sessions. Add 2 additional days to program exercise sessions.         Exercise Comments:     Exercise Comments    Row Name 05/15/16 0828 05/31/16 0959         Exercise Comments Off to a good start with exercise. Pt walking 15-20 minutes, 5 dyas/week at home. Doing well with exercise.         Discharge Exercise Prescription (Final Exercise Prescription Changes):     Exercise  Prescription Changes - 05/31/16 0900      Exercise Review   Progression Yes     Response to Exercise   Blood Pressure (Admit) 108/72   Blood Pressure (Exercise) 136/80   Blood Pressure (Exit) 123/79   Heart Rate (Admit) 77 bpm   Heart Rate (Exercise) 111 bpm   Heart Rate (Exit) 67 bpm   Rating of Perceived Exertion (Exercise) 12   Symptoms none   Comments Home exercise reviewed on 04/29/16.   Duration Progress to 30 minutes of continuous aerobic without signs/symptoms of physical distress   Intensity THRR unchanged     Progression   Progression Continue to progress workloads to maintain intensity without signs/symptoms of physical distress.   Average METs 2.9     Resistance Training   Training Prescription Yes   Weight 1lb   Reps 10-12     Interval Training   Interval Training No     Treadmill   MPH 2.1   Grade 2   Minutes 10   METs 3.19     Recumbant Bike   Level 2   Watts 21   Minutes 10   METs 2.63     NuStep   Level 3   Minutes 10   METs 2.6     Home Exercise Plan   Plans to continue exercise at Home   Frequency Add 2 additional days to program exercise sessions.      Nutrition:  Target Goals: Understanding of nutrition guidelines, daily intake of sodium 1500mg , cholesterol 200mg , calories 30% from fat and 7% or less from saturated fats, daily to have 5 or more servings of fruits and vegetables.  Biometrics:     Pre Biometrics - 04/16/16 1643      Pre Biometrics   Height 5\' 6"  (1.676 m)   Weight 224 lb 3.3 oz (101.7 kg)  Waist Circumference 46 inches   Hip Circumference 46 inches   Waist to Hip Ratio 1 %   BMI (Calculated) 36.3   Triceps Skinfold 38 mm   % Body Fat 38.1 %   Grip Strength 27.5 kg   Flexibility 14.5 in   Single Leg Stand 2.75 seconds       Nutrition Therapy Plan and Nutrition Goals:     Nutrition Therapy & Goals - 04/17/16 0944      Nutrition Therapy   Diet Therapeutic Lifestyle Changes     Personal Nutrition  Goals   Personal Goal #1 1-2 lb wt loss/week to a wt loss goal of 6-20 lb at graduation from Cardiac Rehab     Intervention Plan   Intervention Prescribe, educate and counsel regarding individualized specific dietary modifications aiming towards targeted core components such as weight, hypertension, lipid management, diabetes, heart failure and other comorbidities.   Expected Outcomes Short Term Goal: Understand basic principles of dietary content, such as calories, fat, sodium, cholesterol and nutrients.;Long Term Goal: Adherence to prescribed nutrition plan.      Nutrition Discharge: Nutrition Scores:     Nutrition Assessments - 04/17/16 0944      MEDFICTS Scores   Pre Score 18      Nutrition Goals Re-Evaluation:   Psychosocial: Target Goals: Acknowledge presence or absence of depression, maximize coping skills, provide positive support system. Participant is able to verbalize types and ability to use techniques and skills needed for reducing stress and depression.  Initial Review & Psychosocial Screening:     Initial Psych Review & Screening - 04/17/16 0830      Family Dynamics   Good Support System? Yes     Barriers   Psychosocial barriers to participate in program The patient should benefit from training in stress management and relaxation.     Screening Interventions   Interventions Encouraged to exercise      Quality of Life Scores:     Quality of Life - 04/16/16 1644      Quality of Life Scores   Health/Function Pre 18.8 %   Socioeconomic Pre 27 %   Psych/Spiritual Pre 25.71 %   Family Pre 20.4 %   GLOBAL Pre 22 %      PHQ-9: Recent Review Flowsheet Data    Depression screen [facility] 2/9 04/22/2016 03/22/2015 12/21/2014 02/15/2013   Decreased Interest 1  0 0 1   Down, Depressed, Hopeless 0 0 0 1   PHQ - 2 Score 1 0 0 2   Altered sleeping - - - 1   Tired, decreased energy - - - 1   Change in appetite - - - 0   Feeling bad or failure about yourself  - - -  1   Trouble concentrating - - - 1   Moving slowly or fidgety/restless - - - 1   Suicidal thoughts - - - 0   PHQ-9 Score - - - 7      Psychosocial Evaluation and Intervention:   Psychosocial Re-Evaluation:     Psychosocial Re-Evaluation    Row Name 05/17/16 1706 06/13/16 1616           Psychosocial Re-Evaluation   Interventions Encouraged to attend Cardiac Rehabilitation for the exercise Encouraged to attend Cardiac Rehabilitation for the exercise      Continued Psychosocial Services Needed No No         Vocational Rehabilitation: Provide vocational rehab assistance to qualifying candidates.   Vocational Rehab Evaluation & Intervention:  Vocational Rehab - 04/17/16 0919      Initial Vocational Rehab Evaluation & Intervention   Assessment shows need for Vocational Rehabilitation No      Education: Education Goals: Education classes will be provided on a weekly basis, covering required topics. Participant will state understanding/return demonstration of topics presented.  Learning Barriers/Preferences:     Learning Barriers/Preferences - 04/17/16 0915      Learning Barriers/Preferences   Learning Barriers Sight  wears glasses   Learning Preferences Audio;Skilled Demonstration      Education Topics: Count Your Pulse:  -Group instruction provided by verbal instruction, demonstration, patient participation and written materials to support subject.  Instructors address importance of being able to find your pulse and how to count your pulse when at home without a heart monitor.  Patients get hands on experience counting their pulse with staff help and individually.   Heart Attack, Angina, and Risk Factor Modification:  -Group instruction provided by verbal instruction, video, and written materials to support subject.  Instructors address signs and symptoms of angina and heart attacks.    Also discuss risk factors for heart disease and how to make changes to  improve heart health risk factors. Flowsheet Row CARDIAC REHAB PHASE II EXERCISE from 05/29/2016 in [facility] CARDIAC REHAB  Date  04/24/16  Instruction Review Code  2- meets goals/outcomes      Functional Fitness:  -Group instruction provided by verbal instruction, demonstration, patient participation, and written materials to support subject.  Instructors address safety measures for doing things around the house.  Discuss how to get up and down off the floor, how to pick things up properly, how to safely get out of a chair without assistance, and balance training.   Meditation and Mindfulness:  -Group instruction provided by verbal instruction, patient participation, and written materials to support subject.  Instructor addresses importance of mindfulness and meditation practice to help reduce stress and improve awareness.  Instructor also leads participants through a meditation exercise.  Flowsheet Row CARDIAC REHAB PHASE II EXERCISE from 05/29/2016 in [facility] CARDIAC REHAB  Date  05/01/16  Instruction Review Code  2- meets goals/outcomes      Stretching for Flexibility and Mobility:  -Group instruction provided by verbal instruction, patient participation, and written materials to support subject.  Instructors lead participants through series of stretches that are designed to increase flexibility thus improving mobility.  These stretches are additional exercise for major muscle groups that are typically performed during regular warm up and cool down.   Hands Only CPR Anytime:  -Group instruction provided by verbal instruction, video, patient participation and written materials to support subject.  Instructors co-teach with [facility] video for hands only CPR.  Participants get hands on experience with mannequins.   Nutrition I class: Heart Healthy Eating:  -Group instruction provided by PowerPoint slides, verbal discussion, and written materials to  support subject matter. The instructor gives an explanation and review of the Therapeutic Lifestyle Changes diet recommendations, which includes a discussion on lipid goals, dietary fat, sodium, fiber, plant stanol/sterol esters, sugar, and the components of a well-balanced, healthy diet. Flowsheet Row CARDIAC REHAB PHASE II EXERCISE from 05/29/2016 in [facility] CARDIAC REHAB  Date  05/13/16  Educator  RD  Instruction Review Code  Not applicable [Class handouts given]      Nutrition II class: Lifestyle Skills:  -Group instruction provided by PowerPoint slides, verbal discussion, and written materials to support subject matter. The instructor gives  an explanation and review of label reading, grocery shopping for heart health, heart healthy recipe modifications, and ways to make healthier choices when eating out. Flowsheet Row CARDIAC REHAB PHASE II EXERCISE from 05/29/2016 in [facility] CARDIAC REHAB  Date  05/13/16  Educator  RD  Instruction Review Code  Not applicable [class handouts given]      Diabetes Question & Answer:  -Group instruction provided by PowerPoint slides, verbal discussion, and written materials to support subject matter. The instructor gives an explanation and review of diabetes co-morbidities, pre- and post-prandial blood glucose goals, pre-exercise blood glucose goals, signs, symptoms, and treatment of hypoglycemia and hyperglycemia, and foot care basics.   Diabetes Blitz:  -Group instruction provided by PowerPoint slides, verbal discussion, and written materials to support subject matter. The instructor gives an explanation and review of the physiology behind type 1 and type 2 diabetes, diabetes medications and rational behind using different medications, pre- and post-prandial blood glucose recommendations and Hemoglobin A1c goals, diabetes diet, and exercise including blood glucose guidelines for exercising safely.    Portion  Distortion:  -Group instruction provided by PowerPoint slides, verbal discussion, written materials, and food models to support subject matter. The instructor gives an explanation of serving size versus portion size, changes in portions sizes over the last 20 years, and what consists of a serving from each food group.   Stress Management:  -Group instruction provided by verbal instruction, video, and written materials to support subject matter.  Instructors review role of stress in heart disease and how to cope with stress positively.   Flowsheet Row CARDIAC REHAB PHASE II EXERCISE from 05/29/2016 in [facility] CARDIAC REHAB  Date  05/29/16  Instruction Review Code  2- meets goals/outcomes      Exercising on Your Own:  -Group instruction provided by verbal instruction, power point, and written materials to support subject.  Instructors discuss benefits of exercise, components of exercise, frequency and intensity of exercise, and end points for exercise.  Also discuss use of nitroglycerin and activating EMS.  Review options of places to exercise outside of rehab.  Review guidelines for sex with heart disease.   Cardiac Drugs I:  -Group instruction provided by verbal instruction and written materials to support subject.  Instructor reviews cardiac drug classes: antiplatelets, anticoagulants, beta blockers, and statins.  Instructor discusses reasons, side effects, and lifestyle considerations for each drug class.   Cardiac Drugs II:  -Group instruction provided by verbal instruction and written materials to support subject.  Instructor reviews cardiac drug classes: angiotensin converting enzyme inhibitors (ACE-I), angiotensin II receptor blockers (ARBs), nitrates, and calcium channel blockers.  Instructor discusses reasons, side effects, and lifestyle considerations for each drug class.   Anatomy and Physiology of the Circulatory System:  -Group instruction provided by verbal  instruction, video, and written materials to support subject.  Reviews functional anatomy of heart, how it relates to various diagnoses, and what role the heart plays in the overall system. Flowsheet Row CARDIAC REHAB PHASE II EXERCISE from 05/29/2016 in [facility] CARDIAC REHAB  Date  05/08/16  Instruction Review Code  2- meets goals/outcomes      Knowledge Questionnaire Score:     Knowledge Questionnaire Score - 04/16/16 1644      Knowledge Questionnaire Score   Pre Score 21/24      Core Components/Risk Factors/Patient Goals at Admission:     Personal Goals and Risk Factors at Admission - 04/16/16 1649  Core Components/Risk Factors/Patient Goals on Admission    Weight Management Weight Loss;Obesity;Yes   Intervention Weight Management: Develop a combined nutrition and exercise program designed to reach desired caloric intake, while maintaining appropriate intake of nutrient and fiber, sodium and fats, and appropriate energy expenditure required for the weight goal.;Weight Management/Obesity: Establish reasonable short term and long term weight goals.;Obesity: Provide education and appropriate resources to help participant work on and attain dietary goals.   Admit Weight 224 lb 3.3 oz (101.7 kg)   Goal Weight: Short Term 220 lb (99.8 kg)   Goal Weight: Long Term 218 lb (98.9 kg)   Expected Outcomes Weight Loss: Understanding of general recommendations for a balanced deficit meal plan, which promotes 1-2 lb weight loss per week and includes a negative energy balance of [phone] kcal/d;Short Term: Continue to assess and modify interventions until short term weight is achieved;Long Term: Adherence to nutrition and physical activity/exercise program aimed toward attainment of established weight goal   Sedentary Yes   Intervention Provide advice, education, support and counseling about physical activity/exercise needs.;Develop an individualized exercise prescription for  aerobic and resistive training based on initial evaluation findings, risk stratification, comorbidities and participant's personal goals.   Expected Outcomes Achievement of increased cardiorespiratory fitness and enhanced flexibility, muscular endurance and strength shown through measurements of functional capacity and personal statement of participant.   Hypertension Yes   Intervention Provide education on lifestyle modifcations including regular physical activity/exercise, weight management, moderate sodium restriction and increased consumption of fresh fruit, vegetables, and low fat dairy, alcohol moderation, and smoking cessation.;Monitor prescription use compliance.   Expected Outcomes Short Term: Continued assessment and intervention until BP is < 140/42mm HG in hypertensive participants. < 130/36mm HG in hypertensive participants with diabetes, heart failure or chronic kidney disease.;Long Term: Maintenance of blood pressure at goal levels.   Lipids Yes   Intervention Provide education and support for participant on nutrition & aerobic/resistive exercise along with prescribed medications to achieve LDL 70mg , HDL >40mg .   Expected Outcomes Short Term: Participant states understanding of desired cholesterol values and is compliant with medications prescribed. Participant is following exercise prescription and nutrition guidelines.;Long Term: Cholesterol controlled with medications as prescribed, with individualized exercise RX and with personalized nutrition plan. Value goals: LDL < 70mg , HDL > 40 mg.   Personal Goal Other Yes   Personal Goal Get fitness back to be able to do ADLs.   Intervention Provide individulaized aerobic and resistance training program, including stretching, to improve functional fitness, so that patient can do ADLs.   Expected Outcomes Improve functional fitness through regular aerobic exercise 5-7 days per week.      Core Components/Risk Factors/Patient Goals Review:       Goals and Risk Factor Review    Row Name 05/15/16 0829 05/31/16 1000           Core Components/Risk Factors/Patient Goals Review   Personal Goals Review Other Other;Weight Management/Obesity      Review Home exercise reviewed with patient on 04/29/16. Patient feels strength and stamina are improving. Her goal is to walk 5,000 steps /day. Participant has lost 5.7 lbs. Doing well with home exercise routine.      Expected Outcomes Achieve cardiorespiratory improvements through regular aerobic exercise routine. Continue weight loss and improve fitness through regular exercise program.         Core Components/Risk Factors/Patient Goals at Discharge (Final Review):      Goals and Risk Factor Review - 05/31/16 1000  Core Components/Risk Factors/Patient Goals Review   Personal Goals Review Other;Weight Management/Obesity   Review Participant has lost 5.7 lbs. Doing well with home exercise routine.   Expected Outcomes Continue weight loss and improve fitness through regular exercise program.      ITP Comments:     ITP Comments    Row Name 04/16/16 1416           ITP Comments Medical Director- Dr. Armanda Magic          Comments:[name] is making expected progress toward personal goals after completing 23 sessions. Recommend continued exercise and life style modification education including  stress management and relaxation techniques to decrease cardiac risk profile. Gladstone Lighter, RN,BSN 06/13/2016 4:18 PM

## 2016-06-14 ENCOUNTER — Encounter (HOSPITAL_COMMUNITY)
Admission: RE | Admit: 2016-06-14 | Discharge: 2016-06-14 | Disposition: A | Discharge status: Home / Self Care | Payer: Medicare Other | Source: Ambulatory Visit | Attending: Interventional Cardiology | Admitting: Interventional Cardiology

## 2016-06-14 DIAGNOSIS — Z955 Presence of coronary angioplasty implant and graft: Secondary | ICD-10-CM

## 2016-06-17 ENCOUNTER — Encounter (HOSPITAL_COMMUNITY)
Admission: RE | Admit: 2016-06-17 | Discharge: 2016-06-17 | Disposition: A | Discharge status: Home / Self Care | Payer: Medicare Other | Source: Ambulatory Visit | Attending: Interventional Cardiology | Admitting: Interventional Cardiology

## 2016-06-17 DIAGNOSIS — Z955 Presence of coronary angioplasty implant and graft: Secondary | ICD-10-CM | POA: Diagnosis not present

## 2016-06-19 ENCOUNTER — Other Ambulatory Visit: Payer: Self-pay | Admitting: Internal Medicine

## 2016-06-19 ENCOUNTER — Encounter (HOSPITAL_COMMUNITY)
Admission: RE | Admit: 2016-06-19 | Discharge: 2016-06-19 | Disposition: A | Discharge status: Home / Self Care | Payer: Medicare Other | Source: Ambulatory Visit | Attending: Interventional Cardiology | Admitting: Interventional Cardiology

## 2016-06-19 DIAGNOSIS — Z955 Presence of coronary angioplasty implant and graft: Secondary | ICD-10-CM

## 2016-06-19 DIAGNOSIS — E2839 Other primary ovarian failure: Secondary | ICD-10-CM

## 2016-06-21 ENCOUNTER — Encounter (HOSPITAL_COMMUNITY)
Admission: RE | Admit: 2016-06-21 | Discharge: 2016-06-21 | Disposition: A | Discharge status: Home / Self Care | Payer: Medicare Other | Source: Ambulatory Visit | Attending: Interventional Cardiology | Admitting: Interventional Cardiology

## 2016-06-21 DIAGNOSIS — Z955 Presence of coronary angioplasty implant and graft: Secondary | ICD-10-CM | POA: Diagnosis not present

## 2016-06-24 ENCOUNTER — Encounter (HOSPITAL_COMMUNITY)
Admission: RE | Admit: 2016-06-24 | Discharge: 2016-06-24 | Disposition: A | Discharge status: Home / Self Care | Payer: Medicare Other | Source: Ambulatory Visit | Attending: Interventional Cardiology | Admitting: Interventional Cardiology

## 2016-06-24 DIAGNOSIS — Z955 Presence of coronary angioplasty implant and graft: Secondary | ICD-10-CM | POA: Diagnosis not present

## 2016-06-26 ENCOUNTER — Ambulatory Visit (HOSPITAL_BASED_OUTPATIENT_CLINIC_OR_DEPARTMENT_OTHER): Payer: Medicare Other | Attending: Physician Assistant | Admitting: Cardiology

## 2016-06-26 ENCOUNTER — Encounter (HOSPITAL_COMMUNITY)
Admission: RE | Admit: 2016-06-26 | Discharge: 2016-06-26 | Disposition: A | Discharge status: Home / Self Care | Payer: Medicare Other | Source: Ambulatory Visit | Attending: Interventional Cardiology | Admitting: Interventional Cardiology

## 2016-06-26 DIAGNOSIS — I4819 Other persistent atrial fibrillation: Secondary | ICD-10-CM

## 2016-06-26 DIAGNOSIS — Z955 Presence of coronary angioplasty implant and graft: Secondary | ICD-10-CM

## 2016-06-26 DIAGNOSIS — I2510 Atherosclerotic heart disease of native coronary artery without angina pectoris: Secondary | ICD-10-CM

## 2016-06-26 DIAGNOSIS — R5383 Other fatigue: Secondary | ICD-10-CM | POA: Diagnosis present

## 2016-06-26 DIAGNOSIS — R079 Chest pain, unspecified: Secondary | ICD-10-CM

## 2016-06-26 DIAGNOSIS — G4734 Idiopathic sleep related nonobstructive alveolar hypoventilation: Secondary | ICD-10-CM | POA: Diagnosis not present

## 2016-06-26 DIAGNOSIS — I10 Essential (primary) hypertension: Secondary | ICD-10-CM

## 2016-06-26 DIAGNOSIS — I480 Paroxysmal atrial fibrillation: Secondary | ICD-10-CM

## 2016-06-26 DIAGNOSIS — I481 Persistent atrial fibrillation: Secondary | ICD-10-CM | POA: Diagnosis not present

## 2016-06-26 DIAGNOSIS — G4736 Sleep related hypoventilation in conditions classified elsewhere: Secondary | ICD-10-CM | POA: Insufficient documentation

## 2016-06-26 DIAGNOSIS — I5032 Chronic diastolic (congestive) heart failure: Secondary | ICD-10-CM

## 2016-06-28 ENCOUNTER — Encounter: Payer: Self-pay | Admitting: Interventional Cardiology

## 2016-06-28 ENCOUNTER — Encounter (HOSPITAL_COMMUNITY)
Admission: RE | Admit: 2016-06-28 | Discharge: 2016-06-28 | Disposition: A | Discharge status: Home / Self Care | Payer: Medicare Other | Source: Ambulatory Visit | Attending: Interventional Cardiology | Admitting: Interventional Cardiology

## 2016-06-28 DIAGNOSIS — Z955 Presence of coronary angioplasty implant and graft: Secondary | ICD-10-CM | POA: Diagnosis not present

## 2016-06-28 DIAGNOSIS — R0683 Snoring: Secondary | ICD-10-CM | POA: Insufficient documentation

## 2016-06-28 NOTE — Progress Notes (Signed)
Patient was in the office with husband and was given the results of her sleep study.

## 2016-06-28 NOTE — Procedures (Signed)
   Patient Name: [name], [name] Date: 06/26/2016 Gender: Female D.O.B: April 08, 1936 Age (years): 80 Referring Provider: Ronie Spiesayna Dunn Height (inches): 66 Interpreting Physician: Armanda Magicraci Turner MD, ABSM Weight (lbs): 215 RPSGT: Armen PickupFord, Evelyn BMI: 35 MRN: [id number] Neck Size: 16.00  CLINICAL INFORMATION Sleep [name] Type: NPSG Indication for sleep [name]: Fatigue Epworth Sleepiness Score: 12  SLEEP [name] TECHNIQUE As per the AASM Manual for the Scoring of Sleep and Associated Events v2.3 (April 2016) with a hypopnea requiring 4% desaturations. The channels recorded and monitored were frontal, central and occipital EEG, electrooculogram (EOG), submentalis EMG (chin), nasal and oral airflow, thoracic and abdominal wall motion, anterior tibialis EMG, snore microphone, electrocardiogram, and pulse oximetry.  MEDICATIONS Medications self-administered by patient taken the night of the [name] : TYLENOL PM, LYRICA, ALPRAZOLAM  SLEEP ARCHITECTURE The [name] was initiated at 11:01:58 PM and ended at 5:06:51 AM. Sleep onset time was 15.8 minutes and the sleep efficiency was 88.7%. The total sleep time was 323.6 minutes. Stage REM latency was 97.5 minutes. The patient spent 4.79% of the night in stage N1 sleep, 79.14% in stage N2 sleep, 0.62% in stage N3 and 15.45% in REM. Alpha intrusion was absent. Supine sleep was 88.23%.  RESPIRATORY PARAMETERS The overall apnea/hypopnea index (AHI) was 4.4 per hour. There were 3 total apneas, including 3 obstructive, 0 central and 0 mixed apneas. There were 21 hypopneas and 6 RERAs. The AHI during Stage REM sleep was 25.2 per hour. AHI while supine was 5.0 per hour. The mean oxygen saturation was 93.52%. The minimum SpO2 during sleep was 83.00%. Soft snoring was noted during this [name].  CARDIAC DATA The 2 lead EKG demonstrated sinus rhythm. The mean heart rate was 59.04 beats per minute. Other EKG findings include: None.  LEG MOVEMENT DATA The  total PLMS were 0 with a resulting PLMS index of 0.00. Associated arousal with leg movement index was 0.0 .  IMPRESSIONS - No significant obstructive sleep apnea occurred during this [name] (AHI = 4.4/h). - No significant central sleep apnea occurred during this [name] (CAI = 0.0/h). - Mild oxygen desaturation was noted during this [name] (Min O2 = 83.00%). - The patient snored with Soft snoring volume. - No cardiac abnormalities were noted during this [name]. - Clinically significant periodic limb movements did not occur during sleep. No significant associated arousals.  DIAGNOSIS - Nocturnal Hypoxemia (327.26 [G47.36 ICD-10])  RECOMMENDATIONS - Avoid alcohol, sedatives and other CNS depressants that may worsen sleep apnea and disrupt normal sleep architecture. - Sleep hygiene should be reviewed to assess factors that may improve sleep quality. - Weight management and regular exercise should be initiated or continued if appropriate. - Patient overall has no OSA but does have severe OSA during REM sleep and nocturnal hypoxemia.  Therefore, would recommend a trial of autoCPAP for 2 weeks with overnight pulse oximetry during CPAP titration to see if hypoxemia still present.     Armanda Magicraci Turner Diplomate, American Board of Sleep Medicine  ELECTRONICALLY SIGNED ON:  06/28/2016, 9:53 AM [facility] PH: [phone]   FX: [phone] ACCREDITED BY THE AMERICAN ACADEMY OF SLEEP MEDICINE

## 2016-07-01 ENCOUNTER — Encounter (HOSPITAL_COMMUNITY)
Admission: RE | Admit: 2016-07-01 | Discharge: 2016-07-01 | Disposition: A | Discharge status: Home / Self Care | Payer: Medicare Other | Source: Ambulatory Visit | Attending: Interventional Cardiology | Admitting: Interventional Cardiology

## 2016-07-01 DIAGNOSIS — Z955 Presence of coronary angioplasty implant and graft: Secondary | ICD-10-CM | POA: Diagnosis not present

## 2016-07-02 ENCOUNTER — Ambulatory Visit
Admission: RE | Admit: 2016-07-02 | Discharge: 2016-07-02 | Disposition: A | Discharge status: Home / Self Care | Payer: Medicare Other | Source: Ambulatory Visit | Attending: Internal Medicine | Admitting: Internal Medicine

## 2016-07-02 DIAGNOSIS — E2839 Other primary ovarian failure: Secondary | ICD-10-CM

## 2016-07-03 ENCOUNTER — Encounter (HOSPITAL_COMMUNITY)
Admission: RE | Admit: 2016-07-03 | Discharge: 2016-07-03 | Disposition: A | Discharge status: Home / Self Care | Payer: Medicare Other | Source: Ambulatory Visit | Attending: Interventional Cardiology | Admitting: Interventional Cardiology

## 2016-07-03 VITALS — Ht 66.0 in

## 2016-07-03 DIAGNOSIS — Z955 Presence of coronary angioplasty implant and graft: Secondary | ICD-10-CM | POA: Insufficient documentation

## 2016-07-03 NOTE — Progress Notes (Signed)
[name] reported that she had a brief episode of  chest discomfort on Monday evening. [name] said the discomfort in her chest went away. [name] said she experienced some pain in her jaws and cheeks. [name]  Tried  Taking a tylenol. " I didn't wait to see if it worked. I took a sublingual nitroglycerin to see if that helped." I went to sleep and in the morning I did not have any discomfort anymore. [name] PAC called and notified. Appointment scheduled for Catherine BoozerLaura Ingold [name] to see [name] tomorrow per Catherine. Catherine also advised that [name] not exercise today. Vital signs stable. Telemetry rhythm Sinus. Will fax exercise flow sheets to Dr. Michaelle CopasSmith's office for review for Catherine BoozerLaura Ingold [name] with today's ECG tracings.Catherine HeadingsMaria Walden [name] RN BSN

## 2016-07-04 ENCOUNTER — Ambulatory Visit (INDEPENDENT_AMBULATORY_CARE_PROVIDER_SITE_OTHER): Payer: Medicare Other | Admitting: Cardiology

## 2016-07-04 ENCOUNTER — Telehealth (HOSPITAL_COMMUNITY): Payer: Self-pay | Admitting: *Deleted

## 2016-07-04 ENCOUNTER — Encounter: Payer: Self-pay | Admitting: Cardiology

## 2016-07-04 VITALS — BP 118/60 | HR 78 | Ht 65.0 in | Wt 216.4 lb

## 2016-07-04 DIAGNOSIS — R072 Precordial pain: Secondary | ICD-10-CM | POA: Diagnosis not present

## 2016-07-04 MED ORDER — METOPROLOL TARTRATE 25 MG PO TABS: 25.0000 mg | ORAL_TABLET | Freq: Two times a day (BID) | ORAL | 6 refills | Status: DC

## 2016-07-04 NOTE — Addendum Note (Signed)
Addended by: Neoma LamingPUGH, CHERYL J on: 07/04/2016 01:46 PM   Modules accepted: Orders

## 2016-07-04 NOTE — Patient Instructions (Signed)
Increase Metoprolol to 25 mg twice a day   Schedule Stress Myoview   Your physician recommends that you schedule a follow-up appointment in: 2 weeks with Dr.Smith or Robbie LisBrittainy Simmons PA

## 2016-07-04 NOTE — Progress Notes (Signed)
07/04/2016 [name]   05-Dec-1935  [id number]  Primary Physician [name]., MD Primary Cardiologist: Dr. Katrinka Blazing    Reason for Visit/CC: Chest/Jaw Pain   HPI:  The patient is a 80 y/o female, followed by Dr. Katrinka Blazing, with a h/o CAD (s/p DES to LAD 2006, DES to Cx 2014, angiosculpt scoring balloon/DES to LAD 2016, DES to RCA 12/2015), chronic diastolic CHF, HTN, atrial fib/flutter, hyperlipidemia, pre-diabetes, chronic LEE (L>R), fibromyalgia, anxiety, remote h/o stomach ulcers.  She presents to clinic today with a complaint of recurrent SSCP and bilateral jaw pain (anginal equivalent). This occurred several days ago. She is enrolled in cardiac rehab. She had just finished a session. She felt well during her routine but developed symptoms later on during the day. Felt that she may have overworked herself. She had resting SSC pressure/tightness and bilateral jaw pain, reminiscent of her angina in the past. She was a little short of breath but no syncope/ near syncope. She started to go to the ED, however she took a SL NTG and Tylenol. Her pain resolved shortly after this. She denies any further recurrence. She reports full medication compliance. No missed doses of ASA nor Plavix. She is on a low dose BB. Her EKG shows NSR with nonspecific T wave abnormality. Slight Twave flattening compared to prior EKg, but no drastic changes. HR is in the mid 60s. BP is 106/68. She is currently CP free.   Current Meds  Medication Sig  . acetaminophen (TYLENOL) 500 MG tablet Take 500 mg by mouth daily as needed (pain).  [name] albuterol (PROVENTIL HFA;VENTOLIN HFA) 108 (90 Base) MCG/ACT inhaler Inhale 2 puffs into the lungs as directed.  [name] ALPRAZolam (XANAX) 0.25 MG tablet Take 0.25 mg by mouth 2 (two) times daily as needed for anxiety. anxiety  . aspirin EC 81 MG tablet Take 81 mg by mouth daily.    . cetirizine (ZYRTEC) 10 MG tablet Take 10 mg by mouth daily.  . clopidogrel (PLAVIX) 75 MG tablet  Take 1 tablet (75 mg total) by mouth daily.  [name] conjugated estrogens (PREMARIN) vaginal cream Place 1 Applicatorful vaginally daily as needed (dryness). Reported on 12/29/2015  . Diphenhydramine-APAP, sleep, (TYLENOL PM EXTRA STRENGTH PO) Take 1 tablet by mouth at bedtime as needed. For sleep  . fluticasone (FLONASE) 50 MCG/ACT nasal spray Place 2 sprays into both nostrils daily.  [name] HYDROcodone-acetaminophen (NORCO/VICODIN) 5-325 MG per tablet Take 0.5-1 tablets by mouth every 6 (six) hours as needed for moderate pain.  [name] lidocaine (LIDODERM) 5 % Place 1 patch onto the skin daily as needed (pain).   [name] loratadine (CLARITIN) 10 MG tablet Take 10 mg by mouth daily. Reported on 12/29/2015  . LYRICA 75 MG capsule Take 75 mg by mouth at bedtime.   . metoprolol tartrate (LOPRESSOR) 25 MG tablet Take 0.5 tablets (12.5 mg total) by mouth 2 (two) times daily.  . nitroGLYCERIN (NITROSTAT) 0.4 MG SL tablet Place 1 tablet (0.4 mg total) under the tongue every 5 (five) minutes as needed for chest pain (CP or SOB).  . pantoprazole (PROTONIX) 40 MG tablet Take 1 tablet (40 mg total) by mouth daily at 6 (six) AM.  . potassium chloride SA (K-DUR,KLOR-CON) 20 MEQ tablet Take 1 tablet (20 mEq total) by mouth daily.  . rosuvastatin (CRESTOR) 10 MG tablet Take 10 mg by mouth 4 (four) times a week.  . torsemide (DEMADEX) 20 MG tablet Take 20 mg by mouth daily. Take 1 to 2 tablets by mouth  daily as needed for swelling.  . valsartan-hydrochlorothiazide (DIOVAN-HCT) 160-12.5 MG per tablet Take 1 tablet by mouth daily.   . Vitamin D, Ergocalciferol, (DRISDOL) 50000 UNITS CAPS capsule Take 50,000 Units by mouth every 7 (seven) days. Take on Mondays  . zolpidem (AMBIEN) 10 MG tablet Take 5 mg by mouth daily as needed for sleep. Reported on 12/29/2015   Allergies  Allergen Reactions  . Adhesive [Tape] Itching and Rash  . Statins     Atorvastatin 40mg , 20mg , and 10mg , rosuvastatin, simvastatin - myalgias; pitavastatin 2mg    "feels bad". Tolerating Crestor 10mg  4x/week as of 2017  . Codeine Itching    Tolerates low dose of norco  . Elavil [Amitriptyline] Other (See Comments)    Dissociation   Past Medical History:  Diagnosis Date  . Anxiety   . Arthritis    Multiple joints  . Chronic diastolic CHF (congestive heart failure) (HCC) 01/16/2016  . Coronary artery disease    a. s/p DES to LAD 2006. b. DES to Cx 2014. c. angiosculpt scoring balloon/DES to LAD 2016. d. DES to RCA 12/2015.  . Fibromyalgia   . GERD (gastroesophageal reflux disease)   . High cholesterol   . History of stomach ulcers    Remotely  . Hypertension   . Lower extremity edema    a. h/o chronic edema L>R.  . Osteoporosis   . PAF (paroxysmal atrial fibrillation) (HCC)    a. 2012 - H/P says atrial flutter, D/c summary says atrial fib - was tx with IV amiodarone, only had a few hours of arrhythmias, no clinical recurrence as of 2017.  [name]. Ventricular hypertrophy   . Vertigo 08/27/2006   Family History  Problem Relation Age of Onset  . Stroke Mother   . Heart attack Father    Past Surgical History:  Procedure Laterality Date  . ABDOMINAL HYSTERECTOMY  1970's  . BREAST BIOPSY Right 1970's  . BREAST LUMPECTOMY Right 1970's   benign   . CARDIAC CATHETERIZATION  10/2006   [name]/notes 08/03/2007 (01/27/2013)  . CARDIAC CATHETERIZATION N/A 01/01/2016   Procedure: Left Heart Cath and Coronary Angiography;  Surgeon: Kathleene Hazelhristopher D McAlhany, MD;  [facility];  Service: Cardiovascular;  Laterality: N/A;  . CARDIAC CATHETERIZATION N/A 01/01/2016   Procedure: Coronary Stent Intervention;  Surgeon: Kathleene Hazelhristopher D McAlhany, MD;  Location: [facility] INVASIVE CV LAB;  Service: Cardiovascular;  Laterality: N/A;  . CORONARY ANGIOPLASTY WITH STENT PLACEMENT  06/2005; 01/27/2013    2.515 mm Cypher stent to LAD in 2006; 2.75 x 12 Promus DES to the circumflex 2014    . DIAGNOSTIC LAPAROSCOPY  2000   lap abdominal lysis of adhesions  .  ESOPHAGOGASTRODUODENOSCOPY N/A 11/10/2014   Procedure: ESOPHAGOGASTRODUODENOSCOPY (EGD);  Surgeon: Jeani HawkingPatrick Hung, MD;  [facility];  Service: Endoscopy;  Laterality: N/A;  . HERNIA REPAIR    . LAPAROSCOPIC SALPINGO OOPHERECTOMY  2000  . LAPAROTOMY     "day after oophorectomy; had bowel obstruction" (01/27/2013)  . LEFT HEART CATHETERIZATION WITH CORONARY ANGIOGRAM N/A 01/27/2013   Procedure: LEFT HEART CATHETERIZATION WITH CORONARY ANGIOGRAM;  Surgeon: Corky CraftsJayadeep S Varanasi, MD;  [facility];  Service: Cardiovascular;  Laterality: N/A;  . LEFT HEART CATHETERIZATION WITH CORONARY ANGIOGRAM N/A 11/10/2014   Procedure: LEFT HEART CATHETERIZATION WITH CORONARY ANGIOGRAM;  Surgeon: Lennette Biharihomas A Kelly, MD; left main okay, LAD 75% ISR, FFR 0.75, s/p Angiosculpt scoring balloon and 2.7518 mm Resolute DES, CFX 70%, stent patent, RCA 50%  . TONSILLECTOMY AND ADENOIDECTOMY  ~  [id number]   Social History   Social History  . Marital status: Married    Spouse name: N/A  . Number of children: 3  . Years of education: masters   Occupational History  . retired    Social History Main Topics  . Smoking status: Former Smoker    Packs/day: 0.12    Years: 36.00    Types: Cigarettes    Quit date: 12/02/1991  . Smokeless tobacco: Never Used  . Alcohol use No  . Drug use: No  . Sexual activity: Not Currently    Birth control/ protection: Surgical   Other Topics Concern  . Not on file   Social History Narrative  . No narrative on file     Review of Systems: General: negative for chills, fever, night sweats or weight changes.  Cardiovascular: negative for chest pain, dyspnea on exertion, edema, orthopnea, palpitations, paroxysmal nocturnal dyspnea or shortness of breath Dermatological: negative for rash Respiratory: negative for cough or wheezing Urologic: negative for hematuria Abdominal: negative for nausea, vomiting, diarrhea, bright red blood per rectum, melena, or  hematemesis Neurologic: negative for visual changes, syncope, or dizziness All other systems reviewed and are otherwise negative except as noted above.   Physical Exam:  Blood pressure 118/60, pulse 78, height 5\' 5"  (1.651 m), weight 216 lb 6.4 oz (98.2 kg), SpO2 98 %.  General appearance: alert, cooperative and no distress Neck: no carotid bruit and no JVD Lungs: clear to auscultation bilaterally Heart: regular rate and rhythm, S1, S2 normal, no murmur, click, rub or gallop Extremities: extremities normal, atraumatic, no cyanosis or edema Pulses: 2+ and symmetric Skin: Skin color, texture, turgor normal. No rashes or lesions Neurologic: Grossly normal  EKG NSR with nonspecific T wave abnormality. Slight Twave flattening compared to prior EKg, but no drastic changes. HR is in the mid 60s.  ASSESSMENT AND PLAN:   1. Chest Pain with Moderate Risk for Cardiac Etiology/CAD: patient with known h/o multivessel CAD with multiple PCIs in the past, involving the LAD, LCx and RCA. Her most recent PCI was to her RCA in 09/2015. She had an episode of recurrent resting SSCP and bilateral jaw pain, reminiscent of prior episodes that was relieved with SL NTG + Tylenol. No further recurrence. Currently CP free with nonspecifict Twave changes on EKG. We will arrange for an outpatient NST to assess for ischemia and will adjust her antianginals. Will increase metoprolol to 25 mg BID. Her BP will not also tolerate addition of LA nitrate. We will reassess in 1-2 weeks.     Robbie LisBrittainy Simmons PA-C 07/04/2016 12:23 PM

## 2016-07-05 ENCOUNTER — Encounter (HOSPITAL_COMMUNITY): Payer: Medicare Other

## 2016-07-08 ENCOUNTER — Encounter (HOSPITAL_COMMUNITY)
Admission: RE | Admit: 2016-07-08 | Discharge: 2016-07-08 | Disposition: A | Discharge status: Home / Self Care | Payer: Medicare Other | Source: Ambulatory Visit | Attending: Interventional Cardiology | Admitting: Interventional Cardiology

## 2016-07-08 DIAGNOSIS — Z955 Presence of coronary angioplasty implant and graft: Secondary | ICD-10-CM

## 2016-07-08 NOTE — Progress Notes (Signed)
Cardiac Individual Treatment Plan  Patient Details  Name: [name] MRN: [id number] Date of Birth: 12-21-1935 Referring Provider:   Flowsheet Row CARDIAC REHAB PHASE II EXERCISE [facility]16/2016 [facility]  Referring Provider  Verdis Primesmith, henry iii      Initial Encounter Date:  Flowsheet Row CARDIAC REHAB PHASE II EXERCISE from 04/16/2016 in [facility]  Date  04/16/16  Referring Provider  Verdis Primesmith, henry iii      Visit Diagnosis: Status post coronary artery stent placement Pt is making expected progress toward personal goals after completing 31 sessions. Recommend continued exercise and life style modification education including  stress management and relaxation techniques to decrease cardiac risk profile.   Patient's Home Medications on Admission:  Current Outpatient Prescriptions:  .  acetaminophen (TYLENOL) 500 MG tablet, Take 500 mg by mouth daily as needed (pain)., Disp: , Rfl:  .  albuterol (PROVENTIL HFA;VENTOLIN HFA) 108 (90 Base) MCG/ACT inhaler, Inhale 2 puffs into the lungs as directed., Disp: , Rfl:  .  ALPRAZolam (XANAX) 0.25 MG tablet, Take 0.25 mg by mouth 2 (two) times daily as needed for anxiety. anxiety, Disp: , Rfl:  .  aspirin EC 81 MG tablet, Take 81 mg by mouth daily.  , Disp: , Rfl:  .  cetirizine (ZYRTEC) 10 MG tablet, Take 10 mg by mouth daily., Disp: , Rfl:  .  clopidogrel (PLAVIX) 75 MG tablet, Take 1 tablet (75 mg total) by mouth daily., Disp: 30 tablet, Rfl: 11 .  conjugated estrogens (PREMARIN) vaginal cream, Place 1 Applicatorful vaginally daily as needed (dryness). Reported on 12/29/2015, Disp: , Rfl:  .  Diphenhydramine-APAP, sleep, (TYLENOL PM EXTRA STRENGTH PO), Take 1 tablet by mouth at bedtime as needed. For sleep, Disp: , Rfl:  .  fluticasone (FLONASE) 50 MCG/ACT nasal spray, Place 2 sprays into both nostrils daily., Disp: 1 g, Rfl: 2 .  HYDROcodone-acetaminophen (NORCO/VICODIN)  5-325 MG per tablet, Take 0.5-1 tablets by mouth every 6 (six) hours as needed for moderate pain., Disp: 20 tablet, Rfl: 0 .  lidocaine (LIDODERM) 5 %, Place 1 patch onto the skin daily as needed (pain). , Disp: , Rfl:  .  loratadine (CLARITIN) 10 MG tablet, Take 10 mg by mouth daily. Reported on 12/29/2015, Disp: , Rfl:  .  LYRICA 75 MG capsule, Take 75 mg by mouth at bedtime. , Disp: , Rfl:  .  metoprolol tartrate (LOPRESSOR) 25 MG tablet, Take 1 tablet (25 mg total) by mouth 2 (two) times daily., Disp: 60 tablet, Rfl: 6 .  nitroGLYCERIN (NITROSTAT) 0.4 MG SL tablet, Place 1 tablet (0.4 mg total) under the tongue every 5 (five) minutes as needed for chest pain (CP or SOB)., Disp: 25 tablet, Rfl: 2 .  pantoprazole (PROTONIX) 40 MG tablet, Take 1 tablet (40 mg total) by mouth daily at 6 (six) AM., Disp: 30 tablet, Rfl: 6 .  potassium chloride SA (K-DUR,KLOR-CON) 20 MEQ tablet, Take 1 tablet (20 mEq total) by mouth daily., Disp: 90 tablet, Rfl: 3 .  rosuvastatin (CRESTOR) 10 MG tablet, Take 10 mg by mouth 4 (four) times a week., Disp: , Rfl:  .  torsemide (DEMADEX) 20 MG tablet, Take 20 mg by mouth daily. Take 1 to 2 tablets by mouth daily as needed for swelling., Disp: , Rfl:  .  valsartan-hydrochlorothiazide (DIOVAN-HCT) 160-12.5 MG per tablet, Take 1 tablet by mouth daily. , Disp: , Rfl:  .  Vitamin D, Ergocalciferol, (DRISDOL) 50000 UNITS  CAPS capsule, Take 50,000 Units by mouth every 7 (seven) days. Take on Mondays, Disp: , Rfl:  .  zolpidem (AMBIEN) 10 MG tablet, Take 5 mg by mouth daily as needed for sleep. Reported on 12/29/2015, Disp: , Rfl:   Past Medical History: Past Medical History:  Diagnosis Date  . Anxiety   . Arthritis    Multiple joints  . Chronic diastolic CHF (congestive heart failure) (HCC) 01/16/2016  . Coronary artery disease    a. s/p DES to LAD 2006. b. DES to Cx 2014. c. angiosculpt scoring balloon/DES to LAD 2016. d. DES to RCA 12/2015.  . Fibromyalgia   . GERD  (gastroesophageal reflux disease)   . High cholesterol   . History of stomach ulcers    Remotely  . Hypertension   . Lower extremity edema    a. h/o chronic edema L>R.  . Osteoporosis   . PAF (paroxysmal atrial fibrillation) (HCC)    a. 2012 - H/P says atrial flutter, D/c summary says atrial fib - was tx with IV amiodarone, only had a few hours of arrhythmias, no clinical recurrence as of 2017.  [name] Ventricular hypertrophy   . Vertigo 08/27/2006    Tobacco Use: History  Smoking Status  . Former Smoker  . Packs/day: 0.12  . Years: 36.00  . Types: Cigarettes  . Quit date: 12/02/1991  Smokeless Tobacco  . Never Used    Labs: Recent Review Flowsheet Data    Labs for ITP Cardiac and Pulmonary Rehab Latest Ref Rng & Units 04/18/2009 04/07/2010 07/06/2011 11/08/2014 12/30/2015   Cholestrol 0 - 200 mg/dL 454 ATP III CLASSIFICATION: <200     mg/dL   Desirable 098-119  mg/dL   Borderline High >=147    mg/dL   High(H) - - - 829(F)   LDLCALC 0 - 99 mg/dL 621 Total Cholesterol/HDL:CHD Risk Coronary Heart Disease Risk Table Men   Women 1/2 Average Risk   3.4   3.3 Average Risk       5.0   4.4 2 X Average Risk   9.6   7.1 3 X Average Risk  23.4   11.0 Use the calculated Patient Ratio above and the CHD Risk Table to determine the patient's CHD Risk. ATP III CLASSIFICATION (LDL): <100     mg/dL   Optimal 308-657  mg/dL   Near or Above Optimal 130-159  mg/dL   Borderline 846-962  mg/dL   High >952     mg/dL   Very High(H) - - - 841(L)   HDL >40 mg/dL 42 - - - 49   Trlycerides <150 mg/dL 244 - - - 90   Hemoglobin A1c 4.8 - 5.6 % - - - 6.4(H) -   PHART 7.350 - 7.400 - - - - -   PCO2ART 35.0 - 45.0 mmHg - - - - -   HCO3 20.0 - 24.0 mEq/L - - - - -   TCO2 0 - 100 mmol/L - 29 27 26  -   O2SAT % - - - - -      Capillary Blood Glucose: Lab Results  Component Value Date   GLUCAP 91 01/13/2015     Exercise Target Goals:    Exercise Program Goal: Individual exercise prescription set  with THRR, safety & activity barriers. Participant demonstrates ability to understand and report RPE using BORG scale, to self-measure pulse accurately, and to acknowledge the importance of the exercise prescription.  Exercise Prescription Goal: Starting with aerobic activity 30 plus minutes a  day, 3 days per week for initial exercise prescription. Provide home exercise prescription and guidelines that participant acknowledges understanding prior to discharge.  Activity Barriers & Risk Stratification:     Activity Barriers & Cardiac Risk Stratification - 04/17/16 0921      Activity Barriers & Cardiac Risk Stratification   Cardiac Risk Stratification High      6 Minute Walk:     6 Minute Walk    Row Name 04/16/16 1645 07/01/16 1656       6 Minute Walk   Phase Initial Discharge    Distance 1248 feet 1452 feet    Distance % Change  - 16.3 %    Walk Time 6 minutes 6 minutes    # of Rest Breaks 0 0    MPH 2.36 2.75    METS 2.06 2.54    RPE 11 15    Perceived Dyspnea  1 0    VO2 Peak 7.2 8.9    Symptoms Yes (comment) No    Comments Mild SOB toward end of walk test  -    Resting HR 68 bpm 81 bpm    Resting BP 126/76 124/80    Max Ex. HR 105 bpm 110 bpm    Max Ex. BP 142/72 146/82    2 Minute Post BP 112/72 120/72       Initial Exercise Prescription:     Initial Exercise Prescription - 04/16/16 1600      Date of Initial Exercise RX and Referring Provider   Date 04/16/16   Referring Provider Verdis Prime iii     Treadmill   MPH 1.7   Minutes 10   METs 2.3     Recumbant Bike   Level 2   Watts 15   Minutes 10   METs 2.46     NuStep   Level 2   Minutes 10   METs 2     Prescription Details   Frequency (times per week) 3   Duration Progress to 30 minutes of continuous aerobic without signs/symptoms of physical distress     Intensity   THRR 40-80% of Max Heartrate 57-114   Ratings of Perceived Exertion 11-13   Perceived Dyspnea 0-4     Progression    Progression Continue to progress workloads to maintain intensity without signs/symptoms of physical distress.     Resistance Training   Training Prescription Yes   Weight 2lbs   Reps 10-12      Perform Capillary Blood Glucose checks as needed.  Exercise Prescription Changes:      Exercise Prescription Changes    Row Name 05/13/16 0700 05/31/16 0900 06/19/16 1700         Exercise Review   Progression Yes Yes Yes       Response to Exercise   Blood Pressure (Admit) 124/60 108/72 112/65     Blood Pressure (Exercise) 140/78 136/80 102/64     Blood Pressure (Exit) 108/70 123/79 106/64     Heart Rate (Admit) 87 bpm 77 bpm 89 bpm     Heart Rate (Exercise) 104 bpm 111 bpm 116 bpm     Heart Rate (Exit) 87 bpm 67 bpm 89 bpm     Rating of Perceived Exertion (Exercise) 12 12 12      Symptoms none none none     Comments Home exercise reviewed on 04/29/16. Home exercise reviewed on 04/29/16. Home exercise reviewed on 04/29/16.     Duration Progress to 30 minutes of continuous aerobic without  signs/symptoms of physical distress Progress to 30 minutes of continuous aerobic without signs/symptoms of physical distress Progress to 30 minutes of continuous aerobic without signs/symptoms of physical distress     Intensity THRR unchanged THRR unchanged THRR unchanged       Progression   Progression Continue to progress workloads to maintain intensity without signs/symptoms of physical distress. Continue to progress workloads to maintain intensity without signs/symptoms of physical distress. Continue to progress workloads to maintain intensity without signs/symptoms of physical distress.     Average METs 2.6 2.9 3.4       Resistance Training   Training Prescription Yes Yes Yes     Weight 1lb 1lb 2lb     Reps 10-12 10-12 10-12       Interval Training   Interval Training No No No       Treadmill   MPH 2 2.1 2.1     Grade 1 2 2      Minutes 10 10 10      METs 2.81 3.19 3.19       Recumbant Bike    Level 2 2 2      Watts 21 21 21      Minutes 10 10 10      METs 2.63 2.63 2.63       NuStep   Level 3 3 4      Minutes 10 10 10      METs 2.3 2.6 2.8       Home Exercise Plan   Plans to continue exercise at Home Home Home     Frequency Add 2 additional days to program exercise sessions. Add 2 additional days to program exercise sessions. Add 2 additional days to program exercise sessions.        Exercise Comments:      Exercise Comments    Row Name 05/15/16 1610 05/31/16 0959 06/19/16 1723 07/10/16 1228     Exercise Comments Off to a good start with exercise. Pt walking 15-20 minutes, 5 dyas/week at home. Doing well with exercise. Pt is responding well to exercise and increased workloads.  Awaiting stress test, will continue exercise after results from stress test        Discharge Exercise Prescription (Final Exercise Prescription Changes):     Exercise Prescription Changes - 06/19/16 1700      Exercise Review   Progression Yes     Response to Exercise   Blood Pressure (Admit) 112/65   Blood Pressure (Exercise) 102/64   Blood Pressure (Exit) 106/64   Heart Rate (Admit) 89 bpm   Heart Rate (Exercise) 116 bpm   Heart Rate (Exit) 89 bpm   Rating of Perceived Exertion (Exercise) 12   Symptoms none   Comments Home exercise reviewed on 04/29/16.   Duration Progress to 30 minutes of continuous aerobic without signs/symptoms of physical distress   Intensity THRR unchanged     Progression   Progression Continue to progress workloads to maintain intensity without signs/symptoms of physical distress.   Average METs 3.4     Resistance Training   Training Prescription Yes   Weight 2lb   Reps 10-12     Interval Training   Interval Training No     Treadmill   MPH 2.1   Grade 2   Minutes 10   METs 3.19     Recumbant Bike   Level 2   Watts 21   Minutes 10   METs 2.63     NuStep   Level 4   Minutes 10  METs 2.8     Home Exercise Plan   Plans to continue  exercise at Home   Frequency Add 2 additional days to program exercise sessions.      Nutrition:  Target Goals: Understanding of nutrition guidelines, daily intake of sodium 1500mg , cholesterol 200mg , calories 30% from fat and 7% or less from saturated fats, daily to have 5 or more servings of fruits and vegetables.  Biometrics:     Pre Biometrics - 04/16/16 1643      Pre Biometrics   Height 5\' 6"  (1.676 m)   Weight 224 lb 3.3 oz (101.7 kg)   Waist Circumference 46 inches   Hip Circumference 46 inches   Waist to Hip Ratio 1 %   BMI (Calculated) 36.3   Triceps Skinfold 38 mm   % Body Fat 38.1 %   Grip Strength 27.5 kg   Flexibility 14.5 in   Single Leg Stand 2.75 seconds         Post Biometrics - 07/03/16 1228       Post  Biometrics   Height 5\' 6"  (1.676 m)   Waist Circumference 46.5 inches   Hip Circumference 46 inches   Waist to Hip Ratio 1.01 %   Triceps Skinfold 34 mm   % Body Fat 48.5 %   Grip Strength 30 kg   Single Leg Stand 3.21 seconds      Nutrition Therapy Plan and Nutrition Goals:     Nutrition Therapy & Goals - 04/17/16 0944      Nutrition Therapy   Diet Therapeutic Lifestyle Changes     Personal Nutrition Goals   Personal Goal #1 1-2 lb wt loss/week to a wt loss goal of 6-20 lb at graduation from Cardiac Rehab     Intervention Plan   Intervention Prescribe, educate and counsel regarding individualized specific dietary modifications aiming towards targeted core components such as weight, hypertension, lipid management, diabetes, heart failure and other comorbidities.   Expected Outcomes Short Term Goal: Understand basic principles of dietary content, such as calories, fat, sodium, cholesterol and nutrients.;Long Term Goal: Adherence to prescribed nutrition plan.      Nutrition Discharge: Nutrition Scores:     Nutrition Assessments - 04/17/16 0944      MEDFICTS Scores   Pre Score 18      Nutrition Goals  Re-Evaluation:   Psychosocial: Target Goals: Acknowledge presence or absence of depression, maximize coping skills, provide positive support system. Participant is able to verbalize types and ability to use techniques and skills needed for reducing stress and depression.  Initial Review & Psychosocial Screening:     Initial Psych Review & Screening - 04/17/16 0830      Family Dynamics   Good Support System? Yes     Barriers   Psychosocial barriers to participate in program The patient should benefit from training in stress management and relaxation.     Screening Interventions   Interventions Encouraged to exercise      Quality of Life Scores:     Quality of Life - 04/16/16 1644      Quality of Life Scores   Health/Function Pre 18.8 %   Socioeconomic Pre 27 %   Psych/Spiritual Pre 25.71 %   Family Pre 20.4 %   GLOBAL Pre 22 %      PHQ-9: Recent Review Flowsheet Data    Depression screen [facility] 2/9 04/22/2016 03/22/2015 12/21/2014 02/15/2013   Decreased Interest 1  0 0 1   Down, Depressed, Hopeless 0  0 0 1   PHQ - 2 Score 1 0 0 2   Altered sleeping - - - 1   Tired, decreased energy - - - 1   Change in appetite - - - 0   Feeling bad or failure about yourself  - - - 1   Trouble concentrating - - - 1   Moving slowly or fidgety/restless - - - 1   Suicidal thoughts - - - 0   PHQ-9 Score - - - 7      Psychosocial Evaluation and Intervention:   Psychosocial Re-Evaluation:     Psychosocial Re-Evaluation    Row Name 05/17/16 1706 06/13/16 1616 07/08/16 0859         Psychosocial Re-Evaluation   Interventions Encouraged to attend Cardiac Rehabilitation for the exercise Encouraged to attend Cardiac Rehabilitation for the exercise Encouraged to attend Cardiac Rehabilitation for the exercise     Continued Psychosocial Services Needed No No No        Vocational Rehabilitation: Provide vocational rehab assistance to qualifying candidates.   Vocational Rehab Evaluation  & Intervention:     Vocational Rehab - 04/17/16 0919      Initial Vocational Rehab Evaluation & Intervention   Assessment shows need for Vocational Rehabilitation No      Education: Education Goals: Education classes will be provided on a weekly basis, covering required topics. Participant will state understanding/return demonstration of topics presented.  Learning Barriers/Preferences:     Learning Barriers/Preferences - 04/17/16 0915      Learning Barriers/Preferences   Learning Barriers Sight  wears glasses   Learning Preferences Audio;Skilled Demonstration      Education Topics: Count Your Pulse:  -Group instruction provided by verbal instruction, demonstration, patient participation and written materials to support subject.  Instructors address importance of being able to find your pulse and how to count your pulse when at home without a heart monitor.  Patients get hands on experience counting their pulse with staff help and individually.   Heart Attack, Angina, and Risk Factor Modification:  -Group instruction provided by verbal instruction, video, and written materials to support subject.  Instructors address signs and symptoms of angina and heart attacks.    Also discuss risk factors for heart disease and how to make changes to improve heart health risk factors. Flowsheet Row CARDIAC REHAB PHASE II EXERCISE from 06/19/2016 in [facility] CARDIAC REHAB  Date  06/19/16  Instruction Review Code  2- meets goals/outcomes      Functional Fitness:  -Group instruction provided by verbal instruction, demonstration, patient participation, and written materials to support subject.  Instructors address safety measures for doing things around the house.  Discuss how to get up and down off the floor, how to pick things up properly, how to safely get out of a chair without assistance, and balance training.   Meditation and Mindfulness:  -Group instruction  provided by verbal instruction, patient participation, and written materials to support subject.  Instructor addresses importance of mindfulness and meditation practice to help reduce stress and improve awareness.  Instructor also leads participants through a meditation exercise.  Flowsheet Row CARDIAC REHAB PHASE II EXERCISE from 06/19/2016 in [facility] CARDIAC REHAB  Date  05/01/16  Instruction Review Code  2- meets goals/outcomes      Stretching for Flexibility and Mobility:  -Group instruction provided by verbal instruction, patient participation, and written materials to support subject.  Instructors lead participants through series of stretches that are designed  to increase flexibility thus improving mobility.  These stretches are additional exercise for major muscle groups that are typically performed during regular warm up and cool down.   Hands Only CPR Anytime:  -Group instruction provided by verbal instruction, video, patient participation and written materials to support subject.  Instructors co-teach with [facility] video for hands only CPR.  Participants get hands on experience with mannequins.   Nutrition I class: Heart Healthy Eating:  -Group instruction provided by PowerPoint slides, verbal discussion, and written materials to support subject matter. The instructor gives an explanation and review of the Therapeutic Lifestyle Changes diet recommendations, which includes a discussion on lipid goals, dietary fat, sodium, fiber, plant stanol/sterol esters, sugar, and the components of a well-balanced, healthy diet. Flowsheet Row CARDIAC REHAB PHASE II EXERC[facility]  Date  05/13/16  Educator  RD  Instruction Review Code  Not applicable [Class handouts given]      Nutrition II class: Lifestyle Skills:  -Group instruction provided by PowerPoint slides, verbal discussion, and written materials to support subject  matter. The instructor gives an explanation and review of label reading, grocery shopping for heart health, heart healthy recipe modifications, and ways to make healthier choices when eating out. Flowsheet Row CARDIAC REHAB PHASE [facility]  Date  05/13/16  Educator  RD  Instruction Review Code  Not applicable [class handouts given]      Diabetes Question & Answer:  -Group instruction provided by PowerPoint slides, verbal discussion, and written materials to support subject matter. The instructor gives an explanation and review of diabetes co-morbidities, pre- and post-prandial blood glucose goals, pre-exercise blood glucose goals, signs, symptoms, and treatment of hypoglycemia and hyperglycemia, and foot care basics.   Diabetes Blitz:  -Group instruction provided by PowerPoint slides, verbal discussion, and written materials to support subject matter. The instructor gives an explanation and review of the physiology behind type 1 and type 2 diabetes, diabetes medications and rational behind using different medications, pre- and post-prandial blood glucose recommendations and Hemoglobin A1c goals, diabetes diet, and exercise including blood glucose guidelines for exercising safely.    Portion Distortion:  -Group instruction provided by PowerPoint slides, verbal discussion, written materials, and food models to support subject matter. The instructor gives an explanation of serving size versus portion size, changes in portions sizes over the last 20 years, and what consists of a serving from each food group.   Stress Management:  -Group instruction provided by verbal instruction, video, and written materials to support subject matter.  Instructors review role of stress in heart disease and how to cope with stress positively.   Flowsheet Row CARDIAC REHAB PHASE II EXERCISE [facility]   Date  05/29/16  Instruction Review Code  2- meets goals/outcomes      Exercising on Your Own:  -Group instruction provided by verbal instruction, power point, and written materials to support subject.  Instructors discuss benefits of exercise, components of exercise, frequency and intensity of exercise, and end points for exercise.  Also discuss use of nitroglycerin and activating EMS.  Review options of places to exercise outside of rehab.  Review guidelines for sex with heart disease.   Cardiac Drugs I:  -Group instruction provided by verbal instruction and written materials to support subject.  Instructor reviews cardiac drug classes: antiplatelets, anticoagulants, beta blockers, and statins.  Instructor discusses reasons, side effects, and  lifestyle considerations for each drug class.   Cardiac Drugs II:  -Group instruction provided by verbal instruction and written materials to support subject.  Instructor reviews cardiac drug classes: angiotensin converting enzyme inhibitors (ACE-I), angiotensin II receptor blockers (ARBs), nitrates, and calcium channel blockers.  Instructor discusses reasons, side effects, and lifestyle considerations for each drug class.   Anatomy and Physiology of the Circulatory System:  -Group instruction provided by verbal instruction, video, and written materials to support subject.  Reviews functional anatomy of heart, how it relates to various diagnoses, and what role the heart plays in the overall system. Flowsheet Row CARDIAC REHAB PHASE II EXERCISE from 06/19/2016 in [facility] CARDIAC REHAB  Date  05/08/16  Instruction Review Code  2- meets goals/outcomes      Knowledge Questionnaire Score:     Knowledge Questionnaire Score - 04/16/16 1644      Knowledge Questionnaire Score   Pre Score 21/24      Core Components/Risk Factors/Patient Goals at Admission:     Personal Goals and Risk Factors at Admission - 04/16/16 1649       Core Components/Risk Factors/Patient Goals on Admission    Weight Management Weight Loss;Obesity;Yes   Intervention Weight Management: Develop a combined nutrition and exercise program designed to reach desired caloric intake, while maintaining appropriate intake of nutrient and fiber, sodium and fats, and appropriate energy expenditure required for the weight goal.;Weight Management/Obesity: Establish reasonable short term and long term weight goals.;Obesity: Provide education and appropriate resources to help participant work on and attain dietary goals.   Admit Weight 224 lb 3.3 oz (101.7 kg)   Goal Weight: Short Term 220 lb (99.8 kg)   Goal Weight: Long Term 218 lb (98.9 kg)   Expected Outcomes Weight Loss: Understanding of general recommendations for a balanced deficit meal plan, which promotes 1-2 lb weight loss per week and includes a negative energy balance of [phone] kcal/d;Short Term: Continue to assess and modify interventions until short term weight is achieved;Long Term: Adherence to nutrition and physical activity/exercise program aimed toward attainment of established weight goal   Sedentary Yes   Intervention Provide advice, education, support and counseling about physical activity/exercise needs.;Develop an individualized exercise prescription for aerobic and resistive training based on initial evaluation findings, risk stratification, comorbidities and participant's personal goals.   Expected Outcomes Achievement of increased cardiorespiratory fitness and enhanced flexibility, muscular endurance and strength shown through measurements of functional capacity and personal statement of participant.   Hypertension Yes   Intervention Provide education on lifestyle modifcations including regular physical activity/exercise, weight management, moderate sodium restriction and increased consumption of fresh fruit, vegetables, and low fat dairy, alcohol moderation, and smoking cessation.;Monitor  prescription use compliance.   Expected Outcomes Short Term: Continued assessment and intervention until BP is < 140/72mm HG in hypertensive participants. < 130/4mm HG in hypertensive participants with diabetes, heart failure or chronic kidney disease.;Long Term: Maintenance of blood pressure at goal levels.   Lipids Yes   Intervention Provide education and support for participant on nutrition & aerobic/resistive exercise along with prescribed medications to achieve LDL 70mg , HDL >40mg .   Expected Outcomes Short Term: Participant states understanding of desired cholesterol values and is compliant with medications prescribed. Participant is following exercise prescription and nutrition guidelines.;Long Term: Cholesterol controlled with medications as prescribed, with individualized exercise RX and with personalized nutrition plan. Value goals: LDL < 70mg , HDL > 40 mg.   Personal Goal Other Yes   Personal Goal Get fitness back to  be able to do ADLs.   Intervention Provide individulaized aerobic and resistance training program, including stretching, to improve functional fitness, so that patient can do ADLs.   Expected Outcomes Improve functional fitness through regular aerobic exercise 5-7 days per week.      Core Components/Risk Factors/Patient Goals Review:      Goals and Risk Factor Review    Row Name 05/15/16 0829 05/31/16 1000 06/19/16 1724         Core Components/Risk Factors/Patient Goals Review   Personal Goals Review Other Other;Weight Management/Obesity Weight Management/Obesity;Other     Review Home exercise reviewed with patient on 04/29/16. Patient feels strength and stamina are improving. Her goal is to walk 5,000 steps /day. Participant has lost 5.7 lbs. Doing well with home exercise routine. Pt has maintanined her previous weight loss, states her energy level has increased and she is able to do more w/o SOB.  Doing well w/ exercise program     Expected Outcomes Achieve  cardiorespiratory improvements through regular aerobic exercise routine. Continue weight loss and improve fitness through regular exercise program. Continue weight loss and improve fitness through regular exercise program.        Core Components/Risk Factors/Patient Goals at Discharge (Final Review):      Goals and Risk Factor Review - 06/19/16 1724      Core Components/Risk Factors/Patient Goals Review   Personal Goals Review Weight Management/Obesity;Other   Review Pt has maintanined her previous weight loss, states her energy level has increased and she is able to do more w/o SOB.  Doing well w/ exercise program   Expected Outcomes Continue weight loss and improve fitness through regular exercise program.      ITP Comments:     ITP Comments    Row Name 04/16/16 1416           ITP Comments Medical Director- Dr. Armanda Magic          Comments: Myrene Buddy is making expected progress toward personal goals after completing 23 sessions. Recommend continued exercise and life style modification education including  stress management and relaxation techniques to decrease cardiac risk profile. [name]'s cardiac rehab is on hold currently pending completion and results of her upcoming myocardiac perfusion study this Friday.Thayer Headings RN BSN

## 2016-07-09 ENCOUNTER — Telehealth (HOSPITAL_COMMUNITY): Payer: Self-pay | Admitting: *Deleted

## 2016-07-09 NOTE — Telephone Encounter (Signed)
Left message on voicemail per DPR in reference to upcoming appointment scheduled on 07/12/16 with detailed instructions given per Myocardial Perfusion Study Information Sheet for the test. LM to arrive 15 minutes early, and that it is imperative to arrive on time for appointment to keep from having the test rescheduled. If you need to cancel or reschedule your appointment, please call the office within 24 hours of your appointment. Failure to do so may result in a cancellation of your appointment, and a $50 no show fee. Phone number given for call back for any questions. Antionette CharMary J Aujanae Mccullum, RN

## 2016-07-10 ENCOUNTER — Encounter (HOSPITAL_COMMUNITY): Payer: Medicare Other

## 2016-07-12 ENCOUNTER — Ambulatory Visit (HOSPITAL_COMMUNITY): Payer: Medicare Other | Attending: Cardiovascular Disease

## 2016-07-12 ENCOUNTER — Encounter (HOSPITAL_COMMUNITY): Payer: Medicare Other

## 2016-07-12 DIAGNOSIS — I10 Essential (primary) hypertension: Secondary | ICD-10-CM | POA: Diagnosis not present

## 2016-07-12 DIAGNOSIS — R072 Precordial pain: Secondary | ICD-10-CM

## 2016-07-12 DIAGNOSIS — R079 Chest pain, unspecified: Secondary | ICD-10-CM | POA: Insufficient documentation

## 2016-07-12 DIAGNOSIS — I2510 Atherosclerotic heart disease of native coronary artery without angina pectoris: Secondary | ICD-10-CM | POA: Insufficient documentation

## 2016-07-12 DIAGNOSIS — I4891 Unspecified atrial fibrillation: Secondary | ICD-10-CM | POA: Diagnosis not present

## 2016-07-12 LAB — MYOCARDIAL PERFUSION IMAGING
CHL CUP NUCLEAR SRS: 3
CHL CUP RESTING HR STRESS: 73 {beats}/min
CSEPPHR: 153 {beats}/min
LV sys vol: 12 mL
LVDIAVOL: 42 mL (ref 46–106)
RATE: 0.36
SDS: 2
SSS: 5
TID: 0.8

## 2016-07-12 MED ORDER — TECHNETIUM TC 99M TETROFOSMIN IV KIT
31.8000 | PACK | Freq: Once | INTRAVENOUS | Status: AC | PRN
  Administered 2016-07-12: 31.8 via INTRAVENOUS
  Filled 2016-07-12: qty 32

## 2016-07-12 MED ORDER — TECHNETIUM TC 99M TETROFOSMIN IV KIT
10.7000 | PACK | Freq: Once | INTRAVENOUS | Status: AC | PRN
  Administered 2016-07-12: 10.7 via INTRAVENOUS
  Filled 2016-07-12: qty 11

## 2016-07-15 ENCOUNTER — Encounter (HOSPITAL_COMMUNITY)
Admission: RE | Admit: 2016-07-15 | Discharge: 2016-07-15 | Disposition: A | Discharge status: Home / Self Care | Payer: Medicare Other | Source: Ambulatory Visit | Attending: Interventional Cardiology | Admitting: Interventional Cardiology

## 2016-07-16 ENCOUNTER — Telehealth: Payer: Self-pay | Admitting: Interventional Cardiology

## 2016-07-16 NOTE — Telephone Encounter (Signed)
Returned pts call.  We discussed her stress test results. Pt verbalized understanding.

## 2016-07-16 NOTE — Telephone Encounter (Signed)
Pt returned call to YahooJennifer W. -pls call

## 2016-07-17 ENCOUNTER — Encounter (HOSPITAL_COMMUNITY): Payer: Medicare Other

## 2016-07-19 ENCOUNTER — Ambulatory Visit (INDEPENDENT_AMBULATORY_CARE_PROVIDER_SITE_OTHER): Payer: Medicare Other | Admitting: Cardiology

## 2016-07-19 ENCOUNTER — Encounter (HOSPITAL_COMMUNITY): Admission: RE | Admit: 2016-07-19 | Payer: Medicare Other | Source: Ambulatory Visit

## 2016-07-19 ENCOUNTER — Encounter: Payer: Self-pay | Admitting: Cardiology

## 2016-07-19 VITALS — BP 110/64 | HR 70 | Ht 65.0 in | Wt 217.9 lb

## 2016-07-19 DIAGNOSIS — I2510 Atherosclerotic heart disease of native coronary artery without angina pectoris: Secondary | ICD-10-CM | POA: Diagnosis not present

## 2016-07-19 NOTE — Patient Instructions (Signed)
Ok to return to cardiac rehab Continue current meds. No change.   You will be due back for f/u with Dr. Katrinka BlazingSmith in April.

## 2016-07-19 NOTE — Progress Notes (Signed)
07/19/2016 [name]   May 29, 1936  [id number]  Primary Physician [name]., MD Primary Cardiologist: Dr. Katrinka Blazing   Reason for Visit/CC: f/u for chest Pain  HPI:  The patient is a 80 y/o female, followed by Dr. Katrinka Blazing, with a h/o CAD (s/p DES to LAD 2006, DES to Cx 2014, angiosculpt scoring balloon/DES to LAD 2016, DES to RCA 12/2015), chronic diastolic CHF, HTN, atrial fib/flutter, hyperlipidemia, pre-diabetes, chronic LEE (L>R), fibromyalgia, anxiety, remote h/o stomach ulcers.  She presented to clinic 2 weeks ago with a complaint of recurrent SSCP and bilateral jaw pain (anginal equivalent). This occurred several days ago. She is enrolled in cardiac rehab. She had just finished a session. She felt well during her routine but developed symptoms later on during the day. Felt that she may have overworked herself. She had resting SSC pressure/tightness and bilateral jaw pain, reminiscent of her angina in the past. She was a little short of breath but no syncope/ near syncope. She started to go to the ED, however she took a SL NTG and Tylenol. Her pain resolved shortly after this. She denies any further recurrence. She reports full medication compliance. No missed doses of ASA nor Plavix. She is on a low dose BB. Her EKG shows NSR with nonspecific T wave abnormality. Slight Twave flattening compared to prior EKg, but no drastic changes. She was w/o CP at that visit. I elected to order a NST and increased her metoprolol to 25 mg BID.  She presents back for f/u. She reports that she has done well. No recurrent CP. No dyspnea. She is tolerating the higher dose of metoprolol well w/o side effects. HR and BP are both stable. Her stress test was completed on 07/12/16. This was a low risk stress test. No ischemia. EF was normal.   Current Meds  Medication Sig  . acetaminophen (TYLENOL) 500 MG tablet Take 500 mg by mouth daily as needed (pain).  [name] albuterol (PROVENTIL HFA;VENTOLIN HFA)  108 (90 Base) MCG/ACT inhaler Inhale 2 puffs into the lungs as directed.  [name] ALPRAZolam (XANAX) 0.25 MG tablet Take 0.25 mg by mouth 2 (two) times daily as needed for anxiety. anxiety  . aspirin EC 81 MG tablet Take 81 mg by mouth daily.    . cetirizine (ZYRTEC) 10 MG tablet Take 10 mg by mouth daily.  . clopidogrel (PLAVIX) 75 MG tablet Take 1 tablet (75 mg total) by mouth daily.  [name] conjugated estrogens (PREMARIN) vaginal cream Place 1 Applicatorful vaginally daily as needed (dryness). Reported on 12/29/2015  . Diphenhydramine-APAP, sleep, (TYLENOL PM EXTRA STRENGTH PO) Take 1 tablet by mouth at bedtime as needed. For sleep  . fluticasone (FLONASE) 50 MCG/ACT nasal spray Place 2 sprays into both nostrils daily.  [name] HYDROcodone-acetaminophen (NORCO/VICODIN) 5-325 MG per tablet Take 0.5-1 tablets by mouth every 6 (six) hours as needed for moderate pain.  [name] lidocaine (LIDODERM) 5 % Place 1 patch onto the skin daily as needed (pain).   [name] loratadine (CLARITIN) 10 MG tablet Take 10 mg by mouth daily. Reported on 12/29/2015  . LYRICA 75 MG capsule Take 75 mg by mouth at bedtime.   . metoprolol tartrate (LOPRESSOR) 25 MG tablet Take 1 tablet (25 mg total) by mouth 2 (two) times daily.  . pantoprazole (PROTONIX) 40 MG tablet Take 1 tablet (40 mg total) by mouth daily at 6 (six) AM.  . potassium chloride SA (K-DUR,KLOR-CON) 20 MEQ tablet Take 1 tablet (20 mEq total) by mouth daily.  [name]  Probiotic Product (ALIGN PO) Take 1 tablet by mouth daily.  . rosuvastatin (CRESTOR) 10 MG tablet Take 10 mg by mouth 4 (four) times a week.  . torsemide (DEMADEX) 20 MG tablet Take 20 mg by mouth daily. Take 1 to 2 tablets by mouth daily as needed for swelling.  . valsartan-hydrochlorothiazide (DIOVAN-HCT) 160-12.5 MG per tablet Take 1 tablet by mouth daily.   . Vitamin D, Ergocalciferol, (DRISDOL) 50000 UNITS CAPS capsule Take 50,000 Units by mouth every 7 (seven) days. Take on Mondays   Allergies  Allergen Reactions  .  Adhesive [Tape] Itching and Rash  . Statins     Atorvastatin 40mg , 20mg , and 10mg , rosuvastatin, simvastatin - myalgias; pitavastatin 2mg   "feels bad". Tolerating Crestor 10mg  4x/week as of 2017  . Codeine Itching    Tolerates low dose of norco  . Elavil [Amitriptyline] Other (See Comments)    Dissociation   Past Medical History:  Diagnosis Date  . Anxiety   . Arthritis    Multiple joints  . Chronic diastolic CHF (congestive heart failure) (HCC) 01/16/2016  . Coronary artery disease    a. s/p DES to LAD 2006. b. DES to Cx 2014. c. angiosculpt scoring balloon/DES to LAD 2016. d. DES to RCA 12/2015.  . Fibromyalgia   . GERD (gastroesophageal reflux disease)   . High cholesterol   . History of stomach ulcers    Remotely  . Hypertension   . Lower extremity edema    a. h/o chronic edema L>R.  . Osteoporosis   . PAF (paroxysmal atrial fibrillation) (HCC)    a. 2012 - H/P says atrial flutter, D/c summary says atrial fib - was tx with IV amiodarone, only had a few hours of arrhythmias, no clinical recurrence as of 2017.  [name]. Ventricular hypertrophy   . Vertigo 08/27/2006   Family History  Problem Relation Age of Onset  . Stroke Mother   . Heart attack Father    Past Surgical History:  Procedure Laterality Date  . ABDOMINAL HYSTERECTOMY  1970's  . BREAST BIOPSY Right 1970's  . BREAST LUMPECTOMY Right 1970's   benign   . CARDIAC CATHETERIZATION  10/2006   [name]/notes 08/03/2007 (01/27/2013)  . CARDIAC CATHETERIZATION N/A 01/01/2016   Procedure: Left Heart Cath and Coronary Angiography;  Surgeon: Kathleene Hazelhristopher D McAlhany, MD;  [facility];  Service: Cardiovascular;  Laterality: N/A;  . CARDIAC CATHETERIZATION N/A 01/01/2016   Procedure: Coronary Stent Intervention;  Surgeon: Kathleene Hazelhristopher D McAlhany, MD;  Location: [facility] INVASIVE CV LAB;  Service: Cardiovascular;  Laterality: N/A;  . CORONARY ANGIOPLASTY WITH STENT PLACEMENT  06/2005; 01/27/2013    2.515 mm Cypher stent to LAD in 2006;  2.75 x 12 Promus DES to the circumflex 2014    . DIAGNOSTIC LAPAROSCOPY  2000   lap abdominal lysis of adhesions  . ESOPHAGOGASTRODUODENOSCOPY N/A 11/10/2014   Procedure: ESOPHAGOGASTRODUODENOSCOPY (EGD);  Surgeon: Jeani HawkingPatrick Hung, MD;  [facility];  Service: Endoscopy;  Laterality: N/A;  . HERNIA REPAIR    . LAPAROSCOPIC SALPINGO OOPHERECTOMY  2000  . LAPAROTOMY     "day after oophorectomy; had bowel obstruction" (01/27/2013)  . LEFT HEART CATHETERIZATION WITH CORONARY ANGIOGRAM N/A 01/27/2013   Procedure: LEFT HEART CATHETERIZATION WITH CORONARY ANGIOGRAM;  Surgeon: Corky CraftsJayadeep S Varanasi, MD;  [facility];  Service: Cardiovascular;  Laterality: N/A;  . LEFT HEART CATHETERIZATION WITH CORONARY ANGIOGRAM N/A 11/10/2014   Procedure: LEFT HEART CATHETERIZATION WITH CORONARY ANGIOGRAM;  Surgeon: Lennette Biharihomas A Kelly, MD; left main okay,  LAD 75% ISR, FFR 0.75, s/p Angiosculpt scoring balloon and 2.7518 mm Resolute DES, CFX 70%, stent patent, RCA 50%  . TONSILLECTOMY AND ADENOIDECTOMY  ~ 471952   Social History   Social History  . Marital status: Married    Spouse name: N/A  . Number of children: 3  . Years of education: masters   Occupational History  . retired    Social History Main Topics  . Smoking status: Former Smoker    Packs/day: 0.12    Years: 36.00    Types: Cigarettes    Quit date: 12/02/1991  . Smokeless tobacco: Never Used  . Alcohol use No  . Drug use: No  . Sexual activity: Not Currently    Birth control/ protection: Surgical   Other Topics Concern  . Not on file   Social History Narrative  . No narrative on file     Review of Systems: General: negative for chills, fever, night sweats or weight changes.  Cardiovascular: negative for chest pain, dyspnea on exertion, edema, orthopnea, palpitations, paroxysmal nocturnal dyspnea or shortness of breath Dermatological: negative for rash Respiratory: negative for cough or wheezing Urologic: negative for  hematuria Abdominal: negative for nausea, vomiting, diarrhea, bright red blood per rectum, melena, or hematemesis Neurologic: negative for visual changes, syncope, or dizziness All other systems reviewed and are otherwise negative except as noted above.   Physical Exam:  Blood pressure 110/64, pulse 70, height 5\' 5"  (1.651 m), weight 217 lb 14.2 oz (98.8 kg), SpO2 98 %.  General appearance: alert, cooperative and no distress Neck: no carotid bruit and no JVD Lungs: clear to auscultation bilaterally Heart: regular rate and rhythm, S1, S2 normal, no murmur, click, rub or gallop Extremities: extremities normal, atraumatic, no cyanosis or edema Pulses: 2+ and symmetric Skin: Skin color, texture, turgor normal. No rashes or lesions Neurologic: Grossly normal  EKG not preformed   NST 07/12/16 Study Highlights     Nuclear stress EF: 71%.  The study is normal.  This is a low risk study.  The left ventricular ejection fraction is hyperdynamic (>65%).   Normal exercise nuclear stress test with no evidence of infarct or ischemia.  Good exercise capacity. Normal BP response to stress.       ASSESSMENT AND PLAN:   1. CAD: patient with known h/o multivessel CAD with multiple PCIs in the past, involving the LAD, LCx and RCA. Her most recent PCI was to her RCA in 09/2015. She had a recent episode of recurrent resting SSCP and bilateral jaw pain, reminiscent of prior episodes that was relieved with SL NTG + Tylenol. No further recurrence since increasing her metoprolol to 25 mg BID. NST on 07/12/16 was low risk and negative for ischemia. She remains asymptomatic. HR and BP both stable. Continue medical therapy for secondary prevention, ASA, Plavix, metoprolol, Crestor and Diovan.   PLAN  Continue medical therapy for CAD. No change in regimen made today. Keep f/u with Dr. Katrinka BlazingSmith in April. Ok to resume Cardiac Rehab.   Robbie LisBrittainy Bemnet Trovato PA-C 07/19/2016 3:40 PM

## 2016-07-22 ENCOUNTER — Encounter (HOSPITAL_COMMUNITY)
Admission: RE | Admit: 2016-07-22 | Discharge: 2016-07-22 | Disposition: A | Discharge status: Home / Self Care | Payer: Medicare Other | Source: Ambulatory Visit | Attending: Interventional Cardiology | Admitting: Interventional Cardiology

## 2016-07-22 DIAGNOSIS — Z955 Presence of coronary angioplasty implant and graft: Secondary | ICD-10-CM | POA: Diagnosis present

## 2016-07-22 NOTE — Progress Notes (Signed)
[name] returned to exercise at cardiac rehab and exercised without difficulty today. Medications reconciled.Catherine LighterMaria Maanasa Aderhold, RN,BSN 07/22/2016 11:11 AM

## 2016-07-24 ENCOUNTER — Encounter (HOSPITAL_COMMUNITY)
Admission: RE | Admit: 2016-07-24 | Discharge: 2016-07-24 | Disposition: A | Discharge status: Home / Self Care | Payer: Medicare Other | Source: Ambulatory Visit | Attending: Interventional Cardiology | Admitting: Interventional Cardiology

## 2016-07-24 DIAGNOSIS — Z955 Presence of coronary angioplasty implant and graft: Secondary | ICD-10-CM

## 2016-07-26 ENCOUNTER — Encounter (HOSPITAL_COMMUNITY): Payer: Medicare Other

## 2016-07-29 ENCOUNTER — Encounter (HOSPITAL_COMMUNITY)
Admission: RE | Admit: 2016-07-29 | Discharge: 2016-07-29 | Disposition: A | Discharge status: Home / Self Care | Payer: Medicare Other | Source: Ambulatory Visit | Attending: Interventional Cardiology | Admitting: Interventional Cardiology

## 2016-07-29 DIAGNOSIS — Z955 Presence of coronary angioplasty implant and graft: Secondary | ICD-10-CM

## 2016-07-31 ENCOUNTER — Encounter (HOSPITAL_COMMUNITY)
Admission: RE | Admit: 2016-07-31 | Discharge: 2016-07-31 | Disposition: A | Discharge status: Home / Self Care | Payer: Medicare Other | Source: Ambulatory Visit | Attending: Interventional Cardiology | Admitting: Interventional Cardiology

## 2016-07-31 DIAGNOSIS — Z955 Presence of coronary angioplasty implant and graft: Secondary | ICD-10-CM

## 2016-07-31 NOTE — Progress Notes (Signed)
Discharge Summary  Patient Details  Name: [name] MRN: [id number] Date of Birth: 02/04/1936 Referring Provider:   Flowsheet Row CARDIAC REHAB PHASE II EXERCISE from 04/16/2016 in [facility]  Referring Provider  Daneen Schick iii       Number of Visits: 36  Reason for Discharge:  Patient independent in their exercise.  Smoking History:  History  Smoking Status  . Former Smoker  . Packs/day: 0.12  . Years: 36.00  . Types: Cigarettes  . Quit date: 12/02/1991  Smokeless Tobacco  . Never Used    Diagnosis:  Status post coronary artery stent placement  ADL UCSD:   Initial Exercise Prescription:     Initial Exercise Prescription - 04/16/16 1600      Date of Initial Exercise RX and Referring Provider   Date 04/16/16   Referring Provider Daneen Schick iii     Treadmill   MPH 1.7   Minutes 10   METs 2.3     Recumbant Bike   Level 2   Watts 15   Minutes 10   METs 2.46     NuStep   Level 2   Minutes 10   METs 2     Prescription Details   Frequency (times per week) 3   Duration Progress to 30 minutes of continuous aerobic without signs/symptoms of physical distress     Intensity   THRR 40-80% of Max Heartrate 57-114   Ratings of Perceived Exertion 11-13   Perceived Dyspnea 0-4     Progression   Progression Continue to progress workloads to maintain intensity without signs/symptoms of physical distress.     Resistance Training   Training Prescription Yes   Weight 2lbs   Reps 10-12      Discharge Exercise Prescription (Final Exercise Prescription Changes):     Exercise Prescription Changes - 08/07/16 1300      Exercise Review   Progression Yes     Response to Exercise   Blood Pressure (Admit) 122/82   Blood Pressure (Exercise) 160/78   Blood Pressure (Exit) 120/80   Heart Rate (Admit) 91 bpm   Heart Rate (Exercise) 119 bpm   Heart Rate (Exit) 89 bpm   Rating of Perceived Exertion (Exercise) 12   Symptoms none   Comments Home exercise reviewed on 04/29/16.   Duration Progress to 30 minutes of continuous aerobic without signs/symptoms of physical distress   Intensity THRR unchanged     Progression   Progression Continue to progress workloads to maintain intensity without signs/symptoms of physical distress.   Average METs 3     Resistance Training   Training Prescription Yes   Weight 2lb   Reps 10-12     Interval Training   Interval Training No     Treadmill   MPH 2.3   Grade 2   Minutes 10   METs 3.39     Recumbant Bike   Level 2   Watts 21   Minutes 10   METs 2.83     NuStep   Level 4   Minutes 10   METs 2.9     Home Exercise Plan   Plans to continue exercise at Home   Frequency Add 2 additional days to program exercise sessions.      Functional Capacity:     6 Minute Walk    Row Name 04/16/16 1645 07/01/16 1656       6 Minute Walk   Phase Initial Discharge  Distance 1248 feet 1452 feet    Distance % Change  - 16.3 %    Walk Time 6 minutes 6 minutes    # of Rest Breaks 0 0    MPH 2.36 2.75    METS 2.06 2.54    RPE 11 15    Perceived Dyspnea  1 0    VO2 Peak 7.2 8.9    Symptoms Yes (comment) No    Comments Mild SOB toward end of walk test  -    Resting HR 68 bpm 81 bpm    Resting BP 126/76 124/80    Max Ex. HR 105 bpm 110 bpm    Max Ex. BP 142/72 146/82    2 Minute Post BP 112/72 120/72       Psychological, QOL, Others - Outcomes: PHQ 2/9: Depression screen [place] 2/9 07/31/2016 04/22/2016 03/22/2015 12/21/2014 02/15/2013  Decreased Interest 0 1 0 0 1  Down, Depressed, Hopeless 0 0 0 0 1  PHQ - 2 Score 0 1 0 0 2  Altered sleeping - - - - 1  Tired, decreased energy - - - - 1  Change in appetite - - - - 0  Feeling bad or failure about yourself  - - - - 1  Trouble concentrating - - - - 1  Moving slowly or fidgety/restless - - - - 1  Suicidal thoughts - - - - 0  PHQ-9 Score - - - - 7    Quality of Life:     Quality of Life -  08/22/16 1332      Quality of Life Scores   Health/Function Pre 18.8 %   Socioeconomic Pre 27 %   Psych/Spiritual Pre 25.71 %   Family Pre 20.4 %   GLOBAL Pre 22 %      Personal Goals: Goals established at orientation with interventions provided to work toward goal.     Personal Goals and Risk Factors at Admission - 04/16/16 1649      Core Components/Risk Factors/Patient Goals on Admission    Weight Management Weight Loss;Obesity;Yes   Intervention Weight Management: Develop a combined nutrition and exercise program designed to reach desired caloric intake, while maintaining appropriate intake of nutrient and fiber, sodium and fats, and appropriate energy expenditure required for the weight goal.;Weight Management/Obesity: Establish reasonable short term and long term weight goals.;Obesity: Provide education and appropriate resources to help participant work on and attain dietary goals.   Admit Weight 224 lb 3.3 oz (101.7 kg)   Goal Weight: Short Term 220 lb (99.8 kg)   Goal Weight: Long Term 218 lb (98.9 kg)   Expected Outcomes Weight Loss: Understanding of general recommendations for a balanced deficit meal plan, which promotes 1-2 lb weight loss per week and includes a negative energy balance of [phone] kcal/d;Short Term: Continue to assess and modify interventions until short term weight is achieved;Long Term: Adherence to nutrition and physical activity/exercise program aimed toward attainment of established weight goal   Sedentary Yes   Intervention Provide advice, education, support and counseling about physical activity/exercise needs.;Develop an individualized exercise prescription for aerobic and resistive training based on initial evaluation findings, risk stratification, comorbidities and participant's personal goals.   Expected Outcomes Achievement of increased cardiorespiratory fitness and enhanced flexibility, muscular endurance and strength shown through measurements of  functional capacity and personal statement of participant.   Hypertension Yes   Intervention Provide education on lifestyle modifcations including regular physical activity/exercise, weight management, moderate sodium restriction and increased consumption  of fresh fruit, vegetables, and low fat dairy, alcohol moderation, and smoking cessation.;Monitor prescription use compliance.   Expected Outcomes Short Term: Continued assessment and intervention until BP is < 140/50m HG in hypertensive participants. < 130/873mHG in hypertensive participants with diabetes, heart failure or chronic kidney disease.;Long Term: Maintenance of blood pressure at goal levels.   Lipids Yes   Intervention Provide education and support for participant on nutrition & aerobic/resistive exercise along with prescribed medications to achieve LDL <7049mHDL >73m57m Expected Outcomes Short Term: Participant states understanding of desired cholesterol values and is compliant with medications prescribed. Participant is following exercise prescription and nutrition guidelines.;Long Term: Cholesterol controlled with medications as prescribed, with individualized exercise RX and with personalized nutrition plan. Value goals: LDL < 70mg64mL > 40 mg.   Personal Goal Other Yes   Personal Goal Get fitness back to be able to do ADLs.   Intervention Provide individulaized aerobic and resistance training program, including stretching, to improve functional fitness, so that patient can do ADLs.   Expected Outcomes Improve functional fitness through regular aerobic exercise 5-7 days per week.       Personal Goals Discharge:     Goals and Risk Factor Review    Row Name 05/15/16 0829 05/31/16 1000 06/19/16 1724 08/07/16 1353 08/22/16 1224     Core Components/Risk Factors/Patient Goals Review   Personal Goals Review Other Other;Weight Management/Obesity Weight Management/Obesity;Other Other Weight Management/Obesity   Review Home exercise  reviewed with patient on 04/29/16. Patient feels strength and stamina are improving. Her goal is to walk 5,000 steps /day. Participant has lost 5.7 lbs. Doing well with home exercise routine. Pt has maintanined her previous weight loss, states her energy level has increased and she is able to do more w/o SOB.  Doing well w/ exercise program Pt states she is able to do more w/o SOB.  [facility]-45 minutes/day Pt wt is down 2.3 kg (5.1 lb), which is desired. Wt loss short term goal wt achieved.    Expected Outcomes Achieve cardiorespiratory improvements through regular aerobic exercise routine. Continue weight loss and improve fitness through regular exercise program. Continue weight loss and improve fitness through regular exercise program. Have less fatigue and SOB during and after exertion and contine to lose or maintain weight Continue to encourage diet and exercise habits to promote heart health.       Nutrition & Weight - Outcomes:     Pre Biometrics - 04/16/16 1643      Pre Biometrics   Height _0  (1.676 m)   Weight 224 lb 3.3 oz (101.7 kg)   Waist Circumference 46 inches   Hip Circumference 46 inches   Waist to Hip Ratio 1 %   BMI (Calculated) 36.3   Triceps Skinfold 38 mm   % Body Fat 38.1 %   Grip Strength 27.5 kg   Flexibility 14.5 in   Single Leg Stand 2.75 seconds         Post Biometrics - 08/22/16 1612       Post  Biometrics   Weight 219 lb 2.2 oz (99.4 kg)   Waist Circumference 46.5 inches   Hip Circumference 46 inches   Waist to Hip Ratio 1.01 %   Triceps Skinfold 34 mm   % Body Fat 48.5 %   Grip Strength 30 kg   Flexibility 0 in   Single Leg Stand 3.21 seconds  Nutrition:     Nutrition Therapy & Goals - 04/17/16 0944      Nutrition Therapy   Diet Therapeutic Lifestyle Changes     Personal Nutrition Goals   Personal Goal #1 1-2 lb wt loss/week to a wt loss goal of 6-20 lb at graduation from  [place], educate and counsel regarding individualized specific dietary modifications aiming towards targeted core components such as weight, hypertension, lipid management, diabetes, heart failure and other comorbidities.   Expected Outcomes Short Term Goal: Understand basic principles of dietary content, such as calories, fat, sodium, cholesterol and nutrients.;Long Term Goal: Adherence to prescribed nutrition plan.      Nutrition Discharge:     Nutrition Assessments - 04/17/16 0944      MEDFICTS Scores   Pre Score 18      Education Questionnaire Score:     Knowledge Questionnaire Score - 04/16/16 1644      Knowledge Questionnaire Score   Pre Score 21/24      Goals reviewed with patient; copy given to patient. [name] graduates on Friday from cardiac rehab program today with completion of 36 exercise sessions in Phase II. Pt maintained good attendance and progressed nicely during his participation in rehab as evidenced by increased MET level.   Medication list reconciled. Repeat  PHQ score- 0 .  Pt has made significant lifestyle changes and should be commended for her success. Pt feels she has achieved her goals during cardiac rehab.   [name] plans to continue exercise at the [employer].Harrell Gave RN BSN

## 2016-08-02 ENCOUNTER — Encounter (HOSPITAL_COMMUNITY)
Admission: RE | Admit: 2016-08-02 | Discharge: 2016-08-02 | Disposition: A | Discharge status: Home / Self Care | Payer: Medicare Other | Source: Ambulatory Visit | Attending: Interventional Cardiology | Admitting: Interventional Cardiology

## 2016-08-02 VITALS — Wt 219.1 lb

## 2016-08-02 DIAGNOSIS — Z955 Presence of coronary angioplasty implant and graft: Secondary | ICD-10-CM | POA: Insufficient documentation

## 2016-08-05 ENCOUNTER — Encounter (HOSPITAL_COMMUNITY): Payer: Medicare Other

## 2016-08-07 ENCOUNTER — Encounter (HOSPITAL_COMMUNITY): Payer: Medicare Other

## 2016-08-09 ENCOUNTER — Encounter (HOSPITAL_COMMUNITY): Payer: Medicare Other

## 2016-08-12 ENCOUNTER — Encounter (HOSPITAL_COMMUNITY): Payer: Medicare Other

## 2016-08-14 ENCOUNTER — Encounter (HOSPITAL_COMMUNITY): Payer: Medicare Other

## 2016-08-16 ENCOUNTER — Encounter (HOSPITAL_COMMUNITY): Payer: Medicare Other

## 2016-08-19 ENCOUNTER — Encounter (HOSPITAL_COMMUNITY): Payer: Medicare Other

## 2016-08-19 NOTE — Progress Notes (Signed)
Cardiology Office Note    Date:  08/20/2016   ID:  Shakhia Gramajo, DOB 05/13/1936, MRN [id number]  PCP:  [name]., MD  Cardiologist: Lesleigh Noe, MD   Chief Complaint  Patient presents with  . Coronary Artery Disease    History of Present Illness:  [name] is a 80 y.o. female  with a h/o CAD (s/p DES to LAD 2006, DES to Cx 2014, angiosculpt scoring balloon/DES to LAD 2016, DES to RCA 12/2015), chronic diastolic CHF, HTN, atrial fib/flutter, hyperlipidemia, pre-diabetes, chronic LEE (L>R), fibromyalgia, anxiety, remote h/o stomach ulcers.  She is doing okay. Her energy level is improved. She has not had syncope. No nitroglycerin use.  Discussed the Orion 10 trial for elevated lipids.   Past Medical History:  Diagnosis Date  . Anxiety   . Arthritis    Multiple joints  . Chronic diastolic CHF (congestive heart failure) (HCC) 01/16/2016  . Coronary artery disease    a. s/p DES to LAD 2006. b. DES to Cx 2014. c. angiosculpt scoring balloon/DES to LAD 2016. d. DES to RCA 12/2015.  . Fibromyalgia   . GERD (gastroesophageal reflux disease)   . High cholesterol   . History of stomach ulcers    Remotely  . Hypertension   . Lower extremity edema    a. h/o chronic edema L>R.  . Osteoporosis   . PAF (paroxysmal atrial fibrillation) (HCC)    a. 2012 - H/P says atrial flutter, D/c summary says atrial fib - was tx with IV amiodarone, only had a few hours of arrhythmias, no clinical recurrence as of 2017.  [name] Ventricular hypertrophy   . Vertigo 08/27/2006    Past Surgical History:  Procedure Laterality Date  . ABDOMINAL HYSTERECTOMY  1970's  . BREAST BIOPSY Right 1970's  . BREAST LUMPECTOMY Right 1970's   benign   . CARDIAC CATHETERIZATION  10/2006   [name] 08/03/2007 (01/27/2013)  . CARDIAC CATHETERIZATION N/A 01/01/2016   Procedure: Left Heart Cath and Coronary Angiography;  Surgeon: Kathleene Hazel, MD;  Location[facility] INVASIVE CV LAB;   Service: Cardiovascular;  Laterality: N/A;  . CARDIAC CATHETERIZATION N/A 01/01/2016   Procedure: Coronary Stent Intervention;  Surgeon: Kathleene Hazel, MD;  Location: [facility] INVASIVE CV LAB;  Service: Cardiovascular;  Laterality: N/A;  . CORONARY ANGIOPLASTY WITH STENT PLACEMENT  06/2005; 01/27/2013    2.515 mm Cypher stent to LAD in 2006; 2.75 x 12 Promus DES to the circumflex 2014    . DIAGNOSTIC LAPAROSCOPY  2000   lap abdominal lysis of adhesions  . ESOPHAGOGASTRODUODENOSCOPY N/A 11/10/2014   Procedure: ESOPHAGOGASTRODUODENOSCOPY (EGD);  Surgeon: Jeani Hawking, MD;  Location: [facility] ENDOSCOPY;  Service: Endoscopy;  Laterality: N/A;  . HERNIA REPAIR    . LAPAROSCOPIC SALPINGO OOPHERECTOMY  2000  . LAPAROTOMY     "day after oophorectomy; had bowel obstruction" (01/27/2013)  . LEFT HEART CATHETERIZATION WITH CORONARY ANGIOGRAM N/A 01/27/2013   Procedure: LEFT HEART CATHETERIZATION WITH CORONARY ANGIOGRAM;  Surgeon: Corky Crafts, MD;  Location: [facility] CATH LAB;  Service: Cardiovascular;  Laterality: N/A;  . LEFT HEART CATHETERIZATION WITH CORONARY ANGIOGRAM N/A 11/10/2014   Procedure: LEFT HEART CATHETERIZATION WITH CORONARY ANGIOGRAM;  Surgeon: Lennette Bihari, MD; left main okay, LAD 75% ISR, FFR 0.75, s/p Angiosculpt scoring balloon and 2.7518 mm Resolute DES, CFX 70%, stent patent, RCA 50%  . TONSILLECTOMY AND ADENOIDECTOMY  ~ 1952    Current Medications: Outpatient Medications Prior to Visit  Medication Sig Dispense Refill  .  acetaminophen (TYLENOL) 500 MG tablet Take 500 mg by mouth daily as needed (pain).    [name] albuterol (PROVENTIL HFA;VENTOLIN HFA) 108 (90 Base) MCG/ACT inhaler Inhale 2 puffs into the lungs as directed.    [name] ALPRAZolam (XANAX) 0.25 MG tablet Take 0.25 mg by mouth 2 (two) times daily as needed for anxiety. anxiety    . aspirin EC 81 MG tablet Take 81 mg by mouth daily.      . clopidogrel (PLAVIX) 75 MG tablet Take 1 tablet (75 mg total) by mouth daily. 30 tablet 11  .  Diphenhydramine-APAP, sleep, (TYLENOL PM EXTRA STRENGTH PO) Take 1 tablet by mouth at bedtime as needed. For sleep    . fluticasone (FLONASE) 50 MCG/ACT nasal spray Place 2 sprays into both nostrils daily. 1 g 2  . HYDROcodone-acetaminophen (NORCO/VICODIN) 5-325 MG per tablet Take 0.5-1 tablets by mouth every 6 (six) hours as needed for moderate pain. 20 tablet 0  . lidocaine (LIDODERM) 5 % Place 1 patch onto the skin daily as needed (pain).     [name] loratadine (CLARITIN) 10 MG tablet Take 10 mg by mouth daily. Reported on 12/29/2015    . LYRICA 75 MG capsule Take 75 mg by mouth at bedtime.     . metoprolol tartrate (LOPRESSOR) 25 MG tablet Take 1 tablet (25 mg total) by mouth 2 (two) times daily. 60 tablet 6  . nitroGLYCERIN (NITROSTAT) 0.4 MG SL tablet Place 1 tablet (0.4 mg total) under the tongue every 5 (five) minutes as needed for chest pain (CP or SOB). 25 tablet 2  . pantoprazole (PROTONIX) 40 MG tablet Take 1 tablet (40 mg total) by mouth daily at 6 (six) AM. 30 tablet 6  . potassium chloride SA (K-DUR,KLOR-CON) 20 MEQ tablet Take 1 tablet (20 mEq total) by mouth daily. 90 tablet 3  . Probiotic Product (ALIGN PO) Take 1 tablet by mouth daily.    [name] torsemide (DEMADEX) 20 MG tablet Take 20 mg by mouth daily. Take 1 to 2 tablets by mouth daily as needed for swelling.    . valsartan-hydrochlorothiazide (DIOVAN-HCT) 160-12.5 MG per tablet Take 1 tablet by mouth daily.     . Vitamin D, Ergocalciferol, (DRISDOL) 50000 UNITS CAPS capsule Take 50,000 Units by mouth every 7 (seven) days. Take on Mondays    . zolpidem (AMBIEN) 10 MG tablet Take 5 mg by mouth daily as needed for sleep. Reported on 12/29/2015    . cetirizine (ZYRTEC) 10 MG tablet Take 10 mg by mouth daily.    [name] conjugated estrogens (PREMARIN) vaginal cream Place 1 Applicatorful vaginally daily as needed (dryness). Reported on 12/29/2015    . rosuvastatin (CRESTOR) 10 MG tablet Take 10 mg by mouth 4 (four) times a week.      No  facility-administered medications prior to visit.      Allergies:   Adhesive [tape]; Statins; Codeine; and Elavil [amitriptyline]   Social History   Social History  . Marital status: Married    Spouse name: N/A  . Number of children: 3  . Years of education: masters   Occupational History  . retired    Social History Main Topics  . Smoking status: Former Smoker    Packs/day: 0.12    Years: 36.00    Types: Cigarettes    Quit date: 12/02/1991  . Smokeless tobacco: Never Used  . Alcohol use No  . Drug use: No  . Sexual activity: Not Currently    Birth control/ protection: Surgical  Other Topics Concern  . None   Social History Narrative  . None     Family History:  The patient's family history includes Heart attack in her father; Stroke in her mother.   ROS:   Please see the history of present illness.    No complaints other than anxiety and worry concerning her husband's health  All other systems reviewed and are negative.   PHYSICAL EXAM:   VS:  BP 90/64 (BP Location: Left Arm)   Pulse 70   Ht 5' 6.5" (1.689 m)   Wt 217 lb (98.4 kg)   BMI 34.50 kg/m    GEN: Well nourished, well developed, in no acute distress  HEENT: normal  Neck: no JVD, carotid bruits, or masses Cardiac: RRR; no murmurs, rubs, or gallops,no edema  Respiratory:  clear to auscultation bilaterally, normal work of breathing GI: soft, nontender, nondistended, + BS MS: no deformity or atrophy  Skin: warm and dry, no rash Neuro:  Alert and Oriented x 3, Strength and sensation are intact Psych: euthymic mood, full affect  Wt Readings from Last 3 Encounters:  08/20/16 217 lb (98.4 kg)  07/19/16 217 lb 14.2 oz (98.8 kg)  07/12/16 216 lb (98 kg)      Studies/Labs Reviewed:   EKG:  EKG  Not performed  Recent Labs: 12/29/2015: Magnesium 2.2 01/02/2016: Hemoglobin 12.7; Platelets 232 01/16/2016: Brain Natriuretic Peptide 36.6 01/23/2016: BUN 19; Creat 1.02; Potassium 4.4; Sodium  139 04/30/2016: TSH 2.70   Lipid Panel    Component Value Date/Time   CHOL 271 (H) 12/30/2015 0000   TRIG 90 12/30/2015 0000   HDL 49 12/30/2015 0000   CHOLHDL 5.5 12/30/2015 0000   VLDL 18 12/30/2015 0000   LDLCALC 204 (H) 12/30/2015 0000    Additional studies/ records that were reviewed today include:  No above data lipids of very poorly controlled. She is on less than optimal statin therapy.  Cardiac catheterization 01/01/16: Diagnostic Diagram     Post-Intervention Diagram     Implants       ASSESSMENT:    1. Coronary artery disease involving native coronary artery of native heart without angina pectoris   2. Chronic diastolic CHF (congestive heart failure) (HCC)   3. Essential hypertension   4. Hyperlipidemia, unspecified hyperlipidemia type   5. Paroxysmal atrial fibrillation (HCC)      PLAN:  In order of problems listed above:  1. Stable without recurrent angina. 2. No evidence of volume overload. 3. Blood pressures well controlled. My recording is 130/72 mmHg. 4. Lipids are poorly controlled. I have referred her for consideration to enroll [facility] the [facility]. 5. No recurrences of atrial fibrillation    Medication Adjustments/Labs and Tests Ordered: Current medicines are reviewed at length with the patient today.  Concerns regarding medicines are outlined above.  Medication changes, Labs and Tests ordered today are listed in the Patient Instructions below. Patient Instructions  Medication Instructions:  None  Labwork: None  Testing/Procedures: None  Follow-Up: Your physician wants you to follow-up in: 6 months with Dr. Katrinka BlazingSmith.  You will receive a reminder letter in the mail two months in advance. If you don't receive a letter, please call our office to schedule the follow-up appointment.   Any Other Special Instructions Will Be Listed Below (If Applicable).     If you need a refill on your cardiac medications before your next appointment,  please call your pharmacy.      Signed, Lesleigh NoeHenry W Smith III, MD  08/20/2016 3:10 PM    [facility] [address], [place], [place]  [address] Phone: [phone]; Fax: [phone]

## 2016-08-20 ENCOUNTER — Ambulatory Visit (INDEPENDENT_AMBULATORY_CARE_PROVIDER_SITE_OTHER): Payer: Medicare Other | Admitting: Interventional Cardiology

## 2016-08-20 ENCOUNTER — Encounter: Payer: Self-pay | Admitting: Interventional Cardiology

## 2016-08-20 VITALS — BP 90/64 | HR 70 | Ht 66.5 in | Wt 217.0 lb

## 2016-08-20 DIAGNOSIS — E785 Hyperlipidemia, unspecified: Secondary | ICD-10-CM

## 2016-08-20 DIAGNOSIS — I5032 Chronic diastolic (congestive) heart failure: Secondary | ICD-10-CM | POA: Diagnosis not present

## 2016-08-20 DIAGNOSIS — I10 Essential (primary) hypertension: Secondary | ICD-10-CM

## 2016-08-20 DIAGNOSIS — I2510 Atherosclerotic heart disease of native coronary artery without angina pectoris: Secondary | ICD-10-CM

## 2016-08-20 DIAGNOSIS — I480 Paroxysmal atrial fibrillation: Secondary | ICD-10-CM

## 2016-08-20 NOTE — Patient Instructions (Signed)
Medication Instructions:  None  Labwork: None  Testing/Procedures: None  Follow-Up: Your physician wants you to follow-up in: 6 months with Dr. Smith.  You will receive a reminder letter in the mail two months in advance. If you don't receive a letter, please call our office to schedule the follow-up appointment.   Any Other Special Instructions Will Be Listed Below (If Applicable).     If you need a refill on your cardiac medications before your next appointment, please call your pharmacy.   

## 2016-08-21 ENCOUNTER — Encounter (HOSPITAL_COMMUNITY): Payer: Medicare Other

## 2016-08-22 NOTE — Addendum Note (Signed)
Encounter addended by: Ples SpecterAshley L Matalie Romberger on: 08/22/2016  1:51 PM<BR>    Actions taken: Flowsheet data copied forward, Visit Navigator Flowsheet section accepted

## 2016-08-22 NOTE — Addendum Note (Signed)
Encounter addended by: Jacques EarthlyEdna Brewbaker Mohamud Mrozek, RD on: 08/22/2016 12:26 PM<BR>    Actions taken: Visit Navigator Flowsheet section accepted

## 2016-08-22 NOTE — Addendum Note (Signed)
Encounter addended by: Ples SpecterAshley L Carmello Cabiness on: 08/22/2016  4:16 PM<BR>    Actions taken: Flowsheet data copied forward, Visit Navigator Flowsheet section accepted

## 2016-08-23 ENCOUNTER — Encounter (HOSPITAL_COMMUNITY): Payer: Medicare Other

## 2016-09-07 ENCOUNTER — Ambulatory Visit (HOSPITAL_COMMUNITY)
Admission: EM | Admit: 2016-09-07 | Discharge: 2016-09-07 | Disposition: A | Discharge status: Home / Self Care | Payer: Medicare PPO | Attending: Family Medicine | Admitting: Family Medicine

## 2016-09-07 ENCOUNTER — Encounter (HOSPITAL_COMMUNITY): Payer: Self-pay | Admitting: Emergency Medicine

## 2016-09-07 DIAGNOSIS — J40 Bronchitis, not specified as acute or chronic: Secondary | ICD-10-CM | POA: Diagnosis not present

## 2016-09-07 DIAGNOSIS — R05 Cough: Secondary | ICD-10-CM | POA: Diagnosis not present

## 2016-09-07 DIAGNOSIS — R059 Cough, unspecified: Secondary | ICD-10-CM

## 2016-09-07 MED ORDER — AZITHROMYCIN 250 MG PO TABS: 250.0000 mg | ORAL_TABLET | Freq: Every day | ORAL | 0 refills | Status: DC

## 2016-09-07 MED ORDER — BENZONATATE 100 MG PO CAPS: 100.0000 mg | ORAL_CAPSULE | Freq: Three times a day (TID) | ORAL | 0 refills | Status: DC

## 2016-09-07 NOTE — ED Triage Notes (Signed)
Patient also reports phlegm is yellow.  Reports temp of 99.8

## 2016-09-08 NOTE — ED Provider Notes (Signed)
CSN: [id number]     Arrival date & time 09/07/16  1426 History   First MD Initiated Contact with Patient 09/07/16 1548     Chief Complaint  Patient presents with  . URI   (Consider location/radiation/quality/duration/timing/severity/associated sxs/prior Treatment) Patient c/o uri sx's and cough.  Patient c/o low grade fever.   The history is provided by the patient.  URI  Presenting symptoms: congestion and cough   Severity:  Moderate Onset quality:  Sudden Duration:  1 week Timing:  Constant Progression:  Worsening Associated symptoms: arthralgias, sinus pain and sneezing     Past Medical History:  Diagnosis Date  . Anxiety   . Arthritis    Multiple joints  . Chronic diastolic CHF (congestive heart failure) (HCC) 01/16/2016  . Coronary artery disease    a. s/p DES to LAD 2006. b. DES to Cx 2014. c. angiosculpt scoring balloon/DES to LAD 2016. d. DES to RCA 12/2015.  . Fibromyalgia   . GERD (gastroesophageal reflux disease)   . High cholesterol   . History of stomach ulcers    Remotely  . Hypertension   . Lower extremity edema    a. h/o chronic edema L>R.  . Osteoporosis   . PAF (paroxysmal atrial fibrillation) (HCC)    a. 2012 - H/P says atrial flutter, D/c summary says atrial fib - was tx with IV amiodarone, only had a few hours of arrhythmias, no clinical recurrence as of 2017.  [name] Ventricular hypertrophy   . Vertigo 08/27/2006   Past Surgical History:  Procedure Laterality Date  . ABDOMINAL HYSTERECTOMY  1970's  . BREAST BIOPSY Right 1970's  . BREAST LUMPECTOMY Right 1970's   benign   . CARDIAC CATHETERIZATION  10/2006   [name] 08/03/2007 (01/27/2013)  . CARDIAC CATHETERIZATION N/A 01/01/2016   Procedure: Left Heart Cath and Coronary Angiography;  Surgeon: Kathleene Hazel, MD;  Location: [facility] INVASIVE CV LAB;  Service: Cardiovascular;  Laterality: N/A;  . CARDIAC CATHETERIZATION N/A 01/01/2016   Procedure: Coronary Stent Intervention;  Surgeon: Kathleene Hazel, MD;  Location: [facility] INVASIVE CV LAB;  Service: Cardiovascular;  Laterality: N/A;  . CORONARY ANGIOPLASTY WITH STENT PLACEMENT  06/2005; 01/27/2013    2.515 mm Cypher stent to LAD in 2006; 2.75 x 12 Promus DES to the circumflex 2014    . DIAGNOSTIC LAPAROSCOPY  2000   lap abdominal lysis of adhesions  . ESOPHAGOGASTRODUODENOSCOPY N/A 11/10/2014   Procedure: ESOPHAGOGASTRODUODENOSCOPY (EGD);  Surgeon: Jeani Hawking, MD;  Location: [facility] ENDOSCOPY;  Service: Endoscopy;  Laterality: N/A;  . HERNIA REPAIR    . LAPAROSCOPIC SALPINGO OOPHERECTOMY  2000  . LAPAROTOMY     "day after oophorectomy; had bowel obstruction" (01/27/2013)  . LEFT HEART CATHETERIZATION WITH CORONARY ANGIOGRAM N/A 01/27/2013   Procedure: LEFT HEART CATHETERIZATION WITH CORONARY ANGIOGRAM;  Surgeon: Corky Crafts, MD;  Location: [facility] CATH LAB;  Service: Cardiovascular;  Laterality: N/A;  . LEFT HEART CATHETERIZATION WITH CORONARY ANGIOGRAM N/A 11/10/2014   Procedure: LEFT HEART CATHETERIZATION WITH CORONARY ANGIOGRAM;  Surgeon: Lennette Bihari, MD; left main okay, LAD 75% ISR, FFR 0.75, s/p Angiosculpt scoring balloon and 2.7518 mm Resolute DES, CFX 70%, stent patent, RCA 50%  . TONSILLECTOMY AND ADENOIDECTOMY  ~ 1952   Family History  Problem Relation Age of Onset  . Stroke Mother   . Heart attack Father    Social History  Substance Use Topics  . Smoking status: Former Smoker    Packs/day: 0.12    Years: 36.00  Types: Cigarettes    Quit date: 12/02/1991  . Smokeless tobacco: Never Used  . Alcohol use No   OB History    No data available     Review of Systems  HENT: Positive for congestion, sinus pain and sneezing.   Eyes: Negative.   Respiratory: Positive for cough.   Genitourinary: Negative.   Musculoskeletal: Positive for arthralgias.  Allergic/Immunologic: Negative.   Hematological: Negative.     Allergies  Adhesive [tape]; Statins; Codeine; and Elavil [amitriptyline]  Home Medications   Prior  to Admission medications   Medication Sig Start Date End Date Taking? Authorizing Provider  acetaminophen (TYLENOL) 500 MG tablet Take 500 mg by mouth daily as needed (pain).    Historical Provider, MD  albuterol (PROVENTIL HFA;VENTOLIN HFA) 108 (90 Base) MCG/ACT inhaler Inhale 2 puffs into the lungs as directed.    Historical Provider, MD  ALPRAZolam Prudy Feeler(XANAX) 0.25 MG tablet Take 0.25 mg by mouth 2 (two) times daily as needed for anxiety. anxiety    Historical Provider, MD  aspirin EC 81 MG tablet Take 81 mg by mouth daily.      Historical Provider, MD  azithromycin (ZITHROMAX) 250 MG tablet Take 1 tablet (250 mg total) by mouth daily. Take first 2 tablets together, then 1 every day until finished. 09/07/16   Deatra CanterWilliam J Oxford, FNP  benzonatate (TESSALON) 100 MG capsule Take 1 capsule (100 mg total) by mouth every 8 (eight) hours. 09/07/16   Deatra CanterWilliam J Oxford, FNP  clopidogrel (PLAVIX) 75 MG tablet Take 1 tablet (75 mg total) by mouth daily. 01/02/16   Brittainy M Simmons, PA-C  Diphenhydramine-APAP, sleep, (TYLENOL PM EXTRA STRENGTH PO) Take 1 tablet by mouth at bedtime as needed. For sleep    Historical Provider, MD  fluticasone (FLONASE) 50 MCG/ACT nasal spray Place 2 sprays into both nostrils daily. 05/06/14   Esperanza SheetsUlugbek N Buriev, MD  HYDROcodone-acetaminophen (NORCO/VICODIN) 5-325 MG per tablet Take 0.5-1 tablets by mouth every 6 (six) hours as needed for moderate pain. 11/12/14   Osvaldo ShipperGokul Krishnan, MD  lidocaine (LIDODERM) 5 % Place 1 patch onto the skin daily as needed (pain).  09/27/14   Historical Provider, MD  loratadine (CLARITIN) 10 MG tablet Take 10 mg by mouth daily. Reported on 12/29/2015    Historical Provider, MD  LYRICA 75 MG capsule Take 75 mg by mouth at bedtime.  10/10/14   Historical Provider, MD  metoprolol tartrate (LOPRESSOR) 25 MG tablet Take 1 tablet (25 mg total) by mouth 2 (two) times daily. 07/04/16 10/02/16  Brittainy M Simmons, PA-C  nitroGLYCERIN (NITROSTAT) 0.4 MG SL tablet Place 1 tablet  (0.4 mg total) under the tongue every 5 (five) minutes as needed for chest pain (CP or SOB). 01/02/16   Brittainy Sherlynn CarbonM Simmons, PA-C  pantoprazole (PROTONIX) 40 MG tablet Take 1 tablet (40 mg total) by mouth daily at 6 (six) AM. 04/24/16   Brittainy Sherlynn CarbonM Simmons, PA-C  potassium chloride SA (K-DUR,KLOR-CON) 20 MEQ tablet Take 1 tablet (20 mEq total) by mouth daily. 01/16/16   Beatrice LecherScott T Weaver, PA-C  Probiotic Product (ALIGN PO) Take 1 tablet by mouth daily.    Historical Provider, MD  rosuvastatin (CRESTOR) 10 MG tablet Take 10 mg by mouth 2 (two) times a week.    Historical Provider, MD  torsemide (DEMADEX) 20 MG tablet Take 20 mg by mouth daily. Take 1 to 2 tablets by mouth daily as needed for swelling.    Historical Provider, MD  valsartan-hydrochlorothiazide (DIOVAN-HCT) 160-12.5 MG per tablet  Take 1 tablet by mouth daily.  07/05/13   Historical Provider, MD  Vitamin D, Ergocalciferol, (DRISDOL) 50000 UNITS CAPS capsule Take 50,000 Units by mouth every 7 (seven) days. Take on Mondays    Historical Provider, MD  zolpidem (AMBIEN) 10 MG tablet Take 5 mg by mouth daily as needed for sleep. Reported on 12/29/2015    Historical Provider, MD   Meds Ordered and Administered this Visit  Medications - No data to display  BP 123/67 (BP Location: Left Arm) Comment (BP Location): large cuff  Pulse 78   Temp 99.2 F (37.3 C) (Oral)   Resp 22   SpO2 100%  No data found.   Physical Exam  Constitutional: She appears well-developed and well-nourished.  HENT:  Head: Normocephalic and atraumatic.  Right Ear: External ear normal.  Left Ear: External ear normal.  Mouth/Throat: Oropharynx is clear and moist.  Eyes: Conjunctivae and EOM are normal. Pupils are equal, round, and reactive to light.  Neck: Normal range of motion. Neck supple.  Cardiovascular: Normal rate, regular rhythm and normal heart sounds.   Pulmonary/Chest: Effort normal and breath sounds normal.  Abdominal: Soft. Bowel sounds are normal.   Nursing note and vitals reviewed.   Urgent Care Course   Clinical Course     Procedures (including critical care time)  Labs Review Labs Reviewed - No data to display  Imaging Review No results found.   Visual Acuity Review  Right Eye Distance:   Left Eye Distance:   Bilateral Distance:    Right Eye Near:   Left Eye Near:    Bilateral Near:         MDM   1. Bronchitis   2. Cough    Zpak Tessalon Perles  Push po fluids, rest, tylenol and motrin otc prn as directed for fever, arthralgias, and myalgias.  Follow up prn if sx's continue or persist.    Deatra Canter, FNP 09/08/16 1410

## 2016-10-04 ENCOUNTER — Other Ambulatory Visit: Payer: Self-pay | Admitting: Interventional Cardiology

## 2016-10-04 ENCOUNTER — Encounter: Payer: Self-pay | Admitting: Cardiology

## 2016-10-04 ENCOUNTER — Encounter: Payer: Self-pay | Admitting: *Deleted

## 2016-10-04 DIAGNOSIS — Z006 Encounter for examination for normal comparison and control in clinical research program: Secondary | ICD-10-CM

## 2016-10-04 NOTE — Progress Notes (Unsigned)
Orion-10 Study All elements of the informed consent form,study requirements and expectations were reviewed with the patient, and all questions and concerns were identified and addressed prior to signing of the consent. No procedures were performed prior to consenting the patient. The patient was given an adequate amount of time to make an informed decision. A copy of the informed consent was provided to the patient to take home. 10/04/2016   10:00am

## 2016-10-17 ENCOUNTER — Other Ambulatory Visit (HOSPITAL_COMMUNITY): Payer: Self-pay | Admitting: Nurse Practitioner

## 2016-10-17 ENCOUNTER — Encounter: Payer: Self-pay | Admitting: Cardiology

## 2016-10-17 ENCOUNTER — Encounter: Payer: Self-pay | Admitting: *Deleted

## 2016-10-17 DIAGNOSIS — Z006 Encounter for examination for normal comparison and control in clinical research program: Secondary | ICD-10-CM

## 2016-10-17 MED ORDER — AMBULATORY NON FORMULARY MEDICATION: 300.0000 mg | Status: DC

## 2016-10-17 NOTE — Progress Notes (Signed)
[name] was in for her Randomization visit for the ORION-10 trial. First injection given at 09:05. Patient tolerated well.

## 2016-10-18 ENCOUNTER — Other Ambulatory Visit: Payer: Self-pay | Admitting: *Deleted

## 2016-10-18 MED ORDER — AMBULATORY NON FORMULARY MEDICATION: 300.0000 mg | Status: DC

## 2016-10-23 ENCOUNTER — Telehealth: Payer: Self-pay | Admitting: *Deleted

## 2016-10-23 NOTE — Telephone Encounter (Signed)
Patient called to let me know she had muscle aches on Friday Feb.16-18. She stated the muscle aches were in her arms and legs and were of moderate intensity. She stated they were better now but patient had taken her Crestor 5mg  on Thursday evening. Patient had injection on Thursday Feb. 18. of Inclirisan sodium vs placebo. I spoke to Dr. Riley KillStuckey about muscle aches and we will watch for now. I also let patient know Hemoglobin A1c is 6.4 and I will send to her primary physician Dr. Andi DevonKimberly Shelton.

## 2016-11-07 ENCOUNTER — Telehealth: Payer: Self-pay | Admitting: Rheumatology

## 2016-11-07 NOTE — Telephone Encounter (Signed)
Patient is experiencing pain in her ankle and leg that started yesterday and has gotten worse. She says that is the knee that she has had injections in, in the past. She is requesting a call back as to what to do for the pain or if she needs to come in and be seen? Please call patient back.

## 2016-11-08 ENCOUNTER — Ambulatory Visit (INDEPENDENT_AMBULATORY_CARE_PROVIDER_SITE_OTHER): Payer: Medicare PPO | Admitting: Rheumatology

## 2016-11-08 VITALS — BP 120/75 | HR 73 | Resp 12

## 2016-11-08 DIAGNOSIS — R600 Localized edema: Secondary | ICD-10-CM | POA: Diagnosis not present

## 2016-11-08 DIAGNOSIS — M7122 Synovial cyst of popliteal space [Baker], left knee: Secondary | ICD-10-CM

## 2016-11-08 DIAGNOSIS — M79605 Pain in left leg: Secondary | ICD-10-CM | POA: Diagnosis not present

## 2016-11-08 DIAGNOSIS — M797 Fibromyalgia: Secondary | ICD-10-CM | POA: Diagnosis not present

## 2016-11-08 DIAGNOSIS — M25562 Pain in left knee: Secondary | ICD-10-CM

## 2016-11-08 MED ORDER — TRIAMCINOLONE ACETONIDE 40 MG/ML IJ SUSP
40.0000 mg | INTRAMUSCULAR | Status: AC | PRN
  Administered 2016-11-08: 40 mg via INTRA_ARTICULAR

## 2016-11-08 MED ORDER — LIDOCAINE HCL 1 % IJ SOLN
1.5000 mL | INTRAMUSCULAR | Status: AC | PRN
  Administered 2016-11-08: 1.5 mL

## 2016-11-08 NOTE — Telephone Encounter (Signed)
Patient seen an injection given and patient started feeling better  She has follow-up in 2 weeks from today (approximately 11/22/2016

## 2016-11-08 NOTE — Telephone Encounter (Signed)
Patient states she is having pain in her left hip, left leg and ankle. Patient states the pain started on Wednesday. Patient denies any injury to the area. Patient states it is an aching pain. Patient is requesting an appointment for an injection.

## 2016-11-08 NOTE — Telephone Encounter (Signed)
Patient has been scheduled for 11/08/16 for 9:45 am

## 2016-11-08 NOTE — Progress Notes (Signed)
I followed up with patient by calling her at 2:00. She reports that she still doing really well with her left leg pain. She also has pedal edema and she is elevating her leg and we discussed the proper way of elevating the leg.

## 2016-11-08 NOTE — Telephone Encounter (Signed)
Attempted to contact the patient and left message for patient to call the office.  

## 2016-11-08 NOTE — Progress Notes (Signed)
   Procedure Note  Patient: Catherine HurterCarolyn [name] Mow             Date of Birth: April 14, 1936           MRN: [id number]             Visit Date: 11/08/2016  Procedures: Visit Diagnoses: Baker's cyst of knee, left - 11/08/2016: Injected with 40 mg of Kenalog mixed with one half and also 1% lidocaine without epinephrine.Patient improved few minutes after the injection  Fibromyalgia  Pedal edema  Acute pain of left knee  Pain in left leg  Note: There is no DVT per examination of the left leg/leg calf no tenderness to palpation of the left calf. No change in diameter of left leg calf  Large Joint Inj Date/Time: 11/08/2016 10:54 AM Performed by: Tawni PummelPANWALA, Pavlos Yon Authorized by: Tawni PummelPANWALA, Makya Phillis   Consent Given by:  Patient Site marked: the procedure site was marked   Timeout: prior to procedure the correct patient, procedure, and site was verified   Indications:  Pain and joint swelling Location:  Knee Site:  L knee Prep: patient was prepped and draped in usual sterile fashion   Needle Size:  27 G Needle Length:  1.5 inches Approach:  Medial Ultrasound Guidance: No   Fluoroscopic Guidance: No   Arthrogram: No   Medications:  1.5 mL lidocaine 1 %; 40 mg triamcinolone acetonide 40 MG/ML Aspiration Attempted: Yes   Patient tolerance:  Patient tolerated the procedure well with no immediate complications   Patient states she is having pain in her left hip, left leg and ankle. Patient states the pain started on Wednesday. Patient denies any injury to the area. Patient states it is an aching pain. Patient is requesting an appointment for an injection.  Evaluation of the patient's left leg shows the following: #1: No DVT. #2: Pedal edema of the left foot #3: Left knee with Baker's cyst #4: No greater trochanteric bursitis of the left hip and no left SI joint pain  As a result, I've given the patient 40 mg of Kenalog mixed with one half and also 1% lidocaine without epinephrine. Patient  tolerated procedure well. Patient's symptoms started approximately a week ago with no falls or injuries/trauma. It got worse this past Wednesday. She could not get in to see her PCP. She's been quite active lately and hosting her family for the [facility]. She's been on her feet a lot and doing a lot of walking. However when the pain got worse she was unable to continue her regular activities.  I've advised the patient to avoid overuse of the left knee at this time. She should advance her activity as tolerated.  For the pedal edema, I've asked the patient to elevate her minimal use of her left leg currently and she can increase activity as tolerated  Return to clinic in 2 weeks for follow-up. She can cancel appointment with me if she gets into see her PCP, Dr. Renae GlossShelton. Dr. Renae GlossShelton may want an opportunity to increase her fluid pills if appropriate.

## 2016-11-15 ENCOUNTER — Encounter: Payer: Self-pay | Admitting: *Deleted

## 2016-11-15 DIAGNOSIS — Z006 Encounter for examination for normal comparison and control in clinical research program: Secondary | ICD-10-CM

## 2016-11-15 NOTE — Progress Notes (Signed)
Patient was seen for 30-day Orion study visit. Patient vital signs are stable. Patient stated she did have severe shooting arm and leg pain post injection. Patient stated it is better at the present time. I will see patient in 60 days for next study visit.

## 2016-11-19 ENCOUNTER — Other Ambulatory Visit: Payer: Self-pay | Admitting: Cardiology

## 2016-11-27 DIAGNOSIS — R5383 Other fatigue: Secondary | ICD-10-CM | POA: Insufficient documentation

## 2016-11-27 DIAGNOSIS — G4709 Other insomnia: Secondary | ICD-10-CM | POA: Insufficient documentation

## 2016-11-27 DIAGNOSIS — E7800 Pure hypercholesterolemia, unspecified: Secondary | ICD-10-CM | POA: Insufficient documentation

## 2016-11-27 DIAGNOSIS — M170 Bilateral primary osteoarthritis of knee: Secondary | ICD-10-CM | POA: Insufficient documentation

## 2016-11-27 DIAGNOSIS — M797 Fibromyalgia: Secondary | ICD-10-CM | POA: Insufficient documentation

## 2016-11-27 DIAGNOSIS — M1610 Unilateral primary osteoarthritis, unspecified hip: Secondary | ICD-10-CM | POA: Insufficient documentation

## 2016-11-27 NOTE — Progress Notes (Signed)
Office Visit Note  Patient: [name]             Date of Birth: [birth year]           MRN: [id number]             PCP: [name]., MD Referring: Andi Devon, MD Visit Date: 11/28/2016 Occupation: @GUAROCC @    Subjective:  Follow-up   History of Present Illness: [name] is a 82 y.o. female   Follow-up on fibromyalgia. I gave her an injection in her knee for Baker's cyst. Patient's left knee is doing very well and she is pain-free.  She states that her fibromyalgia is doing really well lately. She's had a healthy balance between activity rest and medication. I've encouraged the patient to continue her current lifestyle with the understanding to do water aerobic exercises if tolerated well.  Activities of Daily Living:  Patient reports morning stiffness for 15 minutes.   Patient Denies nocturnal pain.  Difficulty dressing/grooming: Denies Difficulty climbing stairs: Denies Difficulty getting out of chair: Denies Difficulty using hands for taps, buttons, cutlery, and/or writing: Denies   Review of Systems  Constitutional: Positive for fatigue.  HENT: Negative for mouth sores and mouth dryness.   Eyes: Negative for dryness.  Respiratory: Negative for shortness of breath.   Gastrointestinal: Negative for constipation and diarrhea.  Musculoskeletal: Positive for myalgias and myalgias.  Skin: Negative for sensitivity to sunlight.  Psychiatric/Behavioral: Positive for sleep disturbance. Negative for decreased concentration.    PMFS History:  Patient Active Problem List   Diagnosis Date Noted  . Primary osteoarthritis of both knees 11/27/2016  . Osteoarthritis of pelvis 11/27/2016  . High cholesterol 11/27/2016  . Fibromyalgia syndrome 11/27/2016  . Other fatigue 11/27/2016  . Other insomnia 11/27/2016  . Snoring 06/28/2016  . Chronic diastolic CHF (congestive heart failure) (HCC) 01/16/2016  . Schatzki's ring 11/11/2014  .  Hiatal hernia 11/11/2014  . Essential hypertension   . CAP (community acquired pneumonia) 05/02/2014  . Facial numbness 03/24/2014  . Dysuria 01/26/2014  . Atrophic vaginitis 01/26/2014  . Hyperlipidemia 07/23/2013  . Old MI (myocardial infarction)     Class: Chronic  . Nausea and vomiting 11/13/2011  . Vertigo, benign positional 07/07/2011  . CAD (coronary artery disease) 07/07/2011  . A-fib (HCC) 07/07/2011    Past Medical History:  Diagnosis Date  . Anxiety   . Arthritis    Multiple joints  . Chronic diastolic CHF (congestive heart failure) (HCC) 01/16/2016  . Coronary artery disease    a. s/p DES to LAD 2006. b. DES to Cx 2014. c. angiosculpt scoring balloon/DES to LAD 2016. d. DES to RCA 12/2015.  . Fibromyalgia   . GERD (gastroesophageal reflux disease)   . High cholesterol   . History of stomach ulcers    Remotely  . Hypertension   . Lower extremity edema    a. h/o chronic edema L>R.  . Osteoporosis   . PAF (paroxysmal atrial fibrillation) (HCC)    a. 2012 - H/P says atrial flutter, D/c summary says atrial fib - was tx with IV amiodarone, only had a few hours of arrhythmias, no clinical recurrence as of 2017.  [name] Ventricular hypertrophy   . Vertigo 08/27/2006    Family History  Problem Relation Age of Onset  . Stroke Mother   . Heart attack Father    Past Surgical History:  Procedure Laterality Date  . ABDOMINAL HYSTERECTOMY  1970's  . BREAST BIOPSY Right 1970's  .  BREAST LUMPECTOMY Right 1970's   benign   . CARDIAC CATHETERIZATION  10/2006   [name] 08/03/2007 (01/27/2013)  . CARDIAC CATHETERIZATION N/A 01/01/2016   Procedure: Left Heart Cath and Coronary Angiography;  Surgeon: Kathleene Hazel, MD;  Location: [facility] INVASIVE CV LAB;  Service: Cardiovascular;  Laterality: N/A;  . CARDIAC CATHETERIZATION N/A 01/01/2016   Procedure: Coronary Stent Intervention;  Surgeon: Kathleene Hazel, MD;  Location: [facility] INVASIVE CV LAB;  Service: Cardiovascular;  Laterality:  N/A;  . CORONARY ANGIOPLASTY WITH STENT PLACEMENT  06/2005; 01/27/2013    2.515 mm Cypher stent to LAD in 2006; 2.75 x 12 Promus DES to the circumflex 2014    . DIAGNOSTIC LAPAROSCOPY  2000   lap abdominal lysis of adhesions  . ESOPHAGOGASTRODUODENOSCOPY N/A 11/10/2014   Procedure: ESOPHAGOGASTRODUODENOSCOPY (EGD);  Surgeon: Jeani Hawking, MD;  Location: [facility] ENDOSCOPY;  Service: Endoscopy;  Laterality: N/A;  . HERNIA REPAIR    . LAPAROSCOPIC SALPINGO OOPHERECTOMY  2000  . LAPAROTOMY     "day after oophorectomy; had bowel obstruction" (01/27/2013)  . LEFT HEART CATHETERIZATION WITH CORONARY ANGIOGRAM N/A 01/27/2013   Procedure: LEFT HEART CATHETERIZATION WITH CORONARY ANGIOGRAM;  Surgeon: Corky Crafts, MD;  Location: [facility] CATH LAB;  Service: Cardiovascular;  Laterality: N/A;  . LEFT HEART CATHETERIZATION WITH CORONARY ANGIOGRAM N/A 11/10/2014   Procedure: LEFT HEART CATHETERIZATION WITH CORONARY ANGIOGRAM;  Surgeon: Lennette Bihari, MD; left main okay, LAD 75% ISR, FFR 0.75, s/p Angiosculpt scoring balloon and 2.7518 mm Resolute DES, CFX 70%, stent patent, RCA 50%  . TONSILLECTOMY AND ADENOIDECTOMY  ~ 31   Social History   Social History Narrative  . No narrative on file     Objective: Vital Signs: BP 126/82   Pulse 78   Wt 215 lb (97.5 kg)   BMI 33.67 kg/m    Physical Exam  Constitutional: She is oriented to person, place, and time. She appears well-developed and well-nourished.  HENT:  Head: Normocephalic and atraumatic.  Eyes: EOM are normal. Pupils are equal, round, and reactive to light.  Cardiovascular: Normal rate, regular rhythm and normal heart sounds.  Exam reveals no gallop and no friction rub.   No murmur heard. Pulmonary/Chest: Effort normal and breath sounds normal. She has no wheezes. She has no rales.  Abdominal: Soft. Bowel sounds are normal. She exhibits no distension. There is no tenderness. There is no guarding. No hernia.  Musculoskeletal: Normal range of  motion. She exhibits no edema, tenderness or deformity.  Lymphadenopathy:    She has no cervical adenopathy.  Neurological: She is alert and oriented to person, place, and time. Coordination normal.  Skin: Skin is warm and dry. Capillary refill takes less than 2 seconds. No rash noted.  Psychiatric: She has a normal mood and affect. Her behavior is normal.  Nursing note and vitals reviewed.    Musculoskeletal Exam:  Full range of motion of all joints Grip strength is equal and strong bilaterally Fiber myalgia tender points are 2 out of 18 positive ( bilateral trapezius muscle )  CDAI Exam: CDAI Homunculus Exam:   Joint Counts:  CDAI Tender Joint count: 0 CDAI Swollen Joint count: 0  Global Assessments:  Patient Global Assessment: 3 Provider Global Assessment: 3  CDAI Calculated Score: 6    Investigation: Findings:  Labs from June 19, 2015, shows CMP with GFR normal, CBC with diff is normal.  Urine drug screen is normal except for opioids and benzodiazepines  Heart Attack June 2014, S/P Stent,  Severe TMJ Mri/CT Fall 2015  Appointment on 07/12/2016  Component Date Value Ref Range Status  . Rest HR 07/12/2016 73  bpm Final  . Rest BP 07/12/2016 157/79  mmHg Final  . Peak HR 07/12/2016 153  bpm Final  . Peak BP 07/12/2016 181/74  mmHg Final  . SSS 07/12/2016 5   Final  . SRS 07/12/2016 3   Final  . SDS 07/12/2016 2   Final  . LHR 07/12/2016 0.36   Final  . TID 07/12/2016 0.80   Final  . LV sys vol 07/12/2016 12  mL Final  . LV dias vol 07/12/2016 42  46 - 106 mL Final    Labs scanned into patient's chart from 10/04/2016 shows CBC with differential and CMP with GFR. Please see labs will details but essentially they're within normal limits. Patient is in the study for cholesterol with 1500 other participants across the [place]. Patient is not tolerating statins in the study explores that issue  Imaging: No results found.  Speciality Comments: No specialty  comments available.    Procedures:  No procedures performed Allergies: Adhesive [tape]; Statins; Codeine; and Elavil [amitriptyline]   Assessment / Plan:     Visit Diagnoses: Primary osteoarthritis of both knees  Fibromyalgia syndrome  Other fatigue  Other insomnia  Osteoarthritis of pelvis  High cholesterol  Plan: #1: Fiberomyalgia. Patient is doing really well with the fibromyalgia. Is exercising taking the appropriate medications and finding healthy balance in her activity level and her fibromyalgia discomfort. She states that it stays below 3 in the last recent months.  #2: Fatigue and insomnia are also well addressed.  #3: Patient does not need any labs repeated today since there is scant labs from February 2018 and she does not need any meds refilled but she will call his she does  #4: The Baker's cyst injection help the patient up tremendously. She is doing very well and able to ambulate without pain or discomfort.  #5: Return to clinic in 6 months but patient is welcome to call our office if she needs any intervention in between.  Orders: No orders of the defined types were placed in this encounter.  No orders of the defined types were placed in this encounter.   Face-to-face time spent with patient was 30 minutes. 50% of time was spent in counseling and coordination of care.  Follow-Up Instructions: No Follow-up on file.   Tawni PummelNaitik Jawaun Celmer, PA-C  Note - This record has been created using [employer] software.  Chart creation errors have been sought, but may not always  have been located. Such creation errors do not reflect on  the standard of medical care.

## 2016-11-28 ENCOUNTER — Encounter: Payer: Self-pay | Admitting: Rheumatology

## 2016-11-28 ENCOUNTER — Ambulatory Visit (INDEPENDENT_AMBULATORY_CARE_PROVIDER_SITE_OTHER): Payer: Medicare PPO | Admitting: Rheumatology

## 2016-11-28 DIAGNOSIS — M1610 Unilateral primary osteoarthritis, unspecified hip: Secondary | ICD-10-CM

## 2016-11-28 DIAGNOSIS — M170 Bilateral primary osteoarthritis of knee: Secondary | ICD-10-CM

## 2016-11-28 DIAGNOSIS — M797 Fibromyalgia: Secondary | ICD-10-CM | POA: Diagnosis not present

## 2016-11-28 DIAGNOSIS — R5383 Other fatigue: Secondary | ICD-10-CM

## 2016-11-28 DIAGNOSIS — G4709 Other insomnia: Secondary | ICD-10-CM | POA: Diagnosis not present

## 2016-11-28 DIAGNOSIS — E7800 Pure hypercholesterolemia, unspecified: Secondary | ICD-10-CM | POA: Diagnosis not present

## 2017-01-05 ENCOUNTER — Other Ambulatory Visit: Payer: Self-pay | Admitting: Cardiology

## 2017-01-06 ENCOUNTER — Other Ambulatory Visit: Payer: Self-pay | Admitting: Physician Assistant

## 2017-01-06 DIAGNOSIS — I5032 Chronic diastolic (congestive) heart failure: Secondary | ICD-10-CM

## 2017-01-08 ENCOUNTER — Encounter (HOSPITAL_COMMUNITY): Payer: Self-pay

## 2017-01-08 ENCOUNTER — Emergency Department (HOSPITAL_COMMUNITY)
Admission: EM | Admit: 2017-01-08 | Discharge: 2017-01-09 | Disposition: A | Discharge status: Home / Self Care | Payer: Medicare PPO | Attending: Emergency Medicine | Admitting: Emergency Medicine

## 2017-01-08 DIAGNOSIS — D649 Anemia, unspecified: Secondary | ICD-10-CM | POA: Diagnosis not present

## 2017-01-08 DIAGNOSIS — I2510 Atherosclerotic heart disease of native coronary artery without angina pectoris: Secondary | ICD-10-CM | POA: Insufficient documentation

## 2017-01-08 DIAGNOSIS — Z7982 Long term (current) use of aspirin: Secondary | ICD-10-CM | POA: Diagnosis not present

## 2017-01-08 DIAGNOSIS — N289 Disorder of kidney and ureter, unspecified: Secondary | ICD-10-CM | POA: Insufficient documentation

## 2017-01-08 DIAGNOSIS — M79604 Pain in right leg: Secondary | ICD-10-CM | POA: Insufficient documentation

## 2017-01-08 DIAGNOSIS — Z87891 Personal history of nicotine dependence: Secondary | ICD-10-CM | POA: Insufficient documentation

## 2017-01-08 DIAGNOSIS — Z79899 Other long term (current) drug therapy: Secondary | ICD-10-CM | POA: Insufficient documentation

## 2017-01-08 DIAGNOSIS — Z955 Presence of coronary angioplasty implant and graft: Secondary | ICD-10-CM | POA: Diagnosis not present

## 2017-01-08 DIAGNOSIS — M79605 Pain in left leg: Secondary | ICD-10-CM | POA: Diagnosis not present

## 2017-01-08 DIAGNOSIS — I5032 Chronic diastolic (congestive) heart failure: Secondary | ICD-10-CM | POA: Insufficient documentation

## 2017-01-08 DIAGNOSIS — I110 Hypertensive heart disease with heart failure: Secondary | ICD-10-CM | POA: Insufficient documentation

## 2017-01-08 LAB — CBC WITH DIFFERENTIAL/PLATELET
Basophils Absolute: 0 10*3/uL (ref 0.0–0.1)
Basophils Relative: 1 %
Eosinophils Absolute: 0.2 10*3/uL (ref 0.0–0.7)
Eosinophils Relative: 2 %
HEMATOCRIT: 34.4 % — AB (ref 36.0–46.0)
HEMOGLOBIN: 11 g/dL — AB (ref 12.0–15.0)
LYMPHS ABS: 2.8 10*3/uL (ref 0.7–4.0)
LYMPHS PCT: 39 %
MCH: 27.2 pg (ref 26.0–34.0)
MCHC: 32 g/dL (ref 30.0–36.0)
MCV: 84.9 fL (ref 78.0–100.0)
Monocytes Absolute: 0.5 10*3/uL (ref 0.1–1.0)
Monocytes Relative: 7 %
NEUTROS ABS: 3.7 10*3/uL (ref 1.7–7.7)
NEUTROS PCT: 51 %
Platelets: 203 10*3/uL (ref 150–400)
RBC: 4.05 MIL/uL (ref 3.87–5.11)
RDW: 15.4 % (ref 11.5–15.5)
WBC: 7.2 10*3/uL (ref 4.0–10.5)

## 2017-01-08 LAB — COMPREHENSIVE METABOLIC PANEL
ALT: 17 U/L (ref 14–54)
ANION GAP: 10 (ref 5–15)
AST: 22 U/L (ref 15–41)
Albumin: 3.8 g/dL (ref 3.5–5.0)
Alkaline Phosphatase: 76 U/L (ref 38–126)
BILIRUBIN TOTAL: 0.2 mg/dL — AB (ref 0.3–1.2)
BUN: 22 mg/dL — ABNORMAL HIGH (ref 6–20)
CHLORIDE: 101 mmol/L (ref 101–111)
CO2: 29 mmol/L (ref 22–32)
Calcium: 9.5 mg/dL (ref 8.9–10.3)
Creatinine, Ser: 1.65 mg/dL — ABNORMAL HIGH (ref 0.44–1.00)
GFR, EST AFRICAN AMERICAN: 33 mL/min — AB (ref >60)
GFR, EST NON AFRICAN AMERICAN: 29 mL/min — AB (ref >60)
Glucose, Bld: 108 mg/dL — ABNORMAL HIGH (ref 65–99)
POTASSIUM: 3.7 mmol/L (ref 3.5–5.1)
Sodium: 140 mmol/L (ref 135–145)
TOTAL PROTEIN: 6.7 g/dL (ref 6.5–8.1)

## 2017-01-08 NOTE — ED Provider Notes (Addendum)
[facility]-EMERGENCY DEPT Provider Note   CSN: [id number] Arrival date & time: 01/08/17  2140 By signing my name below, I, Rosana Fret, attest that this documentation has been prepared under the direction and in the presence of Dione Booze, MD. Electronically Signed: Rosana Fret, ED Scribe. 01/08/17. 11:56 PM.  History   Chief Complaint Chief Complaint  Patient presents with  . Leg Pain    The history is provided by the patient. No language interpreter was used.   HPI Comments: [name] is a 81 y.o. female with a hx of CHF, CAD, HLD, fibromyalgia, and PAF who presents to the Emergency Department complaining of constant, gradually worsening bilateral leg pain onset 1 week ago. She describes her pain to be shooting down the inside of her legs from her hips and a generalized heaviness feeling. Pt states her pain is exacerbated by ambulation with a severity of 10/10 while walking; pt rates her pain as 7/10 while at rest. She reports associated weakness and numbness to BLE, difficulty urinating, urinary urgency, suprapubic abdominal pain, neck pain and back pain. Pt states she has been treating her leg pain with heat, cold packs, and a topical rub with no significant relief. Per pt, she went to her PCP a week ago and was prescribed antibiotics for a UTI and has noticed no improvements in her symptoms. At that time, pt had both a UA and urine culture done. She reports she is currently taking Crestor. She is currently a participant in an Orion-10 research study. Pt denies fever, chills, or diaphoresis.  Past Medical History:  Diagnosis Date  . Anxiety   . Arthritis    Multiple joints  . Chronic diastolic CHF (congestive heart failure) (HCC) 01/16/2016  . Coronary artery disease    a. s/p DES to LAD 2006. b. DES to Cx 2014. c. angiosculpt scoring balloon/DES to LAD 2016. d. DES to RCA 12/2015.  . Fibromyalgia   . GERD (gastroesophageal reflux disease)   . High cholesterol   .  History of stomach ulcers    Remotely  . Hypertension   . Lower extremity edema    a. h/o chronic edema L>R.  . Osteoporosis   . PAF (paroxysmal atrial fibrillation) (HCC)    a. 2012 - H/P says atrial flutter, D/c summary says atrial fib - was tx with IV amiodarone, only had a few hours of arrhythmias, no clinical recurrence as of 2017.  [name] Ventricular hypertrophy   . Vertigo 08/27/2006    Patient Active Problem List   Diagnosis Date Noted  . Primary osteoarthritis of both knees 11/27/2016  . Osteoarthritis of pelvis 11/27/2016  . High cholesterol 11/27/2016  . Fibromyalgia syndrome 11/27/2016  . Other fatigue 11/27/2016  . Other insomnia 11/27/2016  . Snoring 06/28/2016  . Chronic diastolic CHF (congestive heart failure) (HCC) 01/16/2016  . Schatzki's ring 11/11/2014  . Hiatal hernia 11/11/2014  . Essential hypertension   . CAP (community acquired pneumonia) 05/02/2014  . Facial numbness 03/24/2014  . Dysuria 01/26/2014  . Atrophic vaginitis 01/26/2014  . Hyperlipidemia 07/23/2013  . Old MI (myocardial infarction)     Class: Chronic  . Nausea and vomiting 11/13/2011  . Vertigo, benign positional 07/07/2011  . CAD (coronary artery disease) 07/07/2011  . A-fib (HCC) 07/07/2011    Past Surgical History:  Procedure Laterality Date  . ABDOMINAL HYSTERECTOMY  1970's  . BREAST BIOPSY Right 1970's  . BREAST LUMPECTOMY Right 1970's   benign   . CARDIAC CATHETERIZATION  10/2006   /  notes 08/03/2007 (01/27/2013)  . CARDIAC CATHETERIZATION N/A 01/01/2016   Procedure: Left Heart Cath and Coronary Angiography;  Surgeon: Kathleene Hazelhristopher D McAlhany, MD;  [facility];  Service: Cardiovascular;  Laterality: N/A;  . CARDIAC CATHETERIZATION N/A 01/01/2016   Procedure: Coronary Stent Intervention;  Surgeon: Kathleene Hazelhristopher D McAlhany, MD;  Location: [facility] INVASIVE CV LAB;  Service: Cardiovascular;  Laterality: N/A;  . CORONARY ANGIOPLASTY WITH STENT PLACEMENT  06/2005; 01/27/2013    2.515  mm Cypher stent to LAD in 2006; 2.75 x 12 Promus DES to the circumflex 2014    . DIAGNOSTIC LAPAROSCOPY  2000   lap abdominal lysis of adhesions  . ESOPHAGOGASTRODUODENOSCOPY N/A 11/10/2014   Procedure: ESOPHAGOGASTRODUODENOSCOPY (EGD);  Surgeon: Jeani HawkingPatrick Hung, MD;  [facility];  Service: Endoscopy;  Laterality: N/A;  . HERNIA REPAIR    . LAPAROSCOPIC SALPINGO OOPHERECTOMY  2000  . LAPAROTOMY     "day after oophorectomy; had bowel obstruction" (01/27/2013)  . LEFT HEART CATHETERIZATION WITH CORONARY ANGIOGRAM N/A 01/27/2013   Procedure: LEFT HEART CATHETERIZATION WITH CORONARY ANGIOGRAM;  Surgeon: Corky CraftsJayadeep S Varanasi, MD;  [facility];  Service: Cardiovascular;  Laterality: N/A;  . LEFT HEART CATHETERIZATION WITH CORONARY ANGIOGRAM N/A 11/10/2014   Procedure: LEFT HEART CATHETERIZATION WITH CORONARY ANGIOGRAM;  Surgeon: Lennette Biharihomas A Kelly, MD; left main okay, LAD 75% ISR, FFR 0.75, s/p Angiosculpt scoring balloon and 2.7518 mm Resolute DES, CFX 70%, stent patent, RCA 50%  . TONSILLECTOMY AND ADENOIDECTOMY  ~ 1952    OB History    No data available       Home Medications    Prior to Admission medications   Medication Sig Start Date End Date Taking? Authorizing Provider  acetaminophen (TYLENOL) 500 MG tablet Take 500 mg by mouth daily as needed (pain).    [provider]  albuterol (PROVENTIL HFA;VENTOLIN HFA) 108 (90 Base) MCG/ACT inhaler Inhale 2 puffs into the lungs as directed.    [provider]  ALPRAZolam Prudy Feeler(XANAX) 0.25 MG tablet Take 0.25 mg by mouth 2 (two) times daily as needed for anxiety. anxiety    [provider]  AMBULATORY NON FORMULARY MEDICATION Inject 300 mg into the skin as directed. Medication Name: Inclisiran sodium vs placebo 10/18/16   Herby AbrahamStuckey, Thomas D, MD  aspirin EC 81 MG tablet Take 81 mg by mouth daily.      [provider]  clopidogrel (PLAVIX) 75 MG tablet take 1 tablet by mouth once daily 01/07/17   Lyn RecordsSmith, Henry  W, MD  Diphenhydramine-APAP, sleep, (TYLENOL PM EXTRA STRENGTH PO) Take 1 tablet by mouth at bedtime as needed. For sleep    [provider]  fluticasone (FLONASE) 50 MCG/ACT nasal spray Place 2 sprays into both nostrils daily. 05/06/14   Esperanza SheetsBuriev, Ulugbek N, MD  HYDROcodone-acetaminophen (NORCO/VICODIN) 5-325 MG per tablet Take 0.5-1 tablets by mouth every 6 (six) hours as needed for moderate pain. 11/12/14   Osvaldo ShipperKrishnan, Gokul, MD  lidocaine (LIDODERM) 5 % Place 1 patch onto the skin daily as needed (pain).  09/27/14   [provider]  loratadine (CLARITIN) 10 MG tablet Take 10 mg by mouth daily. Reported on 12/29/2015    [provider]  LYRICA 75 MG capsule Take 75 mg by mouth at bedtime.  10/10/14   [provider]  metoprolol tartrate (LOPRESSOR) 25 MG tablet Take 1 tablet (25 mg total) by mouth 2 (two) times daily. 07/04/16 10/02/16  Robbie LisSimmons, Brittainy M, PA-C  nitroGLYCERIN (NITROSTAT) 0.4 MG SL tablet Place  1 tablet (0.4 mg total) under the tongue every 5 (five) minutes as needed for chest pain (CP or SOB). 01/02/16   Robbie Lis M, PA-C  pantoprazole (PROTONIX) 40 MG tablet take 1 tablet by mouth once daily AT 6 AM 11/19/16   Robbie Lis M, PA-C  potassium chloride SA (K-DUR,KLOR-CON) 20 MEQ tablet take 1 tablet by mouth once daily 01/06/17   Tereso Newcomer T, PA-C  Probiotic Product (ALIGN PO) Take 1 tablet by mouth daily.    [provider]  rosuvastatin (CRESTOR) 10 MG tablet Take 10 mg by mouth 2 (two) times a week.    [provider]  torsemide (DEMADEX) 20 MG tablet Take 20 mg by mouth daily. Take 1 to 2 tablets by mouth daily as needed for swelling.    [provider]  valsartan-hydrochlorothiazide (DIOVAN-HCT) 160-12.5 MG per tablet Take 1 tablet by mouth daily.  07/05/13   [provider]  Vitamin D, Ergocalciferol, (DRISDOL) 50000 UNITS CAPS capsule Take 50,000 Units by mouth every 7 (seven) days. Take on Mondays     [provider]  zolpidem (AMBIEN) 10 MG tablet Take 5 mg by mouth daily as needed for sleep. Reported on 12/29/2015    [provider]    Family History Family History  Problem Relation Age of Onset  . Stroke Mother   . Heart attack Father     Social History Social History  Substance Use Topics  . Smoking status: Former Smoker    Packs/day: 0.12    Years: 36.00    Types: Cigarettes    Quit date: 12/02/1991  . Smokeless tobacco: Never Used  . Alcohol use No     Allergies   Adhesive [tape]; Statins; Codeine; and Elavil [amitriptyline]   Review of Systems Review of Systems  Constitutional: Negative for chills, diaphoresis and fever.  Gastrointestinal: Positive for abdominal pain.  Genitourinary: Positive for difficulty urinating and urgency.  Musculoskeletal: Positive for back pain, myalgias and neck pain.  Neurological: Positive for weakness and numbness.  All other systems reviewed and are negative.    Physical Exam Updated Vital Signs BP 110/82 (BP Location: Left Arm)   Pulse 82   Temp 98 F (36.7 C) (Oral)   Resp 16   Ht 5\' 7"  (1.702 m)   Wt 210 lb (95.3 kg)   SpO2 98%   BMI 32.89 kg/m   Physical Exam  Constitutional: She is oriented to person, place, and time. She appears well-developed and well-nourished.  HENT:  Head: Normocephalic and atraumatic.  Eyes: EOM are normal. Pupils are equal, round, and reactive to light.  Neck: Normal range of motion. Neck supple. No JVD present.  Cardiovascular: Normal rate, regular rhythm and normal heart sounds.   No murmur heard. Pulmonary/Chest: Effort normal and breath sounds normal. She has no wheezes. She has no rales. She exhibits no tenderness.  Abdominal: Soft. Bowel sounds are normal. She exhibits no distension and no mass. There is tenderness.  Mild suprapubic tenderness.   Musculoskeletal: Normal range of motion. She exhibits edema and tenderness.  Tenderness bilateral hips. Positive  straight leg raise bilateral at 15 degrees. 1+ pre-tibial edema.  Lymphadenopathy:    She has no cervical adenopathy.  Neurological: She is alert and oriented to person, place, and time. No cranial nerve deficit. She exhibits normal muscle tone. Coordination normal.  Skin: Skin is warm and dry. No rash noted.  Psychiatric: She has a normal mood and affect. Her behavior is normal. Judgment and  thought content normal.  Nursing note and vitals reviewed.    ED Treatments / Results  DIAGNOSTIC STUDIES: Oxygen Saturation is 98% on RA, normal by my interpretation.   COORDINATION OF CARE: 12:01 AM-Will order UA and pain medication. Pt verbalized understanding and is agreeable with the plan.   Labs (all labs ordered are listed, but only abnormal results are displayed) Labs Reviewed  COMPREHENSIVE METABOLIC PANEL - Abnormal; Notable for the following:       Result Value   Glucose, Bld 108 (*)    BUN 22 (*)    Creatinine, Ser 1.65 (*)    Total Bilirubin 0.2 (*)    GFR calc non Af Amer 29 (*)    GFR calc Af Amer 33 (*)    All other components within normal limits  CBC WITH DIFFERENTIAL/PLATELET - Abnormal; Notable for the following:    Hemoglobin 11.0 (*)    HCT 34.4 (*)    All other components within normal limits  URINALYSIS, ROUTINE W REFLEX MICROSCOPIC  CK     Procedures Procedures (including critical care time)  Medications Ordered in ED Medications  oxyCODONE-acetaminophen (PERCOCET/ROXICET) 5-325 MG per tablet 1 tablet (not administered)  oxyCODONE-acetaminophen (PERCOCET/ROXICET) 5-325 MG per tablet 1 tablet (1 tablet Oral Given 01/09/17 0045)     Initial Impression / Assessment and Plan / ED Course  I have reviewed the triage vital signs and the nursing notes.  Pertinent lab results that were available during my care of the patient were reviewed by me and considered in my medical decision making (see chart for details).  Lower back and leg pain which seems to be  maximum in the pelvic girdle and hip area. This does appear to be musculoskeletal in nature. She does have peripheral edema but no tenderness. Of note, review of old records does show history of fibromyalgia, Baker's cyst, diastolic CHF. She is also been started on inclisiran as part of an STD additional study. She is on rosuvastatin as well. Suspect myalgias secondary to either rosuvastatin or inclisiran. Screening labs show drop in hemoglobin from 1 year ago, and rise in creatinine from 1 year ago. However, review of more distant records shows her hemoglobin is currently at what seems to be her baseline. Creatinine is a clear change from 1 year ago. Will avoid NSAIDs because of worsening renal function. She's given oxycodone-acetaminophen for pain and will check CK.  She had fairly good relief with above noted medication. CK has come back normal. Still possibility of muscle pain from either rosuvastatin or inclisiran. She is discharged with prescription for oxycodone have acetaminophen and is referred back to the cardiology research clinic regarding whether she should continue on inclisiran.  Final Clinical Impressions(s) / ED Diagnoses   Final diagnoses:  Leg pain, bilateral  Renal insufficiency  Normochromic normocytic anemia    New Prescriptions New Prescriptions   OXYCODONE-ACETAMINOPHEN (PERCOCET) 5-325 MG TABLET    Take 1 tablet by mouth every 4 (four) hours as needed for moderate pain.   I personally performed the services described in this documentation, which was scribed in my presence. The recorded information has been reviewed and is accurate.       Dione Booze, MD 01/09/17 0981    Dione Booze, MD 01/09/17 [phone]

## 2017-01-08 NOTE — ED Triage Notes (Signed)
Pt endorses bilateral leg pain x 1 week with swelling that is worse when ambulating. Pt has CHF but denies any worsening shob. Edema noted to both extremities.

## 2017-01-09 DIAGNOSIS — M79604 Pain in right leg: Secondary | ICD-10-CM | POA: Diagnosis not present

## 2017-01-09 LAB — URINALYSIS, ROUTINE W REFLEX MICROSCOPIC
BILIRUBIN URINE: NEGATIVE
Glucose, UA: NEGATIVE mg/dL
HGB URINE DIPSTICK: NEGATIVE
Ketones, ur: NEGATIVE mg/dL
Leukocytes, UA: NEGATIVE
Nitrite: NEGATIVE
PH: 6 (ref 5.0–8.0)
Protein, ur: NEGATIVE mg/dL
SPECIFIC GRAVITY, URINE: 1.008 (ref 1.005–1.030)

## 2017-01-09 LAB — CK: Total CK: 174 U/L (ref 38–234)

## 2017-01-09 MED ORDER — OXYCODONE-ACETAMINOPHEN 5-325 MG PO TABS
1.0000 | ORAL_TABLET | Freq: Once | ORAL | Status: AC
  Administered 2017-01-09: 1 via ORAL
  Filled 2017-01-09: qty 1

## 2017-01-09 MED ORDER — OXYCODONE-ACETAMINOPHEN 5-325 MG PO TABS: 1.0000 | ORAL_TABLET | ORAL | 0 refills | Status: DC | PRN

## 2017-01-09 MED ORDER — OXYCODONE-ACETAMINOPHEN 5-325 MG PO TABS
1.0000 | ORAL_TABLET | Freq: Once | ORAL | Status: DC
  Filled 2017-01-09: qty 1

## 2017-01-09 NOTE — Discharge Instructions (Signed)
Leg pain may be due to the research medication you're on, but also could be due to rosuvastatin. Please take oxycodone have acetaminophen as needed for pain. We were may make you drowsy and may constipate you. Call the cardiology research clinic in the morning for instructions regarding whether to continue on the research medication.

## 2017-01-13 ENCOUNTER — Encounter: Payer: Self-pay | Admitting: *Deleted

## 2017-01-13 ENCOUNTER — Telehealth: Payer: Self-pay | Admitting: Interventional Cardiology

## 2017-01-13 ENCOUNTER — Telehealth: Payer: Self-pay | Admitting: Pharmacist

## 2017-01-13 DIAGNOSIS — Z006 Encounter for examination for normal comparison and control in clinical research program: Secondary | ICD-10-CM

## 2017-01-13 NOTE — Telephone Encounter (Signed)
Pt scheduled in lipid clinic 5/15.

## 2017-01-13 NOTE — Telephone Encounter (Signed)
See separate encounter from 5/14.

## 2017-01-13 NOTE — Telephone Encounter (Signed)
Patient states that she is returning a missed call, thanks. °

## 2017-01-13 NOTE — Progress Notes (Signed)
I saw patient for 90-day Orion study visit. Patient had been in the ER for bilateral leg pain. Patient was assessed by Dr.Stuckey and the decision was made with patient to with hold study injection at this time. Dr.Stuckey wants patient to see Margaretmary DysMegan Supple to re-assess the Crestor . I spoke with Margaretmary DysMegan Supple and she will call patient to set up appointment. I will see patient back in 60 days for next study visit.

## 2017-01-13 NOTE — Telephone Encounter (Signed)
[facility] regarding pt. She is in the [place] trial investigating inclisiran injections. She went to the ED on 5/9 with bilateral leg pain. Per ED note, pain likely skeletomuscular in nature and likely secondary to either Crestor or inclisiran. Dr Riley KillStuckey would like pt to be seen in lipid clinic to see if we can modify lipid therapy and improve symptoms while keeping her in the [place] study.  LMOM for pt to return call - will plan to d/c Crestor for a few weeks and monitor if symptoms improve.     Maury DusGarman, [name] D, RN  Lyn RecordsSmith, Henry W, MD Cc: Herby AbrahamStuckey, Thomas D, MD; Chrystie NoseHilty, Kenneth C, MD; Awilda MetroSupple, Johnni Wunschel E, [facility]        Dr. Riley KillStuckey assessed the patient and the decision was made with patient to hold the injection today. Dr.Stuckey [facility] to re-assess the Crestor also. Dr.Stuckey said leg pain could possibly be related to study drug. I spoke with Aundra MilletMegan and she will have patient come in the lipid clinic.   Previous Messages    ----- Message -----  From: Lyn RecordsSmith, Henry W, MD  Sent: 01/11/2017  5:09 PM  To: Maury DusVivian D Garman, RN   Could the leg pain be a side effect or does she have bak problems?  ----- Message -----  From: Maury DusGarman, [name] D, RN  Sent: 01/10/2017  8:51 AM  To: Lyn RecordsHenry W Smith, MD, Chrystie NoseKenneth C Hilty, MD, *   [name] went to ER on 01/08/17 for bilateral leg pain . Patient takes Crestor 10 mg twice a week and she is in the Orion-10 study. She has received one injection and is scheduled to receive second injection Mon. 01/13/17 at 8am.  The ER doctor referred her back to us about whether she should take any more injections. Please advise.

## 2017-01-14 ENCOUNTER — Ambulatory Visit (INDEPENDENT_AMBULATORY_CARE_PROVIDER_SITE_OTHER): Payer: Medicare PPO | Admitting: Pharmacist

## 2017-01-14 VITALS — Wt 217.0 lb

## 2017-01-14 DIAGNOSIS — E785 Hyperlipidemia, unspecified: Secondary | ICD-10-CM

## 2017-01-14 NOTE — Progress Notes (Signed)
Patient ID: [name]                 DOB: 1936-06-02                    MRN: [id number]     HPI: [name] is a 81 y.o. female patient of Dr. Katrinka Blazing referred to lipid clinic by Dr. Riley Kill. Pt has a PMH of CAD s/p DES 12/2015, unstable angina, Afib not on anticoag, HFpEF, HTN, and HLD. Pt was previously seen in lipid clinic 1 year ago in May 2017. At that time, she was started on Crestor 10mg  3 times a week, which was later increased to 4 times a week. However, pt realized she was not able to tolerate this (~2 weeks later) and had to decrease her dosing back to 2 times a week.   On 08/20/16, pt was referred for the ORION-10 study due to inadequate lipid control. At the time of study enrollment patient was still taking Crestor 10mg  2 times a week. On 10/17/2016, the first injection of either inclisiran or placebo was given. At 30-day visit post injection, pt stated that she had severe shooting arm and leg pain post injection - this was worse than what she felt on Crestor alone. Pt continued her Crestor twice weekly and presented to the ED on 01/08/2017 for bilateral leg pain. After evaluation, this was felt to be musculoskeletal in nature, likely secondary to either Crestor or inclisiran. She was seen for her 90 day ORION follow up visit, however her second injection was not given due to recent myalgias. Pt presents today for further management.  Pt states that she has continued Crestor 10mg  twice weekly, and that her last injection was ~4 weeks ago. She continues to have leg and arm pain/aches. She noticed that the pain from the injection drug was the worst right after the injection, but persisted for nearly 3 months and is more severe the day after she takes her Crestor 10mg  doses. Despite these possible adverse effects, patient would like to remain enrolled in the study. She even asked about her most recent cholesterol panel to see what it improved to after the Crestor. It is evident  that she cares about her medical management.  Current Medications: ORION study - blinded to either inclisiran or placebo, Crestor 10mg  2 times weekly Intolerances: Atorvastatin 40mg , 20mg , and 10mg , rosuvastatin, simvastatin - myalgias; pitavastatin 2mg   "feels bad" Risk Factors: ASCVD, baseline LDL >190 mg/dL LDL goal: <40 mg/dL  Family History: mother - stroke, father - heart attack  Social History: former smoker 1/4 a pack per day for ~35 years, does not use alcohol  Labs: 10/04/2016: TC 210, TG 166, HDL 41, LDL 136 (baseline of ORION study: taking Crestor 10mg  2 times a week) 12/30/2015: TC 271, TG 90, HDL 49, LDL 204 (no therapy)  Past Medical History:  Diagnosis Date  . Anxiety   . Arthritis    Multiple joints  . Chronic diastolic CHF (congestive heart failure) (HCC) 01/16/2016  . Coronary artery disease    a. s/p DES to LAD 2006. b. DES to Cx 2014. c. angiosculpt scoring balloon/DES to LAD 2016. d. DES to RCA 12/2015.  . Fibromyalgia   . GERD (gastroesophageal reflux disease)   . High cholesterol   . History of stomach ulcers    Remotely  . Hypertension   . Lower extremity edema    a. h/o chronic edema L>R.  . Osteoporosis   .  PAF (paroxysmal atrial fibrillation) (HCC)    a. 2012 - H/P says atrial flutter, D/c summary says atrial fib - was tx with IV amiodarone, only had a few hours of arrhythmias, no clinical recurrence as of 2017.  [name] Ventricular hypertrophy   . Vertigo 08/27/2006    Current Outpatient Prescriptions on File Prior to Visit  Medication Sig Dispense Refill  . acetaminophen (TYLENOL) 500 MG tablet Take 500 mg by mouth daily as needed (pain).    [name] albuterol (PROVENTIL HFA;VENTOLIN HFA) 108 (90 Base) MCG/ACT inhaler Inhale 2 puffs into the lungs as directed.    [name] ALPRAZolam (XANAX) 0.25 MG tablet Take 0.25 mg by mouth 2 (two) times daily as needed for anxiety. anxiety    . AMBULATORY NON FORMULARY MEDICATION Inject 300 mg into the skin as directed. Medication  Name: Inclisiran sodium vs placebo    . aspirin EC 81 MG tablet Take 81 mg by mouth daily.      . clopidogrel (PLAVIX) 75 MG tablet take 1 tablet by mouth once daily 30 tablet 1  . Diphenhydramine-APAP, sleep, (TYLENOL PM EXTRA STRENGTH PO) Take 1 tablet by mouth at bedtime as needed. For sleep    . fluticasone (FLONASE) 50 MCG/ACT nasal spray Place 2 sprays into both nostrils daily. 1 g 2  . HYDROcodone-acetaminophen (NORCO/VICODIN) 5-325 MG per tablet Take 0.5-1 tablets by mouth every 6 (six) hours as needed for moderate pain. 20 tablet 0  . lidocaine (LIDODERM) 5 % Place 1 patch onto the skin daily as needed (pain).     [name] loratadine (CLARITIN) 10 MG tablet Take 10 mg by mouth daily. Reported on 12/29/2015    . LYRICA 75 MG capsule Take 75 mg by mouth at bedtime.     . metoprolol tartrate (LOPRESSOR) 25 MG tablet Take 1 tablet (25 mg total) by mouth 2 (two) times daily. 60 tablet 6  . nitroGLYCERIN (NITROSTAT) 0.4 MG SL tablet Place 1 tablet (0.4 mg total) under the tongue every 5 (five) minutes as needed for chest pain (CP or SOB). 25 tablet 2  . oxyCODONE-acetaminophen (PERCOCET) 5-325 MG tablet Take 1 tablet by mouth every 4 (four) hours as needed for moderate pain. 15 tablet 0  . pantoprazole (PROTONIX) 40 MG tablet take 1 tablet by mouth once daily AT 6 AM 30 tablet 8  . potassium chloride SA (K-DUR,KLOR-CON) 20 MEQ tablet take 1 tablet by mouth once daily 90 tablet 1  . Probiotic Product (ALIGN PO) Take 1 tablet by mouth daily.    . rosuvastatin (CRESTOR) 10 MG tablet Take 10 mg by mouth 2 (two) times a week.    . torsemide (DEMADEX) 20 MG tablet Take 20 mg by mouth daily. Take 1 to 2 tablets by mouth daily as needed for swelling.    . valsartan-hydrochlorothiazide (DIOVAN-HCT) 160-12.5 MG per tablet Take 1 tablet by mouth daily.     . Vitamin D, Ergocalciferol, (DRISDOL) 50000 UNITS CAPS capsule Take 50,000 Units by mouth every 7 (seven) days. Take on Mondays    . zolpidem (AMBIEN) 10 MG  tablet Take 5 mg by mouth daily as needed for sleep. Reported on 12/29/2015    . [DISCONTINUED] ezetimibe (ZETIA) 10 MG tablet Take 10 mg by mouth daily.      . [DISCONTINUED] metoprolol (TOPROL-XL) 50 MG 24 hr tablet Take 50 mg by mouth daily.      . [DISCONTINUED] potassium chloride (KLOR-CON) 20 MEQ packet Take 20 mEq by mouth daily.  No current facility-administered medications on file prior to visit.     Allergies  Allergen Reactions  . Adhesive [Tape] Itching and Rash  . Statins     Atorvastatin 40mg , 20mg , and 10mg , rosuvastatin, simvastatin - myalgias; pitavastatin 2mg   "feels bad". Tolerating Crestor 10mg  4x/week as of 2017  . Codeine Itching    Tolerates low dose of norco  . Elavil [Amitriptyline] Other (See Comments)    Dissociation    Assessment/Plan:  1. Hyperlipidemia - Pt seen at request of Dr Riley KillStuckey after significant myalgias thought to be secondary to either Crestor or ORION investigational drug. Ideally would prefer pt to remain in ORION study if possible, pt is also in agreement with this plan. Since her symptoms are exacerbated the days after she takes her Crestor, will stop Crestor 10mg  twice weekly. Advised pt to continue to monitor for worsening of symptoms. Am hopeful that symptoms will improve with Crestor discontinuation and [place] can remain [place]. Will route note to research to follow up with pt per protocol for delayed second injection of inclisiran or placebo.   Sherron MondayAubrey N. Urias Sheek, PharmD, BCPS Clinical Pharmacy Resident 01/14/17 9:59 AM    Aundra MilletMegan E. Supple, PharmD, CPP [facility] [address], [place], [place] [address] Phone: [phone]; Fax: [phone]

## 2017-02-06 ENCOUNTER — Other Ambulatory Visit: Payer: Self-pay | Admitting: Cardiology

## 2017-02-13 NOTE — Progress Notes (Deleted)
Cardiology Office Note    Date:  02/13/2017   ID:  [name], DOB 06/07/1936, MRN [id number]  PCP:  Catherine Devon, MD  Cardiologist: Catherine Noe, MD   No chief complaint on file.   History of Present Illness:  [name] is a 81 y.o. female with a h/o CAD (s/p DES to LAD 2006, DES to Cx 2014, angiosculpt scoring balloon/DES to LAD 2016, DES to RCA 12/2015), chronic diastolic CHF, HTN, atrial fib/flutter, hyperlipidemia, pre-diabetes, chronic LEE (L>R), fibromyalgia, anxiety, remote h/o stomach ulcers.     Past Medical History:  Diagnosis Date  . Anxiety   . Arthritis    Multiple joints  . Chronic diastolic CHF (congestive heart failure) (HCC) 01/16/2016  . Coronary artery disease    a. s/p DES to LAD 2006. b. DES to Cx 2014. c. angiosculpt scoring balloon/DES to LAD 2016. d. DES to RCA 12/2015.  . Fibromyalgia   . GERD (gastroesophageal reflux disease)   . High cholesterol   . History of stomach ulcers    Remotely  . Hypertension   . Lower extremity edema    a. h/o chronic edema L>R.  . Osteoporosis   . PAF (paroxysmal atrial fibrillation) (HCC)    a. 2012 - H/P says atrial flutter, D/c summary says atrial fib - was tx with IV amiodarone, only had a few hours of arrhythmias, no clinical recurrence as of 2017.  [name] Ventricular hypertrophy   . Vertigo 08/27/2006    Past Surgical History:  Procedure Laterality Date  . ABDOMINAL HYSTERECTOMY  1970's  . BREAST BIOPSY Right 1970's  . BREAST LUMPECTOMY Right 1970's   benign   . CARDIAC CATHETERIZATION  10/2006   [name] 08/03/2007 (01/27/2013)  . CARDIAC CATHETERIZATION N/A 01/01/2016   Procedure: Left Heart Cath and Coronary Angiography;  Surgeon: Kathleene Hazel, MD;  Location: [facility] INVASIVE CV LAB;  Service: Cardiovascular;  Laterality: N/A;  . CARDIAC CATHETERIZATION N/A 01/01/2016   Procedure: Coronary Stent Intervention;  Surgeon: Kathleene Hazel, MD;  Location: [facility] INVASIVE CV LAB;   Service: Cardiovascular;  Laterality: N/A;  . CORONARY ANGIOPLASTY WITH STENT PLACEMENT  06/2005; 01/27/2013    2.515 mm Cypher stent to LAD in 2006; 2.75 x 12 Promus DES to the circumflex 2014    . DIAGNOSTIC LAPAROSCOPY  2000   lap abdominal lysis of adhesions  . ESOPHAGOGASTRODUODENOSCOPY N/A 11/10/2014   Procedure: ESOPHAGOGASTRODUODENOSCOPY (EGD);  Surgeon: Catherine Hawking, MD;  Location[facility] ENDOSCOPY;  Service: Endoscopy;  Laterality: N/A;  . HERNIA REPAIR    . LAPAROSCOPIC SALPINGO OOPHERECTOMY  2000  . LAPAROTOMY     "day after oophorectomy; had bowel obstruction" (01/27/2013)  . LEFT HEART CATHETERIZATION WITH CORONARY ANGIOGRAM N/A 01/27/2013   Procedure: LEFT HEART CATHETERIZATION WITH CORONARY ANGIOGRAM;  Surgeon: Corky Crafts, MD;  Location: [facility] CATH LAB;  Service: Cardiovascular;  Laterality: N/A;  . LEFT HEART CATHETERIZATION WITH CORONARY ANGIOGRAM N/A 11/10/2014   Procedure: LEFT HEART CATHETERIZATION WITH CORONARY ANGIOGRAM;  Surgeon: Lennette Bihari, MD; left main okay, LAD 75% ISR, FFR 0.75, s/p Angiosculpt scoring balloon and 2.7518 mm Resolute DES, CFX 70%, stent patent, RCA 50%  . TONSILLECTOMY AND ADENOIDECTOMY  ~ 1952    Current Medications: Outpatient Medications Prior to Visit  Medication Sig Dispense Refill  . acetaminophen (TYLENOL) 500 MG tablet Take 500 mg by mouth daily as needed (pain).    [name] albuterol (PROVENTIL HFA;VENTOLIN HFA) 108 (90 Base) MCG/ACT inhaler Inhale 2 puffs into  the lungs as directed.    [name]. ALPRAZolam (XANAX) 0.25 MG tablet Take 0.25 mg by mouth 2 (two) times daily as needed for anxiety. anxiety    . AMBULATORY NON FORMULARY MEDICATION Inject 300 mg into the skin as directed. Medication Name: Inclisiran sodium vs placebo    . aspirin EC 81 MG tablet Take 81 mg by mouth daily.      . clopidogrel (PLAVIX) 75 MG tablet take 1 tablet by mouth once daily 30 tablet 1  . Diphenhydramine-APAP, sleep, (TYLENOL PM EXTRA STRENGTH PO) Take 1 tablet by  mouth at bedtime as needed. For sleep    . fluticasone (FLONASE) 50 MCG/ACT nasal spray Place 2 sprays into both nostrils daily. 1 g 2  . HYDROcodone-acetaminophen (NORCO/VICODIN) 5-325 MG per tablet Take 0.5-1 tablets by mouth every 6 (six) hours as needed for moderate pain. 20 tablet 0  . lidocaine (LIDODERM) 5 % Place 1 patch onto the skin daily as needed (pain).     [name]. loratadine (CLARITIN) 10 MG tablet Take 10 mg by mouth daily. Reported on 12/29/2015    . LYRICA 75 MG capsule Take 75 mg by mouth at bedtime.     . metoprolol tartrate (LOPRESSOR) 25 MG tablet take 1 tablet by mouth twice a day 60 tablet 1  . nitroGLYCERIN (NITROSTAT) 0.4 MG SL tablet Place 1 tablet (0.4 mg total) under the tongue every 5 (five) minutes as needed for chest pain (CP or SOB). 25 tablet 2  . oxyCODONE-acetaminophen (PERCOCET) 5-325 MG tablet Take 1 tablet by mouth every 4 (four) hours as needed for moderate pain. 15 tablet 0  . pantoprazole (PROTONIX) 40 MG tablet take 1 tablet by mouth once daily AT 6 AM 30 tablet 8  . potassium chloride SA (K-DUR,KLOR-CON) 20 MEQ tablet take 1 tablet by mouth once daily 90 tablet 1  . Probiotic Product (ALIGN PO) Take 1 tablet by mouth daily.    [name]. torsemide (DEMADEX) 20 MG tablet Take 20 mg by mouth daily. Take 1 to 2 tablets by mouth daily as needed for swelling.    . valsartan-hydrochlorothiazide (DIOVAN-HCT) 160-12.5 MG per tablet Take 1 tablet by mouth daily.     . Vitamin D, Ergocalciferol, (DRISDOL) 50000 UNITS CAPS capsule Take 50,000 Units by mouth every 7 (seven) days. Take on Mondays    . zolpidem (AMBIEN) 10 MG tablet Take 5 mg by mouth daily as needed for sleep. Reported on 12/29/2015     No facility-administered medications prior to visit.      Allergies:   Adhesive [tape]; Crestor [rosuvastatin calcium]; Statins; Codeine; and Elavil [amitriptyline]   Social History   Social History  . Marital status: Married    Spouse name: N/A  . Number of children: 3  .  Years of education: masters   Occupational History  . retired    Social History Main Topics  . Smoking status: Former Smoker    Packs/day: 0.12    Years: 36.00    Types: Cigarettes    Quit date: 12/02/1991  . Smokeless tobacco: Never Used  . Alcohol use No  . Drug use: No  . Sexual activity: Not Currently    Birth control/ protection: Surgical   Other Topics Concern  . Not on file   Social History Narrative  . No narrative on file     Family History:  The patient's ***family history includes Heart attack in her father; Stroke in her mother.   ROS:   Please see the  history of present illness.    ***  All other systems reviewed and are negative.   PHYSICAL EXAM:   VS:  There were no vitals taken for this visit.   GEN: Well nourished, well developed, in no acute distress  HEENT: normal  Neck: no JVD, carotid bruits, or masses Cardiac: ***RRR; no murmurs, rubs, or gallops,no edema  Respiratory:  clear to auscultation bilaterally, normal work of breathing GI: soft, nontender, nondistended, + BS MS: no deformity or atrophy  Skin: warm and dry, no rash Neuro:  Alert and Oriented x 3, Strength and sensation are intact Psych: euthymic mood, full affect  Wt Readings from Last 3 Encounters:  01/14/17 217 lb (98.4 kg)  01/13/17 218 lb (98.9 kg)  01/08/17 210 lb (95.3 kg)      Studies/Labs Reviewed:   EKG:  EKG  ***  Recent Labs: 04/30/2016: TSH 2.70 01/08/2017: ALT 17; BUN 22; Creatinine, Ser 1.65; Hemoglobin 11.0; Platelets 203; Potassium 3.7; Sodium 140   Lipid Panel    Component Value Date/Time   CHOL 271 (H) 12/30/2015 0000   TRIG 90 12/30/2015 0000   HDL 49 12/30/2015 0000   CHOLHDL 5.5 12/30/2015 0000   VLDL 18 12/30/2015 0000   LDLCALC 204 (H) 12/30/2015 0000    Additional studies/ records that were reviewed today include:  ***    ASSESSMENT:    1. Paroxysmal atrial fibrillation (HCC)   2. Chronic diastolic CHF (congestive heart failure) (HCC)     3. Coronary artery disease involving native coronary artery of native heart with angina pectoris (HCC)   4. Essential hypertension      PLAN:  In order of problems listed above:  1. ***    Medication Adjustments/Labs and Tests Ordered: Current medicines are reviewed at length with the patient today.  Concerns regarding medicines are outlined above.  Medication changes, Labs and Tests ordered today are listed in the Patient Instructions below. There are no Patient Instructions on file for this visit.   Signed, Catherine Noe, MD  02/13/2017 4:20 PM    [facility] [address] [place], [place], [place]  [address] Phone: [phone]; Fax: [phone]

## 2017-02-14 ENCOUNTER — Ambulatory Visit: Payer: BC Managed Care – PPO | Admitting: Interventional Cardiology

## 2017-02-21 ENCOUNTER — Telehealth: Payer: Self-pay | Admitting: *Deleted

## 2017-02-21 NOTE — Telephone Encounter (Signed)
I spoke with patient today and she states she is doing well and has no mylagias. She stopped her Crestor in May and will continue [facility]. She missed her 90 day injection due to mylagias. I talked to the sponsor and they are allowing her to take 90 day injection even though out of window for visit. I have scheduled her on Feb 25, 2017 at 9:30am for [facility].

## 2017-02-25 NOTE — Progress Notes (Signed)
Cardiology Office Note    Date:  02/26/2017   ID:  Nikky, Duba [birth year], MRN [id number]  PCP:  Andi Devon, MD  Cardiologist: Lesleigh Noe, MD   Chief Complaint  Patient presents with  . Coronary Artery Disease  . Congestive Heart Failure    History of Present Illness:  [name] is a 81 y.o. female with a h/o CAD (s/p DES to LAD 2006, DES to Cx 2014, angiosculpt scoring balloon/DES to LAD 2016, DES to RCA 12/2015), chronic diastolic CHF, HTN, atrial fib/flutter, hyperlipidemia, pre-diabetes, chronic LEE (L>R), fibromyalgia, anxiety, remote h/o stomach ulcers.  Having some mild dyspnea on exertion but no chest discomfort. Complains of some mild lower extremity swelling. No orthopnea, PND, or chest pain. Crestor was discontinued because of leg pain. She continues on the Orion trial. Discomfort in her legs feels better than previous.  Past Medical History:  Diagnosis Date  . Anxiety   . Arthritis    Multiple joints  . Chronic diastolic CHF (congestive heart failure) (HCC) 01/16/2016  . Coronary artery disease    a. s/p DES to LAD 2006. b. DES to Cx 2014. c. angiosculpt scoring balloon/DES to LAD 2016. d. DES to RCA 12/2015.  . Fibromyalgia   . GERD (gastroesophageal reflux disease)   . High cholesterol   . History of stomach ulcers    Remotely  . Hypertension   . Lower extremity edema    a. h/o chronic edema L>R.  . Osteoporosis   . PAF (paroxysmal atrial fibrillation) (HCC)    a. 2012 - H/P says atrial flutter, D/c summary says atrial fib - was tx with IV amiodarone, only had a few hours of arrhythmias, no clinical recurrence as of 2017.  [name] Ventricular hypertrophy   . Vertigo 08/27/2006    Past Surgical History:  Procedure Laterality Date  . ABDOMINAL HYSTERECTOMY  1970's  . BREAST BIOPSY Right 1970's  . BREAST LUMPECTOMY Right 1970's   benign   . CARDIAC CATHETERIZATION  10/2006   [name] 08/03/2007 (01/27/2013)  . CARDIAC  CATHETERIZATION N/A 01/01/2016   Procedure: Left Heart Cath and Coronary Angiography;  Surgeon: Kathleene Hazel, MD;  Location: [facility] INVASIVE CV LAB;  Service: Cardiovascular;  Laterality: N/A;  . CARDIAC CATHETERIZATION N/A 01/01/2016   Procedure: Coronary Stent Intervention;  Surgeon: Kathleene Hazel, MD;  Location: [facility] INVASIVE CV LAB;  Service: Cardiovascular;  Laterality: N/A;  . CORONARY ANGIOPLASTY WITH STENT PLACEMENT  06/2005; 01/27/2013    2.515 mm Cypher stent to LAD in 2006; 2.75 x 12 Promus DES to the circumflex 2014    . DIAGNOSTIC LAPAROSCOPY  2000   lap abdominal lysis of adhesions  . ESOPHAGOGASTRODUODENOSCOPY N/A 11/10/2014   Procedure: ESOPHAGOGASTRODUODENOSCOPY (EGD);  Surgeon: Jeani Hawking, MD;  Location: [facility] ENDOSCOPY;  Service: Endoscopy;  Laterality: N/A;  . HERNIA REPAIR    . LAPAROSCOPIC SALPINGO OOPHERECTOMY  2000  . LAPAROTOMY     "day after oophorectomy; had bowel obstruction" (01/27/2013)  . LEFT HEART CATHETERIZATION WITH CORONARY ANGIOGRAM N/A 01/27/2013   Procedure: LEFT HEART CATHETERIZATION WITH CORONARY ANGIOGRAM;  Surgeon: Corky Crafts, MD;  Location: [facility] CATH LAB;  Service: Cardiovascular;  Laterality: N/A;  . LEFT HEART CATHETERIZATION WITH CORONARY ANGIOGRAM N/A 11/10/2014   Procedure: LEFT HEART CATHETERIZATION WITH CORONARY ANGIOGRAM;  Surgeon: Lennette Bihari, MD; left main okay, LAD 75% ISR, FFR 0.75, s/p Angiosculpt scoring balloon and 2.7518 mm Resolute DES, CFX 70%, stent patent, RCA 50%  .  TONSILLECTOMY AND ADENOIDECTOMY  ~ 1952    Current Medications: Outpatient Medications Prior to Visit  Medication Sig Dispense Refill  . acetaminophen (TYLENOL) 500 MG tablet Take 500 mg by mouth daily as needed (pain).    [name] albuterol (PROVENTIL HFA;VENTOLIN HFA) 108 (90 Base) MCG/ACT inhaler Inhale 2 puffs into the lungs as directed.    [name] ALPRAZolam (XANAX) 0.25 MG tablet Take 0.25 mg by mouth 2 (two) times daily as needed for anxiety. anxiety    .  AMBULATORY NON FORMULARY MEDICATION Inject 300 mg into the skin as directed. Medication Name: Inclisiran sodium vs placebo    . aspirin EC 81 MG tablet Take 81 mg by mouth daily.      . clopidogrel (PLAVIX) 75 MG tablet take 1 tablet by mouth once daily 30 tablet 1  . Diphenhydramine-APAP, sleep, (TYLENOL PM EXTRA STRENGTH PO) Take 1 tablet by mouth at bedtime as needed. For sleep    . fluticasone (FLONASE) 50 MCG/ACT nasal spray Place 2 sprays into both nostrils daily. 1 g 2  . HYDROcodone-acetaminophen (NORCO/VICODIN) 5-325 MG per tablet Take 0.5-1 tablets by mouth every 6 (six) hours as needed for moderate pain. 20 tablet 0  . lidocaine (LIDODERM) 5 % Place 1 patch onto the skin daily as needed (pain).     [name] loratadine (CLARITIN) 10 MG tablet Take 10 mg by mouth daily. Reported on 12/29/2015    . LYRICA 75 MG capsule Take 75 mg by mouth at bedtime.     . metoprolol tartrate (LOPRESSOR) 25 MG tablet take 1 tablet by mouth twice a day 60 tablet 1  . nitroGLYCERIN (NITROSTAT) 0.4 MG SL tablet Place 1 tablet (0.4 mg total) under the tongue every 5 (five) minutes as needed for chest pain (CP or SOB). 25 tablet 2  . oxyCODONE-acetaminophen (PERCOCET) 5-325 MG tablet Take 1 tablet by mouth every 4 (four) hours as needed for moderate pain. 15 tablet 0  . pantoprazole (PROTONIX) 40 MG tablet take 1 tablet by mouth once daily AT 6 AM 30 tablet 8  . potassium chloride SA (K-DUR,KLOR-CON) 20 MEQ tablet take 1 tablet by mouth once daily 90 tablet 1  . Probiotic Product (ALIGN PO) Take 1 tablet by mouth daily.    [name] torsemide (DEMADEX) 20 MG tablet Take 20 mg by mouth daily. Take 1 to 2 tablets by mouth daily as needed for swelling.    . valsartan-hydrochlorothiazide (DIOVAN-HCT) 160-12.5 MG per tablet Take 1 tablet by mouth daily.     . Vitamin D, Ergocalciferol, (DRISDOL) 50000 UNITS CAPS capsule Take 50,000 Units by mouth every 7 (seven) days. Take on Mondays    . zolpidem (AMBIEN) 10 MG tablet Take 5 mg by  mouth daily as needed for sleep. Reported on 12/29/2015     No facility-administered medications prior to visit.      Allergies:   Adhesive [tape]; Crestor [rosuvastatin calcium]; Statins; Codeine; and Elavil [amitriptyline]   Social History   Social History  . Marital status: Married    Spouse name: N/A  . Number of children: 3  . Years of education: masters   Occupational History  . retired    Social History Main Topics  . Smoking status: Former Smoker    Packs/day: 0.12    Years: 36.00    Types: Cigarettes    Quit date: 12/02/1991  . Smokeless tobacco: Never Used  . Alcohol use No  . Drug use: No  . Sexual activity: Not Currently  Birth control/ protection: Surgical   Other Topics Concern  . None   Social History Narrative  . None     Family History:  The patient's family history includes Heart attack in her father; Stroke in her mother.   ROS:   Please see the history of present illness.    Ankle swelling, shortness of breath with activity, leg pain, muscle pain. Leg pain and muscle pain is improving off Crestor. Recent laboratory data  All other systems reviewed and are negative.   PHYSICAL EXAM:   VS:  BP 114/72 (BP Location: Right Arm)   Pulse 75   Ht 5\' 7"  (1.702 m)   Wt 217 lb 1.9 oz (98.5 kg)   BMI 34.01 kg/m    GEN: Well nourished, well developed, in no acute distress  HEENT: normal  Neck: no JVD, carotid bruits, or masses Cardiac: RRR; no murmurs, rubs, or gallops,no edema  Respiratory:  clear to auscultation bilaterally, normal work of breathing GI: soft, nontender, nondistended, + BS MS: no deformity or atrophy  Skin: warm and dry, no rash Neuro:  Alert and Oriented x 3, Strength and sensation are intact Psych: euthymic mood, full affect  Wt Readings from Last 3 Encounters:  02/26/17 217 lb 1.9 oz (98.5 kg)  01/14/17 217 lb (98.4 kg)  01/13/17 218 lb (98.9 kg)      Studies/Labs Reviewed:   EKG:  EKG  Not repeated  Recent  Labs: 04/30/2016: TSH 2.70 01/08/2017: ALT 17; BUN 22; Creatinine, Ser 1.65; Hemoglobin 11.0; Platelets 203; Potassium 3.7; Sodium 140   Lipid Panel    Component Value Date/Time   CHOL 271 (H) 12/30/2015 0000   TRIG 90 12/30/2015 0000   HDL 49 12/30/2015 0000   CHOLHDL 5.5 12/30/2015 0000   VLDL 18 12/30/2015 0000   LDLCALC 204 (H) 12/30/2015 0000    Additional studies/ records that were reviewed today include:  Cardiac catheterization May 2017: Coronary Diagrams   Diagnostic Diagram       Post-Intervention Diagram           ASSESSMENT:    1. Coronary artery disease involving native coronary artery of native heart with angina pectoris (HCC)   2. Chronic diastolic CHF (congestive heart failure) (HCC)   3. Essential hypertension   4. Old MI (myocardial infarction)      PLAN:  In order of problems listed above:  1. Stable without angina 2. No real evidence of volume overload. Last blood work revealed mild evidence of dehydration with creatinine 1.65. No further increase in diuretic therapy is felt to be indicated. 3. Blood pressures under excellent control.  Overall encouraged patient to increase physical activity. No blood oh work or functional testing is indicated at this time. 6-9 month clinical follow-up.    Medication Adjustments/Labs and Tests Ordered: Current medicines are reviewed at length with the patient today.  Concerns regarding medicines are outlined above.  Medication changes, Labs and Tests ordered today are listed in the Patient Instructions below. Patient Instructions  Medication Instructions:  None  Labwork: None  Testing/Procedures: None  Follow-Up: Your physician wants you to follow-up in: 6-8 months with Dr.Echo Allsbrook. You will receive a reminder letter in the mail two months in advance. If you don't receive a letter, please call our office to schedule the follow-up appointment.   Any Other Special Instructions Will Be Listed Below (If  Applicable).  Your physician discussed the importance of regular exercise and recommended that you start or continue a regular exercise  program for good health.  Make sure to watch the amount of salt in your diet.  Keep it 2,000mg  or less.     If you need a refill on your cardiac medications before your next appointment, please call your pharmacy.      Signed, Lesleigh NoeHenry W Taralee Marcus III, MD  02/26/2017 2:53 PM    [facility] [address], [place], [place]  [address] Phone: [phone]; Fax: [phone]

## 2017-02-26 ENCOUNTER — Encounter: Payer: Self-pay | Admitting: Interventional Cardiology

## 2017-02-26 ENCOUNTER — Ambulatory Visit (INDEPENDENT_AMBULATORY_CARE_PROVIDER_SITE_OTHER): Payer: Medicare PPO | Admitting: Interventional Cardiology

## 2017-02-26 VITALS — BP 114/72 | HR 75 | Ht 67.0 in | Wt 217.1 lb

## 2017-02-26 DIAGNOSIS — I5032 Chronic diastolic (congestive) heart failure: Secondary | ICD-10-CM | POA: Diagnosis not present

## 2017-02-26 DIAGNOSIS — I25119 Atherosclerotic heart disease of native coronary artery with unspecified angina pectoris: Secondary | ICD-10-CM

## 2017-02-26 DIAGNOSIS — I252 Old myocardial infarction: Secondary | ICD-10-CM | POA: Diagnosis not present

## 2017-02-26 DIAGNOSIS — I10 Essential (primary) hypertension: Secondary | ICD-10-CM

## 2017-02-26 NOTE — Patient Instructions (Signed)
Medication Instructions:  None  Labwork: None  Testing/Procedures: None  Follow-Up: Your physician wants you to follow-up in: 6-8 months with Dr.Smith. You will receive a reminder letter in the mail two months in advance. If you don't receive a letter, please call our office to schedule the follow-up appointment.   Any Other Special Instructions Will Be Listed Below (If Applicable).  Your physician discussed the importance of regular exercise and recommended that you start or continue a regular exercise program for good health.  Make sure to watch the amount of salt in your diet.  Keep it 2,000mg  or less.     If you need a refill on your cardiac medications before your next appointment, please call your pharmacy.

## 2017-03-17 ENCOUNTER — Encounter: Payer: Self-pay | Admitting: *Deleted

## 2017-03-17 DIAGNOSIS — Z006 Encounter for examination for normal comparison and control in clinical research program: Secondary | ICD-10-CM

## 2017-03-17 NOTE — Progress Notes (Signed)
Subject to Research clinic for day 150 in the ORION trial.  No c/o, aes or saes to report.  Next appointment scheduled.

## 2017-04-06 ENCOUNTER — Emergency Department (HOSPITAL_BASED_OUTPATIENT_CLINIC_OR_DEPARTMENT_OTHER): Admit: 2017-04-06 | Discharge: 2017-04-06 | Disposition: A | Discharge status: Home / Self Care | Payer: Medicare PPO

## 2017-04-06 ENCOUNTER — Emergency Department (HOSPITAL_COMMUNITY): Payer: Medicare PPO

## 2017-04-06 ENCOUNTER — Encounter (HOSPITAL_COMMUNITY): Payer: Self-pay | Admitting: *Deleted

## 2017-04-06 ENCOUNTER — Observation Stay (HOSPITAL_COMMUNITY)
Admission: EM | Admit: 2017-04-06 | Discharge: 2017-04-08 | Disposition: A | Discharge status: Home / Self Care | Payer: Medicare PPO | Attending: Family Medicine | Admitting: Family Medicine

## 2017-04-06 DIAGNOSIS — Z955 Presence of coronary angioplasty implant and graft: Secondary | ICD-10-CM | POA: Diagnosis not present

## 2017-04-06 DIAGNOSIS — I4892 Unspecified atrial flutter: Secondary | ICD-10-CM | POA: Diagnosis not present

## 2017-04-06 DIAGNOSIS — M797 Fibromyalgia: Secondary | ICD-10-CM | POA: Insufficient documentation

## 2017-04-06 DIAGNOSIS — I252 Old myocardial infarction: Secondary | ICD-10-CM | POA: Insufficient documentation

## 2017-04-06 DIAGNOSIS — Z8719 Personal history of other diseases of the digestive system: Secondary | ICD-10-CM | POA: Diagnosis not present

## 2017-04-06 DIAGNOSIS — G629 Polyneuropathy, unspecified: Secondary | ICD-10-CM | POA: Insufficient documentation

## 2017-04-06 DIAGNOSIS — I5032 Chronic diastolic (congestive) heart failure: Secondary | ICD-10-CM | POA: Insufficient documentation

## 2017-04-06 DIAGNOSIS — I10 Essential (primary) hypertension: Secondary | ICD-10-CM | POA: Diagnosis present

## 2017-04-06 DIAGNOSIS — Z7982 Long term (current) use of aspirin: Secondary | ICD-10-CM | POA: Diagnosis not present

## 2017-04-06 DIAGNOSIS — E876 Hypokalemia: Secondary | ICD-10-CM | POA: Diagnosis not present

## 2017-04-06 DIAGNOSIS — I25119 Atherosclerotic heart disease of native coronary artery with unspecified angina pectoris: Secondary | ICD-10-CM | POA: Diagnosis not present

## 2017-04-06 DIAGNOSIS — F419 Anxiety disorder, unspecified: Secondary | ICD-10-CM | POA: Diagnosis not present

## 2017-04-06 DIAGNOSIS — Z66 Do not resuscitate: Secondary | ICD-10-CM | POA: Insufficient documentation

## 2017-04-06 DIAGNOSIS — I4891 Unspecified atrial fibrillation: Secondary | ICD-10-CM | POA: Diagnosis present

## 2017-04-06 DIAGNOSIS — E7800 Pure hypercholesterolemia, unspecified: Secondary | ICD-10-CM | POA: Diagnosis not present

## 2017-04-06 DIAGNOSIS — I480 Paroxysmal atrial fibrillation: Secondary | ICD-10-CM | POA: Diagnosis not present

## 2017-04-06 DIAGNOSIS — M79605 Pain in left leg: Secondary | ICD-10-CM | POA: Insufficient documentation

## 2017-04-06 DIAGNOSIS — Z87891 Personal history of nicotine dependence: Secondary | ICD-10-CM | POA: Diagnosis not present

## 2017-04-06 DIAGNOSIS — N183 Chronic kidney disease, stage 3 unspecified: Secondary | ICD-10-CM | POA: Diagnosis present

## 2017-04-06 DIAGNOSIS — M810 Age-related osteoporosis without current pathological fracture: Secondary | ICD-10-CM | POA: Diagnosis not present

## 2017-04-06 DIAGNOSIS — I130 Hypertensive heart and chronic kidney disease with heart failure and stage 1 through stage 4 chronic kidney disease, or unspecified chronic kidney disease: Secondary | ICD-10-CM | POA: Insufficient documentation

## 2017-04-06 DIAGNOSIS — M1990 Unspecified osteoarthritis, unspecified site: Secondary | ICD-10-CM | POA: Diagnosis not present

## 2017-04-06 DIAGNOSIS — K219 Gastro-esophageal reflux disease without esophagitis: Secondary | ICD-10-CM | POA: Insufficient documentation

## 2017-04-06 DIAGNOSIS — R609 Edema, unspecified: Secondary | ICD-10-CM | POA: Diagnosis not present

## 2017-04-06 DIAGNOSIS — I700 Atherosclerosis of aorta: Secondary | ICD-10-CM | POA: Diagnosis not present

## 2017-04-06 DIAGNOSIS — H8110 Benign paroxysmal vertigo, unspecified ear: Secondary | ICD-10-CM | POA: Diagnosis not present

## 2017-04-06 DIAGNOSIS — R739 Hyperglycemia, unspecified: Secondary | ICD-10-CM | POA: Diagnosis not present

## 2017-04-06 DIAGNOSIS — Z7951 Long term (current) use of inhaled steroids: Secondary | ICD-10-CM | POA: Insufficient documentation

## 2017-04-06 DIAGNOSIS — R079 Chest pain, unspecified: Secondary | ICD-10-CM | POA: Diagnosis not present

## 2017-04-06 DIAGNOSIS — Z7902 Long term (current) use of antithrombotics/antiplatelets: Secondary | ICD-10-CM | POA: Insufficient documentation

## 2017-04-06 HISTORY — DX: Polyneuropathy, unspecified: G62.9

## 2017-04-06 LAB — CBC
HEMATOCRIT: 35 % — AB (ref 36.0–46.0)
HEMOGLOBIN: 11.6 g/dL — AB (ref 12.0–15.0)
MCH: 27.7 pg (ref 26.0–34.0)
MCHC: 33.1 g/dL (ref 30.0–36.0)
MCV: 83.5 fL (ref 78.0–100.0)
Platelets: 244 10*3/uL (ref 150–400)
RBC: 4.19 MIL/uL (ref 3.87–5.11)
RDW: 15.2 % (ref 11.5–15.5)
WBC: 6.2 10*3/uL (ref 4.0–10.5)

## 2017-04-06 LAB — I-STAT TROPONIN, ED: Troponin i, poc: 0 ng/mL (ref 0.00–0.08)

## 2017-04-06 LAB — BRAIN NATRIURETIC PEPTIDE: B NATRIURETIC PEPTIDE 5: 84.7 pg/mL (ref 0.0–100.0)

## 2017-04-06 LAB — BASIC METABOLIC PANEL
ANION GAP: 13 (ref 5–15)
BUN: 22 mg/dL — ABNORMAL HIGH (ref 6–20)
CO2: 25 mmol/L (ref 22–32)
Calcium: 9.2 mg/dL (ref 8.9–10.3)
Chloride: 101 mmol/L (ref 101–111)
Creatinine, Ser: 1.26 mg/dL — ABNORMAL HIGH (ref 0.44–1.00)
GFR calc non Af Amer: 40 mL/min — ABNORMAL LOW (ref >60)
GFR, EST AFRICAN AMERICAN: 46 mL/min — AB (ref >60)
GLUCOSE: 122 mg/dL — AB (ref 65–99)
POTASSIUM: 3.2 mmol/L — AB (ref 3.5–5.1)
Sodium: 139 mmol/L (ref 135–145)

## 2017-04-06 LAB — D-DIMER, QUANTITATIVE: D-Dimer, Quant: 0.39 ug/mL-FEU (ref 0.00–0.50)

## 2017-04-06 MED ORDER — MORPHINE SULFATE (PF) 4 MG/ML IV SOLN
2.0000 mg | INTRAVENOUS | Status: DC | PRN
  Administered 2017-04-06: 2 mg via INTRAVENOUS
  Filled 2017-04-06: qty 1

## 2017-04-06 MED ORDER — MORPHINE SULFATE (PF) 4 MG/ML IV SOLN
4.0000 mg | Freq: Once | INTRAVENOUS | Status: AC
  Administered 2017-04-06: 4 mg via INTRAVENOUS
  Filled 2017-04-06: qty 1

## 2017-04-06 MED ORDER — ASPIRIN 81 MG PO CHEW
162.0000 mg | CHEWABLE_TABLET | Freq: Once | ORAL | Status: AC
  Administered 2017-04-06: 162 mg via ORAL
  Filled 2017-04-06: qty 2

## 2017-04-06 MED ORDER — ACETAMINOPHEN 325 MG PO TABS
650.0000 mg | ORAL_TABLET | ORAL | Status: DC | PRN
  Administered 2017-04-06 – 2017-04-07 (×2): 650 mg via ORAL
  Filled 2017-04-06 (×2): qty 2

## 2017-04-06 MED ORDER — GI COCKTAIL ~~LOC~~
30.0000 mL | Freq: Four times a day (QID) | ORAL | Status: DC | PRN
  Administered 2017-04-06: 30 mL via ORAL
  Filled 2017-04-06: qty 30

## 2017-04-06 NOTE — ED Notes (Signed)
US at bedside

## 2017-04-06 NOTE — Progress Notes (Addendum)
VASCULAR LAB PRELIMINARY  PRELIMINARY  PRELIMINARY  PRELIMINARY  Left lower extremity venous duplex completed.    Preliminary report:  There is no DVT or SVT noted in the left lower extremity.  Gave results to Dr. Gertha CalkinLiu  Jaquila Santelli, [facility] CenterCANDACE, RVT 04/06/2017, 6:17 PM

## 2017-04-06 NOTE — ED Notes (Signed)
Patient transported to X-ray 

## 2017-04-06 NOTE — ED Provider Notes (Signed)
[facility]-EMERGENCY DEPT Provider Note   CSN: [id number] Arrival date & time: 04/06/17  1559     History   Chief Complaint Chief Complaint  Patient presents with  . Chest Pain  . Shortness of Breath    HPI [name] is a 81 y.o. female.  The history is provided by the patient.  Chest Pain   This is a new problem. The current episode started more than 2 days ago. The problem occurs constantly. The problem has not changed since onset.The pain is associated with movement, coughing, exertion and breathing. The pain is present in the substernal region. The pain is moderate. The quality of the pain is described as dull. The pain radiates to the upper back. Associated symptoms include cough, exertional chest pressure, lower extremity edema, malaise/fatigue, nausea, PND, shortness of breath and weakness. Pertinent negatives include no abdominal pain, no fever, no orthopnea and no vomiting. Treatments tried: aspirin and tylenol. The treatment provided no relief. Risk factors include being elderly, sedentary lifestyle and obesity.  Her past medical history is significant for CAD, CHF, hyperlipidemia and hypertension.  Procedure history is positive for cardiac catheterization and stress thallium.   81 year old female who presents with chest pain and shortness of breath. She has extensive cardiac history including multiple stents, chronic diastolic heart failure, hypertension, hyperlipidemia, and paroxysmal atrial fibrillation. She is on Plavix. Reports gradual onset of right-sided chest pain that radiates to the upper back and behind the right shoulder starting 3 days ago. States that pain is dull and associated with shortness of breath. Radiates to the right jaw. States that with simple activity at home she has become more fatigued, winded and short of breath with nausea and occasional lightheadedness and diaphoresis. This that she also has a separate component of pain that is sharp worse with  movement and breathing. Has had cough, congestion but no fevers or chills. Has also had left-sided leg swelling. Over the past few nights has felt that she may be wakes up in the middle the night feeling short of breath.   Past Medical History:  Diagnosis Date  . Anxiety   . Arthritis    Multiple joints  . Chronic diastolic CHF (congestive heart failure) (HCC) 01/16/2016  . Coronary artery disease    a. s/p DES to LAD 2006. b. DES to Cx 2014. c. angiosculpt scoring balloon/DES to LAD 2016. d. DES to RCA 12/2015.  . Fibromyalgia   . GERD (gastroesophageal reflux disease)   . High cholesterol   . History of stomach ulcers    Remotely  . Hypertension   . Lower extremity edema    a. h/o chronic edema L>R.  . Osteoporosis   . PAF (paroxysmal atrial fibrillation) (HCC)    a. 2012 - H/P says atrial flutter, D/c summary says atrial fib - was tx with IV amiodarone, only had a few hours of arrhythmias, no clinical recurrence as of 2017.  [name]. Ventricular hypertrophy   . Vertigo 08/27/2006    Patient Active Problem List   Diagnosis Date Noted  . Primary osteoarthritis of both knees 11/27/2016  . Osteoarthritis of pelvis 11/27/2016  . High cholesterol 11/27/2016  . Fibromyalgia syndrome 11/27/2016  . Other fatigue 11/27/2016  . Other insomnia 11/27/2016  . Snoring 06/28/2016  . Chronic diastolic CHF (congestive heart failure) (HCC) 01/16/2016  . Schatzki's ring 11/11/2014  . Hiatal hernia 11/11/2014  . Essential hypertension   . CAP (community acquired pneumonia) 05/02/2014  . Facial numbness 03/24/2014  .  Dysuria 01/26/2014  . Atrophic vaginitis 01/26/2014  . Hyperlipidemia 07/23/2013  . Old MI (myocardial infarction)     Class: Chronic  . Nausea and vomiting 11/13/2011  . Vertigo, benign positional 07/07/2011  . Coronary artery disease involving native coronary artery of native heart with angina pectoris (HCC) 07/07/2011  . A-fib (HCC) 07/07/2011    Past Surgical History:    Procedure Laterality Date  . ABDOMINAL HYSTERECTOMY  1970's  . BREAST BIOPSY Right 1970's  . BREAST LUMPECTOMY Right 1970's   benign   . CARDIAC CATHETERIZATION  10/2006   [name] 08/03/2007 (01/27/2013)  . CARDIAC CATHETERIZATION N/A 01/01/2016   Procedure: Left Heart Cath and Coronary Angiography;  Surgeon: Kathleene Hazel, MD;  Location: [facility] INVASIVE CV LAB;  Service: Cardiovascular;  Laterality: N/A;  . CARDIAC CATHETERIZATION N/A 01/01/2016   Procedure: Coronary Stent Intervention;  Surgeon: Kathleene Hazel, MD;  Location: [facility] INVASIVE CV LAB;  Service: Cardiovascular;  Laterality: N/A;  . CORONARY ANGIOPLASTY WITH STENT PLACEMENT  06/2005; 01/27/2013    2.515 mm Cypher stent to LAD in 2006; 2.75 x 12 Promus DES to the circumflex 2014    . DIAGNOSTIC LAPAROSCOPY  2000   lap abdominal lysis of adhesions  . ESOPHAGOGASTRODUODENOSCOPY N/A 11/10/2014   Procedure: ESOPHAGOGASTRODUODENOSCOPY (EGD);  Surgeon: Jeani Hawking, MD;  Location: [facility] ENDOSCOPY;  Service: Endoscopy;  Laterality: N/A;  . HERNIA REPAIR    . LAPAROSCOPIC SALPINGO OOPHERECTOMY  2000  . LAPAROTOMY     "day after oophorectomy; had bowel obstruction" (01/27/2013)  . LEFT HEART CATHETERIZATION WITH CORONARY ANGIOGRAM N/A 01/27/2013   Procedure: LEFT HEART CATHETERIZATION WITH CORONARY ANGIOGRAM;  Surgeon: Corky Crafts, MD;  Location: [facility] CATH LAB;  Service: Cardiovascular;  Laterality: N/A;  . LEFT HEART CATHETERIZATION WITH CORONARY ANGIOGRAM N/A 11/10/2014   Procedure: LEFT HEART CATHETERIZATION WITH CORONARY ANGIOGRAM;  Surgeon: Lennette Bihari, MD; left main okay, LAD 75% ISR, FFR 0.75, s/p Angiosculpt scoring balloon and 2.7518 mm Resolute DES, CFX 70%, stent patent, RCA 50%  . TONSILLECTOMY AND ADENOIDECTOMY  ~ 1952    OB History    No data available       Home Medications    Prior to Admission medications   Medication Sig Start Date End Date Taking? Authorizing Provider  acetaminophen (TYLENOL) 500 MG  tablet Take 500 mg by mouth daily as needed (pain).   Yes [provider]  ALPRAZolam (XANAX) 0.25 MG tablet Take 0.25 mg by mouth 2 (two) times daily as needed for anxiety. anxiety   Yes [provider]  aspirin EC 81 MG tablet Take 81 mg by mouth daily.     Yes [provider]  clopidogrel (PLAVIX) 75 MG tablet take 1 tablet by mouth once daily 01/07/17  Yes Lyn Records, MD  Cyanocobalamin (B-12 PO) Take 1 tablet by mouth 3 (three) times a week.   Yes [provider]  Diclofenac Sodium (VOLTAREN EX) Apply 1 application topically as needed (Pain).   Yes [provider]  Diphenhydramine-APAP, sleep, (TYLENOL PM EXTRA STRENGTH PO) Take 0.5 tablets by mouth at bedtime as needed. For sleep   Yes [provider]  loratadine (CLARITIN) 10 MG tablet Take 10 mg by mouth daily. Reported on 12/29/2015   Yes [provider]  LYRICA 75 MG capsule Take 75 mg by mouth at bedtime.  10/10/14  Yes [provider]  metoprolol tartrate (LOPRESSOR) 25 MG tablet take 1 tablet by mouth twice a day 02/06/17  Yes  Lyn Records, MD  nitroGLYCERIN (NITROSTAT) 0.4 MG SL tablet Place 1 tablet (0.4 mg total) under the tongue every 5 (five) minutes as needed for chest pain (CP or SOB). 01/02/16  Yes Sharol Harness, Brittainy M, PA-C  pantoprazole (PROTONIX) 40 MG tablet take 1 tablet by mouth once daily AT 6 AM 11/19/16  Yes Robbie Lis M, PA-C  potassium chloride SA (K-DUR,KLOR-CON) 20 MEQ tablet take 1 tablet by mouth once daily 01/06/17  Yes Weaver, Scott T, PA-C  Probiotic Product (ALIGN PO) Take 1 tablet by mouth daily.   Yes [provider]  torsemide (DEMADEX) 20 MG tablet Take 20 mg by mouth daily. Take 1 to 2 tablets by mouth daily as needed for swelling.   Yes [provider]  trolamine salicylate (ASPERCREME) 10 % cream Apply 1 application topically as needed for muscle pain.   Yes [provider]  valsartan-hydrochlorothiazide  (DIOVAN-HCT) 160-12.5 MG per tablet Take 1 tablet by mouth daily.  07/05/13  Yes [provider]  Vitamin D, Ergocalciferol, (DRISDOL) 50000 UNITS CAPS capsule Take 50,000 Units by mouth every 7 (seven) days. Take on Mondays   Yes [provider]  albuterol (PROVENTIL HFA;VENTOLIN HFA) 108 (90 Base) MCG/ACT inhaler Inhale 2 puffs into the lungs as directed.    [provider]  AMBULATORY NON FORMULARY MEDICATION Inject 300 mg into the skin as directed. Medication Name: Inclisiran sodium vs placebo Patient not taking: Reported on 04/06/2017 10/18/16   [name], MD  fluticasone [facility]) 50 MCG/ACT nasal spray Place 2 sprays into both nostrils daily. 05/06/14   Esperanza Sheets, MD  HYDROcodone-acetaminophen (NORCO/VICODIN) 5-325 MG per tablet Take 0.5-1 tablets by mouth every 6 (six) hours as needed for moderate pain. Patient not taking: Reported on 04/06/2017 11/12/14   Osvaldo Shipper, MD  oxyCODONE-acetaminophen (PERCOCET) 5-325 MG tablet Take 1 tablet by mouth every 4 (four) hours as needed for moderate pain. Patient not taking: Reported on 04/06/2017 01/09/17   Dione Booze, MD    Family History Family History  Problem Relation Age of Onset  . Stroke Mother   . Heart attack Father     Social History Social History  Substance Use Topics  . Smoking status: Former Smoker    Packs/day: 0.12    Years: 36.00    Types: Cigarettes    Quit date: 12/02/1991  . Smokeless tobacco: Never Used  . Alcohol use No     Allergies   Adhesive [tape]; Crestor [rosuvastatin calcium]; Statins; Codeine; and Elavil [amitriptyline]   Review of Systems Review of Systems  Constitutional: Positive for malaise/fatigue. Negative for fever.  Respiratory: Positive for cough and shortness of breath.   Cardiovascular: Positive for chest pain and PND. Negative for orthopnea.  Gastrointestinal: Positive for nausea. Negative for abdominal pain and vomiting.  Neurological: Positive for  weakness.  All other systems reviewed and are negative.    Physical Exam Updated Vital Signs BP 123/81   Pulse 67   Temp 97.9 F (36.6 C) (Oral)   Resp 13   SpO2 97%   Physical Exam Physical Exam  Nursing note and vitals reviewed. Constitutional: Well developed, well nourished, elderly woman, non-toxic, and in no acute distress Head: Normocephalic and atraumatic.  Mouth/Throat: Oropharynx is clear and moist.  Neck: Normal range of motion. Neck supple.  Cardiovascular: Normal rate and regular rhythm.   Pulmonary/Chest: Effort normal and breath sounds normal.  Abdominal: Soft. There is no tenderness. There is no rebound and no guarding.  Musculoskeletal: Left > right lower extremity edema.   Neurological: Alert, no facial droop, fluent speech Skin: Skin is warm and dry.  Psychiatric: Cooperative   ED Treatments / Results  Labs (all labs ordered are listed, but only abnormal results are displayed) Labs Reviewed  BASIC METABOLIC PANEL - Abnormal; Notable for the following:       Result Value   Potassium 3.2 (*)    Glucose, Bld 122 (*)    BUN 22 (*)    Creatinine, Ser 1.26 (*)    GFR calc non Af Amer 40 (*)    GFR calc Af Amer 46 (*)    All other components within normal limits  CBC - Abnormal; Notable for the following:    Hemoglobin 11.6 (*)    HCT 35.0 (*)    All other components within normal limits  D-DIMER, QUANTITATIVE (NOT AT [facility])  BRAIN NATRIURETIC PEPTIDE  I-STAT TROPONIN, ED    EKG  EKG Interpretation  Date/Time:  Sunday April 06 2017 16:04:15 EDT Ventricular Rate:  74 PR Interval:  138 QRS Duration: 76 QT Interval:  392 QTC Calculation: 435 R Axis:   68 Text Interpretation:  Normal sinus rhythm Nonspecific T wave abnormality Abnormal ECG Similar to previous EKG  Confirmed by Liu, Dana ([id number]) on 04/06/2017 4:21:22 PM       Radiology Dg Chest 2 View  Result Date: 04/06/2017 CLINICAL DATA:  80 y/o  F; chest pain. EXAM: CHEST  2 VIEW  COMPARISON:  12/29/2015 chest radiograph FINDINGS: Stable normal cardiac silhouette. Aortic atherosclerosis with calcification. Clear lungs. No pleural effusion or pneumothorax. Mild multilevel degenerative changes of the thoracic spine. IMPRESSION: No active cardiopulmonary disease. Electronically Signed   By: Lance  Furusawa-Stratton M.D.   On: 04/06/2017 17:05    Procedures Procedures (including critical care time)  Medications Ordered in ED Medications  aspirin chewable tablet 162 mg (162 mg Oral Given 04/06/17 1739)  morphine 4 MG/ML injection 4 mg (4 mg Intravenous Given 04/06/17 1739)     Initial Impression / Assessment and Plan / ED Course  I have reviewed the triage vital signs and the nursing notes.  Pertinent labs & imaging results that were available during my care of the patient were reviewed by me and considered in my medical decision making (see chart for details).     80  year old female with extensive cardiac history who presents with chest pain and shortness of breath. Parts of her pain seems musculoskeletal and she has a component that is reproducible palpation around the sternum and while she is sitting up. She also describes an exertional portion of symptoms including dyspnea on exertion, fatigue diaphoresis, with dull chest pressure/ache that is slightly more concerning. Her initial EKG does not show acute ischemic changes. First troponin is normal. She is ruled out for PE and DVT with a normal d-dimer and a normal lower extremity venous ultrasound. Chest x-ray visualized shows no cardiopulmonary processes. Given that she is higher risk for ACS, discussed with Dr. Ophelia Charter who will admit for observation for ACS rule out.    Records Reviewed. Last [facility] 01/2016 copy and pasted below. With NM stress test in 07/2016 showing no inducible ischemia.   1st Diag lesion, 70% stenosed.  Prox LAD to Mid LAD lesion, 30% stenosed.  Dist LAD lesion, 10% stenosed. The lesion was previously  treated with a drug-eluting stent one to two years ago.  Ost Cx lesion, 60% stenosed.  LM lesion, 20% stenosed.  Mid RCA lesion, 40% stenosed.  Ost RCA to Prox RCA lesion, 50% stenosed.  Ost RCA lesion, 90% stenosed. Post intervention, there is a 0% residual stenosis.  The left ventricular systolic function is normal. 1. Triple vessel CAD with unstable angina. Patent stents mid LAD and Circumflex 2. Severe stenosis ostium RCA.  3. Normal LV systolic function.  4. Successful PTCA/DES x 1 ostium RCA   Final Clinical Impressions(s) / ED Diagnoses   Final diagnoses:  Nonspecific chest pain    New Prescriptions New Prescriptions   No medications on file     Lavera GuiseLiu, Dana Duo, MD 04/06/17 1901

## 2017-04-06 NOTE — ED Notes (Signed)
Admitting at bedside 

## 2017-04-06 NOTE — ED Notes (Signed)
Pt reports 7/10 CP despite ordered morphine administration. Pt unable to be transported to inpatient unit with CP. Dr. Ophelia CharterYates paged by this RN.

## 2017-04-06 NOTE — ED Triage Notes (Signed)
To ED for eval of sob and cp for the past 2 days. Hx of MI with same symptoms. Pt took 1 81mg  ASA this am. States she has a little pain currently on right side and sob

## 2017-04-06 NOTE — ED Notes (Signed)
Per Dr. Ophelia CharterYates, will change order to request placement on 3W. Dr. Ophelia CharterYates to order medication for pain control.

## 2017-04-06 NOTE — H&P (Signed)
History and Physical    [name] MWN:[id number] DOB: August 15, 1936 DOA: 04/06/2017  PCP: Andi Devon, MD Consultants:  Katrinka Blazing - cardiology; Devaschwar - rheumatology Patient coming from: Home - lives with husband; NOK: husband, [phone]  Chief Complaint: chest pain  HPI: [name] is a 81 y.o. female with medical history significant of PAF not on AC; HTN; HLD; CAD with last cath in 2017; chronic diastolic heart failure; and fibromyalgia presenting with SOB with chest pain.  Some left leg pain with swelling in foot and ankle, and hip pain.  Symptoms started Thursday night with SOB.  Just walking from room to another would tire her out and that is very unusual for her.  She was SOB during church today, unable to sing.  Had to sit in the car for 15 minutes in order to get her energy up to get into the house.  She went out yesterday and ended up sleeping in the car and had to gather her energy to get out of the garage.  Chest pain is right-sided, radiating into her right back and shoulder.  It is a mild ache, uncomfortable.  It comes and goes.  Comes on with activity.  Improves with relaxing.  Similar to previous cardiac pain but this time no radiation into the jaw.  She is able to lay flat and has actually been doing that a lot these last few days.  Swelling only in left foot/ankle.  No nausea.  Some diaphoresis when particularly fatigued.  Last cath was in 2017.  Nuclear test in 11/17 with no inducible ischemia.   ED Course: EKG without ischemia.  Negative troponin.  Normal D-dimer and DVT US.  CXR negative.  Review of Systems: As per HPI; otherwise review of systems reviewed and negative.   Ambulatory Status:  Ambulated with a walker in the past, but nothing until a couple of months ago and now keeps a cane with her for when she is out  Past Medical History:  Diagnosis Date  . Anxiety   . Arthritis    Multiple joints  . Chronic diastolic CHF (congestive heart  failure) (HCC) 01/16/2016  . Coronary artery disease    a. s/p DES to LAD 2006. b. DES to Cx 2014. c. angiosculpt scoring balloon/DES to LAD 2016. d. DES to RCA 12/2015.  . Fibromyalgia   . GERD (gastroesophageal reflux disease)   . High cholesterol   . History of stomach ulcers    Remotely  . Hypertension   . Lower extremity edema    a. h/o chronic edema L>R.  [name] Neuropathy   . Osteoporosis   . PAF (paroxysmal atrial fibrillation) (HCC)    a. 2012 - H/P says atrial flutter, D/c summary says atrial fib - was tx with IV amiodarone, only had a few hours of arrhythmias, no clinical recurrence as of 2017.  [name] Ventricular hypertrophy   . Vertigo 08/27/2006    Past Surgical History:  Procedure Laterality Date  . ABDOMINAL HYSTERECTOMY  1970's  . BREAST BIOPSY Right 1970's  . BREAST LUMPECTOMY Right 1970's   benign   . CARDIAC CATHETERIZATION  10/2006   [name] 08/03/2007 (01/27/2013)  . CARDIAC CATHETERIZATION N/A 01/01/2016   Procedure: Left Heart Cath and Coronary Angiography;  Surgeon: Kathleene Hazel, MD;  Location: [facility] INVASIVE CV LAB;  Service: Cardiovascular;  Laterality: N/A;  . CARDIAC CATHETERIZATION N/A 01/01/2016   Procedure: Coronary Stent Intervention;  Surgeon: Kathleene Hazel, MD;  Location: [facility] INVASIVE CV LAB;  Service: Cardiovascular;  Laterality: N/A;  . CORONARY ANGIOPLASTY WITH STENT PLACEMENT  06/2005; 01/27/2013    2.515 mm Cypher stent to LAD in 2006; 2.75 x 12 Promus DES to the circumflex 2014    . DIAGNOSTIC LAPAROSCOPY  2000   lap abdominal lysis of adhesions  . ESOPHAGOGASTRODUODENOSCOPY N/A 11/10/2014   Procedure: ESOPHAGOGASTRODUODENOSCOPY (EGD);  Surgeon: Jeani HawkingPatrick Hung, MD;  [facility];  Service: Endoscopy;  Laterality: N/A;  . HERNIA REPAIR    . LAPAROSCOPIC SALPINGO OOPHERECTOMY  2000  . LAPAROTOMY     "day after oophorectomy; had bowel obstruction" (01/27/2013)  . LEFT HEART CATHETERIZATION WITH CORONARY ANGIOGRAM N/A 01/27/2013    Procedure: LEFT HEART CATHETERIZATION WITH CORONARY ANGIOGRAM;  Surgeon: Corky CraftsJayadeep S Varanasi, MD;  [facility];  Service: Cardiovascular;  Laterality: N/A;  . LEFT HEART CATHETERIZATION WITH CORONARY ANGIOGRAM N/A 11/10/2014   Procedure: LEFT HEART CATHETERIZATION WITH CORONARY ANGIOGRAM;  Surgeon: Lennette Biharihomas A Kelly, MD; left main okay, LAD 75% ISR, FFR 0.75, s/p Angiosculpt scoring balloon and 2.7518 mm Resolute DES, CFX 70%, stent patent, RCA 50%  . TONSILLECTOMY AND ADENOIDECTOMY  ~ 501952    Social History   Social History  . Marital status: Married    Spouse name: N/A  . Number of children: 3  . Years of education: masters   Occupational History  . retired    Social History Main Topics  . Smoking status: Former Smoker    Packs/day: 0.12    Years: 36.00    Types: Cigarettes    Quit date: 12/02/1991  . Smokeless tobacco: Never Used  . Alcohol use No  . Drug use: No  . Sexual activity: Not Currently    Birth control/ protection: Surgical   Other Topics Concern  . Not on file   Social History Narrative  . No narrative on file    Allergies  Allergen Reactions  . Adhesive [Tape] Itching and Rash  . Crestor [Rosuvastatin Calcium]     Body myalgias on 10mg  twice weekly  . Statins     Atorvastatin 40mg , 20mg , and 10mg , rosuvastatin, simvastatin - myalgias; pitavastatin 2mg   "feels bad". Tolerating Crestor 10mg  4x/week as of 2017  . Codeine Itching    Tolerates low dose of norco  . Elavil [Amitriptyline] Other (See Comments)    Dissociation    Family History  Problem Relation Age of Onset  . Stroke Mother   . Heart attack Father     Prior to Admission medications   Medication Sig Start Date End Date Taking? Authorizing Provider  acetaminophen (TYLENOL) 500 MG tablet Take 500 mg by mouth daily as needed (pain).   Yes [provider]  ALPRAZolam (XANAX) 0.25 MG tablet Take 0.25 mg by mouth 2 (two) times daily as needed for anxiety. anxiety   Yes  [provider]  aspirin EC 81 MG tablet Take 81 mg by mouth daily.     Yes [provider]  clopidogrel (PLAVIX) 75 MG tablet take 1 tablet by mouth once daily 01/07/17  Yes Lyn RecordsSmith, Henry W, MD  Cyanocobalamin (B-12 PO) Take 1 tablet by mouth 3 (three) times a week.   Yes [provider]  Diclofenac Sodium (VOLTAREN EX) Apply 1 application topically as needed (Pain).   Yes [provider]  Diphenhydramine-APAP, sleep, (TYLENOL PM EXTRA STRENGTH PO) Take 0.5 tablets by mouth at bedtime as needed. For sleep   Yes [provider]  loratadine (CLARITIN) 10 MG tablet Take 10 mg by mouth  daily. Reported on 12/29/2015   Yes [provider]  LYRICA 75 MG capsule Take 75 mg by mouth at bedtime.  10/10/14  Yes [provider]  metoprolol tartrate (LOPRESSOR) 25 MG tablet take 1 tablet by mouth twice a day 02/06/17  Yes Lyn Records, MD  nitroGLYCERIN (NITROSTAT) 0.4 MG SL tablet Place 1 tablet (0.4 mg total) under the tongue every 5 (five) minutes as needed for chest pain (CP or SOB). 01/02/16  Yes Sharol Harness, Brittainy M, PA-C  pantoprazole (PROTONIX) 40 MG tablet take 1 tablet by mouth once daily AT 6 AM 11/19/16  Yes Robbie Lis M, PA-C  potassium chloride SA (K-DUR,KLOR-CON) 20 MEQ tablet take 1 tablet by mouth once daily 01/06/17  Yes Weaver, Scott T, PA-C  Probiotic Product (ALIGN PO) Take 1 tablet by mouth daily.   Yes [provider]  torsemide (DEMADEX) 20 MG tablet Take 20 mg by mouth daily. Take 1 to 2 tablets by mouth daily as needed for swelling.   Yes [provider]  trolamine salicylate (ASPERCREME) 10 % cream Apply 1 application topically as needed for muscle pain.   Yes [provider]  valsartan-hydrochlorothiazide (DIOVAN-HCT) 160-12.5 MG per tablet Take 1 tablet by mouth daily.  07/05/13  Yes [provider]  Vitamin D, Ergocalciferol, (DRISDOL) 50000 UNITS CAPS capsule Take 50,000 Units by mouth  every 7 (seven) days. Take on Mondays   Yes [provider]  albuterol (PROVENTIL HFA;VENTOLIN HFA) 108 (90 Base) MCG/ACT inhaler Inhale 2 puffs into the lungs as directed.    [provider]  AMBULATORY NON FORMULARY MEDICATION Inject 300 mg into the skin as directed. Medication Name: Inclisiran sodium vs placebo Patient not taking: Reported on 04/06/2017 10/18/16   [name], MD  fluticasone [facility]) 50 MCG/ACT nasal spray Place 2 sprays into both nostrils daily. 05/06/14   Esperanza Sheets, MD  HYDROcodone-acetaminophen (NORCO/VICODIN) 5-325 MG per tablet Take 0.5-1 tablets by mouth every 6 (six) hours as needed for moderate pain. Patient not taking: Reported on 04/06/2017 11/12/14   Osvaldo Shipper, MD  oxyCODONE-acetaminophen (PERCOCET) 5-325 MG tablet Take 1 tablet by mouth every 4 (four) hours as needed for moderate pain. Patient not taking: Reported on 04/06/2017 01/09/17   Dione Booze, MD    Physical Exam: Vitals:   04/06/17 2101 04/06/17 2130 04/06/17 2200 04/06/17 2230  BP: 133/62 139/71 (!) 127/59 131/78  Pulse: 74 72 73 73  Resp: 18 20 14 17   Temp:      TempSrc:      SpO2: 94% 97% 98% 92%     General: Appears calm and comfortable and is NAD Eyes:  PERRL, EOMI, normal lids, iris ENT:  grossly normal hearing, lips & tongue, mmm Neck:  no LAD, masses or thyromegaly Cardiovascular:  RRR, no m/r/g. No LE edema.  Respiratory:  CTA bilaterally, no w/r/r. Normal respiratory effort. Abdomen:  soft, ntnd, NABS Skin:  no rash or induration seen on limited exam Musculoskeletal:  grossly normal tone BUE/BLE, good ROM, no bony abnormality Psychiatric:  grossly normal mood and affect, speech fluent and appropriate, AOx3 Neurologic:  CN 2-12 grossly intact, moves all extremities in coordinated fashion, sensation intact  Labs on Admission: I have personally reviewed following labs and imaging studies  CBC:  Recent Labs Lab 04/06/17 1610  WBC 6.2  HGB 11.6*    HCT 35.0*  MCV 83.5  PLT 244   Basic Metabolic Panel:  Recent Labs Lab 04/06/17 1610  NA 139  K 3.2*  CL 101  CO2 25  GLUCOSE 122*  BUN 22*  CREATININE 1.26*  CALCIUM 9.2   GFR: CrCl cannot be calculated (Unknown ideal weight.). Liver Function Tests: No results for input(s): AST, ALT, ALKPHOS, BILITOT, PROT, ALBUMIN in the last 168 hours. No results for input(s): LIPASE, AMYLASE in the last 168 hours. No results for input(s): AMMONIA in the last 168 hours. Coagulation Profile: No results for input(s): INR, PROTIME in the last 168 hours. Cardiac Enzymes: No results for input(s): CKTOTAL, CKMB, CKMBINDEX, TROPONINI in the last 168 hours. BNP (last 3 results) No results for input(s): PROBNP in the last 8760 hours. HbA1C: No results for input(s): HGBA1C in the last 72 hours. CBG: No results for input(s): GLUCAP in the last 168 hours. Lipid Profile: No results for input(s): CHOL, HDL, LDLCALC, TRIG, CHOLHDL, LDLDIRECT in the last 72 hours. Thyroid Function Tests: No results for input(s): TSH, T4TOTAL, FREET4, T3FREE, THYROIDAB in the last 72 hours. Anemia Panel: No results for input(s): VITAMINB12, FOLATE, FERRITIN, TIBC, IRON, RETICCTPCT in the last 72 hours. Urine analysis:    Component Value Date/Time   COLORURINE YELLOW 01/09/2017 0300   APPEARANCEUR CLEAR 01/09/2017 0300   LABSPEC 1.008 01/09/2017 0300   PHURINE 6.0 01/09/2017 0300   GLUCOSEU NEGATIVE 01/09/2017 0300   HGBUR NEGATIVE 01/09/2017 0300   BILIRUBINUR NEGATIVE 01/09/2017 0300   BILIRUBINUR neg 12/02/2013 2147   KETONESUR NEGATIVE 01/09/2017 0300   PROTEINUR NEGATIVE 01/09/2017 0300   UROBILINOGEN 0.2 12/02/2013 2147   UROBILINOGEN 0.2 03/26/2013 1256   NITRITE NEGATIVE 01/09/2017 0300   LEUKOCYTESUR NEGATIVE 01/09/2017 0300    Creatinine Clearance: CrCl cannot be calculated (Unknown ideal weight.).  Sepsis Labs: @LABRCNTIP (procalcitonin:4,lacticidven:4) )No results found for this or any  previous visit (from the past 240 hour(s)).   Radiological Exams on Admission: Dg Chest 2 View  Result Date: 04/06/2017 CLINICAL DATA:  81 y/o  F; chest pain. EXAM: CHEST  2 VIEW COMPARISON:  12/29/2015 chest radiograph FINDINGS: Stable normal cardiac silhouette. Aortic atherosclerosis with calcification. Clear lungs. No pleural effusion or pneumothorax. Mild multilevel degenerative changes of the thoracic spine. IMPRESSION: No active cardiopulmonary disease. Electronically Signed   By: Mitzi Hansen M.D.   On: 04/06/2017 17:05    EKG: Independently reviewed.  NSR with rate 74; nonspecific ST changes with no evidence of acute ischemia  Assessment/Plan Principal Problem:   Chest pain Active Problems:   Coronary artery disease involving native coronary artery of native heart with angina pectoris (HCC)   A-fib (HCC)   Essential hypertension   Chronic diastolic CHF (congestive heart failure) (HCC)   High cholesterol   CKD (chronic kidney disease) stage 3, GFR 30-59 ml/min   Hyperglycemia   Chest pain -Patient with right-sided chest pressure that has been present for 4 days, appears to be exertional and improved with rest but unchanged since its onset. -2/3 typical symptoms suggestive of atypical cardiac chest pain.  -CXR unremarkable.   -Initial cardiac troponin negative (0).  -EKG not indicative of acute ischemia.   -GRACE score is 128; which predicts an in-hospital death rate of 2%.  -Will plan to place in observation status on telemetry to rule out ACS by overnight observation.  -cycle troponin q6h x 3 and repeat EKG in AM -Continue ASA 81 mg and Plavix daily -morphine given -Risk factor stratification with HgbA1c and FLP; will also check TSH and UDS -Cardiology consultation in AM - NPO for possible stress test; message sent to [employer] via Inbox messaging -  Will plan to start Heparin drip if enzymes are positive and/or chest pain worsens -NOTE: Patient continues to  have mild continuous chest pain.  After 4 days and a negative troponin, it seems unlikely that this will be unstable angina or an NSTEMI, although it is possible.  Unfortunately, hospital policy apparently requires her to be placed in SDU due to ongoing chest pain at this time.  HTN -Takes Lopressor and Diovan-HCT at home -Patient with good control while in the ER  HLD -She reports a generalized statin "allergy" - myalgias, makes her feel bad, etc. -Lipids were checked in 4/17 (TC 271, HDL 49, LDL 204, TG 90); will repeat  Hyperglycemia -Glucose 122 -Will check A1c -There is no indication to start medication at this time  Afib -On ASA/Plavix without AC -Rate controlled  Diastolic CHF -4/17 Echo with preserved EF -3/16 Echo reported grade 1 diastolic dysfunction -Appears to be compensated at this time  CKD -BUN 22/Creatinine 1.26/GFR 46 - improved -Will follow    DVT prophylaxis: Lovenox  Code Status: DNR - confirmed with patient/family Family Communication: Family present throughout evaluation  Disposition Plan:  Home once clinically improved Consults called: Cardiology via inbox message to [employer]  Admission status: It is my clinical opinion that referral for OBSERVATION is reasonable and necessary in this patient based on the above information provided. The aforementioned taken together are felt to place the patient at high risk for further clinical deterioration. However it is anticipated that the patient may be medically stable for discharge from the hospital within 24 to 48 hours.    Jonah BlueJennifer Graclyn Lawther MD [facility]  If 7PM-7AM, please contact night-coverage [email or web address] Password [email or web address]  04/06/2017, 11:27 PM

## 2017-04-06 NOTE — ED Notes (Addendum)
Yates MD notified about patients improving chest pain. RN called Consulting civil engineerCharge RN on 3W and clarified if the unit could take active chest pain. Charge RN stated that if patient is having active chest pain that they require a step down bed status. Yates MD was notified of the information and placed appropriate orders.

## 2017-04-07 ENCOUNTER — Encounter (HOSPITAL_COMMUNITY)
Admission: EM | Disposition: A | Discharge status: Home / Self Care | Payer: Self-pay | Source: Home / Self Care | Attending: Emergency Medicine

## 2017-04-07 DIAGNOSIS — I480 Paroxysmal atrial fibrillation: Secondary | ICD-10-CM | POA: Diagnosis not present

## 2017-04-07 DIAGNOSIS — N183 Chronic kidney disease, stage 3 (moderate): Secondary | ICD-10-CM | POA: Diagnosis not present

## 2017-04-07 DIAGNOSIS — R079 Chest pain, unspecified: Secondary | ICD-10-CM | POA: Diagnosis not present

## 2017-04-07 DIAGNOSIS — I25119 Atherosclerotic heart disease of native coronary artery with unspecified angina pectoris: Secondary | ICD-10-CM

## 2017-04-07 DIAGNOSIS — Z955 Presence of coronary angioplasty implant and graft: Secondary | ICD-10-CM | POA: Diagnosis not present

## 2017-04-07 DIAGNOSIS — I10 Essential (primary) hypertension: Secondary | ICD-10-CM | POA: Diagnosis not present

## 2017-04-07 DIAGNOSIS — R0609 Other forms of dyspnea: Secondary | ICD-10-CM | POA: Diagnosis not present

## 2017-04-07 DIAGNOSIS — I252 Old myocardial infarction: Secondary | ICD-10-CM | POA: Diagnosis not present

## 2017-04-07 HISTORY — PX: LEFT HEART CATH AND CORONARY ANGIOGRAPHY: CATH118249

## 2017-04-07 LAB — TROPONIN I

## 2017-04-07 LAB — BASIC METABOLIC PANEL
ANION GAP: 8 (ref 5–15)
BUN: 17 mg/dL (ref 6–20)
CALCIUM: 9.4 mg/dL (ref 8.9–10.3)
CO2: 31 mmol/L (ref 22–32)
Chloride: 103 mmol/L (ref 101–111)
Creatinine, Ser: 1.07 mg/dL — ABNORMAL HIGH (ref 0.44–1.00)
GFR calc Af Amer: 56 mL/min — ABNORMAL LOW (ref >60)
GFR, EST NON AFRICAN AMERICAN: 48 mL/min — AB (ref >60)
GLUCOSE: 119 mg/dL — AB (ref 65–99)
Potassium: 3.2 mmol/L — ABNORMAL LOW (ref 3.5–5.1)
SODIUM: 142 mmol/L (ref 135–145)

## 2017-04-07 LAB — PROTIME-INR
INR: 1.05
Prothrombin Time: 13.7 seconds (ref 11.4–15.2)

## 2017-04-07 SURGERY — LEFT HEART CATH AND CORONARY ANGIOGRAPHY
Anesthesia: LOCAL

## 2017-04-07 MED ORDER — SODIUM CHLORIDE 0.9 % WEIGHT BASED INFUSION: 1.0000 mL/kg/h | INTRAVENOUS | Status: DC

## 2017-04-07 MED ORDER — HYDROCHLOROTHIAZIDE 12.5 MG PO CAPS
12.5000 mg | ORAL_CAPSULE | Freq: Every day | ORAL | Status: DC
  Administered 2017-04-08: 12.5 mg via ORAL
  Filled 2017-04-07: qty 1

## 2017-04-07 MED ORDER — MIDAZOLAM HCL 2 MG/2ML IJ SOLN
INTRAMUSCULAR | Status: DC | PRN
  Administered 2017-04-07 (×3): 1 mg via INTRAVENOUS

## 2017-04-07 MED ORDER — MIDAZOLAM HCL 2 MG/2ML IJ SOLN
INTRAMUSCULAR | Status: AC
  Filled 2017-04-07: qty 2

## 2017-04-07 MED ORDER — SODIUM CHLORIDE 0.9 % IV SOLN: 250.0000 mL | INTRAVENOUS | Status: DC | PRN

## 2017-04-07 MED ORDER — FLUTICASONE PROPIONATE 50 MCG/ACT NA SUSP
2.0000 | Freq: Every day | NASAL | Status: DC
  Filled 2017-04-07: qty 16

## 2017-04-07 MED ORDER — VALSARTAN-HYDROCHLOROTHIAZIDE 160-12.5 MG PO TABS: 1.0000 | ORAL_TABLET | Freq: Every day | ORAL | Status: DC

## 2017-04-07 MED ORDER — DIPHENHYDRAMINE HCL 50 MG/ML IJ SOLN
25.0000 mg | Freq: Once | INTRAMUSCULAR | Status: AC
Start: 2017-04-07 — End: 2017-04-07
  Administered 2017-04-07: 25 mg via INTRAVENOUS
  Filled 2017-04-07: qty 1

## 2017-04-07 MED ORDER — POTASSIUM CHLORIDE CRYS ER 20 MEQ PO TBCR
40.0000 meq | EXTENDED_RELEASE_TABLET | Freq: Every day | ORAL | Status: DC
  Administered 2017-04-07 – 2017-04-08 (×2): 40 meq via ORAL
  Filled 2017-04-07 (×2): qty 2

## 2017-04-07 MED ORDER — ENOXAPARIN SODIUM 40 MG/0.4ML ~~LOC~~ SOLN: 40.0000 mg | Freq: Every day | SUBCUTANEOUS | Status: DC

## 2017-04-07 MED ORDER — SODIUM CHLORIDE 0.9 % WEIGHT BASED INFUSION: 3.0000 mL/kg/h | INTRAVENOUS | Status: DC

## 2017-04-07 MED ORDER — ASPIRIN 81 MG PO CHEW: 81.0000 mg | CHEWABLE_TABLET | ORAL | Status: DC

## 2017-04-07 MED ORDER — LORATADINE 10 MG PO TABS
10.0000 mg | ORAL_TABLET | Freq: Every day | ORAL | Status: DC
  Administered 2017-04-07 – 2017-04-08 (×2): 10 mg via ORAL
  Filled 2017-04-07 (×2): qty 1

## 2017-04-07 MED ORDER — LIDOCAINE HCL (PF) 1 % IJ SOLN
INTRAMUSCULAR | Status: AC
  Filled 2017-04-07: qty 30

## 2017-04-07 MED ORDER — ACETAMINOPHEN 325 MG PO TABS
650.0000 mg | ORAL_TABLET | ORAL | Status: DC | PRN
  Filled 2017-04-07: qty 2

## 2017-04-07 MED ORDER — ONDANSETRON HCL 4 MG/2ML IJ SOLN: 4.0000 mg | Freq: Four times a day (QID) | INTRAMUSCULAR | Status: DC | PRN

## 2017-04-07 MED ORDER — ASPIRIN EC 81 MG PO TBEC
81.0000 mg | DELAYED_RELEASE_TABLET | Freq: Every day | ORAL | Status: DC
  Administered 2017-04-07: 81 mg via ORAL
  Filled 2017-04-07: qty 1

## 2017-04-07 MED ORDER — IOPAMIDOL (ISOVUE-370) INJECTION 76%
INTRAVENOUS | Status: DC | PRN
  Administered 2017-04-07: 100 mL via INTRA_ARTERIAL

## 2017-04-07 MED ORDER — CLOPIDOGREL BISULFATE 75 MG PO TABS
75.0000 mg | ORAL_TABLET | Freq: Every day | ORAL | Status: DC
  Administered 2017-04-07 – 2017-04-08 (×2): 75 mg via ORAL
  Filled 2017-04-07 (×2): qty 1

## 2017-04-07 MED ORDER — METOPROLOL TARTRATE 25 MG PO TABS
25.0000 mg | ORAL_TABLET | Freq: Two times a day (BID) | ORAL | Status: DC
  Administered 2017-04-07 – 2017-04-08 (×3): 25 mg via ORAL
  Filled 2017-04-07 (×3): qty 1

## 2017-04-07 MED ORDER — IOPAMIDOL (ISOVUE-370) INJECTION 76%
INTRAVENOUS | Status: AC
  Filled 2017-04-07: qty 100

## 2017-04-07 MED ORDER — POLYETHYLENE GLYCOL 3350 17 G PO PACK
17.0000 g | PACK | Freq: Every day | ORAL | Status: DC
  Administered 2017-04-07 – 2017-04-08 (×2): 17 g via ORAL
  Filled 2017-04-07 (×2): qty 1

## 2017-04-07 MED ORDER — ENOXAPARIN SODIUM 40 MG/0.4ML ~~LOC~~ SOLN
40.0000 mg | SUBCUTANEOUS | Status: DC
  Administered 2017-04-08: 40 mg via SUBCUTANEOUS
  Filled 2017-04-07: qty 0.4

## 2017-04-07 MED ORDER — HEPARIN (PORCINE) IN NACL 2-0.9 UNIT/ML-% IJ SOLN
INTRAMUSCULAR | Status: AC
  Filled 2017-04-07: qty 1000

## 2017-04-07 MED ORDER — FENTANYL CITRATE (PF) 100 MCG/2ML IJ SOLN
INTRAMUSCULAR | Status: AC
  Filled 2017-04-07: qty 2

## 2017-04-07 MED ORDER — ASPIRIN 81 MG PO CHEW
81.0000 mg | CHEWABLE_TABLET | Freq: Every day | ORAL | Status: DC
  Administered 2017-04-08: 81 mg via ORAL
  Filled 2017-04-07: qty 1

## 2017-04-07 MED ORDER — PREGABALIN 25 MG PO CAPS
75.0000 mg | ORAL_CAPSULE | Freq: Every day | ORAL | Status: DC
  Administered 2017-04-07: 75 mg via ORAL
  Filled 2017-04-07: qty 3

## 2017-04-07 MED ORDER — SODIUM CHLORIDE 0.9% FLUSH
3.0000 mL | Freq: Two times a day (BID) | INTRAVENOUS | Status: DC
  Administered 2017-04-07: 3 mL via INTRAVENOUS

## 2017-04-07 MED ORDER — POTASSIUM CHLORIDE CRYS ER 20 MEQ PO TBCR: 20.0000 meq | EXTENDED_RELEASE_TABLET | Freq: Every day | ORAL | Status: DC

## 2017-04-07 MED ORDER — LIDOCAINE HCL (PF) 1 % IJ SOLN
INTRAMUSCULAR | Status: DC | PRN
  Administered 2017-04-07: 30 mL via SUBCUTANEOUS

## 2017-04-07 MED ORDER — SODIUM CHLORIDE 0.9 % WEIGHT BASED INFUSION: 1.0000 mL/kg/h | INTRAVENOUS | Status: AC

## 2017-04-07 MED ORDER — IRBESARTAN 150 MG PO TABS
150.0000 mg | ORAL_TABLET | Freq: Every day | ORAL | Status: DC
  Administered 2017-04-08: 150 mg via ORAL
  Filled 2017-04-07: qty 1

## 2017-04-07 MED ORDER — SODIUM CHLORIDE 0.9% FLUSH: 3.0000 mL | INTRAVENOUS | Status: DC | PRN

## 2017-04-07 MED ORDER — SODIUM CHLORIDE 0.9% FLUSH
3.0000 mL | Freq: Two times a day (BID) | INTRAVENOUS | Status: DC
  Administered 2017-04-08: 3 mL via INTRAVENOUS

## 2017-04-07 MED ORDER — FENTANYL CITRATE (PF) 100 MCG/2ML IJ SOLN
INTRAMUSCULAR | Status: DC | PRN
  Administered 2017-04-07 (×2): 50 ug via INTRAVENOUS

## 2017-04-07 MED ORDER — PANTOPRAZOLE SODIUM 40 MG PO TBEC
40.0000 mg | DELAYED_RELEASE_TABLET | Freq: Every day | ORAL | Status: DC
  Administered 2017-04-07 – 2017-04-08 (×2): 40 mg via ORAL
  Filled 2017-04-07 (×2): qty 1

## 2017-04-07 MED ORDER — HEPARIN (PORCINE) IN NACL 2-0.9 UNIT/ML-% IJ SOLN
INTRAMUSCULAR | Status: AC | PRN
  Administered 2017-04-07: 1000 mL via INTRA_ARTERIAL

## 2017-04-07 MED ORDER — FLUTICASONE PROPIONATE 50 MCG/ACT NA SUSP
1.0000 | Freq: Every day | NASAL | Status: DC
  Filled 2017-04-07: qty 16

## 2017-04-07 MED ORDER — ALPRAZOLAM 0.25 MG PO TABS
0.2500 mg | ORAL_TABLET | Freq: Two times a day (BID) | ORAL | Status: DC | PRN
  Administered 2017-04-07: 0.25 mg via ORAL
  Filled 2017-04-07: qty 1

## 2017-04-07 SURGICAL SUPPLY — 12 items
CATH INFINITI 5FR JL4 (CATHETERS) ×1 IMPLANT
CATH INFINITI 5FR MPB2 (CATHETERS) ×1 IMPLANT
COVER PRB 48X5XTLSCP FOLD TPE (BAG) IMPLANT
COVER PROBE 5X48 (BAG) ×2
GLIDESHEATH SLEND A-KIT 6F 22G (SHEATH) IMPLANT
KIT HEART LEFT (KITS) ×2 IMPLANT
PACK CARDIAC CATHETERIZATION (CUSTOM PROCEDURE TRAY) ×2 IMPLANT
SHEATH PINNACLE 5F 10CM (SHEATH) ×1 IMPLANT
SHEATH PINNACLE 7F 10CM (SHEATH) ×1 IMPLANT
TRANSDUCER W/STOPCOCK (MISCELLANEOUS) ×2 IMPLANT
TUBING CIL FLEX 10 FLL-RA (TUBING) ×2 IMPLANT
WIRE EMERALD 3MM-J .035X150CM (WIRE) ×1 IMPLANT

## 2017-04-07 NOTE — Progress Notes (Signed)
Site area: Right groin a 5 french arterial sheath was removed by Loma Messingerinda Carter RCIS  Site Prior to Removal:  Level 0  Pressure Applied For 20 MINUTES    Bedrest Beginning at 1840p  Manual:   Yes.    Patient Status During Pull:  stable  Post Pull Groin Site:  Level 0  Post Pull Instructions Given:  Yes.    Post Pull Pulses Present:  Yes.    Dressing Applied:  Yes.    Comments:  VS remain stable

## 2017-04-07 NOTE — Interval H&P Note (Signed)
Cath Lab Visit (complete for each Cath Lab visit)  Clinical Evaluation Leading to the Procedure:   ACS: No.  Non-ACS:    Anginal Classification: CCS III  Anti-ischemic medical therapy: Maximal Therapy (2 or more classes of medications)  Non-Invasive Test Results: No non-invasive testing performed  Prior CABG: No previous CABG      History and Physical Interval Note:  04/07/2017 4:55 PM  [name]  has presented today for surgery, with the diagnosis of cp  The various methods of treatment have been discussed with the patient and family. After consideration of risks, benefits and other options for treatment, the patient has consented to  Procedure(s): LEFT HEART CATH AND CORONARY ANGIOGRAPHY (N/A) as a surgical intervention .  The patient's history has been reviewed, patient examined, no change in status, stable for surgery.  I have reviewed the patient's chart and labs.  Questions were answered to the patient's satisfaction.     Lyn RecordsHenry W Smith III

## 2017-04-07 NOTE — Progress Notes (Addendum)
Assumed care from off going RN @0718 ; patient resting and showed no signs of acute distress; patient' SR on tele; denies CP at this time; bed in lowest position; call bell in reach; husband at bedside; patient been NPO all morning shift and ready for heart cath today   @1905  patient just returned back from cath via bed; right groin shows no signs of bleeding; no hematoma; no c/o's pain; family at bedside

## 2017-04-07 NOTE — Consult Note (Signed)
Cardiology Consultation:   Patient ID: [name]; [id number]; [birth year]   Admit date: 04/06/2017 Date of Consult: 04/07/2017  Primary Care Provider: Andi Devon, MD Primary Cardiologist: Dr. Katrinka Blazing    Patient Profile:   [name] is a 81 y.o. female with a hx of  CAD (s/p DES to LAD 2006, DES to Cx 2014, angiosculpt scoring balloon/DES to LAD 2016, DES to RCA 01/2016), chronic diastolic CHF, HTN, paroxysmal atrial fib/flutter (not on anticoagulation), hyperlipidemia, pre-diabetes, chronic LEE (L>R), fibromyalgia, anxiety, remote h/o stomach ulcers who is being seen today for the evaluation of chest pain at the request of Dr. Mahala Menghini.   Last echo 12/2015 showed LVEF of 55-60%, PA pressure of 55-60%.   Last stress test 07/2016 is low risk for SSCP and bilateral jaw pain.   History of Present Illness:   [name] treated with antibiotics and prednisone 2 weeks ago for possible bronchitis. She felt lack of energy since then however worsened in past 2 days with exertional dyspnea. She was unable to walk from one room to another which was unusual for her. She also had a few episode of chest tightness. She took sublingual nitroglycerin x 1 with improved symptoms. Yesterday while at church she was extremely fatigued and tired with shortness of breath and right-sided chest pressure. She needed to take multiple stop/break to get energy back. Due to waxing and waning symptoms she came to ER for further evaluation and admitted for rule out.  Point-of-care troponin negative. Troponin I less than 0.03 x 2. BNP normal. Serum creatinine 1.26. D-dimer normal. Chest x-ray without acute cardiopulmonary disease. Preliminary result of lower extremity Doppler negative for DVT.EKG this morning shows sinus rhythm with T-wave inversion anterior laterally which is similar to prior EKG of 07/05/16 and 10/04/16- personally reviewed.  She is chest pain-free since admission however did not  ambulated. Her symptoms is somewhat similar to prior angina when she had a stent placement however no radiation to jaw. She denies orthopnea, PND, syncope, melena or blood in her stool or urine. He still has intermittent dry cough with clear phlegm.  Past Medical History:  Diagnosis Date  . Anxiety   . Arthritis    Multiple joints  . Chronic diastolic CHF (congestive heart failure) (HCC) 01/16/2016  . Coronary artery disease    a. s/p DES to LAD 2006. b. DES to Cx 2014. c. angiosculpt scoring balloon/DES to LAD 2016. d. DES to RCA 12/2015.  . Fibromyalgia   . GERD (gastroesophageal reflux disease)   . High cholesterol   . History of stomach ulcers    Remotely  . Hypertension   . Lower extremity edema    a. h/o chronic edema L>R.  [name] Neuropathy   . Osteoporosis   . PAF (paroxysmal atrial fibrillation) (HCC)    a. 2012 - H/P says atrial flutter, D/c summary says atrial fib - was tx with IV amiodarone, only had a few hours of arrhythmias, no clinical recurrence as of 2017.  [name] Ventricular hypertrophy   . Vertigo 08/27/2006    Past Surgical History:  Procedure Laterality Date  . ABDOMINAL HYSTERECTOMY  1970's  . BREAST BIOPSY Right 1970's  . BREAST LUMPECTOMY Right 1970's   benign   . CARDIAC CATHETERIZATION  10/2006   [name] 08/03/2007 (01/27/2013)  . CARDIAC CATHETERIZATION N/A 01/01/2016   Procedure: Left Heart Cath and Coronary Angiography;  Surgeon: Kathleene Hazel, MD;  Location: [facility] INVASIVE CV LAB;  Service: Cardiovascular;  Laterality: N/A;  .  CARDIAC CATHETERIZATION N/A 01/01/2016   Procedure: Coronary Stent Intervention;  Surgeon: Kathleene Hazel, MD;  Location: [facility] INVASIVE CV LAB;  Service: Cardiovascular;  Laterality: N/A;  . CORONARY ANGIOPLASTY WITH STENT PLACEMENT  06/2005; 01/27/2013    2.515 mm Cypher stent to LAD in 2006; 2.75 x 12 Promus DES to the circumflex 2014    . DIAGNOSTIC LAPAROSCOPY  2000   lap abdominal lysis of adhesions  .  ESOPHAGOGASTRODUODENOSCOPY N/A 11/10/2014   Procedure: ESOPHAGOGASTRODUODENOSCOPY (EGD);  Surgeon: Jeani Hawking, MD;  Location: [facility] ENDOSCOPY;  Service: Endoscopy;  Laterality: N/A;  . HERNIA REPAIR    . LAPAROSCOPIC SALPINGO OOPHERECTOMY  2000  . LAPAROTOMY     "day after oophorectomy; had bowel obstruction" (01/27/2013)  . LEFT HEART CATHETERIZATION WITH CORONARY ANGIOGRAM N/A 01/27/2013   Procedure: LEFT HEART CATHETERIZATION WITH CORONARY ANGIOGRAM;  Surgeon: Corky Crafts, MD;  Location: [facility] CATH LAB;  Service: Cardiovascular;  Laterality: N/A;  . LEFT HEART CATHETERIZATION WITH CORONARY ANGIOGRAM N/A 11/10/2014   Procedure: LEFT HEART CATHETERIZATION WITH CORONARY ANGIOGRAM;  Surgeon: Lennette Bihari, MD; left main okay, LAD 75% ISR, FFR 0.75, s/p Angiosculpt scoring balloon and 2.7518 mm Resolute DES, CFX 70%, stent patent, RCA 50%  . TONSILLECTOMY AND ADENOIDECTOMY  ~ 1952     Inpatient Medications: Scheduled Meds: . aspirin EC  81 mg Oral Daily  . clopidogrel  75 mg Oral Daily  . enoxaparin (LOVENOX) injection  40 mg Subcutaneous Daily  . fluticasone  1 spray Each Nare Daily  . irbesartan  150 mg Oral Daily   And  . hydrochlorothiazide  12.5 mg Oral Daily  . loratadine  10 mg Oral Daily  . metoprolol tartrate  25 mg Oral BID  . pantoprazole  40 mg Oral Daily  . potassium chloride  40 mEq Oral Daily  . pregabalin  75 mg Oral QHS   Continuous Infusions:  PRN Meds: acetaminophen, ALPRAZolam, gi cocktail, morphine injection, ondansetron (ZOFRAN) IV  Allergies:    Allergies  Allergen Reactions  . Adhesive [Tape] Itching and Rash  . Crestor [Rosuvastatin Calcium]     Body myalgias on 10mg  twice weekly  . Statins     Atorvastatin 40mg , 20mg , and 10mg , rosuvastatin, simvastatin - myalgias; pitavastatin 2mg   "feels bad". Tolerating Crestor 10mg  4x/week as of 2017  . Codeine Itching    Tolerates low dose of norco  . Elavil [Amitriptyline] Other (See Comments)     Dissociation    Social History:   Social History   Social History  . Marital status: Married    Spouse name: N/A  . Number of children: 3  . Years of education: masters   Occupational History  . retired    Social History Main Topics  . Smoking status: Former Smoker    Packs/day: 0.12    Years: 36.00    Types: Cigarettes    Quit date: 12/02/1991  . Smokeless tobacco: Never Used  . Alcohol use No  . Drug use: No  . Sexual activity: Not Currently    Birth control/ protection: Surgical   Other Topics Concern  . Not on file   Social History Narrative  . No narrative on file    Family History:   Family History  Problem Relation Age of Onset  . Stroke Mother   . Heart attack Father      ROS:  Please see the history of present illness.  ROS All other ROS reviewed and negative.     Physical  Exam/Data:   Vitals:   04/07/17 0138 04/07/17 0200 04/07/17 0300 04/07/17 0800  BP: (!) 111/93 (!) 114/59 109/65 118/62  Pulse: 69 72 78 73  Resp: 19 13 19 12   Temp: 97.6 F (36.4 C)   (!) 97.5 F (36.4 C)  TempSrc: Oral     SpO2: 97% 97% 94% 98%  Weight: 220 lb 12.8 oz (100.2 kg)     Height: 5\' 6"  (1.676 m)      No intake or output data in the 24 hours ending 04/07/17 1105 Filed Weights   04/07/17 0138  Weight: 220 lb 12.8 oz (100.2 kg)   Body mass index is 35.64 kg/m.  General:  Well nourished, well developed, in no acute distress HEENT: normal Lymph: no adenopathy Neck: no JVD Endocrine:  No thryomegaly Vascular: No carotid bruits; FA pulses 2+ bilaterally without bruits  Cardiac:  normal S1, S2; RRR; no murmur  Lungs:  clear to auscultation bilaterally, no wheezing, rhonchi or rales  Abd: soft, nontender, no hepatomegaly  Ext: LE edema 1+ (L > R) Musculoskeletal:  No deformities, BUE and BLE strength normal and equal Skin: warm and dry  Neuro:  CNs 2-12 intact, no focal abnormalities noted Psych:  Normal affect   Relevant CV Studies: Cath  01/2016 Conclusion    1st Diag lesion, 70% stenosed.  Prox LAD to Mid LAD lesion, 30% stenosed.  Dist LAD lesion, 10% stenosed. The lesion was previously treated with a drug-eluting stent one to two years ago.  Ost Cx lesion, 60% stenosed.  LM lesion, 20% stenosed.  Mid RCA lesion, 40% stenosed.  Ost RCA to Prox RCA lesion, 50% stenosed.  Ost RCA lesion, 90% stenosed. Post intervention, there is a 0% residual stenosis.  The left ventricular systolic function is normal.   1. Triple vessel CAD with unstable angina. Patent stents mid LAD and Circumflex 2. Severe stenosis ostium RCA.  3. Normal LV systolic function.  4. Successful PTCA/DES x 1 ostium RCA  Recommendations: Will continue ASA, Plavix for at least one year.   Diagnostic Diagram       Post-Intervention Diagram           Laboratory Data:  Chemistry Recent Labs Lab 04/06/17 1610  NA 139  K 3.2*  CL 101  CO2 25  GLUCOSE 122*  BUN 22*  CREATININE 1.26*  CALCIUM 9.2  GFRNONAA 40*  GFRAA 46*  ANIONGAP 13    No results for input(s): PROT, ALBUMIN, AST, ALT, ALKPHOS, BILITOT in the last 168 hours. Hematology Recent Labs Lab 04/06/17 1610  WBC 6.2  RBC 4.19  HGB 11.6*  HCT 35.0*  MCV 83.5  MCH 27.7  MCHC 33.1  RDW 15.2  PLT 244   Cardiac Enzymes Recent Labs Lab 04/07/17 0314 04/07/17 0746  TROPONINI <0.03 <0.03    Recent Labs Lab 04/06/17 1631  TROPIPOC 0.00    BNP Recent Labs Lab 04/06/17 1725  BNP 84.7    DDimer  Recent Labs Lab 04/06/17 1725  DDIMER 0.39    Radiology/Studies:  Dg Chest 2 View  Result Date: 04/06/2017 CLINICAL DATA:  81 y/o  F; chest pain. EXAM: CHEST  2 VIEW COMPARISON:  12/29/2015 chest radiograph FINDINGS: Stable normal cardiac silhouette. Aortic atherosclerosis with calcification. Clear lungs. No pleural effusion or pneumothorax. Mild multilevel degenerative changes of the thoracic spine. IMPRESSION: No active cardiopulmonary disease.  Electronically Signed   By: Mitzi HansenLance  Furusawa-Stratton M.D.   On: 04/06/2017 17:05    Assessment and Plan:  1. Chest pain  - Has both typical and atypical features. However concerning for angina.  Her symptoms occurs with exertion and relieves with rest. Responsive to sublingual nitroglycerin. Her symptoms somewhat similar to prior angina when she had a stent placement. EKG without acute changes. Troponin negative.BNP normal. D-dimer negative. CXr clear.  - Will do cath today. Patient is NPO.   2. CAD (s/p DES to LAD 2006, DES to Cx 2014, angiosculpt scoring balloon/DES to LAD 2016, DES to RCA 01/2016) - Last report of cath 01/2016 as above. Low risk myoview 07/2016. - Continue ASA, plavix, BB and ARB  3. HLD - No results found for requested labs within last 8760 hours.  Undergoing evaluation for ORION Trial.   4. LE edema L > R - D-dimer negative. No DVT on preliminary report on LE venous doppler.   5. HTN - Stable and well controlled on current medications.    Catherine Pont, PA  04/07/2017 11:05 AM   Patient seen and examined and history reviewed. Agree with above findings and plan. 81 yo BF with extensive history of CAD s/p most recent stenting of the ostium of the RCA in April 2017. Was seen by primary care 3 weeks ago and treated for sinus and bronchitis with antibiotics and steroids. Now presents with progressive symptoms of dyspnea on exertion and chest tightness on exertion. Relief with sl Ntg. Some cough but sputum changed from thick yellow to clear. On exam she has no JVD. Lungs are clear. No gallop, murmur or click. Some L>R edema that is chronic.  Troponin, BNP, D-dimer, LE venous doppler all negative. Ecg without acute change. CXR is clear. At this point I do not have a clear reason for her progressive symptoms. No venous embolic disease. No CHF. Bronchitis appropriately treated and improving. No wheezing. I am concerned she may have progressive CAD or restenosis.  I would recommend cardiac cath given the rather dramatic limitation she has developed in the last couple of days. I spoke to the patient and she is agreeable. The procedure and risks were reviewed including but not limited to death, myocardial infarction, stroke, arrythmias, bleeding, transfusion, emergency surgery, dye allergy, or renal dysfunction. The patient voices understanding and is agreeable to proceed. We will see if this can be scheduled for today. She is NPO.   [name], MDFACC 04/07/2017 11:58 AM

## 2017-04-07 NOTE — Progress Notes (Signed)
PROGRESS NOTE    [name]  ZOX:[id number] DOB: [birth year] DOA: 04/06/2017 PCP: Andi DevonShelton, Kimberly, MD  Outpatient Specialists:     Brief Narrative:  81 year old female CADH/o-last 2017, nuclear test 07/2016 no inducible ischemia  DES-->LAD 2006, DE S-->CX 2014, DES to LAD 2016, DES to RCA 12/2015 Chronic diastolic CHF HTN Atrial fibrillation flutter-self resolve 2012 Chronic left lower extremity edema for many years Prior stomach ulcers  Admitted with chest pain 04/06/17 States that she's had respiratory and sinus-like issues over the past 3 weeks and has right shoulder and back discomfort which she relates to be from this She recently had a knee injection which has helped her with mobility  Point-of-care troponin so far negative, EKG is unchanged Cardiology consulted given insidious and chronic CAD history    Assessment & Plan:   Principal Problem:   Chest pain Active Problems:   Coronary artery disease involving native coronary artery of native heart with angina pectoris (HCC)   A-fib (HCC)   Essential hypertension   Chronic diastolic CHF (congestive heart failure) (HCC)   High cholesterol   CKD (chronic kidney disease) stage 3, GFR 30-59 ml/min   Hyperglycemia   Chest pain, heart score is 5  Patient is nothing by mouth--continued DaPT, aspirin and Plavix Cardiology has been consulted Troponins are negative so far-may require further characterization based on cardiology input  Note that she's been admitted 01/2016 with unstable angina and was stented at that time  Paroxysmal a flutter/A. fib in setting of hospitalization Euvolemic chronic diastolic heart failure  Not on anticoagulation  Continue metoprolol 25 twice a day  Keep on step down for now  BNP was 84 on admission-she's not in acute heart failure clinically or by labs  HTN  Continue irbesartan 150 daily, HCTZ 12.5 daily in addition to metoprolol as above  Mild hypokalemia Chronic kidney  disease stage I to 2  Replace potassium with oral 40 mEq daily repeat labs a.m.  Baseline creatinine is anywhere from 1-1.3, hold saline and hold diuretics at this time  Chronic left lower extremity edema  No cord or palpable discomfort felt    Consultants:   Cardiology  Procedures:    none yet  Antimicrobials:   none    Subjective:  Awake alert pleasant oriented no distress Nothing by mouth for procedure Has some right shoulder pain with radiation up the neck Also complaining that her sinus issues are no better despite antibiotics and steroid Dosepak Has a mild headache   Objective: Vitals:   04/07/17 0138 04/07/17 0200 04/07/17 0300 04/07/17 0800  BP: (!) 111/93 (!) 114/59 109/65 118/62  Pulse: 69 72 78 73  Resp: 19 13 19 12   Temp: 97.6 F (36.4 C)   (!) 97.5 F (36.4 C)  TempSrc: Oral     SpO2: 97% 97% 94% 98%  Weight: 100.2 kg (220 lb 12.8 oz)     Height: 5\' 6"  (1.676 m)      No intake or output data in the 24 hours ending 04/07/17 1025 Filed Weights   04/07/17 0138  Weight: 100.2 kg (220 lb 12.8 oz)    Examination:  Alert pleasant oriented Younger than stated age No JVD No bruit ? Murmur holosystolic Abdomen soft no rebound no guarding No neurological deficits noted grossly on exam--able to lift all 4 limbs off the bed comfortably Euthymic No lower extremity rash however left and ankle is larger than the right   Data Reviewed: I have personally reviewed following labs and  imaging studies  CBC:  Recent Labs Lab 04/06/17 1610  WBC 6.2  HGB 11.6*  HCT 35.0*  MCV 83.5  PLT 244   Basic Metabolic Panel:  Recent Labs Lab 04/06/17 1610  NA 139  K 3.2*  CL 101  CO2 25  GLUCOSE 122*  BUN 22*  CREATININE 1.26*  CALCIUM 9.2   GFR: Estimated Creatinine Clearance: 44 mL/min (A) (by C-G formula based on SCr of 1.26 mg/dL (H)). Liver Function Tests: No results for input(s): AST, ALT, ALKPHOS, BILITOT, PROT, ALBUMIN in the last 168  hours. No results for input(s): LIPASE, AMYLASE in the last 168 hours. No results for input(s): AMMONIA in the last 168 hours. Coagulation Profile: No results for input(s): INR, PROTIME in the last 168 hours. Cardiac Enzymes:  Recent Labs Lab 04/07/17 0314 04/07/17 0746  TROPONINI <0.03 <0.03   BNP (last 3 results) No results for input(s): PROBNP in the last 8760 hours. HbA1C: No results for input(s): HGBA1C in the last 72 hours. CBG: No results for input(s): GLUCAP in the last 168 hours. Lipid Profile: No results for input(s): CHOL, HDL, LDLCALC, TRIG, CHOLHDL, LDLDIRECT in the last 72 hours. Thyroid Function Tests: No results for input(s): TSH, T4TOTAL, FREET4, T3FREE, THYROIDAB in the last 72 hours. Anemia Panel: No results for input(s): VITAMINB12, FOLATE, FERRITIN, TIBC, IRON, RETICCTPCT in the last 72 hours. Urine analysis:    Component Value Date/Time   COLORURINE YELLOW 01/09/2017 0300   APPEARANCEUR CLEAR 01/09/2017 0300   LABSPEC 1.008 01/09/2017 0300   PHURINE 6.0 01/09/2017 0300   GLUCOSEU NEGATIVE 01/09/2017 0300   HGBUR NEGATIVE 01/09/2017 0300   BILIRUBINUR NEGATIVE 01/09/2017 0300   BILIRUBINUR neg 12/02/2013 2147   KETONESUR NEGATIVE 01/09/2017 0300   PROTEINUR NEGATIVE 01/09/2017 0300   UROBILINOGEN 0.2 12/02/2013 2147   UROBILINOGEN 0.2 03/26/2013 1256   NITRITE NEGATIVE 01/09/2017 0300   LEUKOCYTESUR NEGATIVE 01/09/2017 0300   Sepsis Labs: @LABRCNTIP (procalcitonin:4,lacticidven:4)  )No results found for this or any previous visit (from the past 240 hour(s)).    Radiology Studies: Dg Chest 2 View  Result Date: 04/06/2017 CLINICAL DATA:  81 y/o  F; chest pain. EXAM: CHEST  2 VIEW COMPARISON:  12/29/2015 chest radiograph FINDINGS: Stable normal cardiac silhouette. Aortic atherosclerosis with calcification. Clear lungs. No pleural effusion or pneumothorax. Mild multilevel degenerative changes of the thoracic spine. IMPRESSION: No active  cardiopulmonary disease. Electronically Signed   By: Mitzi Hansen M.D.   On: 04/06/2017 17:05    Scheduled Meds: . aspirin EC  81 mg Oral Daily  . clopidogrel  75 mg Oral Daily  . enoxaparin (LOVENOX) injection  40 mg Subcutaneous Daily  . fluticasone  1 spray Each Nare Daily  . irbesartan  150 mg Oral Daily   And  . hydrochlorothiazide  12.5 mg Oral Daily  . loratadine  10 mg Oral Daily  . metoprolol tartrate  25 mg Oral BID  . pantoprazole  40 mg Oral Daily  . potassium chloride SA  20 mEq Oral Daily  . pregabalin  75 mg Oral QHS   Continuous Infusions:   LOS: 0 days    Time spent: 75    Pleas Koch, MD [facility] ([facility]   If 7PM-7AM, please contact night-coverage [email or web address] Password [email or web address] 04/07/2017, 10:25 AM

## 2017-04-07 NOTE — H&P (View-Only) (Signed)
Cardiology Consultation:   Patient ID: [name]; [id number]; [birth year]   Admit date: 04/06/2017 Date of Consult: 04/07/2017  Primary Care Provider: Andi Devon, MD Primary Cardiologist: Dr. Katrinka Blazing    Patient Profile:   [name] is a 81 y.o. female with a hx of  CAD (s/p DES to LAD 2006, DES to Cx 2014, angiosculpt scoring balloon/DES to LAD 2016, DES to RCA 01/2016), chronic diastolic CHF, HTN, paroxysmal atrial fib/flutter (not on anticoagulation), hyperlipidemia, pre-diabetes, chronic LEE (L>R), fibromyalgia, anxiety, remote h/o stomach ulcers who is being seen today for the evaluation of chest pain at the request of Dr. Mahala Menghini.   Last echo 12/2015 showed LVEF of 55-60%, [name] pressure of 55-60%.   Last stress test 07/2016 is low risk for SSCP and bilateral jaw pain.   History of Present Illness:   [name] treated with antibiotics and prednisone 2 weeks ago for possible bronchitis. She felt lack of energy since then however worsened in past 2 days with exertional dyspnea. She was unable to walk from one room to another which was unusual for her. She also had a few episode of chest tightness. She took sublingual nitroglycerin x 1 with improved symptoms. Yesterday while at church she was extremely fatigued and tired with shortness of breath and right-sided chest pressure. She needed to take multiple stop/break to get energy back. Due to waxing and waning symptoms she came to ER for further evaluation and admitted for rule out.  Point-of-care troponin negative. Troponin I less than 0.03 x 2. BNP normal. Serum creatinine 1.26. D-dimer normal. Chest x-ray without acute cardiopulmonary disease. Preliminary result of lower extremity Doppler negative for DVT.EKG this morning shows sinus rhythm with T-wave inversion anterior laterally which is similar to prior EKG of 07/05/16 and 10/04/16- personally reviewed.  She is chest pain-free since admission however did not  ambulated. Her symptoms is somewhat similar to prior angina when she had a stent placement however no radiation to jaw. She denies orthopnea, PND, syncope, melena or blood in her stool or urine. He still has intermittent dry cough with clear phlegm.  Past Medical History:  Diagnosis Date  . Anxiety   . Arthritis    Multiple joints  . Chronic diastolic CHF (congestive heart failure) (HCC) 01/16/2016  . Coronary artery disease    a. s/p DES to LAD 2006. b. DES to Cx 2014. c. angiosculpt scoring balloon/DES to LAD 2016. d. DES to RCA 12/2015.  . Fibromyalgia   . GERD (gastroesophageal reflux disease)   . High cholesterol   . History of stomach ulcers    Remotely  . Hypertension   . Lower extremity edema    a. h/o chronic edema L>R.  [name] Neuropathy   . Osteoporosis   . PAF (paroxysmal atrial fibrillation) (HCC)    a. 2012 - H/P says atrial flutter, D/c summary says atrial fib - was tx with IV amiodarone, only had a few hours of arrhythmias, no clinical recurrence as of 2017.  [name] Ventricular hypertrophy   . Vertigo 08/27/2006    Past Surgical History:  Procedure Laterality Date  . ABDOMINAL HYSTERECTOMY  1970's  . BREAST BIOPSY Right 1970's  . BREAST LUMPECTOMY Right 1970's   benign   . CARDIAC CATHETERIZATION  10/2006   [name] 08/03/2007 (01/27/2013)  . CARDIAC CATHETERIZATION N/A 01/01/2016   Procedure: Left Heart Cath and Coronary Angiography;  Surgeon: Kathleene Hazel, MD;  Location: [facility] INVASIVE CV LAB;  Service: Cardiovascular;  Laterality: N/A;  .  CARDIAC CATHETERIZATION N/A 01/01/2016   Procedure: Coronary Stent Intervention;  Surgeon: Kathleene Hazel, MD;  Location: [facility] INVASIVE CV LAB;  Service: Cardiovascular;  Laterality: N/A;  . CORONARY ANGIOPLASTY WITH STENT PLACEMENT  06/2005; 01/27/2013    2.515 mm Cypher stent to LAD in 2006; 2.75 x 12 Promus DES to the circumflex 2014    . DIAGNOSTIC LAPAROSCOPY  2000   lap abdominal lysis of adhesions  .  ESOPHAGOGASTRODUODENOSCOPY N/A 11/10/2014   Procedure: ESOPHAGOGASTRODUODENOSCOPY (EGD);  Surgeon: Jeani Hawking, MD;  Location: [facility] ENDOSCOPY;  Service: Endoscopy;  Laterality: N/A;  . HERNIA REPAIR    . LAPAROSCOPIC SALPINGO OOPHERECTOMY  2000  . LAPAROTOMY     "day after oophorectomy; had bowel obstruction" (01/27/2013)  . LEFT HEART CATHETERIZATION WITH CORONARY ANGIOGRAM N/A 01/27/2013   Procedure: LEFT HEART CATHETERIZATION WITH CORONARY ANGIOGRAM;  Surgeon: Corky Crafts, MD;  Location: [facility] CATH LAB;  Service: Cardiovascular;  Laterality: N/A;  . LEFT HEART CATHETERIZATION WITH CORONARY ANGIOGRAM N/A 11/10/2014   Procedure: LEFT HEART CATHETERIZATION WITH CORONARY ANGIOGRAM;  Surgeon: Lennette Bihari, MD; left main okay, LAD 75% ISR, FFR 0.75, s/p Angiosculpt scoring balloon and 2.7518 mm Resolute DES, CFX 70%, stent patent, RCA 50%  . TONSILLECTOMY AND ADENOIDECTOMY  ~ 1952     Inpatient Medications: Scheduled Meds: . aspirin EC  81 mg Oral Daily  . clopidogrel  75 mg Oral Daily  . enoxaparin (LOVENOX) injection  40 mg Subcutaneous Daily  . fluticasone  1 spray Each Nare Daily  . irbesartan  150 mg Oral Daily   And  . hydrochlorothiazide  12.5 mg Oral Daily  . loratadine  10 mg Oral Daily  . metoprolol tartrate  25 mg Oral BID  . pantoprazole  40 mg Oral Daily  . potassium chloride  40 mEq Oral Daily  . pregabalin  75 mg Oral QHS   Continuous Infusions:  PRN Meds: acetaminophen, ALPRAZolam, gi cocktail, morphine injection, ondansetron (ZOFRAN) IV  Allergies:    Allergies  Allergen Reactions  . Adhesive [Tape] Itching and Rash  . Crestor [Rosuvastatin Calcium]     Body myalgias on 10mg  twice weekly  . Statins     Atorvastatin 40mg , 20mg , and 10mg , rosuvastatin, simvastatin - myalgias; pitavastatin 2mg   "feels bad". Tolerating Crestor 10mg  4x/week as of 2017  . Codeine Itching    Tolerates low dose of norco  . Elavil [Amitriptyline] Other (See Comments)     Dissociation    Social History:   Social History   Social History  . Marital status: Married    Spouse name: N/A  . Number of children: 3  . Years of education: masters   Occupational History  . retired    Social History Main Topics  . Smoking status: Former Smoker    Packs/day: 0.12    Years: 36.00    Types: Cigarettes    Quit date: 12/02/1991  . Smokeless tobacco: Never Used  . Alcohol use No  . Drug use: No  . Sexual activity: Not Currently    Birth control/ protection: Surgical   Other Topics Concern  . Not on file   Social History Narrative  . No narrative on file    Family History:   Family History  Problem Relation Age of Onset  . Stroke Mother   . Heart attack Father      ROS:  Please see the history of present illness.  ROS All other ROS reviewed and negative.     Physical  Exam/Data:   Vitals:   04/07/17 0138 04/07/17 0200 04/07/17 0300 04/07/17 0800  BP: (!) 111/93 (!) 114/59 109/65 118/62  Pulse: 69 72 78 73  Resp: 19 13 19 12   Temp: 97.6 F (36.4 C)   (!) 97.5 F (36.4 C)  TempSrc: Oral     SpO2: 97% 97% 94% 98%  Weight: 220 lb 12.8 oz (100.2 kg)     Height: 5\' 6"  (1.676 m)      No intake or output data in the 24 hours ending 04/07/17 1105 Filed Weights   04/07/17 0138  Weight: 220 lb 12.8 oz (100.2 kg)   Body mass index is 35.64 kg/m.  General:  Well nourished, well developed, in no acute distress HEENT: normal Lymph: no adenopathy Neck: no JVD Endocrine:  No thryomegaly Vascular: No carotid bruits; FA pulses 2+ bilaterally without bruits  Cardiac:  normal S1, S2; RRR; no murmur  Lungs:  clear to auscultation bilaterally, no wheezing, rhonchi or rales  Abd: soft, nontender, no hepatomegaly  Ext: LE edema 1+ (L > R) Musculoskeletal:  No deformities, BUE and BLE strength normal and equal Skin: warm and dry  Neuro:  CNs 2-12 intact, no focal abnormalities noted Psych:  Normal affect   Relevant CV Studies: Cath  01/2016 Conclusion    1st Diag lesion, 70% stenosed.  Prox LAD to Mid LAD lesion, 30% stenosed.  Dist LAD lesion, 10% stenosed. The lesion was previously treated with a drug-eluting stent one to two years ago.  Ost Cx lesion, 60% stenosed.  LM lesion, 20% stenosed.  Mid RCA lesion, 40% stenosed.  Ost RCA to Prox RCA lesion, 50% stenosed.  Ost RCA lesion, 90% stenosed. Post intervention, there is a 0% residual stenosis.  The left ventricular systolic function is normal.   1. Triple vessel CAD with unstable angina. Patent stents mid LAD and Circumflex 2. Severe stenosis ostium RCA.  3. Normal LV systolic function.  4. Successful PTCA/DES x 1 ostium RCA  Recommendations: Will continue ASA, Plavix for at least one year.   Diagnostic Diagram       Post-Intervention Diagram           Laboratory Data:  Chemistry Recent Labs Lab 04/06/17 1610  NA 139  K 3.2*  CL 101  CO2 25  GLUCOSE 122*  BUN 22*  CREATININE 1.26*  CALCIUM 9.2  GFRNONAA 40*  GFRAA 46*  ANIONGAP 13    No results for input(s): PROT, ALBUMIN, AST, ALT, ALKPHOS, BILITOT in the last 168 hours. Hematology Recent Labs Lab 04/06/17 1610  WBC 6.2  RBC 4.19  HGB 11.6*  HCT 35.0*  MCV 83.5  MCH 27.7  MCHC 33.1  RDW 15.2  PLT 244   Cardiac Enzymes Recent Labs Lab 04/07/17 0314 04/07/17 0746  TROPONINI <0.03 <0.03    Recent Labs Lab 04/06/17 1631  TROPIPOC 0.00    BNP Recent Labs Lab 04/06/17 1725  BNP 84.7    DDimer  Recent Labs Lab 04/06/17 1725  DDIMER 0.39    Radiology/Studies:  Dg Chest 2 View  Result Date: 04/06/2017 CLINICAL DATA:  81 y/o  F; chest pain. EXAM: CHEST  2 VIEW COMPARISON:  12/29/2015 chest radiograph FINDINGS: Stable normal cardiac silhouette. Aortic atherosclerosis with calcification. Clear lungs. No pleural effusion or pneumothorax. Mild multilevel degenerative changes of the thoracic spine. IMPRESSION: No active cardiopulmonary disease.  Electronically Signed   By: Mitzi HansenLance  Furusawa-Stratton M.D.   On: 04/06/2017 17:05    Assessment and Plan:  1. Chest pain  - Has both typical and atypical features. However concerning for angina.  Her symptoms occurs with exertion and relieves with rest. Responsive to sublingual nitroglycerin. Her symptoms somewhat similar to prior angina when she had a stent placement. EKG without acute changes. Troponin negative.BNP normal. D-dimer negative. CXr clear.  - Will do cath today. Patient is NPO.   2. CAD (s/p DES to LAD 2006, DES to Cx 2014, angiosculpt scoring balloon/DES to LAD 2016, DES to RCA 01/2016) - Last report of cath 01/2016 as above. Low risk myoview 07/2016. - Continue ASA, plavix, BB and ARB  3. HLD - No results found for requested labs within last 8760 hours.  Undergoing evaluation for ORION Trial.   4. LE edema L > R - D-dimer negative. No DVT on preliminary report on LE venous doppler.   5. HTN - Stable and well controlled on current medications.    Catherine Pont, [name]  04/07/2017 11:05 AM   Patient seen and examined and history reviewed. Agree with above findings and plan. [name] with extensive history of CAD s/p most recent stenting of the ostium of the RCA in April 2017. Was seen by primary care 3 weeks ago and treated for sinus and bronchitis with antibiotics and steroids. Now presents with progressive symptoms of dyspnea on exertion and chest tightness on exertion. Relief with sl Ntg. Some cough but sputum changed from thick yellow to clear. On exam she has no JVD. Lungs are clear. No gallop, murmur or click. Some L>R edema that is chronic.  Troponin, BNP, D-dimer, LE venous doppler all negative. Ecg without acute change. CXR is clear. At this point I do not have a clear reason for her progressive symptoms. No venous embolic disease. No CHF. Bronchitis appropriately treated and improving. No wheezing. I am concerned she may have progressive CAD or restenosis.  I would recommend cardiac cath given the rather dramatic limitation she has developed in the last couple of days. I spoke to the patient and she is agreeable. The procedure and risks were reviewed including but not limited to death, myocardial infarction, stroke, arrythmias, bleeding, transfusion, emergency surgery, dye allergy, or renal dysfunction. The patient voices understanding and is agreeable to proceed. We will see if this can be scheduled for today. She is NPO.   [name], MDFACC 04/07/2017 11:58 AM

## 2017-04-07 NOTE — ED Notes (Signed)
Attempted to call report x 1  

## 2017-04-07 NOTE — Progress Notes (Addendum)
Attempted to obtain report x1 from ED RN at [phone].

## 2017-04-08 ENCOUNTER — Encounter (HOSPITAL_COMMUNITY): Payer: Self-pay | Admitting: Interventional Cardiology

## 2017-04-08 DIAGNOSIS — I10 Essential (primary) hypertension: Secondary | ICD-10-CM | POA: Diagnosis not present

## 2017-04-08 DIAGNOSIS — R0609 Other forms of dyspnea: Secondary | ICD-10-CM | POA: Diagnosis not present

## 2017-04-08 DIAGNOSIS — I25119 Atherosclerotic heart disease of native coronary artery with unspecified angina pectoris: Secondary | ICD-10-CM | POA: Diagnosis not present

## 2017-04-08 LAB — BASIC METABOLIC PANEL
Anion gap: 8 (ref 5–15)
BUN: 12 mg/dL (ref 6–20)
CALCIUM: 9.2 mg/dL (ref 8.9–10.3)
CO2: 27 mmol/L (ref 22–32)
CREATININE: 0.99 mg/dL (ref 0.44–1.00)
Chloride: 108 mmol/L (ref 101–111)
GFR calc Af Amer: >60 mL/min (ref >60)
GFR, EST NON AFRICAN AMERICAN: 53 mL/min — AB (ref >60)
Glucose, Bld: 101 mg/dL — ABNORMAL HIGH (ref 65–99)
Potassium: 3.6 mmol/L (ref 3.5–5.1)
SODIUM: 143 mmol/L (ref 135–145)

## 2017-04-08 LAB — CBC
HEMATOCRIT: 32.4 % — AB (ref 36.0–46.0)
Hemoglobin: 10.4 g/dL — ABNORMAL LOW (ref 12.0–15.0)
MCH: 27.4 pg (ref 26.0–34.0)
MCHC: 32.1 g/dL (ref 30.0–36.0)
MCV: 85.5 fL (ref 78.0–100.0)
Platelets: 254 10*3/uL (ref 150–400)
RBC: 3.79 MIL/uL — ABNORMAL LOW (ref 3.87–5.11)
RDW: 15.6 % — AB (ref 11.5–15.5)
WBC: 4.1 10*3/uL (ref 4.0–10.5)

## 2017-04-08 NOTE — Progress Notes (Signed)
[name] to be D/C'd Home per MD order. Discussed with the patient and all questions fully answered.    VVS, Skin clean, dry and intact without evidence of skin break down, no evidence of skin tears noted.  IV catheter discontinued intact. Site without signs and symptoms of complications. Dressing and pressure applied.  An After Visit Summary was printed and given to the patient.  Patient escorted via WC, and D/C home via private auto.  Kai LevinsJacobs, Arfa Lamarca N  04/08/2017 12:15 PM

## 2017-04-08 NOTE — Discharge Summary (Addendum)
Physician Discharge Summary  [name] MRN:[id number] DOB: 08/04/1936 DOA: 04/06/2017  PCP: Shelton, Kimberly, MD  Admit date: 04/06/2017 Discharge date: 04/08/2017  Time spent: 45 minutes  Recommendations for Outpatient Follow-up:  1. Patient will need a be met i as well as a CBC in about one week 2. Patient will also need follow-up with rheumatology with regards to her noncardiac chest pain-might recommend increasing Lyrica as an outpatient but would defer to them 3. Will need also consideration of addition of beta blocker versus nitrate as an outpatient-consider also Norvasc?   she might have microangiopathic angina 4. Patient needs consideration for resuming NOAC  Discharge Diagnoses:  Principal Problem:   Coronary artery disease involving native coronary artery of native heart with angina pectoris (HCC) Active Problems:   A-fib (HCC)   Essential hypertension   Chronic diastolic CHF (congestive heart failure) (HCC)   High cholesterol   CKD (chronic kidney disease) stage 3, GFR 30-59 ml/min   Hyperglycemia   Discharge Condition: good  Diet recommendation: hh low salt  Filed Weights   04/07/17 0138  Weight: 100.2 kg (220 lb 12.8 oz)    History of present illness:   81-year-old female CAD H/o-last 2017, nuclear test 07/2016 no inducible ischemia             DES-->LAD 2006, DE S-->CX 2014, DES to LAD 2016, DES to RCA 12/2015 Chronic diastolic CHF HTN Atrial fibrillation flutter-self resolve 2012 Chronic left lower extremity edema for many years Prior stomach ulcers  Admitted with chest pain 04/06/17 States that she's had respiratory and sinus-like issues over the past 3 weeks and has right shoulder and back discomfort which she relates to be from this She recently had a knee injection which has helped her with mobility  Point-of-care troponin so far negative, EKG is unchanged Cardiology consulted given insidious and chronic CAD history  Hospital Course:    Chest pain, heart score is 5  Patient is nothing by mouth--continued DaPT, aspirin and Plavix Cardiology saw the patient in consult and recommended heart catheterization results of which are as below No overt changes were made to patient's medications however was recommended to consider nitrate or beta blocker for presumed microangiopathic angina   Paroxysmal a flutter/A. fib in setting of hospitalization Euvolemic chronic diastolic heart failure             Not on anticoagulation             Continue metoprolol 25 twice a day             BNP was 84 on admission-she's not in acute heart failure clinically or by labs   HTN             Continue irbesartan 150 daily, HCTZ 12.5 daily in addition to metoprolol as above   Mild hypokalemia Chronic kidney disease stage I to 2             Replace potassium with oral 40 mEq daily repeat labs a.m.             Baseline creatinine is anywhere from 1-1.3, hold saline and hold diuretics at this time  Demadex was resumed as an outpatient on discharge and she will need labs in 1 week   Chronic left lower extremity edema             No cord or palpable discomfort felt     Procedures: Conclusion   Multivessel coronary disease with previous stents all patent: ostial   RCA stent May 2017, mid LAD stent treated with restenting in 2016, and mid circumflex stent 2014.  Ostial circumflex stenosis in the 75-80% range, appearing slightly worse than on last angiogram in May 2017. Disease has been present since at least 2014.  Ostial LAD, less than equal to 40%, unchanged from 2017.  Widely patent left main.  Normal left ventricular function with EF 60% and normal filling pressures.   RECOMMENDATIONS:    Unclear if presenting symptoms are related to her current coronary anatomy. PCI on the ostial circumflex would threaten the LAD. I do believe there has been some progression in the ostial circumflex compared to 15 months ago. My approach for the time  being would be medical therapy since this disease is not new.  Multiple passes to achieve arterial access in the right femoral. Relatively high stick. Monitor for bleeding complications.     Consultations:   cardiology  Discharge Exam: Vitals:   04/08/17 0824 04/08/17 1006  BP:  (!) 151/100  Pulse: 88 84  Resp: 16   Temp:      General: Alert oriented pleasant had a full meal Cardiovascular:  S1-S2 no murmur rub or gallop, no chest pain, no tachycardia noted on telemetry Respiratory:  clinically clear no added sound  Discharge Instructions   Discharge Instructions    Diet - low sodium heart healthy    Complete by:  As directed    Discharge instructions    Complete by:  As directed    No changes to your meds Please follow up with your primary care physician regarding lab work in about one week's time You will also need to have close follow-up with Dr. Tamala Julian of cardiology with regards to your chest pain and what other etiologies might be entertained I would encourage also follow-up with your rheumatologist as an outpatient for up titration of Lyrica in addition of other medications for her fibromyalgia   Increase activity slowly    Complete by:  As directed      Current Discharge Medication List    CONTINUE these medications which have NOT CHANGED   Details  acetaminophen (TYLENOL) 500 MG tablet Take 500 mg by mouth daily as needed (pain).    ALPRAZolam (XANAX) 0.25 MG tablet Take 0.25 mg by mouth 2 (two) times daily as needed for anxiety. anxiety    aspirin EC 81 MG tablet Take 81 mg by mouth daily.      clopidogrel (PLAVIX) 75 MG tablet take 1 tablet by mouth once daily Qty: 30 tablet, Refills: 1    Cyanocobalamin (B-12 PO) Take 1 tablet by mouth 3 (three) times a week.    Diclofenac Sodium (VOLTAREN EX) Apply 1 application topically as needed (Pain).    Diphenhydramine-APAP, sleep, (TYLENOL PM EXTRA STRENGTH PO) Take 0.5 tablets by mouth at bedtime as needed.  For sleep    loratadine (CLARITIN) 10 MG tablet Take 10 mg by mouth daily. Reported on 12/29/2015    LYRICA 75 MG capsule Take 75 mg by mouth at bedtime.     metoprolol tartrate (LOPRESSOR) 25 MG tablet take 1 tablet by mouth twice a day Qty: 60 tablet, Refills: 1    nitroGLYCERIN (NITROSTAT) 0.4 MG SL tablet Place 1 tablet (0.4 mg total) under the tongue every 5 (five) minutes as needed for chest pain (CP or SOB). Qty: 25 tablet, Refills: 2    pantoprazole (PROTONIX) 40 MG tablet take 1 tablet by mouth once daily AT 6 AM Qty: 30 tablet, Refills:  8    potassium chloride SA (K-DUR,KLOR-CON) 20 MEQ tablet take 1 tablet by mouth once daily Qty: 90 tablet, Refills: 1   Associated Diagnoses: Chronic diastolic CHF (congestive heart failure) (HCC)    Probiotic Product (ALIGN PO) Take 1 tablet by mouth daily.    torsemide (DEMADEX) 20 MG tablet Take 20 mg by mouth daily. Take 1 to 2 tablets by mouth daily as needed for swelling.    trolamine salicylate (ASPERCREME) 10 % cream Apply 1 application topically as needed for muscle pain.    valsartan-hydrochlorothiazide (DIOVAN-HCT) 160-12.5 MG per tablet Take 1 tablet by mouth daily.     Vitamin D, Ergocalciferol, (DRISDOL) 50000 UNITS CAPS capsule Take 50,000 Units by mouth every 7 (seven) days. Take on Mondays    albuterol (PROVENTIL HFA;VENTOLIN HFA) 108 (90 Base) MCG/ACT inhaler Inhale 2 puffs into the lungs as directed.    fluticasone (FLONASE) 50 MCG/ACT nasal spray Place 2 sprays into both nostrils daily. Qty: 1 g, Refills: 2       Allergies  Allergen Reactions  . Adhesive [Tape] Itching and Rash  . Crestor [Rosuvastatin Calcium]     Body myalgias on 52m twice weekly  . Statins     Atorvastatin 475m 2063mand 53m76mosuvastatin, simvastatin - myalgias; pitavastatin 2mg 88meels bad". Tolerating Crestor 53mg 40meek as of 2017  . Codeine Itching    Tolerates low dose of norco  . Elavil [Amitriptyline] Other (See Comments)     Dissociation   Follow-up Information    [facility] on 04/24/2017.   Specialties:  Cardiology, Radiology Why:  _0  am post hospital  Contact information: [id number] [phone]      The results of significant diagnostics from this hospitalization (including imaging, microbiology, ancillary and laboratory) are listed below for reference.    Significant Diagnostic Studies: Dg Chest 2 View  Result Date: 04/06/2017 CLINICAL DATA:  80 y/o101F; chest pain. EXAM: CHEST  2 VIEW COMPARISON:  12/29/2015 chest radiograph FINDINGS: Stable normal cardiac silhouette. Aortic atherosclerosis with calcification. Clear lungs. No pleural effusion or pneumothorax. Mild multilevel degenerative changes of the thoracic spine. IMPRESSION: No active cardiopulmonary disease. Electronically Signed   By: Lance Kristine Garbe  On: 04/06/2017 17:05    Microbiology: No results found for this or any previous visit (from the past 240 hour(s)).   Labs: Basic Metabolic Panel:  Recent Labs Lab 04/06/17 1610 04/07/17 1222 04/08/17 0242  NA 139 142 143  K 3.2* 3.2* 3.6  CL 101 103 108  CO2 _1 GLUCOSE 122* 119* 101*  BUN 22* 17 12  CREATININE 1.26* 1.07* 0.99  CALCIUM 9.2 9.4 9.2   Liver Function Tests: No results for input(s): AST, ALT, ALKPHOS, BILITOT, PROT, ALBUMIN in the last 168 hours. No results for input(s): LIPASE, AMYLASE in the last 168 hours. No results for input(s): AMMONIA in the last 168 hours. CBC:  Recent Labs Lab 04/06/17 1610 04/08/17 0242  WBC 6.2 4.1  HGB 11.6* 10.4*  HCT 35.0* 32.4*  MCV 83.5 85.5  PLT 244 254   Cardiac Enzymes:  Recent Labs Lab 04/07/17 0314 04/07/17 0746 04/07/17 1600  TROPONINI <0.03 <0.03 <0.03   BNP: BNP (last 3 results)  Recent Labs  04/06/17 1725  BNP 84.7    ProBNP (last 3 results) No results for input(s): PROBNP in the last 8760 hours.  CBG: No results  for  input(s): GLUCAP in the last 168 hours.     Signed:  SAMTANI, JAI-GURMUKH MD   [facility] 04/08/2017, 11:17 AM   

## 2017-04-08 NOTE — Progress Notes (Signed)
Progress Note  Patient Name: Catherine HurterCarolyn [name] Witthuhn Date of Encounter: 04/08/2017  Primary Cardiologist: Dr. Katrinka BlazingSmith  Subjective  Still have chest congestion with cough. No dyspnea or chest pain.   Inpatient Medications    Scheduled Meds: . aspirin  81 mg Oral Daily  . clopidogrel  75 mg Oral Daily  . enoxaparin (LOVENOX) injection  40 mg Subcutaneous Q24H  . fluticasone  1 spray Each Nare Daily  . irbesartan  150 mg Oral Daily   And  . hydrochlorothiazide  12.5 mg Oral Daily  . loratadine  10 mg Oral Daily  . metoprolol tartrate  25 mg Oral BID  . pantoprazole  40 mg Oral Daily  . polyethylene glycol  17 g Oral Daily  . potassium chloride  40 mEq Oral Daily  . pregabalin  75 mg Oral QHS  . sodium chloride flush  3 mL Intravenous Q12H   Continuous Infusions: . sodium chloride     PRN Meds: sodium chloride, acetaminophen, ALPRAZolam, gi cocktail, morphine injection, ondansetron (ZOFRAN) IV, sodium chloride flush   Vital Signs    Vitals:   04/08/17 0608 04/08/17 0800 04/08/17 0815 04/08/17 0824  BP: 123/67  125/75   Pulse: 78 75 86 88  Resp: 16 (!) 23 14 16   Temp: 98.3 F (36.8 C)  98.4 F (36.9 C)   TempSrc: Oral  Oral   SpO2: 97% 96% 97% 95%  Weight:      Height:        Intake/Output Summary (Last 24 hours) at 04/08/17 [id number] Last data filed at 04/08/17 [id number]  Gross per 24 hour  Intake              240 ml  Output              700 ml  Net             -460 ml   Filed Weights   04/07/17 0138  Weight: 220 lb 12.8 oz (100.2 kg)    Telemetry    SR - Personally Reviewed  ECG    None today - Personally Reviewed  Physical Exam   GEN: No acute distress.   Neck: No JVD Cardiac: RRR, no murmurs, rubs, or gallops. Mild soreness and hematoma to right groin cath site. L > R chronic LE edema.  Respiratory: Clear to auscultation bilaterally. GI: Soft, nontender, non-distended  MS: No edema; No deformity. Neuro:  Nonfocal  Psych: Normal affect   Labs      Chemistry Recent Labs Lab 04/06/17 1610 04/07/17 1222 04/08/17 0242  NA 139 142 143  K 3.2* 3.2* 3.6  CL 101 103 108  CO2 25 31 27   GLUCOSE 122* 119* 101*  BUN 22* 17 12  CREATININE 1.26* 1.07* 0.99  CALCIUM 9.2 9.4 9.2  GFRNONAA 40* 48* 53*  GFRAA 46* 56* >60  ANIONGAP 13 8 8      Hematology Recent Labs Lab 04/06/17 1610 04/08/17 0242  WBC 6.2 4.1  RBC 4.19 3.79*  HGB 11.6* 10.4*  HCT 35.0* 32.4*  MCV 83.5 85.5  MCH 27.7 27.4  MCHC 33.1 32.1  RDW 15.2 15.6*  PLT 244 254    Cardiac Enzymes Recent Labs Lab 04/07/17 0314 04/07/17 0746 04/07/17 1600  TROPONINI <0.03 <0.03 <0.03    Recent Labs Lab 04/06/17 1631  TROPIPOC 0.00     BNP Recent Labs Lab 04/06/17 1725  BNP 84.7     DDimer  Recent Labs Lab 04/06/17 1725  DDIMER  0.39     Radiology    Dg Chest 2 View  Result Date: 04/06/2017 CLINICAL DATA:  81 y/o  F; chest pain. EXAM: CHEST  2 VIEW COMPARISON:  12/29/2015 chest radiograph FINDINGS: Stable normal cardiac silhouette. Aortic atherosclerosis with calcification. Clear lungs. No pleural effusion or pneumothorax. Mild multilevel degenerative changes of the thoracic spine. IMPRESSION: No active cardiopulmonary disease. Electronically Signed   By: Mitzi Hansen M.D.   On: 04/06/2017 17:05    Cardiac Studies   LEFT HEART CATH AND CORONARY ANGIOGRAPHY  Conclusion    Multivessel coronary disease with previous stents all patent: ostial RCA stent May 2017, mid LAD stent treated with restenting in 2016, and mid circumflex stent 2014.  Ostial circumflex stenosis in the 75-80% range, appearing slightly worse than on last angiogram in May 2017. Disease has been present since at least 2014.  Ostial LAD, less than equal to 40%, unchanged from 2017.  Widely patent left main.  Normal left ventricular function with EF 60% and normal filling pressures.  RECOMMENDATIONS:   Unclear if presenting symptoms are related to her current  coronary anatomy. PCI on the ostial circumflex would threaten the LAD. I do believe there has been some progression in the ostial circumflex compared to 15 months ago. My approach for the time being would be medical therapy since this disease is not new.  Multiple passes to achieve arterial access in the right femoral. Relatively high stick. Monitor for bleeding complications.   Diagnostic Diagram        Patient Profile     [name] is a 81 y.o. female with a hx of CAD (s/p DES to LAD 2006, DES to Cx 2014, angiosculpt scoring balloon/DES to LAD 2016, DES to RCA 01/2016), chronic diastolic CHF, HTN, paroxysmal atrial fib/flutter (not on anticoagulation), hyperlipidemia, pre-diabetes, chronic LEE (L>R), fibromyalgia, anxiety, remote h/o stomach ulcers who is consulted of chest pain at the request of Dr. Mahala Menghini.   Assessment & Plan    1. Angina pectoris with hx of CAD -cath showed previously patent stent (ostial RCA stent May 2017, mid LAD stent treated with restenting in 2016, and mid circumflex stent 2014). Ostial cirum 75-80%, slight worsen than previous angiogram. Recommended medical therapy.  - Continue current regimen. If ongoing pain after tx with possible bronchitis may consider Imdur vs up -titration of BB.   2. Bronchitis - recently treated with abx and prednisone. Still complains of chest and head congestion. Will defer to primary. As above.   3. HLD - undergoing evaluation of orion trial.   4. HTN - Stable   Dr. [place] to see.   Signed, Manson Passey, PA  04/08/2017, 9:37 AM    Patient seen and examined and history reviewed. Agree with above findings and plan. Feels well today. Still notes some dyspnea on exertion. No chest pain. Cath films reviewed. She does have moderate ostial LCx disease but I think this is unlikely to be causing her current symptoms. LV function normal with normal LVEDP. We will continue medical therapy. OK for DC from a cardiac  standpoint. Will need outpatient follow up with Dr. Katrinka Blazing.   Alandria Butkiewicz [place], MDFACC 04/08/2017 10:34 AM

## 2017-04-09 ENCOUNTER — Telehealth: Payer: Self-pay | Admitting: Interventional Cardiology

## 2017-04-09 NOTE — Telephone Encounter (Signed)
Mr.Agostinelli is calling to find out more about [name] procedure in which she had on yesterday . Please Call  Thanks

## 2017-04-09 NOTE — Telephone Encounter (Signed)
Patient aware Cath report ready for pick up.

## 2017-04-09 NOTE — Telephone Encounter (Signed)
Patient and husband called to get a further explanation of the heart cath that was done on yesterday due to some confusion of what they were told. Results of heart cath read to patient and husband.   Patient informed nurse that she did not want to wait until 04/24/17 to be seen due to her sob and did not want to see a PA. Patient was given an earlier appt to see Dr. Katrinka BlazingSmith on 04/14/17.   Patient is requesting a copy of the heart cath report. Patient advised that this would be sent to our HIM department. Patient is requesting to pick up a copy of this report from our office. Patient advised that the HIM department would let her know the protocol.

## 2017-04-10 ENCOUNTER — Other Ambulatory Visit: Payer: Self-pay

## 2017-04-10 MED ORDER — METOPROLOL TARTRATE 25 MG PO TABS: 25.0000 mg | ORAL_TABLET | Freq: Two times a day (BID) | ORAL | 9 refills | Status: DC
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
## 2017-04-14 ENCOUNTER — Ambulatory Visit (INDEPENDENT_AMBULATORY_CARE_PROVIDER_SITE_OTHER): Payer: Medicare PPO | Admitting: Interventional Cardiology

## 2017-04-14 ENCOUNTER — Encounter (INDEPENDENT_AMBULATORY_CARE_PROVIDER_SITE_OTHER): Payer: Self-pay

## 2017-04-14 ENCOUNTER — Encounter: Payer: Self-pay | Admitting: Interventional Cardiology

## 2017-04-14 VITALS — BP 156/80 | HR 80 | Ht 67.0 in | Wt 219.0 lb

## 2017-04-14 DIAGNOSIS — I480 Paroxysmal atrial fibrillation: Secondary | ICD-10-CM

## 2017-04-14 DIAGNOSIS — I25119 Atherosclerotic heart disease of native coronary artery with unspecified angina pectoris: Secondary | ICD-10-CM

## 2017-04-14 DIAGNOSIS — N183 Chronic kidney disease, stage 3 unspecified: Secondary | ICD-10-CM

## 2017-04-14 DIAGNOSIS — I5032 Chronic diastolic (congestive) heart failure: Secondary | ICD-10-CM | POA: Diagnosis not present

## 2017-04-14 DIAGNOSIS — I10 Essential (primary) hypertension: Secondary | ICD-10-CM

## 2017-04-14 DIAGNOSIS — R0600 Dyspnea, unspecified: Secondary | ICD-10-CM | POA: Diagnosis not present

## 2017-04-14 NOTE — Patient Instructions (Addendum)
Medication Instructions:  None  Labwork: None  Testing/Procedures: None  Follow-Up: Your physician recommends that you schedule a follow-up appointment in: 4-6 months with Dr. Katrinka BlazingSmith.    Any Other Special Instructions Will Be Listed Below (If Applicable).  You have been referred to Cardiac Rehab.     If you need a refill on your cardiac medications before your next appointment, please call your pharmacy.

## 2017-04-24 ENCOUNTER — Ambulatory Visit: Payer: Medicare PPO | Admitting: Cardiology

## 2017-04-25 ENCOUNTER — Other Ambulatory Visit (HOSPITAL_COMMUNITY): Payer: Self-pay

## 2017-04-25 DIAGNOSIS — I509 Heart failure, unspecified: Secondary | ICD-10-CM

## 2017-05-16 NOTE — Progress Notes (Addendum)
Office Visit Note  Patient: [name]             Date of Birth: [birth year]           MRN: [id number]             PCP: Andi Devon, MD Referring: Andi Devon, MD Visit Date: 05/28/2017 Occupation: @    Subjective:  Pain of the Right Hip   History of Present Illness: [name] is a 81 y.o. female with history of osteoarthritis, disc disease and fibromyalgia syndrome. She states she had bladder catheterization about a month ago after that she developed pain in her right hip. She continues to have pain in her right hip and difficulty walking due to that. She has some discomfort in her left knee. She continues to have some lower back discomfort. Without any radiculopathy. Of fibromyalgia is not very active currently.  Activities of Daily Living:  Patient reports morning stiffness for 5 minutes.   Patient Reports nocturnal pain.  Difficulty dressing/grooming: Denies Difficulty climbing stairs: Reports Difficulty getting out of chair: Reports Difficulty using hands for taps, buttons, cutlery, and/or writing: Reports   Review of Systems  Constitutional: Positive for fatigue. Negative for night sweats, weight gain, weight loss and weakness.  HENT: Negative for mouth sores, trouble swallowing, trouble swallowing, mouth dryness and nose dryness.   Eyes: Positive for dryness. Negative for pain, redness and visual disturbance.  Respiratory: Negative for cough, shortness of breath and difficulty breathing.   Cardiovascular: Negative for chest pain, palpitations, hypertension, irregular heartbeat and swelling in legs/feet.  Gastrointestinal: Negative for blood in stool, constipation and diarrhea.  Endocrine: Negative for increased urination.  Genitourinary: Negative for vaginal dryness.  Musculoskeletal: Positive for arthralgias, joint pain, myalgias, morning stiffness and myalgias. Negative for joint swelling, muscle weakness and muscle tenderness.    Skin: Negative for color change, rash, hair loss, skin tightness, ulcers and sensitivity to sunlight.  Allergic/Immunologic: Negative for susceptible to infections.  Neurological: Negative for dizziness, memory loss and night sweats.  Hematological: Negative for swollen glands.  Psychiatric/Behavioral: Negative for depressed mood and sleep disturbance. The patient is not nervous/anxious.     PMFS History:  Patient Active Problem List   Diagnosis Date Noted  . DDD (degenerative disc disease), lumbar 05/28/2017  . CKD (chronic kidney disease) stage 3, GFR 30-59 ml/min 04/06/2017  . Hyperglycemia 04/06/2017  . Primary osteoarthritis of both knees 11/27/2016  . Osteoarthritis of pelvis 11/27/2016  . Fibromyalgia syndrome 11/27/2016  . Other fatigue 11/27/2016  . Snoring 06/28/2016  . Chronic diastolic CHF (congestive heart failure) (HCC) 01/16/2016  . Schatzki's ring 11/11/2014  . Hiatal hernia 11/11/2014  . Essential hypertension   . Dyspnea 05/05/2014  . CAP (community acquired pneumonia) 05/02/2014  . Dysuria 01/26/2014  . Atrophic vaginitis 01/26/2014  . Hyperlipidemia 07/23/2013  . Old MI (myocardial infarction)     Class: Chronic  . Nausea and vomiting 11/13/2011  . Vertigo, benign positional 07/07/2011  . Coronary artery disease involving native coronary artery of native heart with angina pectoris (HCC) 07/07/2011  . A-fib (HCC) 07/07/2011    Past Medical History:  Diagnosis Date  . Anxiety   . Arthritis    Multiple joints  . Chronic diastolic CHF (congestive heart failure) (HCC) 01/16/2016  . Coronary artery disease    a. s/p DES to LAD 2006. b. DES to Cx 2014. c. angiosculpt scoring balloon/DES to LAD 2016. d. DES to RCA 12/2015.  . Fibromyalgia   .  GERD (gastroesophageal reflux disease)   . High cholesterol   . History of stomach ulcers    Remotely  . Hypertension   . Lower extremity edema    a. h/o chronic edema L>R.  [name] Neuropathy   . Osteoporosis   . PAF  (paroxysmal atrial fibrillation) (HCC)    a. 2012 - H/P says atrial flutter, D/c summary says atrial fib - was tx with IV amiodarone, only had a few hours of arrhythmias, no clinical recurrence as of 2017.  [name] Ventricular hypertrophy   . Vertigo 08/27/2006    Family History  Problem Relation Age of Onset  . Stroke Mother   . Heart attack Father    Past Surgical History:  Procedure Laterality Date  . ABDOMINAL HYSTERECTOMY  1970's  . BREAST BIOPSY Right 1970's  . BREAST LUMPECTOMY Right 1970's   benign   . CARDIAC CATHETERIZATION  10/2006   [name] 08/03/2007 (01/27/2013)  . CARDIAC CATHETERIZATION N/A 01/01/2016   Procedure: Left Heart Cath and Coronary Angiography;  Surgeon: Kathleene Hazel, MD;  Location: [facility] INVASIVE CV LAB;  Service: Cardiovascular;  Laterality: N/A;  . CARDIAC CATHETERIZATION N/A 01/01/2016   Procedure: Coronary Stent Intervention;  Surgeon: Kathleene Hazel, MD;  Location: [facility] INVASIVE CV LAB;  Service: Cardiovascular;  Laterality: N/A;  . CORONARY ANGIOPLASTY WITH STENT PLACEMENT  06/2005; 01/27/2013    2.515 mm Cypher stent to LAD in 2006; 2.75 x 12 Promus DES to the circumflex 2014    . DIAGNOSTIC LAPAROSCOPY  2000   lap abdominal lysis of adhesions  . ESOPHAGOGASTRODUODENOSCOPY N/A 11/10/2014   Procedure: ESOPHAGOGASTRODUODENOSCOPY (EGD);  Surgeon: Jeani Hawking, MD;  Location: [facility] ENDOSCOPY;  Service: Endoscopy;  Laterality: N/A;  . HERNIA REPAIR    . LAPAROSCOPIC SALPINGO OOPHERECTOMY  2000  . LAPAROTOMY     "day after oophorectomy; had bowel obstruction" (01/27/2013)  . LEFT HEART CATH AND CORONARY ANGIOGRAPHY N/A 04/07/2017   Procedure: LEFT HEART CATH AND CORONARY ANGIOGRAPHY;  Surgeon: Lyn Records, MD;  Location: [facility] INVASIVE CV LAB;  Service: Cardiovascular;  Laterality: N/A;  . LEFT HEART CATHETERIZATION WITH CORONARY ANGIOGRAM N/A 01/27/2013   Procedure: LEFT HEART CATHETERIZATION WITH CORONARY ANGIOGRAM;  Surgeon: Corky Crafts, MD;   Location: [facility] CATH LAB;  Service: Cardiovascular;  Laterality: N/A;  . LEFT HEART CATHETERIZATION WITH CORONARY ANGIOGRAM N/A 11/10/2014   Procedure: LEFT HEART CATHETERIZATION WITH CORONARY ANGIOGRAM;  Surgeon: Lennette Bihari, MD; left main okay, LAD 75% ISR, FFR 0.75, s/p Angiosculpt scoring balloon and 2.7518 mm Resolute DES, CFX 70%, stent patent, RCA 50%  . TONSILLECTOMY AND ADENOIDECTOMY  ~ 1   Social History   Social History Narrative  . No narrative on file     Objective: Vital Signs: BP 120/74   Pulse 74   Resp 16   Ht  (1.702 m)   Wt 220 lb (99.8 kg)   BMI 34.46 kg/m    Physical Exam  Constitutional: She is oriented to person, place, and time. She appears well-developed and well-nourished.  HENT:  Head: Normocephalic and atraumatic.  Eyes: Conjunctivae and EOM are normal.  Neck: Normal range of motion.  Cardiovascular: Normal rate, regular rhythm, normal heart sounds and intact distal pulses.   Pulmonary/Chest: Effort normal and breath sounds normal.  Abdominal: Soft. Bowel sounds are normal.  Lymphadenopathy:    She has no cervical adenopathy.  Neurological: She is alert and oriented to person, place, and time.  Skin: Skin is warm and dry. Capillary  refill takes less than 2 seconds.  Psychiatric: She has a normal mood and affect. Her behavior is normal.  Nursing note and vitals reviewed.    Musculoskeletal Exam: C-spine and thoracic spine good range of motion. She has limited painful range of motion of her lumbar spine. Shoulder joints elbow joints wrist joints, MCPs PIPs DIPs with good range of motion. She had painful range of motion of her right hip joint. Mild tenderness over right trochanteric area. Knee joints are good range of motion with some crepitus. Fibromyalgia tender points only 10 out of 18 positive.  CDAI Exam: No CDAI exam completed.    Investigation: No additional findings.   Imaging: Xr Hip Unilat W Or W/o Pelvis 2-3 Views  Right  Result Date: 05/28/2017 There is no significant hip joint narrowing. SI joint appears normal.   Speciality Comments: No specialty comments available.    Procedures:  Large Joint Inj Date/Time: 05/28/2017 9:23 AM Performed by: Pollyann Savoy Authorized by: Pollyann Savoy   Consent Given by:  Patient Site marked: the procedure site was marked   Timeout: prior to procedure the correct patient, procedure, and site was verified   Indications:  Pain Location:  Hip Site:  R greater trochanter Prep: patient was prepped and draped in usual sterile fashion   Needle Size:  27 G Needle Length:  1.5 inches Approach:  Lateral Ultrasound Guidance: No   Fluoroscopic Guidance: No   Arthrogram: No   Medications:  40 mg triamcinolone acetonide 40 MG/ML; 1.5 mL lidocaine 1 % Aspiration Attempted: No   Aspirate amount (mL):  0 Patient tolerance:  Patient tolerated the procedure well with no immediate complications   Allergies: Adhesive [tape]; Crestor [rosuvastatin calcium]; Statins; Codeine; and Elavil [amitriptyline]   Assessment / Plan:     Visit Diagnoses:   Pain in right hip joint: Patient is having discomfort with range of motion. X-ray of the right hip 2 views today her option. Which showed unremarkable x-ray of the hip joint.  Trochanteric bursitis right. After informed consent was obtained right trochanter was injected with cortisone as described above. I advised her in case her symptoms continue she should contact us for possible evaluation of pain from her lumbar spine.  Osteoarthritis of both knee joints. She had Euflex injections back in August 2017 with good response.  DDD lumbar spine: She has ongoing pain and discomfort.  Fibromyalgia syndrome: She continues to have some pain and discomfort although the pain is manageable currently.  Fatigue patient reports increased fatigue recently.  Her other medical problems include:  History of coronary artery disease  with MI and a stent in June 2014  History of hypercholesterolemia  History of hypertension   Orders: Orders Placed This Encounter  Procedures  . XR HIP UNILAT W OR W/O PELVIS 2-3 VIEWS RIGHT   Meds ordered this encounter  Medications  . diclofenac sodium (VOLTAREN) 1 % GEL    Sig: Apply 4 g topically 4 (four) times daily.    Dispense:  3 g    Refill:  3  . LYRICA 75 MG capsule    Sig: Take 1 capsule (75 mg total) by mouth 2 (two) times daily.    Dispense:  180 capsule    Refill:  0    Face-to-face time spent with patient was 30 minutes. Greater than 50% of time was spent in counseling and coordination of care.  Follow-Up Instructions: Return in about 6 months (around 11/25/2017) for Osteoarthritis DDD FMS.   Pollyann Savoy,  MD  Note - This record has been created using Editor, commissioning.  Chart creation errors have been sought, but may not always  have been located. Such creation errors do not reflect on  the standard of medical care.

## 2017-05-28 ENCOUNTER — Ambulatory Visit (INDEPENDENT_AMBULATORY_CARE_PROVIDER_SITE_OTHER): Payer: Medicare PPO | Admitting: Rheumatology

## 2017-05-28 ENCOUNTER — Other Ambulatory Visit: Payer: Self-pay | Admitting: Cardiology

## 2017-05-28 ENCOUNTER — Ambulatory Visit (INDEPENDENT_AMBULATORY_CARE_PROVIDER_SITE_OTHER): Payer: Medicare PPO

## 2017-05-28 VITALS — BP 120/74 | HR 74 | Resp 16 | Ht 67.0 in | Wt 220.0 lb

## 2017-05-28 DIAGNOSIS — M25551 Pain in right hip: Secondary | ICD-10-CM

## 2017-05-28 DIAGNOSIS — M797 Fibromyalgia: Secondary | ICD-10-CM | POA: Diagnosis not present

## 2017-05-28 DIAGNOSIS — Z8639 Personal history of other endocrine, nutritional and metabolic disease: Secondary | ICD-10-CM

## 2017-05-28 DIAGNOSIS — M170 Bilateral primary osteoarthritis of knee: Secondary | ICD-10-CM

## 2017-05-28 DIAGNOSIS — M7061 Trochanteric bursitis, right hip: Secondary | ICD-10-CM

## 2017-05-28 DIAGNOSIS — I252 Old myocardial infarction: Secondary | ICD-10-CM

## 2017-05-28 DIAGNOSIS — M5136 Other intervertebral disc degeneration, lumbar region: Secondary | ICD-10-CM

## 2017-05-28 DIAGNOSIS — Z87898 Personal history of other specified conditions: Secondary | ICD-10-CM

## 2017-05-28 DIAGNOSIS — M1610 Unilateral primary osteoarthritis, unspecified hip: Secondary | ICD-10-CM

## 2017-05-28 DIAGNOSIS — Z8679 Personal history of other diseases of the circulatory system: Secondary | ICD-10-CM | POA: Diagnosis not present

## 2017-05-28 MED ORDER — DICLOFENAC SODIUM 1 % TD GEL: 4.0000 g | Freq: Four times a day (QID) | TRANSDERMAL | 3 refills | Status: DC

## 2017-05-28 MED ORDER — TRIAMCINOLONE ACETONIDE 40 MG/ML IJ SUSP
40.0000 mg | INTRAMUSCULAR | Status: AC | PRN
  Administered 2017-05-28: 40 mg via INTRA_ARTICULAR

## 2017-05-28 MED ORDER — LIDOCAINE HCL 1 % IJ SOLN
1.5000 mL | INTRAMUSCULAR | Status: AC | PRN
  Administered 2017-05-28: 1.5 mL

## 2017-05-28 MED ORDER — LYRICA 75 MG PO CAPS: 75.0000 mg | ORAL_CAPSULE | Freq: Two times a day (BID) | ORAL | 0 refills | Status: DC

## 2017-05-30 ENCOUNTER — Ambulatory Visit: Payer: BC Managed Care – PPO | Admitting: Rheumatology

## 2017-06-18 IMAGING — NM NM MISC PROCEDURE
6 series · 36 of 36 positions shown · non-contrast
Comparison: none

[Series 1: rest · 6.40mm/px · 6 of 64 frames shown]
[frame 6/64]
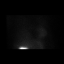
[frame 16/64]
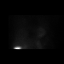
[frame 27/64]
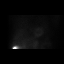
[frame 38/64]
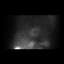
[frame 48/64]
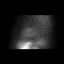
[frame 59/64]
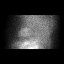

[Series 1: wbr_s-proj_st stress-gsp · 6.40mm/px · 6 of 512 frames shown]
[frame 43/512]
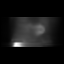
[frame 128/512]
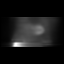
[frame 214/512]
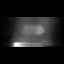
[frame 299/512]
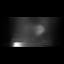
[frame 384/512]
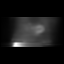
[frame 470/512]
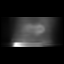

[Series 1: stress-sum-em · 6.40mm/px · 6 of 64 frames shown]
[frame 6/64]
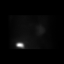
[frame 16/64]
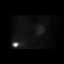
[frame 27/64]
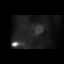
[frame 38/64]
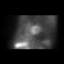
[frame 48/64]
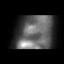
[frame 59/64]
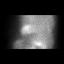

[Series 1: wbr_r-proj_st rest · 6.40mm/px · 6 of 64 frames shown]
[frame 6/64]
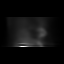
[frame 16/64]
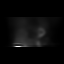
[frame 27/64]
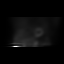
[frame 38/64]
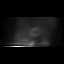
[frame 48/64]
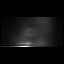
[frame 59/64]
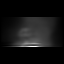

[Series 1: wbr_s-proj_st stress-sum-em · 6.40mm/px · 6 of 64 frames shown]
[frame 6/64]
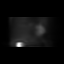
[frame 16/64]
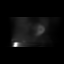
[frame 27/64]
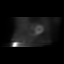
[frame 38/64]
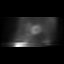
[frame 48/64]
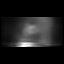
[frame 59/64]
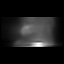

[Series 1: stress-gsp · 6.40mm/px · 6 of 512 frames shown]
[frame 43/512]
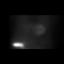
[frame 128/512]
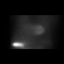
[frame 214/512]
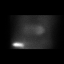
[frame 299/512]
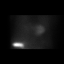
[frame 384/512]
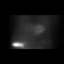
[frame 470/512]
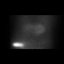

[36 of 36 positions shown; findings below may reference images not displayed]

Canned report from images found in remote index.

Refer to host system for actual result text.

## 2017-07-22 ENCOUNTER — Telehealth: Payer: Self-pay | Admitting: *Deleted

## 2017-07-22 NOTE — Telephone Encounter (Addendum)
Spoke with patient who has decided to not continue taking  the IP injections but would like to continue in the study by returning for clinic visits until the end of study. Next scheduled visit 07/28/17.

## 2017-07-28 ENCOUNTER — Encounter: Payer: Self-pay | Admitting: *Deleted

## 2017-07-28 DIAGNOSIS — Z006 Encounter for examination for normal comparison and control in clinical research program: Secondary | ICD-10-CM

## 2017-07-28 NOTE — Progress Notes (Signed)
Patient to the Research clinic for visit day 270 in the [employer] Research study.  Patient has decided to not receive any further injections of IP but will continue to come for scheduled visits for the remainder of the trial.  No c/o this morning, SAE already captured which occurred on 04/06/2017-04/08/2017. No additional aes to report.  Next scheduled appointment 09/16/2017.

## 2017-08-15 ENCOUNTER — Encounter: Payer: Self-pay | Admitting: Interventional Cardiology

## 2017-08-15 ENCOUNTER — Ambulatory Visit (INDEPENDENT_AMBULATORY_CARE_PROVIDER_SITE_OTHER): Payer: Medicare PPO | Admitting: Interventional Cardiology

## 2017-08-15 VITALS — BP 146/82 | HR 64 | Ht 67.0 in | Wt 217.6 lb

## 2017-08-15 DIAGNOSIS — I25119 Atherosclerotic heart disease of native coronary artery with unspecified angina pectoris: Secondary | ICD-10-CM | POA: Diagnosis not present

## 2017-08-15 DIAGNOSIS — I10 Essential (primary) hypertension: Secondary | ICD-10-CM

## 2017-08-15 DIAGNOSIS — I480 Paroxysmal atrial fibrillation: Secondary | ICD-10-CM

## 2017-08-15 DIAGNOSIS — I5032 Chronic diastolic (congestive) heart failure: Secondary | ICD-10-CM

## 2017-08-15 DIAGNOSIS — E785 Hyperlipidemia, unspecified: Secondary | ICD-10-CM

## 2017-08-15 NOTE — Patient Instructions (Signed)

## 2017-08-15 NOTE — Progress Notes (Signed)
Cardiology Office Note    Date:  08/15/2017   ID:  [name], DOB October 23, 1935, MRN [id number]  PCP:  Andi DevonShelton, Kimberly, MD  Cardiologist: Lesleigh NoeHenry W Abdallah Hern III, MD   Chief Complaint  Patient presents with  . Coronary Artery Disease    History of Present Illness:  [name] is a 81 y.o. female with a h/o CAD (s/p DES to LAD 2006, DES to Cx 2014, angiosculpt scoring balloon/DES to LAD 2016, DES to RCA 12/2015), chronic diastolic CHF, HTN, atrial fib/flutter, hyperlipidemia, pre-diabetes, chronic LEE (L>R), fibromyalgia, anxiety, remote h/o stomach ulcers.   No angina.  Has dyspnea on exertion that is well-tolerated.  No progression of symptoms related to cardiac status.  Has been depressed.  Sherilyn CooterHenry, her husband, requires much personal care as he also has heart and significant musculoskeletal problems that limit ambulation.  She has attempted to carveout personal time for herself and is trying to sleep longer.  I have spoken to Mr. [name] about allowing [name] to have some time for herself.   Past Medical History:  Diagnosis Date  . Anxiety   . Arthritis    Multiple joints  . Chronic diastolic CHF (congestive heart failure) (HCC) 01/16/2016  . Coronary artery disease    a. s/p DES to LAD 2006. b. DES to Cx 2014. c. angiosculpt scoring balloon/DES to LAD 2016. d. DES to RCA 12/2015.  . Fibromyalgia   . GERD (gastroesophageal reflux disease)   . High cholesterol   . History of stomach ulcers    Remotely  . Hypertension   . Lower extremity edema    a. h/o chronic edema L>R.  [name]. Neuropathy   . Osteoporosis   . PAF (paroxysmal atrial fibrillation) (HCC)    a. 2012 - H/P says atrial flutter, D/c summary says atrial fib - was tx with IV amiodarone, only had a few hours of arrhythmias, no clinical recurrence as of 2017.  [name]. Ventricular hypertrophy   . Vertigo 08/27/2006    Past Surgical History:  Procedure Laterality Date  . ABDOMINAL HYSTERECTOMY  1970's  .  BREAST BIOPSY Right 1970's  . BREAST LUMPECTOMY Right 1970's   benign   . CARDIAC CATHETERIZATION  10/2006   [name]/notes 08/03/2007 (01/27/2013)  . CARDIAC CATHETERIZATION N/A 01/01/2016   Procedure: Left Heart Cath and Coronary Angiography;  Surgeon: Kathleene Hazelhristopher D McAlhany, MD;  [facility];  Service: Cardiovascular;  Laterality: N/A;  . CARDIAC CATHETERIZATION N/A 01/01/2016   Procedure: Coronary Stent Intervention;  Surgeon: Kathleene Hazelhristopher D McAlhany, MD;  Location: [facility] INVASIVE CV LAB;  Service: Cardiovascular;  Laterality: N/A;  . CORONARY ANGIOPLASTY WITH STENT PLACEMENT  06/2005; 01/27/2013    2.515 mm Cypher stent to LAD in 2006; 2.75 x 12 Promus DES to the circumflex 2014    . DIAGNOSTIC LAPAROSCOPY  2000   lap abdominal lysis of adhesions  . ESOPHAGOGASTRODUODENOSCOPY N/A 11/10/2014   Procedure: ESOPHAGOGASTRODUODENOSCOPY (EGD);  Surgeon: Jeani HawkingPatrick Hung, MD;  [facility];  Service: Endoscopy;  Laterality: N/A;  . HERNIA REPAIR    . LAPAROSCOPIC SALPINGO OOPHERECTOMY  2000  . LAPAROTOMY     "day after oophorectomy; had bowel obstruction" (01/27/2013)  . LEFT HEART CATH AND CORONARY ANGIOGRAPHY N/A 04/07/2017   Procedure: LEFT HEART CATH AND CORONARY ANGIOGRAPHY;  Surgeon: Lyn RecordsSmith, Dannielle Baskins W, MD;  Location: [facility] INVASIVE CV LAB;  Service: Cardiovascular;  Laterality: N/A;  . LEFT HEART CATHETERIZATION WITH CORONARY ANGIOGRAM N/A 01/27/2013   Procedure: LEFT HEART CATHETERIZATION WITH  CORONARY ANGIOGRAM;  Surgeon: Corky Crafts, MD;  Location: [facility] CATH LAB;  Service: Cardiovascular;  Laterality: N/A;  . LEFT HEART CATHETERIZATION WITH CORONARY ANGIOGRAM N/A 11/10/2014   Procedure: LEFT HEART CATHETERIZATION WITH CORONARY ANGIOGRAM;  Surgeon: Lennette Bihari, MD; left main okay, LAD 75% ISR, FFR 0.75, s/p Angiosculpt scoring balloon and 2.7518 mm Resolute DES, CFX 70%, stent patent, RCA 50%  . TONSILLECTOMY AND ADENOIDECTOMY  ~ 1952    Current Medications: Outpatient Medications  Prior to Visit  Medication Sig Dispense Refill  . acetaminophen (TYLENOL) 500 MG tablet Take 500 mg by mouth daily as needed (pain).    [name] albuterol (PROVENTIL HFA;VENTOLIN HFA) 108 (90 Base) MCG/ACT inhaler Inhale 2 puffs into the lungs as directed.    [name] ALPRAZolam (XANAX) 0.25 MG tablet Take 0.25 mg by mouth 2 (two) times daily as needed for anxiety. anxiety    . aspirin EC 81 MG tablet Take 81 mg by mouth daily.      . clopidogrel (PLAVIX) 75 MG tablet take 1 tablet by mouth once daily 30 tablet 1  . Cyanocobalamin (B-12 PO) Take 1 tablet by mouth 3 (three) times a week.    . diclofenac sodium (VOLTAREN) 1 % GEL Apply 4 g topically 4 (four) times daily. 3 g 3  . Diphenhydramine-APAP, sleep, (TYLENOL PM EXTRA STRENGTH PO) Take 0.5 tablets by mouth at bedtime as needed. For sleep    . Evolocumab (REPATHA) 140 MG/ML SOSY Inject into the skin.    . fluticasone (FLONASE) 50 MCG/ACT nasal spray Place 2 sprays into both nostrils daily. 1 g 2  . loratadine (CLARITIN) 10 MG tablet Take 10 mg by mouth daily. Reported on 12/29/2015    . LYRICA 75 MG capsule Take 1 capsule (75 mg total) by mouth 2 (two) times daily. 180 capsule 0  . metoprolol tartrate (LOPRESSOR) 25 MG tablet Take 1 tablet (25 mg total) by mouth 2 (two) times daily. 60 tablet 9  . nitroGLYCERIN (NITROSTAT) 0.4 MG SL tablet Place 1 tablet (0.4 mg total) under the tongue every 5 (five) minutes as needed for chest pain. For 3 doses 25 tablet 5  . pantoprazole (PROTONIX) 40 MG tablet take 1 tablet by mouth once daily AT 6 AM 30 tablet 8  . potassium chloride SA (K-DUR,KLOR-CON) 20 MEQ tablet take 1 tablet by mouth once daily 90 tablet 1  . Probiotic Product (ALIGN PO) Take 1 tablet by mouth daily.    [name] torsemide (DEMADEX) 20 MG tablet Take 20 mg by mouth daily. Take 1 to 2 tablets by mouth daily as needed for swelling.    . trolamine salicylate (ASPERCREME) 10 % cream Apply 1 application topically as needed for muscle pain.    .  valsartan-hydrochlorothiazide (DIOVAN-HCT) 160-12.5 MG per tablet Take 1 tablet by mouth daily.     . Vitamin D, Ergocalciferol, (DRISDOL) 50000 UNITS CAPS capsule Take 50,000 Units by mouth every 7 (seven) days. Take on Mondays     No facility-administered medications prior to visit.      Allergies:   Adhesive [tape]; Crestor [rosuvastatin calcium]; Statins; Codeine; and Elavil [amitriptyline]   Social History   Socioeconomic History  . Marital status: Married    Spouse name: None  . Number of children: 3  . Years of education: masters  . Highest education level: None  Social Needs  . Financial resource strain: None  . Food insecurity - worry: None  . Food insecurity - inability: None  . Transportation  needs - medical: None  . Transportation needs - non-medical: None  Occupational History  . Occupation: retired  Tobacco Use  . Smoking status: Former Smoker    Packs/day: 0.12    Years: 36.00    Pack years: 4.32    Types: Cigarettes    Last attempt to quit: 12/02/1991    Years since quitting: 25.7  . Smokeless tobacco: Never Used  Substance and Sexual Activity  . Alcohol use: No  . Drug use: No  . Sexual activity: Not Currently    Birth control/protection: Surgical  Other Topics Concern  . None  Social History Narrative  . None     Family History:  The patient's family history includes Heart attack in her father; Stroke in her mother.   ROS:   Please see the history of present illness.    Right hip pain, depression, difficulty ambulating, back pain.  Starting Repatha for hyperlipidemia. All other systems reviewed and are negative.   PHYSICAL EXAM:   VS:  BP (!) 146/82   Pulse 64   Ht 5\' 7"  (1.702 m)   Wt 217 lb 9.6 oz (98.7 kg)   BMI 34.08 kg/m    GEN: Well nourished, well developed, in no acute distress  HEENT: normal  Neck: no JVD, carotid bruits, or masses Cardiac: RRR; no murmurs, rubs, or gallops,no edema  Respiratory:  clear to auscultation  bilaterally, normal work of breathing GI: soft, nontender, nondistended, + BS MS: no deformity or atrophy  Skin: warm and dry, no rash Neuro:  Alert and Oriented x 3, Strength and sensation are intact Psych: euthymic mood, full affect  Wt Readings from Last 3 Encounters:  08/15/17 217 lb 9.6 oz (98.7 kg)  07/28/17 217 lb (98.4 kg)  05/28/17 220 lb (99.8 kg)      Studies/Labs Reviewed:   EKG:  EKG  Not repeated.  Recent Labs: 01/08/2017: ALT 17 04/06/2017: B Natriuretic Peptide 84.7 04/08/2017: BUN 12; Creatinine, Ser 0.99; Hemoglobin 10.4; Platelets 254; Potassium 3.6; Sodium 143   Lipid Panel    Component Value Date/Time   CHOL 271 (H) 12/30/2015 0000   TRIG 90 12/30/2015 0000   HDL 49 12/30/2015 0000   CHOLHDL 5.5 12/30/2015 0000   VLDL 18 12/30/2015 0000   LDLCALC 204 (H) 12/30/2015 0000    Additional studies/ records that were reviewed today include:  No new data    ASSESSMENT:    1. Chronic diastolic CHF (congestive heart failure) (HCC)   2. Coronary artery disease involving native coronary artery of native heart with angina pectoris (HCC)   3. Essential hypertension   4. Paroxysmal atrial fibrillation (HCC)   5. Hyperlipidemia, unspecified hyperlipidemia type      PLAN:  In order of problems listed above:  1.  No evidence of volume overload. 2.  Occasional chest tightness occurring sporadically lasting less than 10 minutes.  EKG is normal. 3.  Blood pressure is adequately controlled.  Low-salt diet and weight loss are encouraged. 4.  No clinical recurrences of tachycardia or episodes of atrial fibrillation. 5.  Hyperlipidemia with LDL target less than 70.  Repatha is being started by Dr. Andi DevonKimberly Shelton.  It is great that Repatha is being used to treat her severely elevated lipids.  I encouraged aerobic activity.  Clinical follow-up in 6 months.      Medication Adjustments/Labs and Tests Ordered: Current medicines are reviewed at length with the  patient today.  Concerns regarding medicines are outlined above.  Medication changes,  Labs and Tests ordered today are listed in the Patient Instructions below. Patient Instructions  Medication Instructions:  Your physician recommends that you continue on your current medications as directed. Please refer to the Current Medication list given to you today.  Labwork: None  Testing/Procedures: None  Follow-Up: Your physician wants you to follow-up in: 6 months with Dr. Katrinka Blazing.  You will receive a reminder letter in the mail two months in advance. If you don't receive a letter, please call our office to schedule the follow-up appointment.   Any Other Special Instructions Will Be Listed Below (If Applicable).     If you need a refill on your cardiac medications before your next appointment, please call your pharmacy.      Signed, Lesleigh Noe, MD  08/15/2017 6:12 PM    [facility] [address] [place], [place], [place]  [address] Phone: [phone]; Fax: [phone]

## 2017-09-25 ENCOUNTER — Encounter: Payer: Self-pay | Admitting: *Deleted

## 2017-09-25 DIAGNOSIS — Z006 Encounter for examination for normal comparison and control in clinical research program: Secondary | ICD-10-CM

## 2017-09-25 NOTE — Progress Notes (Signed)
Late entry: Subject to research clinic on 09/24/2017 for [id number] in the [employer] 10 study.  No c/o, aes or saes to report. Next visit scheduled for Jan 07, 2018 @1000 .  Subject will not be getting the injection.

## 2017-11-17 NOTE — Progress Notes (Deleted)
Office Visit Note  Patient: [name]             Date of Birth: 1936-03-04           MRN: [id number]             PCP: Andi Devon, MD Referring: Andi Devon, MD Visit Date: 11/28/2017 Occupation: @GUAROCC @    Subjective:  No chief complaint on file.   History of Present Illness: [name] is a 82 y.o. female ***   Activities of Daily Living:  Patient reports morning stiffness for *** {minute/hour:19697}.   Patient {ACTIONS;DENIES/REPORTS:21021675::"Denies"} nocturnal pain.  Difficulty dressing/grooming: {ACTIONS;DENIES/REPORTS:21021675::"Denies"} Difficulty climbing stairs: {ACTIONS;DENIES/REPORTS:21021675::"Denies"} Difficulty getting out of chair: {ACTIONS;DENIES/REPORTS:21021675::"Denies"} Difficulty using hands for taps, buttons, cutlery, and/or writing: {ACTIONS;DENIES/REPORTS:21021675::"Denies"}   No Rheumatology ROS completed.   PMFS History:  Patient Active Problem List   Diagnosis Date Noted  . DDD (degenerative disc disease), lumbar 05/28/2017  . CKD (chronic kidney disease) stage 3, GFR 30-59 ml/min (HCC) 04/06/2017  . Hyperglycemia 04/06/2017  . Primary osteoarthritis of both knees 11/27/2016  . Osteoarthritis of pelvis 11/27/2016  . Fibromyalgia syndrome 11/27/2016  . Fatigue 11/27/2016  . Snoring 06/28/2016  . Chronic diastolic CHF (congestive heart failure) (HCC) 01/16/2016  . Schatzki's ring 11/11/2014  . Hiatal hernia 11/11/2014  . Essential hypertension   . Dyspnea 05/05/2014  . CAP (community acquired pneumonia) 05/02/2014  . Dysuria 01/26/2014  . Atrophic vaginitis 01/26/2014  . Hyperlipidemia 07/23/2013  . Old MI (myocardial infarction)     Class: Chronic  . Nausea and vomiting 11/13/2011  . Vertigo, benign positional 07/07/2011  . Coronary artery disease involving native coronary artery of native heart with angina pectoris (HCC) 07/07/2011  . A-fib (HCC) 07/07/2011    Past Medical History:  Diagnosis  Date  . Anxiety   . Arthritis    Multiple joints  . Chronic diastolic CHF (congestive heart failure) (HCC) 01/16/2016  . Coronary artery disease    a. s/p DES to LAD 2006. b. DES to Cx 2014. c. angiosculpt scoring balloon/DES to LAD 2016. d. DES to RCA 12/2015.  . Fibromyalgia   . GERD (gastroesophageal reflux disease)   . High cholesterol   . History of stomach ulcers    Remotely  . Hypertension   . Lower extremity edema    a. h/o chronic edema L>R.  [name] Neuropathy   . Osteoporosis   . PAF (paroxysmal atrial fibrillation) (HCC)    a. 2012 - H/P says atrial flutter, D/c summary says atrial fib - was tx with IV amiodarone, only had a few hours of arrhythmias, no clinical recurrence as of 2017.  [name] Ventricular hypertrophy   . Vertigo 08/27/2006    Family History  Problem Relation Age of Onset  . Stroke Mother   . Heart attack Father    Past Surgical History:  Procedure Laterality Date  . ABDOMINAL HYSTERECTOMY  1970's  . BREAST BIOPSY Right 1970's  . BREAST LUMPECTOMY Right 1970's   benign   . CARDIAC CATHETERIZATION  10/2006   [name] 08/03/2007 (01/27/2013)  . CARDIAC CATHETERIZATION N/A 01/01/2016   Procedure: Left Heart Cath and Coronary Angiography;  Surgeon: Kathleene Hazel, MD;  Location: [facility] INVASIVE CV LAB;  Service: Cardiovascular;  Laterality: N/A;  . CARDIAC CATHETERIZATION N/A 01/01/2016   Procedure: Coronary Stent Intervention;  Surgeon: Kathleene Hazel, MD;  Location: [facility] INVASIVE CV LAB;  Service: Cardiovascular;  Laterality: N/A;  . CORONARY ANGIOPLASTY WITH STENT PLACEMENT  06/2005; 01/27/2013  2.515 mm Cypher stent to LAD in 2006; 2.75 x 12 Promus DES to the circumflex 2014    . DIAGNOSTIC LAPAROSCOPY  2000   lap abdominal lysis of adhesions  . ESOPHAGOGASTRODUODENOSCOPY N/A 11/10/2014   Procedure: ESOPHAGOGASTRODUODENOSCOPY (EGD);  Surgeon: Jeani HawkingPatrick Hung, MD;  [facility];  Service: Endoscopy;  Laterality: N/A;  . HERNIA REPAIR    .  LAPAROSCOPIC SALPINGO OOPHERECTOMY  2000  . LAPAROTOMY     "day after oophorectomy; had bowel obstruction" (01/27/2013)  . LEFT HEART CATH AND CORONARY ANGIOGRAPHY N/A 04/07/2017   Procedure: LEFT HEART CATH AND CORONARY ANGIOGRAPHY;  Surgeon: Lyn RecordsSmith, Henry W, MD;  Location: [facility] INVASIVE CV LAB;  Service: Cardiovascular;  Laterality: N/A;  . LEFT HEART CATHETERIZATION WITH CORONARY ANGIOGRAM N/A 01/27/2013   Procedure: LEFT HEART CATHETERIZATION WITH CORONARY ANGIOGRAM;  Surgeon: Corky CraftsJayadeep S Varanasi, MD;  [facility];  Service: Cardiovascular;  Laterality: N/A;  . LEFT HEART CATHETERIZATION WITH CORONARY ANGIOGRAM N/A 11/10/2014   Procedure: LEFT HEART CATHETERIZATION WITH CORONARY ANGIOGRAM;  Surgeon: Lennette Biharihomas A Kelly, MD; left main okay, LAD 75% ISR, FFR 0.75, s/p Angiosculpt scoring balloon and 2.7518 mm Resolute DES, CFX 70%, stent patent, RCA 50%  . TONSILLECTOMY AND ADENOIDECTOMY  ~ 481952   Social History   Social History Narrative  . Not on file     Objective: Vital Signs: There were no vitals taken for this visit.   Physical Exam   Musculoskeletal Exam: ***  CDAI Exam: No CDAI exam completed.    Investigation: No additional findings. CBC Latest Ref Rng & Units 04/08/2017 04/06/2017 01/08/2017  WBC 4.0 - 10.5 K/uL 4.1 6.2 7.2  Hemoglobin 12.0 - 15.0 g/dL 10.4(L) 11.6(L) 11.0(L)  Hematocrit 36.0 - 46.0 % 32.4(L) 35.0(L) 34.4(L)  Platelets 150 - 400 K/uL 254 244 203   CMP Latest Ref Rng & Units 04/08/2017 04/07/2017 04/06/2017  Glucose 65 - 99 mg/dL 161(W101(H) 960(A119(H) 540(J122(H)  BUN 6 - 20 mg/dL 12 17 81(X22(H)  Creatinine 0.44 - 1.00 mg/dL 9.140.99 7.82(N1.07(H) 5.62(Z1.26(H)  Sodium 135 - 145 mmol/L 143 142 139  Potassium 3.5 - 5.1 mmol/L 3.6 3.2(L) 3.2(L)  Chloride 101 - 111 mmol/L 108 103 101  CO2 22 - 32 mmol/L 27 31 25   Calcium 8.9 - 10.3 mg/dL 9.2 9.4 9.2  Total Protein 6.5 - 8.1 g/dL - - -  Total Bilirubin 0.3 - 1.2 mg/dL - - -  Alkaline Phos 38 - 126 U/L - - -  AST 15 - 41 U/L - - -  ALT 14  - 54 U/L - - -    Imaging: No results found.  Speciality Comments: No specialty comments available.    Procedures:  No procedures performed Allergies: Adhesive [tape]; Crestor [rosuvastatin calcium]; Statins; Codeine; and Elavil [amitriptyline]   Assessment / Plan:     Visit Diagnoses: No diagnosis found.    Orders: No orders of the defined types were placed in this encounter.  No orders of the defined types were placed in this encounter.   Face-to-face time spent with patient was *** minutes. 50% of time was spent in counseling and coordination of care.  Follow-Up Instructions: No Follow-up on file.   Ellen HenriMarissa C Yarah Fuente, CMA  Note - This record has been created using Animal nutritionistDragon software.  Chart creation errors have been sought, but may not always  have been located. Such creation errors do not reflect on  the standard of medical care.

## 2017-11-25 ENCOUNTER — Other Ambulatory Visit: Payer: Self-pay | Admitting: *Deleted

## 2017-11-25 NOTE — Telephone Encounter (Signed)
ok 

## 2017-11-25 NOTE — Telephone Encounter (Signed)
Refill request received via fax.   Last Visit: 05/28/17 Next Visit: 11/28/17  Okay to refill Lyrica?

## 2017-11-26 MED ORDER — LYRICA 75 MG PO CAPS: 75.0000 mg | ORAL_CAPSULE | Freq: Two times a day (BID) | ORAL | 0 refills | Status: DC

## 2017-11-28 ENCOUNTER — Ambulatory Visit: Payer: Medicare PPO | Admitting: Rheumatology

## 2017-12-04 NOTE — Progress Notes (Signed)
Office Visit Note  Patient: [name]             Date of Birth: 1935-11-25           MRN: [id number]             PCP: Andi Devon, MD Referring: Andi Devon, MD Visit Date: 12/09/2017 Occupation: @GUAROCC @    Subjective:  Lower back pain    History of Present Illness: [name] is a 82 y.o. female with history of osteoarthritis, fibromyalgia, degenerative disc disease.  She takes Lyrica 75 mg 3 times daily.  Patient states she continues to have significant pain in her lower back and right trochanteric bursa.  She had a cortisone injection of right trochanteric on 05/28/2017.  she had an evaluation by Dr. Lequita Halt who performed a cortisone injection in her right trochanteric bursa in January 2019.  She continues to have pain in this region as well as her lower back.  She describes the pain as a burning sensation.  She reports radiation of pain down her right leg.  She states she uses voltaren gel in this region.  She states the pain is worse with changes in position and standing for prolonged periods of time. She has been using a wheeled walker for assistance.  She states she continues to have chronic pain and stiffness in her neck.  She also is having pain in bilateral hands.      Activities of Daily Living:  Patient reports morning stiffness for 4 minutes.   Patient reports nocturnal pain.  Difficulty dressing/grooming: Reports Difficulty climbing stairs: Reports Difficulty getting out of chair: Reports Difficulty using hands for taps, buttons, cutlery, and/or writing: Denies   Review of Systems  Constitutional: Positive for fatigue.  HENT: Negative for mouth sores, mouth dryness and nose dryness.   Eyes: Negative for pain, visual disturbance and dryness.  Respiratory: Negative for cough, hemoptysis, shortness of breath and difficulty breathing.   Cardiovascular: Positive for swelling in legs/feet. Negative for chest pain, palpitations and  hypertension.  Gastrointestinal: Negative for blood in stool, constipation and diarrhea.  Endocrine: Negative for cold intolerance and increased urination.  Genitourinary: Negative for difficulty urinating and painful urination.  Musculoskeletal: Positive for arthralgias, joint pain, joint swelling, muscle weakness, morning stiffness and muscle tenderness. Negative for myalgias and myalgias.  Skin: Negative for color change, pallor, rash, hair loss, nodules/bumps, skin tightness, ulcers and sensitivity to sunlight.  Allergic/Immunologic: Negative for susceptible to infections.  Neurological: Negative for dizziness and headaches.  Hematological: Negative for bruising/bleeding tendency and swollen glands.  Psychiatric/Behavioral: Positive for sleep disturbance. Negative for depressed mood. The patient is not nervous/anxious.     PMFS History:  Patient Active Problem List   Diagnosis Date Noted  . DDD (degenerative disc disease), lumbar 05/28/2017  . CKD (chronic kidney disease) stage 3, GFR 30-59 ml/min (HCC) 04/06/2017  . Hyperglycemia 04/06/2017  . Primary osteoarthritis of both knees 11/27/2016  . Osteoarthritis of pelvis 11/27/2016  . Fibromyalgia syndrome 11/27/2016  . Fatigue 11/27/2016  . Snoring 06/28/2016  . Chronic diastolic CHF (congestive heart failure) (HCC) 01/16/2016  . Schatzki's ring 11/11/2014  . Hiatal hernia 11/11/2014  . Essential hypertension   . Dyspnea 05/05/2014  . CAP (community acquired pneumonia) 05/02/2014  . Dysuria 01/26/2014  . Atrophic vaginitis 01/26/2014  . Hyperlipidemia 07/23/2013  . Old MI (myocardial infarction)     Class: Chronic  . Nausea and vomiting 11/13/2011  . Vertigo, benign positional 07/07/2011  . Coronary  artery disease involving native coronary artery of native heart with angina pectoris (HCC) 07/07/2011  . A-fib (HCC) 07/07/2011    Past Medical History:  Diagnosis Date  . Anxiety   . Arthritis    Multiple joints  . Chronic  diastolic CHF (congestive heart failure) (HCC) 01/16/2016  . Coronary artery disease    a. s/p DES to LAD 2006. b. DES to Cx 2014. c. angiosculpt scoring balloon/DES to LAD 2016. d. DES to RCA 12/2015.  . Fibromyalgia   . GERD (gastroesophageal reflux disease)   . High cholesterol   . History of stomach ulcers    Remotely  . Hypertension   . Lower extremity edema    a. h/o chronic edema L>R.  [name]. Neuropathy   . Osteoporosis   . PAF (paroxysmal atrial fibrillation) (HCC)    a. 2012 - H/P says atrial flutter, D/c summary says atrial fib - was tx with IV amiodarone, only had a few hours of arrhythmias, no clinical recurrence as of 2017.  [name]. Ventricular hypertrophy   . Vertigo 08/27/2006    Family History  Problem Relation Age of Onset  . Stroke Mother   . Heart attack Father    Past Surgical History:  Procedure Laterality Date  . ABDOMINAL HYSTERECTOMY  1970's  . BREAST BIOPSY Right 1970's  . BREAST LUMPECTOMY Right 1970's   benign   . CARDIAC CATHETERIZATION  10/2006   [name]/notes 08/03/2007 (01/27/2013)  . CARDIAC CATHETERIZATION N/A 01/01/2016   Procedure: Left Heart Cath and Coronary Angiography;  Surgeon: Kathleene Hazelhristopher D McAlhany, MD;  [facility];  Service: Cardiovascular;  Laterality: N/A;  . CARDIAC CATHETERIZATION N/A 01/01/2016   Procedure: Coronary Stent Intervention;  Surgeon: Kathleene Hazelhristopher D McAlhany, MD;  Location: [facility] INVASIVE CV LAB;  Service: Cardiovascular;  Laterality: N/A;  . CORONARY ANGIOPLASTY WITH STENT PLACEMENT  06/2005; 01/27/2013    2.515 mm Cypher stent to LAD in 2006; 2.75 x 12 Promus DES to the circumflex 2014    . DIAGNOSTIC LAPAROSCOPY  2000   lap abdominal lysis of adhesions  . ESOPHAGOGASTRODUODENOSCOPY N/A 11/10/2014   Procedure: ESOPHAGOGASTRODUODENOSCOPY (EGD);  Surgeon: Jeani HawkingPatrick Hung, MD;  [facility];  Service: Endoscopy;  Laterality: N/A;  . HERNIA REPAIR    . LAPAROSCOPIC SALPINGO OOPHERECTOMY  2000  . LAPAROTOMY     "day after  oophorectomy; had bowel obstruction" (01/27/2013)  . LEFT HEART CATH AND CORONARY ANGIOGRAPHY N/A 04/07/2017   Procedure: LEFT HEART CATH AND CORONARY ANGIOGRAPHY;  Surgeon: Lyn RecordsSmith, Henry W, MD;  Location: [facility] INVASIVE CV LAB;  Service: Cardiovascular;  Laterality: N/A;  . LEFT HEART CATHETERIZATION WITH CORONARY ANGIOGRAM N/A 01/27/2013   Procedure: LEFT HEART CATHETERIZATION WITH CORONARY ANGIOGRAM;  Surgeon: Corky CraftsJayadeep S Varanasi, MD;  [facility];  Service: Cardiovascular;  Laterality: N/A;  . LEFT HEART CATHETERIZATION WITH CORONARY ANGIOGRAM N/A 11/10/2014   Procedure: LEFT HEART CATHETERIZATION WITH CORONARY ANGIOGRAM;  Surgeon: Lennette Biharihomas A Kelly, MD; left main okay, LAD 75% ISR, FFR 0.75, s/p Angiosculpt scoring balloon and 2.7518 mm Resolute DES, CFX 70%, stent patent, RCA 50%  . TONSILLECTOMY AND ADENOIDECTOMY  ~ 411952   Social History   Social History Narrative  . Not on file     Objective: Vital Signs: BP 131/73 (BP Location: Left Arm, Patient Position: Sitting, Cuff Size: Normal)   Pulse 71   Resp 18   Ht 5' 6.5" (1.689 m)   Wt 213 lb (96.6 kg)   BMI 33.86 kg/m  Physical Exam  Constitutional: She is oriented to person, place, and time. She appears well-developed and well-nourished.  HENT:  Head: Normocephalic and atraumatic.  Eyes: Conjunctivae and EOM are normal.  Neck: Normal range of motion.  Cardiovascular: Normal rate, regular rhythm, normal heart sounds and intact distal pulses.  Pulmonary/Chest: Effort normal and breath sounds normal.  Abdominal: Soft. Bowel sounds are normal.  Lymphadenopathy:    She has no cervical adenopathy.  Neurological: She is alert and oriented to person, place, and time.  Skin: Skin is warm and dry. Capillary refill takes less than 2 seconds.  Psychiatric: She has a normal mood and affect. Her behavior is normal.  Nursing note and vitals reviewed.    Musculoskeletal Exam: Generalized hyperalgesia on exam. C-spine good ROM.   Limited ROM of thoracic and lumbar spine.  Shoulder joints, elbow joints, wrist joints, MCPs, PIPs, and DIPs good ROM with no synovitis.  Hip joints, knee joints, ankle joints, MTPs, PIPs, and DIPs good ROM with no synovitis.  Tenderness of right trochanteric bursa and along IT band.  She has midline spinal tenderness in the lumbar region.  No warmth or effusion of knee joints.    CDAI Exam: No CDAI exam completed.    Investigation: No additional findings. CBC Latest Ref Rng & Units 04/08/2017 04/06/2017 01/08/2017  WBC 4.0 - 10.5 K/uL 4.1 6.2 7.2  Hemoglobin 12.0 - 15.0 g/dL 10.4(L) 11.6(L) 11.0(L)  Hematocrit 36.0 - 46.0 % 32.4(L) 35.0(L) 34.4(L)  Platelets 150 - 400 K/uL 254 244 203   CMP Latest Ref Rng & Units 04/08/2017 04/07/2017 04/06/2017  Glucose 65 - 99 mg/dL 409(W) 119(J) 478(G)  BUN 6 - 20 mg/dL 12 17 95(A)  Creatinine 0.44 - 1.00 mg/dL 2.13 0.86(V) 7.84(O)  Sodium 135 - 145 mmol/L 143 142 139  Potassium 3.5 - 5.1 mmol/L 3.6 3.2(L) 3.2(L)  Chloride 101 - 111 mmol/L 108 103 101  CO2 22 - 32 mmol/L 27 31 25   Calcium 8.9 - 10.3 mg/dL 9.2 9.4 9.2  Total Protein 6.5 - 8.1 g/dL - - -  Total Bilirubin 0.3 - 1.2 mg/dL - - -  Alkaline Phos 38 - 126 U/L - - -  AST 15 - 41 U/L - - -  ALT 14 - 54 U/L - - -    Imaging: Xr Lumbar Spine 2-3 Views  Result Date: 12/09/2017 L5-S1 narrowing was noted.  None of the other disc space showed narrowing.  Facet joint arthropathy was noted.  Mild osteoarthritic changes were noted in the SI joint.   Speciality Comments: No specialty comments available.    Procedures:  Large Joint Inj: R greater trochanter on 12/09/2017 4:25 PM Indications: pain Details: 27 G 1.5 in needle, lateral approach  Arthrogram: No  Medications: 1 mL lidocaine 1 %; 40 mg triamcinolone acetonide 40 MG/ML Aspirate: 0 mL Outcome: tolerated well, no immediate complications Procedure, treatment alternatives, risks and benefits explained, specific risks discussed. Consent was  given by the patient. Immediately prior to procedure a time out was called to verify the correct patient, procedure, equipment, support staff and site/side marked as required. Patient was prepped and draped in the usual sterile fashion.     Allergies: Adhesive [tape]; Crestor [rosuvastatin calcium]; Statins; Codeine; and Elavil [amitriptyline]   Assessment / Plan:     Visit Diagnoses: Primary osteoarthritis of both knees: No warmth or effusion of knee joints.  She has no discomfort at this time.  She has been walking with a cane for stability.  DDD (degenerative disc disease), lumbar: She has midline spinal tenderness.  X-ray lumbar spine was obtained today.  X-ray revealed L5-S1 narrowing and facet joint arthropathy. We discussed ordering a MRI in the future.  She is going to follow up with Dr. Lequita Halt.    Chronic right-sided low back pain with right-sided sciatica -She has significant lower back pain and right sided sciatica.  She has midline spinal tenderness in the lumbar region.  Her pain is most severe when changing positions.  She has been having difficulty with walking and is using a cane for stability. We obtained a x-ray today in the office. X-rays revealed narrowing of L5-S1. Plan: XR Lumbar Spine 2-3 Views   Fibromyalgia syndrome: She has generalized hyperalgesia on exam.  She has generalized muscle tenderness and tension.  She continues to have chronic fatigue and insomnia.    Other fatigue: Chronic   Trochanteric bursitis, right hip: She continues to have tenderness of the right trochanteric bursa.  She had a cortisone injection performed on 05/28/2018 as well as another cortisone injection in January 2019.  We performed a cortisone injection today in the office.  She tolerated the procedure well.   Other medical conditions are listed as follows:   History of coronary artery disease - MI and stent June 2014  History of hypercholesterolemia  History of  hypertension     Orders: Orders Placed This Encounter  Procedures  . Large Joint Inj  . XR Lumbar Spine 2-3 Views   No orders of the defined types were placed in this encounter.   Face-to-face time spent with patient was 30 minutes. >50% of time was spent in counseling and coordination of care.  Follow-Up Instructions: Return for Osteoarthritis, Fibromyalgia,DDD.   Gearldine Bienenstock, PA-C  Note - This record has been created using Dragon software.  Chart creation errors have been sought, but may not always  have been located. Such creation errors do not reflect on  the standard of medical care.

## 2017-12-09 ENCOUNTER — Ambulatory Visit (INDEPENDENT_AMBULATORY_CARE_PROVIDER_SITE_OTHER): Payer: MEDICARE

## 2017-12-09 ENCOUNTER — Ambulatory Visit (INDEPENDENT_AMBULATORY_CARE_PROVIDER_SITE_OTHER): Payer: MEDICARE | Admitting: Physician Assistant

## 2017-12-09 ENCOUNTER — Encounter: Payer: Self-pay | Admitting: Physician Assistant

## 2017-12-09 VITALS — BP 131/73 | HR 71 | Resp 18 | Ht 66.5 in | Wt 213.0 lb

## 2017-12-09 DIAGNOSIS — G8929 Other chronic pain: Secondary | ICD-10-CM

## 2017-12-09 DIAGNOSIS — M797 Fibromyalgia: Secondary | ICD-10-CM | POA: Diagnosis not present

## 2017-12-09 DIAGNOSIS — R5383 Other fatigue: Secondary | ICD-10-CM

## 2017-12-09 DIAGNOSIS — M5136 Other intervertebral disc degeneration, lumbar region: Secondary | ICD-10-CM

## 2017-12-09 DIAGNOSIS — Z8679 Personal history of other diseases of the circulatory system: Secondary | ICD-10-CM

## 2017-12-09 DIAGNOSIS — Z8639 Personal history of other endocrine, nutritional and metabolic disease: Secondary | ICD-10-CM

## 2017-12-09 DIAGNOSIS — M170 Bilateral primary osteoarthritis of knee: Secondary | ICD-10-CM

## 2017-12-09 DIAGNOSIS — M5441 Lumbago with sciatica, right side: Secondary | ICD-10-CM

## 2017-12-09 DIAGNOSIS — M7061 Trochanteric bursitis, right hip: Secondary | ICD-10-CM | POA: Diagnosis not present

## 2017-12-09 DIAGNOSIS — M51369 Other intervertebral disc degeneration, lumbar region without mention of lumbar back pain or lower extremity pain: Secondary | ICD-10-CM

## 2017-12-12 MED ORDER — TRIAMCINOLONE ACETONIDE 40 MG/ML IJ SUSP
40.0000 mg | INTRAMUSCULAR | Status: AC | PRN
  Administered 2017-12-09: 40 mg via INTRA_ARTICULAR

## 2017-12-12 MED ORDER — LIDOCAINE HCL 1 % IJ SOLN
1.0000 mL | INTRAMUSCULAR | Status: AC | PRN
  Administered 2017-12-09: 1 mL

## 2017-12-29 ENCOUNTER — Other Ambulatory Visit: Payer: Self-pay | Admitting: Interventional Cardiology

## 2018-01-07 ENCOUNTER — Encounter: Payer: Self-pay | Admitting: *Deleted

## 2018-01-07 DIAGNOSIS — Z006 Encounter for examination for normal comparison and control in clinical research program: Secondary | ICD-10-CM

## 2018-01-07 NOTE — Progress Notes (Signed)
Subject to research clinic for [id number] in the [employer] 10 clinical trial.  No c/o, aes or saes to report.  Subject is not getting any injections for the remainder of the study.  Next f/u appt scheduled.

## 2018-01-08 ENCOUNTER — Encounter (HOSPITAL_COMMUNITY): Payer: Self-pay | Admitting: Emergency Medicine

## 2018-01-08 ENCOUNTER — Ambulatory Visit (HOSPITAL_COMMUNITY)
Admission: EM | Admit: 2018-01-08 | Discharge: 2018-01-08 | Disposition: A | Discharge status: Home / Self Care | Payer: Medicare HMO | Attending: Family Medicine | Admitting: Family Medicine

## 2018-01-08 DIAGNOSIS — J40 Bronchitis, not specified as acute or chronic: Secondary | ICD-10-CM

## 2018-01-08 MED ORDER — HYDROCODONE-HOMATROPINE 5-1.5 MG/5ML PO SYRP: 5.0000 mL | ORAL_SOLUTION | Freq: Four times a day (QID) | ORAL | 0 refills | Status: DC | PRN

## 2018-01-08 MED ORDER — AZITHROMYCIN 250 MG PO TABS: ORAL_TABLET | ORAL | 0 refills | Status: DC

## 2018-01-08 NOTE — Discharge Instructions (Addendum)
Please return if symptoms worsen.

## 2018-01-08 NOTE — ED Triage Notes (Signed)
PT reports congestion, drainage, productive cough that started Monday.

## 2018-01-08 NOTE — ED Provider Notes (Signed)
[facility]ENTER   [id number] 01/08/18 Arrival Time: 1603   SUBJECTIVE:  [name] is a 82 y.o. female who presents to the urgent care with complaint of congestion, drainage, productive cough that started Monday.   Patient has had chills at night and is having difficulty sleeping.  She has no history of smoking or asthma.  She is not short of breath.  No stomach pain, nausea, vomiting, or diarrhea.  She denies ear pain or sore throat currently although it did begin with soreness in the posterior pharynx   Past Medical History:  Diagnosis Date  . Anxiety   . Arthritis    Multiple joints  . Chronic diastolic CHF (congestive heart failure) (HCC) 01/16/2016  . Coronary artery disease    a. s/p DES to LAD 2006. b. DES to Cx 2014. c. angiosculpt scoring balloon/DES to LAD 2016. d. DES to RCA 12/2015.  . Fibromyalgia   . GERD (gastroesophageal reflux disease)   . High cholesterol   . History of stomach ulcers    Remotely  . Hypertension   . Lower extremity edema    a. h/o chronic edema L>R.  [name] Neuropathy   . Osteoporosis   . PAF (paroxysmal atrial fibrillation) (HCC)    a. 2012 - H/P says atrial flutter, D/c summary says atrial fib - was tx with IV amiodarone, only had a few hours of arrhythmias, no clinical recurrence as of 2017.  [name] Ventricular hypertrophy   . Vertigo 08/27/2006   Family History  Problem Relation Age of Onset  . Stroke Mother   . Heart attack Father    Social History   Socioeconomic History  . Marital status: Married    Spouse name: Not on file  . Number of children: 3  . Years of education: masters  . Highest education level: Not on file  Occupational History  . Occupation: retired  Engineer, production  . Financial resource strain: Not on file  . Food insecurity:    Worry: Not on file    Inability: Not on file  . Transportation needs:    Medical: Not on file    Non-medical: Not on file  Tobacco Use  . Smoking status: Former Smoker   Packs/day: 0.12    Years: 36.00    Pack years: 4.32    Types: Cigarettes    Last attempt to quit: 12/02/1991    Years since quitting: 26.1  . Smokeless tobacco: Never Used  Substance and Sexual Activity  . Alcohol use: No  . Drug use: No  . Sexual activity: Not Currently    Birth control/protection: Surgical  Lifestyle  . Physical activity:    Days per week: Not on file    Minutes per session: Not on file  . Stress: Not on file  Relationships  . Social connections:    Talks on phone: Not on file    Gets together: Not on file    Attends religious service: Not on file    Active member of club or organization: Not on file    Attends meetings of clubs or organizations: Not on file    Relationship status: Not on file  . Intimate partner violence:    Fear of current or ex partner: Not on file    Emotionally abused: Not on file    Physically abused: Not on file    Forced sexual activity: Not on file  Other Topics Concern  . Not on file  Social History Narrative  .  Not on file   No outpatient medications have been marked as taking for the 01/08/18 encounter [facility] Encounter).   Allergies  Allergen Reactions  . Adhesive [Tape] Itching and Rash  . Crestor [Rosuvastatin Calcium]     Body myalgias on  twice weekly  . Statins     Atorvastatin , , and , rosuvastatin, simvastatin - myalgias; pitavastatin   "feels bad". Tolerating Crestor  4x/week as of 2017  . Codeine Itching    Tolerates low dose of norco  . Elavil [Amitriptyline] Other (See Comments)    Dissociation      ROS: As per HPI, remainder of ROS negative.   OBJECTIVE:   Vitals:   01/08/18 1644  BP: 106/60  Pulse: 72  Resp: 16  Temp: 97.6 F (36.4 C)  TempSrc: Temporal  SpO2: 100%  Weight: 207 lb (93.9 kg)     General appearance: alert; no distress Eyes: PERRL; EOMI; conjunctiva normal HENT: normocephalic; atraumatic; TMs normal, canal normal, external ears normal without  trauma; nasal mucosa normal; oral mucosa normal Neck: supple Lungs: Rhonchi on auscultation bilaterally Heart: regular rate and rhythm Back: no CVA tenderness Extremities: no cyanosis or edema; symmetrical with no gross deformities Skin: warm and dry Neurologic: normal gait; grossly normal Psychological: alert and cooperative; normal mood and affect      Labs:  Results for orders placed or performed during the hospital encounter of 04/06/17  Basic metabolic panel  Result Value Ref Range   Sodium 139 135 - 145 mmol/L   Potassium 3.2 (L) 3.5 - 5.1 mmol/L   Chloride 101 101 - 111 mmol/L   CO2 25 22 - 32 mmol/L   Glucose, Bld 122 (H) 65 - 99 mg/dL   BUN 22 (H) 6 - 20 mg/dL   Creatinine, Ser 6.96 (H) 0.44 - 1.00 mg/dL   Calcium 9.2 8.9 - 29.5 mg/dL   GFR calc non Af Amer 40 (L) >60 mL/min   GFR calc Af Amer 46 (L) >60 mL/min   Anion gap 13 5 - 15  CBC  Result Value Ref Range   WBC 6.2 4.0 - 10.5 K/uL   RBC 4.19 3.87 - 5.11 MIL/uL   Hemoglobin 11.6 (L) 12.0 - 15.0 g/dL   HCT 28.4 (L) 13.2 - 44.0 %   MCV 83.5 78.0 - 100.0 fL   MCH 27.7 26.0 - 34.0 pg   MCHC 33.1 30.0 - 36.0 g/dL   RDW 10.2 72.5 - 36.6 %   Platelets 244 150 - 400 K/uL  D-dimer, quantitative (not at [facility])  Result Value Ref Range   D-Dimer, Quant 0.39 0.00 - 0.50 ug/mL-FEU  Brain natriuretic peptide  Result Value Ref Range   B Natriuretic Peptide 84.7 0.0 - 100.0 pg/mL  Troponin I-serum (0, 3, 6 hours)  Result Value Ref Range   Troponin I <0.03 <0.03 ng/mL  Troponin I-serum (0, 3, 6 hours)  Result Value Ref Range   Troponin I <0.03 <0.03 ng/mL  Troponin I-serum (0, 3, 6 hours)  Result Value Ref Range   Troponin I <0.03 <0.03 ng/mL  Basic metabolic panel  Result Value Ref Range   Sodium 142 135 - 145 mmol/L   Potassium 3.2 (L) 3.5 - 5.1 mmol/L   Chloride 103 101 - 111 mmol/L   CO2 31 22 - 32 mmol/L   Glucose, Bld 119 (H) 65 - 99 mg/dL   BUN 17 6 - 20 mg/dL   Creatinine, Ser 4.40 (H) 0.44 - 1.00  mg/dL  Calcium 9.4 8.9 - 10.3 mg/dL   GFR calc non Af Amer 48 (L) >60 mL/min   GFR calc Af Amer 56 (L) >60 mL/min   Anion gap 8 5 - 15  Protime-INR  Result Value Ref Range   Prothrombin Time 13.7 11.4 - 15.2 seconds   INR 1.05   Basic metabolic panel  Result Value Ref Range   Sodium 143 135 - 145 mmol/L   Potassium 3.6 3.5 - 5.1 mmol/L   Chloride 108 101 - 111 mmol/L   CO2 27 22 - 32 mmol/L   Glucose, Bld 101 (H) 65 - 99 mg/dL   BUN 12 6 - 20 mg/dL   Creatinine, Ser 4.09 0.44 - 1.00 mg/dL   Calcium 9.2 8.9 - 81.1 mg/dL   GFR calc non Af Amer 53 (L) >60 mL/min   GFR calc Af Amer >60 >60 mL/min   Anion gap 8 5 - 15  CBC  Result Value Ref Range   WBC 4.1 4.0 - 10.5 K/uL   RBC 3.79 (L) 3.87 - 5.11 MIL/uL   Hemoglobin 10.4 (L) 12.0 - 15.0 g/dL   HCT 91.4 (L) 78.2 - 95.6 %   MCV 85.5 78.0 - 100.0 fL   MCH 27.4 26.0 - 34.0 pg   MCHC 32.1 30.0 - 36.0 g/dL   RDW 21.3 (H) 08.6 - 57.8 %   Platelets 254 150 - 400 K/uL  I-stat troponin, ED  Result Value Ref Range   Troponin i, poc 0.00 0.00 - 0.08 ng/mL   Comment 3            Labs Reviewed - No data to display  No results found.     ASSESSMENT & PLAN:  1. Bronchitis   Please return if symptoms worsen  Meds ordered this encounter  Medications  . azithromycin (ZITHROMAX) 250 MG tablet    Sig: Take 2 tabs PO x 1 dose, then 1 tab PO QD x 4 days    Dispense:  6 tablet    Refill:  0  . HYDROcodone-homatropine (HYDROMET) 5-1.5 MG/5ML syrup    Sig: Take 5 mLs by mouth every 6 (six) hours as needed for cough.    Dispense:  60 mL    Refill:  0    Reviewed expectations re: course of current medical issues. Questions answered. Outlined signs and symptoms indicating need for more acute intervention. Patient verbalized understanding. After Visit Summary given.    Procedures:      Elvina Sidle, MD 01/08/18 1750

## 2018-01-15 ENCOUNTER — Telehealth: Payer: Self-pay

## 2018-01-15 NOTE — Telephone Encounter (Signed)
   [facility] Pre-operative Risk Assessment    Request for surgical clearance:  1. What type of surgery is being performed? Colonoscopy with Propofol   2. When is this surgery scheduled? 02/11/2018   3. What type of clearance is required (medical clearance vs. Pharmacy clearance to hold med vs. Both)? both  4. Are there any medications that need to be held prior to surgery and how long? Not listed, Plavix & Aspirin   5. Practice name and name of physician performing surgery? [facility], physician not listed    6. What is your office phone number?  [phone]    7.   What is your office fax number?  [phone]  8.   Anesthesia type (None, local, MAC, general)?  w/Propofol   Joaquim Lai 01/15/2018, 11:28 AM  _________________________________________________________________   (provider comments below)

## 2018-01-16 NOTE — Telephone Encounter (Signed)
Okay to hold aspirin and plavix for 5 days prior to colonoscopy.

## 2018-01-19 NOTE — Telephone Encounter (Signed)
Message left on VM for pt to call back to preop clinic, 1:30-5p. Her last office visit was in 08/2017. Need to review any new symptoms for clearance.

## 2018-01-21 NOTE — Telephone Encounter (Signed)
   Primary Cardiologist: Lesleigh Noe, MD  Chart reviewed as part of pre-operative protocol coverage. Given past medical history and time since last visit, based on ACC/AHA guidelines, [name] would be at acceptable risk for the planned procedure without further cardiovascular testing. She is easily getting > 4 mets of activity.   I will route this recommendation to the requesting party via Epic fax function and remove from pre-op pool.  Please call with questions.  Pr[place], [place] 01/21/2018, 1:51 PM

## 2018-02-19 NOTE — Progress Notes (Signed)
Office Visit Note  Patient: [name]             Date of Birth: 06-12-1936           MRN: [id number]             PCP: Andi Devon, MD Referring: Andi Devon, MD Visit Date: 02/20/2018 Occupation: @GUAROCC @    Subjective:  Bilateral knee pain   History of Present Illness: [name] is a 82 y.o. female with history of osteoarthritis, fibromyalgia, and DDD.  She presents today with bilateral knee pain that started about 3 weeks ago.  She denies any injuries or falls.  She denies any overuse type activities.  She states that her right knee pain is more severe than the left knee.  She reports her right knee as been swelling.  She reports that she is having some radiation of pain down her right calf to her right ankle.  She states the pain is been waking her up at night.  She has been walking with a cane.  She has been taking tylenol pain relief.  She reports that her fibromyalgia has been stable.  She denies any muscle aches or muscle tenderness at this time.  She states that she has not been sleeping well due to the pain which has been worsening her fatigue.  She states that after her last visit having a cortisone injection of the right trochanteric bursa the pain is resolved.  She states her lower back pain is also improved.   Activities of Daily Living:  Patient reports morning stiffness for 10-15  minutes.   Patient Reports nocturnal pain.  Difficulty dressing/grooming: Denies Difficulty climbing stairs: Reports Difficulty getting out of chair: Reports Difficulty using hands for taps, buttons, cutlery, and/or writing: Denies   Review of Systems  Constitutional: Positive for fatigue.  HENT: Negative for mouth sores, mouth dryness and nose dryness.   Eyes: Negative for pain, visual disturbance and dryness.  Respiratory: Negative for cough, hemoptysis, shortness of breath and difficulty breathing.   Cardiovascular: Negative for chest pain,  palpitations, hypertension and swelling in legs/feet.  Gastrointestinal: Negative for blood in stool, constipation and diarrhea.  Endocrine: Negative for increased urination.  Genitourinary: Negative for painful urination.  Musculoskeletal: Positive for arthralgias, joint pain, joint swelling and morning stiffness. Negative for myalgias, muscle weakness, muscle tenderness and myalgias.  Skin: Negative for color change, pallor, rash, hair loss, nodules/bumps, skin tightness, ulcers and sensitivity to sunlight.  Allergic/Immunologic: Negative for susceptible to infections.  Neurological: Negative for dizziness, numbness, headaches and weakness.  Hematological: Negative for swollen glands.  Psychiatric/Behavioral: Positive for sleep disturbance. Negative for depressed mood. The patient is not nervous/anxious.     PMFS History:  Patient Active Problem List   Diagnosis Date Noted  . DDD (degenerative disc disease), lumbar 05/28/2017  . CKD (chronic kidney disease) stage 3, GFR 30-59 ml/min (HCC) 04/06/2017  . Hyperglycemia 04/06/2017  . Primary osteoarthritis of both knees 11/27/2016  . Osteoarthritis of pelvis 11/27/2016  . Fibromyalgia syndrome 11/27/2016  . Fatigue 11/27/2016  . Snoring 06/28/2016  . Chronic diastolic CHF (congestive heart failure) (HCC) 01/16/2016  . Schatzki's ring 11/11/2014  . Hiatal hernia 11/11/2014  . Essential hypertension   . Dyspnea 05/05/2014  . CAP (community acquired pneumonia) 05/02/2014  . Dysuria 01/26/2014  . Atrophic vaginitis 01/26/2014  . Hyperlipidemia 07/23/2013  . Old MI (myocardial infarction)     Class: Chronic  . Nausea and vomiting 11/13/2011  . Vertigo,  benign positional 07/07/2011  . Coronary artery disease involving native coronary artery of native heart with angina pectoris (HCC) 07/07/2011  . A-fib (HCC) 07/07/2011    Past Medical History:  Diagnosis Date  . Anxiety   . Arthritis    Multiple joints  . Chronic diastolic CHF  (congestive heart failure) (HCC) 01/16/2016  . Coronary artery disease    a. s/p DES to LAD 2006. b. DES to Cx 2014. c. angiosculpt scoring balloon/DES to LAD 2016. d. DES to RCA 12/2015.  . Fibromyalgia   . GERD (gastroesophageal reflux disease)   . High cholesterol   . History of stomach ulcers    Remotely  . Hypertension   . Lower extremity edema    a. h/o chronic edema L>R.  [name] Neuropathy   . Osteoporosis   . PAF (paroxysmal atrial fibrillation) (HCC)    a. 2012 - H/P says atrial flutter, D/c summary says atrial fib - was tx with IV amiodarone, only had a few hours of arrhythmias, no clinical recurrence as of 2017.  [name] Ventricular hypertrophy   . Vertigo 08/27/2006    Family History  Problem Relation Age of Onset  . Stroke Mother   . Heart attack Father   . Cancer Sister        breast  . Cancer Son        prostate   Past Surgical History:  Procedure Laterality Date  . ABDOMINAL HYSTERECTOMY  1970's  . BREAST BIOPSY Right 1970's  . BREAST LUMPECTOMY Right 1970's   benign   . CARDIAC CATHETERIZATION  10/2006   [name] 08/03/2007 (01/27/2013)  . CARDIAC CATHETERIZATION N/A 01/01/2016   Procedure: Left Heart Cath and Coronary Angiography;  Surgeon: Kathleene Hazel, MD;  Location: [facility] INVASIVE CV LAB;  Service: Cardiovascular;  Laterality: N/A;  . CARDIAC CATHETERIZATION N/A 01/01/2016   Procedure: Coronary Stent Intervention;  Surgeon: Kathleene Hazel, MD;  Location: [facility] INVASIVE CV LAB;  Service: Cardiovascular;  Laterality: N/A;  . CORONARY ANGIOPLASTY WITH STENT PLACEMENT  06/2005; 01/27/2013    2.515 mm Cypher stent to LAD in 2006; 2.75 x 12 Promus DES to the circumflex 2014    . DIAGNOSTIC LAPAROSCOPY  2000   lap abdominal lysis of adhesions  . ESOPHAGOGASTRODUODENOSCOPY N/A 11/10/2014   Procedure: ESOPHAGOGASTRODUODENOSCOPY (EGD);  Surgeon: Jeani Hawking, MD;  Location: [facility] ENDOSCOPY;  Service: Endoscopy;  Laterality: N/A;  . HERNIA REPAIR    . LAPAROSCOPIC SALPINGO  OOPHERECTOMY  2000  . LAPAROTOMY     "day after oophorectomy; had bowel obstruction" (01/27/2013)  . LEFT HEART CATH AND CORONARY ANGIOGRAPHY N/A 04/07/2017   Procedure: LEFT HEART CATH AND CORONARY ANGIOGRAPHY;  Surgeon: Lyn Records, MD;  Location: [facility] INVASIVE CV LAB;  Service: Cardiovascular;  Laterality: N/A;  . LEFT HEART CATHETERIZATION WITH CORONARY ANGIOGRAM N/A 01/27/2013   Procedure: LEFT HEART CATHETERIZATION WITH CORONARY ANGIOGRAM;  Surgeon: Corky Crafts, MD;  Location: [facility] CATH LAB;  Service: Cardiovascular;  Laterality: N/A;  . LEFT HEART CATHETERIZATION WITH CORONARY ANGIOGRAM N/A 11/10/2014   Procedure: LEFT HEART CATHETERIZATION WITH CORONARY ANGIOGRAM;  Surgeon: Lennette Bihari, MD; left main okay, LAD 75% ISR, FFR 0.75, s/p Angiosculpt scoring balloon and 2.7518 mm Resolute DES, CFX 70%, stent patent, RCA 50%  . TONSILLECTOMY AND ADENOIDECTOMY  ~ 50   Social History   Social History Narrative  . Not on file     Objective: Vital Signs: BP 119/70 (BP Location: Left Arm, Patient Position: Sitting, Cuff Size: Normal)  Pulse 68   Resp 15   Ht 5' 6.5" (1.689 m)   Wt 214 lb (97.1 kg)   BMI 34.02 kg/m    Physical Exam  Constitutional: She is oriented to person, place, and time. She appears well-developed and well-nourished.  HENT:  Head: Normocephalic and atraumatic.  Eyes: Conjunctivae and EOM are normal.  Neck: Normal range of motion.  Cardiovascular: Normal rate, regular rhythm, normal heart sounds and intact distal pulses.  Pulmonary/Chest: Effort normal and breath sounds normal.  Abdominal: Soft. Bowel sounds are normal.  Lymphadenopathy:    She has no cervical adenopathy.  Neurological: She is alert and oriented to person, place, and time.  Skin: Skin is warm and dry. Capillary refill takes less than 2 seconds.  Psychiatric: She has a normal mood and affect. Her behavior is normal.  Nursing note and vitals reviewed.    Musculoskeletal Exam: C-spine  good ROM.  No midline spinal tenderness.  No SI joint tenderness.  Shoulder joints, elbow joints, wrist joints, MCPs, PIPs, and DIPs good ROM with no synovitis.  Hip joints, ankle joints, MTPs, PIPs, and DIPs good ROM with no synovitis. Limited ROM of right knee.  Right prepatellar bursitis noted.  Mild warmth of right knee.  Pedal edema bilaterally.   CDAI Exam: No CDAI exam completed.    Investigation: No additional findings.   Imaging: No results found.  Speciality Comments: No specialty comments available.    Procedures:  Large Joint Inj: bilateral knee on 02/20/2018 10:15 AM Indications: pain Details: 27 G 1.5 in needle, medial approach  Arthrogram: No  Medications (Right): 1.5 mL lidocaine 1 %; 40 mg triamcinolone acetonide 40 MG/ML Aspirate (Right): 0 mL Medications (Left): 1.5 mL lidocaine 1 %; 40 mg triamcinolone acetonide 40 MG/ML Aspirate (Left): 0 mL Outcome: tolerated well, no immediate complications Procedure, treatment alternatives, risks and benefits explained, specific risks discussed. Consent was given by the patient. Immediately prior to procedure a time out was called to verify the correct patient, procedure, equipment, support staff and site/side marked as required. Patient was prepped and draped in the usual sterile fashion.     Allergies: Adhesive [tape]; Crestor [rosuvastatin calcium]; Statins; Codeine; and Elavil [amitriptyline]   Assessment / Plan:     Visit Diagnoses: Primary osteoarthritis of both knees: She has limited range of motion of the right knee joint.  She has warmth and a small effusion of the right knee.  She has been having increased discomfort in bilateral knee joints for the past 3 weeks.  She is been very active which has led to increased joint pain.  She has not had any injuries or falls.  She requested bilateral cortisone injections.  She tolerated the procedure well.  She is advised to ice and elevate her knee joints.  Chronic pain of  both knees: She is been having increased pain in bilateral knee joints for the past 3 weeks.  Her right knee pain is more severe than the left.  She has warmth and a small effusion of the left knee.  She has been walking with a cane to help mobility.  Bilateral cortisone injections were performed in the office today.  She tolerated the procedure well.  Fibromyalgia syndrome: Her fibromyalgia has been stable.  She has not had any recent flares.  She has no muscle aches or muscle tenderness at this time.  She has not been sleeping well due to her bilateral knee pain so her fatigue has been worsening lately.  DDD (degenerative  disc disease), lumbar: Doing well.  No midline spinal tenderness.  Other fatigue: She is recently having worsening fatigue due to not sleeping well since her bilateral knees have been keeping her up at night.   Trochanteric bursitis, right hip: Resolved.  She did cortisone injection at her last visit on 12/09/17, which resolved her symptoms.  She has no tenderness on exam today.  Other medical conditions are listed as follows:  History of hypertension: Her blood pressure is very well controlled in the office today.  She was advised to monitor blood pressure closely upon the cortisone injection today in the office.  History of coronary artery disease - MI and stent June 2014  History of hypercholesterolemia    Orders: Orders Placed This Encounter  Procedures  . Large Joint Inj   No orders of the defined types were placed in this encounter.   Face-to-face time spent with patient was 30 minutes. >50% of time was spent in counseling and coordination of care.  Follow-Up Instructions: Return in about 6 months (around 08/22/2018) for Osteoarthritis, Fibromyalgia, DDD.   Gearldine Bienenstockaylor M Dale, PA-C   I examined and evaluated the patient with Sherron Alesaylor Dale PA.  Patient continues to have pain and discomfort in her bilateral knee joints.  She she had no  swelling or effusion in her  knee joints but discomfort range of motion on my exam.  We decided to proceed with bilateral cortisone injection to the knee joints request.  The plan of care was discussed as noted above.  Pollyann SavoyShaili Deveshwar, MD  Note - This record has been created using [employer]Dr[employer] software.  Chart creation errors have been sought, but may not always  have been located. Such creation errors do not reflect on  the standard of medical care.

## 2018-02-20 ENCOUNTER — Ambulatory Visit (INDEPENDENT_AMBULATORY_CARE_PROVIDER_SITE_OTHER): Payer: MEDICARE | Admitting: Rheumatology

## 2018-02-20 ENCOUNTER — Encounter: Payer: Self-pay | Admitting: Rheumatology

## 2018-02-20 VITALS — BP 119/70 | HR 68 | Resp 15 | Ht 66.5 in | Wt 214.0 lb

## 2018-02-20 DIAGNOSIS — R5383 Other fatigue: Secondary | ICD-10-CM

## 2018-02-20 DIAGNOSIS — Z8679 Personal history of other diseases of the circulatory system: Secondary | ICD-10-CM

## 2018-02-20 DIAGNOSIS — G8929 Other chronic pain: Secondary | ICD-10-CM

## 2018-02-20 DIAGNOSIS — M25562 Pain in left knee: Secondary | ICD-10-CM

## 2018-02-20 DIAGNOSIS — Z8639 Personal history of other endocrine, nutritional and metabolic disease: Secondary | ICD-10-CM | POA: Diagnosis not present

## 2018-02-20 DIAGNOSIS — M5136 Other intervertebral disc degeneration, lumbar region: Secondary | ICD-10-CM | POA: Diagnosis not present

## 2018-02-20 DIAGNOSIS — M7061 Trochanteric bursitis, right hip: Secondary | ICD-10-CM | POA: Diagnosis not present

## 2018-02-20 DIAGNOSIS — M797 Fibromyalgia: Secondary | ICD-10-CM

## 2018-02-20 DIAGNOSIS — M170 Bilateral primary osteoarthritis of knee: Secondary | ICD-10-CM | POA: Diagnosis not present

## 2018-02-20 DIAGNOSIS — M25561 Pain in right knee: Secondary | ICD-10-CM | POA: Diagnosis not present

## 2018-02-20 DIAGNOSIS — M51369 Other intervertebral disc degeneration, lumbar region without mention of lumbar back pain or lower extremity pain: Secondary | ICD-10-CM

## 2018-02-20 MED ORDER — TRIAMCINOLONE ACETONIDE 40 MG/ML IJ SUSP
40.0000 mg | INTRAMUSCULAR | Status: AC | PRN
  Administered 2018-02-20: 40 mg via INTRA_ARTICULAR

## 2018-02-20 MED ORDER — LIDOCAINE HCL 1 % IJ SOLN
1.5000 mL | INTRAMUSCULAR | Status: AC | PRN
  Administered 2018-02-20: 1.5 mL

## 2018-02-23 ENCOUNTER — Telehealth: Payer: Self-pay | Admitting: *Deleted

## 2018-02-23 NOTE — Telephone Encounter (Signed)
Spoke with Mrs. Terrero about the ending of the [employer] research study to her participation.  Subject has started on PCSK9i which is prohibited in the study. She has been off the study drug since last year due to a possibly related adverse event.  She would like to be kept on the database of the Research department for future consideration of other trials.

## 2018-05-22 ENCOUNTER — Ambulatory Visit (INDEPENDENT_AMBULATORY_CARE_PROVIDER_SITE_OTHER): Payer: Medicare HMO | Admitting: Interventional Cardiology

## 2018-05-22 ENCOUNTER — Encounter: Payer: Self-pay | Admitting: Interventional Cardiology

## 2018-05-22 ENCOUNTER — Encounter

## 2018-05-22 VITALS — BP 132/72 | HR 64 | Ht 67.0 in | Wt 214.8 lb

## 2018-05-22 DIAGNOSIS — I480 Paroxysmal atrial fibrillation: Secondary | ICD-10-CM | POA: Diagnosis not present

## 2018-05-22 DIAGNOSIS — I5032 Chronic diastolic (congestive) heart failure: Secondary | ICD-10-CM

## 2018-05-22 DIAGNOSIS — I25119 Atherosclerotic heart disease of native coronary artery with unspecified angina pectoris: Secondary | ICD-10-CM

## 2018-05-22 DIAGNOSIS — I10 Essential (primary) hypertension: Secondary | ICD-10-CM | POA: Diagnosis not present

## 2018-05-22 DIAGNOSIS — E785 Hyperlipidemia, unspecified: Secondary | ICD-10-CM

## 2018-05-22 MED ORDER — NITROGLYCERIN 0.4 MG SL SUBL: 0.4000 mg | SUBLINGUAL_TABLET | SUBLINGUAL | 5 refills | Status: AC | PRN

## 2018-05-22 NOTE — Patient Instructions (Signed)
Medication Instructions:  Your physician recommends that you continue on your current medications as directed. Please refer to the Current Medication list given to you today.  Labwork: None  Testing/Procedures: None  Follow-Up: Your physician wants you to follow-up in: 4 months with Dr. Katrinka BlazingSmith.  You will receive a reminder letter in the mail two months in advance. If you don't receive a letter, please call our office to schedule the follow-up appointment.   Any Other Special Instructions Will Be Listed Below (If Applicable).  Please be sure to establish with a Primary Care Physician and a Cardiologist in your area.    If you need a refill on your cardiac medications before your next appointment, please call your pharmacy.

## 2018-05-22 NOTE — Progress Notes (Signed)
Cardiology Office Note:    Date:  05/22/2018   ID:  [name], DOB Mar 16, 1936, MRN [id number]  PCP:  Andi Devon, MD  Cardiologist:  Lesleigh Noe, MD   Referring MD: Andi Devon, MD   Chief Complaint  Patient presents with  . Coronary Artery Disease    History of Present Illness:    [name] is a 82 y.o. female with a hx of  h/o CAD (s/p DES to LAD 2006, DES to Cx 2014, angiosculpt scoring balloon/DES to LAD 2016, DES to RCA 12/2015), chronic diastolic CHF, HTN, atrial fib/flutter, hyperlipidemia, pre-diabetes, chronic LEE (L>R), fibromyalgia, anxiety, remote h/o stomach ulcers.   She denies angina.  They have moved to [facility] at the request of children.  She has not had significant angina.  She did have one episode of dyspnea during the move.  She look for nitroglycerin tablets and could not find them.  This was 2 months ago.  She has not required nitroglycerin since that time.  This particular episode of dyspnea resolved spontaneously.  No palpitations.  No orthopnea.  No peripheral edema.  Past Medical History:  Diagnosis Date  . Anxiety   . Arthritis    Multiple joints  . Chronic diastolic CHF (congestive heart failure) (HCC) 01/16/2016  . Coronary artery disease    a. s/p DES to LAD 2006. b. DES to Cx 2014. c. angiosculpt scoring balloon/DES to LAD 2016. d. DES to RCA 12/2015.  . Fibromyalgia   . GERD (gastroesophageal reflux disease)   . High cholesterol   . History of stomach ulcers    Remotely  . Hypertension   . Lower extremity edema    a. h/o chronic edema L>R.  [name] Neuropathy   . Osteoporosis   . PAF (paroxysmal atrial fibrillation) (HCC)    a. 2012 - H/P says atrial flutter, D/c summary says atrial fib - was tx with IV amiodarone, only had a few hours of arrhythmias, no clinical recurrence as of 2017.  [name] Ventricular hypertrophy   . Vertigo 08/27/2006    Past Surgical History:  Procedure Laterality Date  .  ABDOMINAL HYSTERECTOMY  1970's  . BREAST BIOPSY Right 1970's  . BREAST LUMPECTOMY Right 1970's   benign   . CARDIAC CATHETERIZATION  10/2006   [name] 08/03/2007 (01/27/2013)  . CARDIAC CATHETERIZATION N/A 01/01/2016   Procedure: Left Heart Cath and Coronary Angiography;  Surgeon: Kathleene Hazel, MD;  Location: [facility] INVASIVE CV LAB;  Service: Cardiovascular;  Laterality: N/A;  . CARDIAC CATHETERIZATION N/A 01/01/2016   Procedure: Coronary Stent Intervention;  Surgeon: Kathleene Hazel, MD;  Location: [facility] INVASIVE CV LAB;  Service: Cardiovascular;  Laterality: N/A;  . CORONARY ANGIOPLASTY WITH STENT PLACEMENT  06/2005; 01/27/2013    2.515 mm Cypher stent to LAD in 2006; 2.75 x 12 Promus DES to the circumflex 2014    . DIAGNOSTIC LAPAROSCOPY  2000   lap abdominal lysis of adhesions  . ESOPHAGOGASTRODUODENOSCOPY N/A 11/10/2014   Procedure: ESOPHAGOGASTRODUODENOSCOPY (EGD);  Surgeon: Jeani Hawking, MD;  Location: [facility] ENDOSCOPY;  Service: Endoscopy;  Laterality: N/A;  . HERNIA REPAIR    . LAPAROSCOPIC SALPINGO OOPHERECTOMY  2000  . LAPAROTOMY     "day after oophorectomy; had bowel obstruction" (01/27/2013)  . LEFT HEART CATH AND CORONARY ANGIOGRAPHY N/A 04/07/2017   Procedure: LEFT HEART CATH AND CORONARY ANGIOGRAPHY;  Surgeon: Lyn Records, MD;  Location: [facility] INVASIVE CV LAB;  Service: Cardiovascular;  Laterality: N/A;  . LEFT  HEART CATHETERIZATION WITH CORONARY ANGIOGRAM N/A 01/27/2013   Procedure: LEFT HEART CATHETERIZATION WITH CORONARY ANGIOGRAM;  Surgeon: Corky CraftsJayadeep S Varanasi, MD;  [facility];  Service: Cardiovascular;  Laterality: N/A;  . LEFT HEART CATHETERIZATION WITH CORONARY ANGIOGRAM N/A 11/10/2014   Procedure: LEFT HEART CATHETERIZATION WITH CORONARY ANGIOGRAM;  Surgeon: Lennette Biharihomas A Kelly, MD; left main okay, LAD 75% ISR, FFR 0.75, s/p Angiosculpt scoring balloon and 2.7518 mm Resolute DES, CFX 70%, stent patent, RCA 50%  . TONSILLECTOMY AND ADENOIDECTOMY  ~ 1952    Current  Medications: Current Meds  Medication Sig  . acetaminophen (TYLENOL) 500 MG tablet Take 500 mg by mouth daily as needed (pain).  [name]. albuterol (PROVENTIL HFA;VENTOLIN HFA) 108 (90 Base) MCG/ACT inhaler Inhale 2 puffs into the lungs as directed.  . Alirocumab (PRALUENT) 75 MG/ML SOPN Inject 75 mg into the skin every 14 (fourteen) days.  . ALPRAZolam (XANAX) 0.25 MG tablet Take 0.25 mg by mouth 2 (two) times daily as needed for anxiety. anxiety  . aspirin EC 81 MG tablet Take 81 mg by mouth daily.    . Cholecalciferol (VITAMIN D PO) Take 1 tablet by mouth daily.   . clopidogrel (PLAVIX) 75 MG tablet take 1 tablet by mouth once daily  . Cyanocobalamin (B-12 PO) Take 1 tablet by mouth 3 (three) times a week.  . Diphenhydramine-APAP, sleep, (TYLENOL PM EXTRA STRENGTH PO) Take 0.5 tablets by mouth at bedtime as needed. For sleep  . fluticasone (FLONASE) 50 MCG/ACT nasal spray Place 2 sprays into both nostrils daily as needed for allergies or rhinitis.  [name]. loratadine (CLARITIN) 10 MG tablet Take 10 mg by mouth daily. Reported on 12/29/2015  . metoprolol tartrate (LOPRESSOR) 25 MG tablet Take 1 tablet (25 mg total) by mouth 2 (two) times daily. Patient needs to call and schedule six month follow up appt per last office visit  . nitroGLYCERIN (NITROSTAT) 0.4 MG SL tablet Place 1 tablet (0.4 mg total) under the tongue every 5 (five) minutes as needed for chest pain. For 3 doses  . pantoprazole (PROTONIX) 40 MG tablet take 1 tablet by mouth once daily AT 6 AM  . potassium chloride SA (K-DUR,KLOR-CON) 20 MEQ tablet Take 20 mEq by mouth 4 (four) times a week. (Tuesdays, Thursdays, Saturdays, and Sundays)  . pregabalin (LYRICA) 75 MG capsule Take 75 mg by mouth 2 (two) times daily.  . Probiotic Product (ALIGN PO) Take 1 tablet by mouth daily.  [name]. torsemide (DEMADEX) 20 MG tablet Take 20 mg by mouth daily.   [name]. trolamine salicylate (ASPERCREME) 10 % cream Apply 1 application topically as needed for muscle pain.  .  valsartan (DIOVAN) 160 MG tablet Take 160 mg by mouth daily.   . [DISCONTINUED] nitroGLYCERIN (NITROSTAT) 0.4 MG SL tablet Place 1 tablet (0.4 mg total) under the tongue every 5 (five) minutes as needed for chest pain. For 3 doses     Allergies:   Adhesive [tape]; Crestor [rosuvastatin calcium]; Statins; Codeine; and Elavil [amitriptyline]   Social History   Socioeconomic History  . Marital status: Married    Spouse name: Not on file  . Number of children: 3  . Years of education: masters  . Highest education level: Not on file  Occupational History  . Occupation: retired  Engineer, productionocial Needs  . Financial resource strain: Not on file  . Food insecurity:    Worry: Not on file    Inability: Not on file  . Transportation needs:    Medical: Not on  file    Non-medical: Not on file  Tobacco Use  . Smoking status: Former Smoker    Packs/day: 0.12    Years: 36.00    Pack years: 4.32    Types: Cigarettes    Last attempt to quit: 12/02/1991    Years since quitting: 26.4  . Smokeless tobacco: Never Used  Substance and Sexual Activity  . Alcohol use: No  . Drug use: Never  . Sexual activity: Not Currently    Birth control/protection: Surgical  Lifestyle  . Physical activity:    Days per week: Not on file    Minutes per session: Not on file  . Stress: Not on file  Relationships  . Social connections:    Talks on phone: Not on file    Gets together: Not on file    Attends religious service: Not on file    Active member of club or organization: Not on file    Attends meetings of clubs or organizations: Not on file    Relationship status: Not on file  Other Topics Concern  . Not on file  Social History Narrative  . Not on file     Family History: The patient's family history includes Cancer in her sister and son; Heart attack in her father; Stroke in her mother.  ROS:   Please see the history of present illness.    Difficulty with balance, muscle pain, back pain is improved.  No  syncope.  No medication side effects.  She is happy with the move that they have made.  All other systems reviewed and are negative.  EKGs/Labs/Other Studies Reviewed:    The following studies were reviewed today: No new data  EKG:  EKG is  ordered today.  The ekg ordered today demonstrates normal sinus rhythm with nonspecific T wave flattening.  Recent Labs: No results found for requested labs within last 8760 hours.  Recent Lipid Panel    Component Value Date/Time   CHOL 271 (H) 12/30/2015 0000   TRIG 90 12/30/2015 0000   HDL 49 12/30/2015 0000   CHOLHDL 5.5 12/30/2015 0000   VLDL 18 12/30/2015 0000   LDLCALC 204 (H) 12/30/2015 0000    Physical Exam:    VS:  BP 132/72   Pulse 64   Ht 5\' 7"  (1.702 m)   Wt 214 lb 12.8 oz (97.4 kg)   BMI 33.64 kg/m     Wt Readings from Last 3 Encounters:  05/22/18 214 lb 12.8 oz (97.4 kg)  02/20/18 214 lb (97.1 kg)  01/08/18 207 lb (93.9 kg)     GEN: Mild obesity.  Well nourished, well developed in no acute distress HEENT: Normal NECK: No JVD. LYMPHATICS: No lymphadenopathy CARDIAC: RRR, no murmur, no gallop, no edema. VASCULAR: 2+ and symmetric radial pulses.  No bruits. RESPIRATORY:  Clear to auscultation without rales, wheezing or rhonchi  ABDOMEN: Soft, non-tender, non-distended, No pulsatile mass, MUSCULOSKELETAL: No deformity  SKIN: Warm and dry NEUROLOGIC:  Alert and oriented x 3 PSYCHIATRIC:  Normal affect   ASSESSMENT:    1. Coronary artery disease involving native coronary artery of native heart with angina pectoris (HCC)   2. Chronic diastolic CHF (congestive heart failure) (HCC)   3. Essential hypertension   4. Paroxysmal atrial fibrillation (HCC)   5. Hyperlipidemia, unspecified hyperlipidemia type    PLAN:    In order of problems listed above:  1. Stable coronary disease without angina.  Chest discomfort has been a feature associated with coronary disease.  Dyspnea occasionally occurs but she has no specific  complaints and nothing that sounds as if she is having progressive difficulty.  Continue aspirin and Plavix for the time being.  Upon return in 4 months if no recurrence of angina will discontinue Plavix. 2. Stable without orthopnea or lower extremity swelling. 3. Adequate blood pressure control.  Continue to monitor salt intake. 4. No recurrence. 5. LDL target less than 70.  Continue evolocumab and Zetia.  Last LDL was 108 and February 2019.  Advised that they need to find a cardiologist in the [place] area.  We will be happy to continue to follow them when they are in the [place] area visiting.  Medical follow-up in 4 months.   Medication Adjustments/Labs and Tests Ordered: Current medicines are reviewed at length with the patient today.  Concerns regarding medicines are outlined above.  Orders Placed This Encounter  Procedures  . EKG 12-Lead   Meds ordered this encounter  Medications  . nitroGLYCERIN (NITROSTAT) 0.4 MG SL tablet    Sig: Place 1 tablet (0.4 mg total) under the tongue every 5 (five) minutes as needed for chest pain. For 3 doses    Dispense:  25 tablet    Refill:  5    Patient Instructions  Medication Instructions:  Your physician recommends that you continue on your current medications as directed. Please refer to the Current Medication list given to you today.  Labwork: None  Testing/Procedures: None  Follow-Up: Your physician wants you to follow-up in: 4 months with Dr. Katrinka Blazing.  You will receive a reminder letter in the mail two months in advance. If you don't receive a letter, please call our office to schedule the follow-up appointment.   Any Other Special Instructions Will Be Listed Below (If Applicable).  Please be sure to establish with a Primary Care Physician and a Cardiologist in your area.    If you need a refill on your cardiac medications before your next appointment, please call your pharmacy.      Signed, Lesleigh Noe, MD    05/22/2018 3:08 PM    [facility]

## 2018-05-29 NOTE — Progress Notes (Signed)
Office Visit Note  Patient: [name]             Date of Birth: [birth year]           MRN: [id number]             PCP: Andi Devon, MD Referring: Andi Devon, MD Visit Date: 06/12/2018 Occupation: @GUAROCC @  Subjective:  Right trochanteric bursitis   History of Present Illness: [name] is a 82 y.o. female with history of osteoarthritis, fibromyalgia, and DDD.  Patient continues to have generalized muscle aches muscle tenderness due to fibromyalgia.  She states that she is in the process of moving she has been causing increased discomfort.  She states that she had a fibromyalgia flare about 1 month ago.  She presents today with right trochanter bursitis and left knee pain.  She states that she has noticed some swelling in her left knee joint.  She states that she feels as though her morning stiffness has been lasting longer 1 to 2 hours.  She states she continues take Lyrica 75 mg twice daily and would like a refill today.    Activities of Daily Living:  Patient reports morning stiffness for 1-2  hours.   Patient Reports nocturnal pain.  Difficulty dressing/grooming: Denies Difficulty climbing stairs: Reports Difficulty getting out of chair: Reports Difficulty using hands for taps, buttons, cutlery, and/or writing: Denies  Review of Systems  Constitutional: Positive for fatigue.  HENT: Negative for mouth sores, mouth dryness and nose dryness.   Eyes: Negative for pain, visual disturbance and dryness.  Respiratory: Negative for cough, hemoptysis, shortness of breath and difficulty breathing.   Cardiovascular: Negative for chest pain, palpitations, hypertension and swelling in legs/feet.  Gastrointestinal: Negative for blood in stool, constipation and diarrhea.  Endocrine: Negative for increased urination.  Genitourinary: Negative for painful urination.  Musculoskeletal: Positive for arthralgias, joint pain, myalgias, morning stiffness, muscle  tenderness and myalgias. Negative for joint swelling and muscle weakness.  Skin: Negative for color change, pallor, rash, hair loss, nodules/bumps, skin tightness, ulcers and sensitivity to sunlight.  Allergic/Immunologic: Negative for susceptible to infections.  Neurological: Negative for dizziness, numbness, headaches and weakness.  Hematological: Negative for swollen glands.  Psychiatric/Behavioral: Negative for depressed mood and sleep disturbance. The patient is not nervous/anxious.     PMFS History:  Patient Active Problem List   Diagnosis Date Noted  . DDD (degenerative disc disease), lumbar 05/28/2017  . CKD (chronic kidney disease) stage 3, GFR 30-59 ml/min (HCC) 04/06/2017  . Hyperglycemia 04/06/2017  . Primary osteoarthritis of both knees 11/27/2016  . Osteoarthritis of pelvis 11/27/2016  . Fibromyalgia syndrome 11/27/2016  . Fatigue 11/27/2016  . Snoring 06/28/2016  . Chronic diastolic CHF (congestive heart failure) (HCC) 01/16/2016  . Schatzki's ring 11/11/2014  . Hiatal hernia 11/11/2014  . Essential hypertension   . Dyspnea 05/05/2014  . CAP (community acquired pneumonia) 05/02/2014  . Dysuria 01/26/2014  . Atrophic vaginitis 01/26/2014  . Hyperlipidemia 07/23/2013  . Old MI (myocardial infarction)     Class: Chronic  . Nausea and vomiting 11/13/2011  . Vertigo, benign positional 07/07/2011  . Coronary artery disease involving native coronary artery of native heart with angina pectoris (HCC) 07/07/2011  . A-fib (HCC) 07/07/2011    Past Medical History:  Diagnosis Date  . Anxiety   . Arthritis    Multiple joints  . Chronic diastolic CHF (congestive heart failure) (HCC) 01/16/2016  . Coronary artery disease    a. s/p DES to LAD  2006. b. DES to Cx 2014. c. angiosculpt scoring balloon/DES to LAD 2016. d. DES to RCA 12/2015.  . Fibromyalgia   . GERD (gastroesophageal reflux disease)   . High cholesterol   . History of stomach ulcers    Remotely  . Hypertension    . Lower extremity edema    a. h/o chronic edema L>R.  [name] Neuropathy   . Osteoporosis   . PAF (paroxysmal atrial fibrillation) (HCC)    a. 2012 - H/P says atrial flutter, D/c summary says atrial fib - was tx with IV amiodarone, only had a few hours of arrhythmias, no clinical recurrence as of 2017.  [name] Ventricular hypertrophy   . Vertigo 08/27/2006    Family History  Problem Relation Age of Onset  . Stroke Mother   . Heart attack Father   . Cancer Sister        breast  . Cancer Son        prostate   Past Surgical History:  Procedure Laterality Date  . ABDOMINAL HYSTERECTOMY  1970's  . BREAST BIOPSY Right 1970's  . BREAST LUMPECTOMY Right 1970's   benign   . CARDIAC CATHETERIZATION  10/2006   [name] 08/03/2007 (01/27/2013)  . CARDIAC CATHETERIZATION N/A 01/01/2016   Procedure: Left Heart Cath and Coronary Angiography;  Surgeon: Kathleene Hazel, MD;  Location: [facility] INVASIVE CV LAB;  Service: Cardiovascular;  Laterality: N/A;  . CARDIAC CATHETERIZATION N/A 01/01/2016   Procedure: Coronary Stent Intervention;  Surgeon: Kathleene Hazel, MD;  Location: [facility] INVASIVE CV LAB;  Service: Cardiovascular;  Laterality: N/A;  . CORONARY ANGIOPLASTY WITH STENT PLACEMENT  06/2005; 01/27/2013    2.515 mm Cypher stent to LAD in 2006; 2.75 x 12 Promus DES to the circumflex 2014    . DIAGNOSTIC LAPAROSCOPY  2000   lap abdominal lysis of adhesions  . ESOPHAGOGASTRODUODENOSCOPY N/A 11/10/2014   Procedure: ESOPHAGOGASTRODUODENOSCOPY (EGD);  Surgeon: Jeani Hawking, MD;  Location: [facility] ENDOSCOPY;  Service: Endoscopy;  Laterality: N/A;  . HERNIA REPAIR    . LAPAROSCOPIC SALPINGO OOPHERECTOMY  2000  . LAPAROTOMY     "day after oophorectomy; had bowel obstruction" (01/27/2013)  . LEFT HEART CATH AND CORONARY ANGIOGRAPHY N/A 04/07/2017   Procedure: LEFT HEART CATH AND CORONARY ANGIOGRAPHY;  Surgeon: Lyn Records, MD;  Location: [facility] INVASIVE CV LAB;  Service: Cardiovascular;  Laterality: N/A;  . LEFT HEART  CATHETERIZATION WITH CORONARY ANGIOGRAM N/A 01/27/2013   Procedure: LEFT HEART CATHETERIZATION WITH CORONARY ANGIOGRAM;  Surgeon: Corky Crafts, MD;  Location: [facility] CATH LAB;  Service: Cardiovascular;  Laterality: N/A;  . LEFT HEART CATHETERIZATION WITH CORONARY ANGIOGRAM N/A 11/10/2014   Procedure: LEFT HEART CATHETERIZATION WITH CORONARY ANGIOGRAM;  Surgeon: Lennette Bihari, MD; left main okay, LAD 75% ISR, FFR 0.75, s/p Angiosculpt scoring balloon and 2.7518 mm Resolute DES, CFX 70%, stent patent, RCA 50%  . TONSILLECTOMY AND ADENOIDECTOMY  ~ 80   Social History   Social History Narrative  . Not on file    Objective: Vital Signs: BP 123/69 (BP Location: Left Arm, Patient Position: Sitting, Cuff Size: Large)   Pulse 71   Resp 15   Ht 5\' 7"  (1.702 m)   Wt 218 lb (98.9 kg)   BMI 34.14 kg/m    Physical Exam  Constitutional: She is oriented to person, place, and time. She appears well-developed and well-nourished.  HENT:  Head: Normocephalic and atraumatic.  Eyes: Conjunctivae and EOM are normal.  Neck: Normal range of motion.  Cardiovascular: Normal  rate, regular rhythm, normal heart sounds and intact distal pulses.  Pulmonary/Chest: Effort normal and breath sounds normal.  Abdominal: Soft. Bowel sounds are normal.  Lymphadenopathy:    She has no cervical adenopathy.  Neurological: She is alert and oriented to person, place, and time.  Skin: Skin is warm and dry. Capillary refill takes less than 2 seconds.  Psychiatric: She has a normal mood and affect. Her behavior is normal.  Nursing note and vitals reviewed.    Musculoskeletal Exam: C-spine slightly limited ROM.  Thoracic and lumbar spine good ROM.  No midline spinal tenderness.  No SI joint tenderness.  Shoulder joints, elbow joints, wrist joints, MCPs, PIPs, DIPs good range of motion with no synovitis.  She has complete fist formation bilaterally.  Hip joints good range of motion with no discomfort.  Tenderness of right  trochanteric bursa.  Knee joints good range of motion.  No warmth or effusion noted.  She has discomfort with left knee range of motion.  No ankle joint tenderness or swelling noted.  MTP, PIP, DIP good range of motion with no synovitis.  She has pitting edema bilaterally.  CDAI Exam: CDAI Score: Not documented Patient Global Assessment: Not documented; Provider Global Assessment: Not documented Swollen: Not documented; Tender: Not documented Joint Exam   Not documented   There is currently no information documented on the homunculus. Go to the Rheumatology activity and complete the homunculus joint exam.  Investigation: No additional findings.  Imaging: No results found.  Recent Labs: Lab Results  Component Value Date   WBC 4.1 04/08/2017   HGB 10.4 (L) 04/08/2017   PLT 254 04/08/2017   NA 143 04/08/2017   K 3.6 04/08/2017   CL 108 04/08/2017   CO2 27 04/08/2017   GLUCOSE 101 (H) 04/08/2017   BUN 12 04/08/2017   CREATININE 0.99 04/08/2017   BILITOT 0.2 (L) 01/08/2017   ALKPHOS 76 01/08/2017   AST 22 01/08/2017   ALT 17 01/08/2017   PROT 6.7 01/08/2017   ALBUMIN 3.8 01/08/2017   CALCIUM 9.2 04/08/2017   GFRAA >60 04/08/2017    Speciality Comments: No specialty comments available.  Procedures:  Large Joint Inj: R greater trochanter on 06/12/2018 11:16 AM Indications: pain Details: 27 G 1.5 in needle, lateral approach  Arthrogram: No  Medications: 1 mL lidocaine 1 %; 40 mg triamcinolone acetonide 40 MG/ML Aspirate: 0 mL Outcome: tolerated well, no immediate complications Procedure, treatment alternatives, risks and benefits explained, specific risks discussed. Consent was given by the patient. Immediately prior to procedure a time out was called to verify the correct patient, procedure, equipment, support staff and site/side marked as required. Patient was prepped and draped in the usual sterile fashion.     Allergies: Adhesive [tape]; Crestor [rosuvastatin  calcium]; Statins; Codeine; and Elavil [amitriptyline]   Assessment / Plan:     Visit Diagnoses: Primary osteoarthritis of both knees: She continues to have discomfort in her left knee joint.  No warmth or effusion was noted.  She has discomfort with left knee range of motion.  No mechanical symptoms.  She is advised to call the office back if she would like to schedule an injection in the left knee joint.  She was advised to use Voltaren gel 3 times daily as needed.  She can also ice, elevate, and use compression for symptomatic relief.  Fibromyalgia syndrome: She has generalized muscle aches muscle tenderness due to fibromyalgia.  She is been in the process of moving she has been causing  a fibromyalgia flare more frequently.  Her last flare was about 1 month ago.  She presents today with right trochanter bursitis.  She requested a cortisone injection today in the office.  She tolerated the procedure well.  Potential side effects were discussed.  The procedure note is completed above.  She has been taking Lyrica 75 mg by mouth BID, and she is requesting a refill.  She is encouraged to stay active and exercise on a regular basis.  DDD (degenerative disc disease), lumbar: Chronic pain.  No midline spinal tenderness.  Other fatigue: Chronic.  Trochanteric bursitis, right hip: She has tenderness in the right trochanter bursa on exam.  She requested a cortisone injection today in the office.  She tolerated the procedure well.  Procedure note as listed above.  History of hypertension: She is advised to monitor blood pressure closely upon the cortisone injection.  Other medical conditions are listed as follows:  History of coronary artery disease  History of hypercholesterolemia  History of heart attack  Other insomnia  Baker's cyst of knee, left   Orders: Orders Placed This Encounter  Procedures  . Large Joint Inj   Meds ordered this encounter  Medications  . pregabalin (LYRICA) 75 MG  capsule    Sig: Take 1 capsule (75 mg total) by mouth 2 (two) times daily.    Dispense:  60 capsule    Refill:  0    Face-to-face time spent with patient was 30 minutes. Greater than 50% of time was spent in counseling and coordination of care.  Follow-Up Instructions: Return in about 6 months (around 12/12/2018) for Osteoarthritis, Fibromyalgia, DDD.   Gearldine Bienenstock, PA-C  Note - This record has been created using Dragon software.  Chart creation errors have been sought, but may not always  have been located. Such creation errors do not reflect on  the standard of medical care.

## 2018-06-12 ENCOUNTER — Encounter: Payer: Self-pay | Admitting: Physician Assistant

## 2018-06-12 ENCOUNTER — Ambulatory Visit: Payer: Medicare HMO | Admitting: Physician Assistant

## 2018-06-12 ENCOUNTER — Ambulatory Visit: Payer: BC Managed Care – PPO | Admitting: Rheumatology

## 2018-06-12 VITALS — BP 123/69 | HR 71 | Resp 15 | Ht 67.0 in | Wt 218.0 lb

## 2018-06-12 DIAGNOSIS — R5383 Other fatigue: Secondary | ICD-10-CM

## 2018-06-12 DIAGNOSIS — M7061 Trochanteric bursitis, right hip: Secondary | ICD-10-CM | POA: Diagnosis not present

## 2018-06-12 DIAGNOSIS — I252 Old myocardial infarction: Secondary | ICD-10-CM

## 2018-06-12 DIAGNOSIS — M5136 Other intervertebral disc degeneration, lumbar region: Secondary | ICD-10-CM

## 2018-06-12 DIAGNOSIS — M797 Fibromyalgia: Secondary | ICD-10-CM | POA: Diagnosis not present

## 2018-06-12 DIAGNOSIS — Z87898 Personal history of other specified conditions: Secondary | ICD-10-CM

## 2018-06-12 DIAGNOSIS — M170 Bilateral primary osteoarthritis of knee: Secondary | ICD-10-CM | POA: Diagnosis not present

## 2018-06-12 DIAGNOSIS — Z8679 Personal history of other diseases of the circulatory system: Secondary | ICD-10-CM

## 2018-06-12 DIAGNOSIS — G4709 Other insomnia: Secondary | ICD-10-CM

## 2018-06-12 DIAGNOSIS — M7122 Synovial cyst of popliteal space [Baker], left knee: Secondary | ICD-10-CM

## 2018-06-12 DIAGNOSIS — M51369 Other intervertebral disc degeneration, lumbar region without mention of lumbar back pain or lower extremity pain: Secondary | ICD-10-CM

## 2018-06-12 DIAGNOSIS — Z8639 Personal history of other endocrine, nutritional and metabolic disease: Secondary | ICD-10-CM

## 2018-06-12 MED ORDER — LIDOCAINE HCL 1 % IJ SOLN
1.0000 mL | INTRAMUSCULAR | Status: AC | PRN
  Administered 2018-06-12: 1 mL

## 2018-06-12 MED ORDER — TRIAMCINOLONE ACETONIDE 40 MG/ML IJ SUSP
40.0000 mg | INTRAMUSCULAR | Status: AC | PRN
  Administered 2018-06-12: 40 mg via INTRA_ARTICULAR

## 2018-06-12 MED ORDER — PREGABALIN 75 MG PO CAPS: 75.0000 mg | ORAL_CAPSULE | Freq: Two times a day (BID) | ORAL | 0 refills | Status: DC

## 2018-06-29 ENCOUNTER — Other Ambulatory Visit: Payer: Self-pay | Admitting: Interventional Cardiology

## 2018-08-11 ENCOUNTER — Other Ambulatory Visit: Payer: Self-pay | Admitting: Physician Assistant

## 2018-09-30 NOTE — Progress Notes (Signed)
Office Visit Note  Patient: [name]             Date of Birth: [birth year]           MRN: [id number]             PCP: Andi DevonShelton, Kimberly, MD Referring: Andi DevonShelton, Kimberly, MD Visit Date: 10/01/2018 Occupation: @GUAROCC @  Subjective:  Pain in bilateral knee pain.   History of Present Illness: [name] is a 83 y.o. female with history of osteoarthritis, degenerative disc disease and fibromyalgia  Activities of Daily Living:  Patient reports morning stiffness for 2 minutes.   Patient Denies nocturnal pain.  Difficulty dressing/grooming: Reports Difficulty climbing stairs: Reports Difficulty getting out of chair: Reports Difficulty using hands for taps, buttons, cutlery, and/or writing: Denies  Review of Systems  Constitutional: Positive for fatigue. Negative for night sweats, weight gain and weight loss.  HENT: Negative for mouth sores, trouble swallowing, trouble swallowing, mouth dryness and nose dryness.   Eyes: Positive for dryness. Negative for pain, redness and visual disturbance.  Respiratory: Negative for cough, shortness of breath and difficulty breathing.   Cardiovascular: Negative for chest pain, palpitations, hypertension, irregular heartbeat and swelling in legs/feet.  Gastrointestinal: Negative for blood in stool, constipation and diarrhea.  Endocrine: Negative for increased urination.  Genitourinary: Negative for vaginal dryness.  Musculoskeletal: Positive for arthralgias, joint pain, myalgias, morning stiffness and myalgias. Negative for joint swelling, muscle weakness and muscle tenderness.  Skin: Negative for color change, rash, hair loss, skin tightness, ulcers and sensitivity to sunlight.  Allergic/Immunologic: Negative for susceptible to infections.  Neurological: Negative for dizziness, memory loss, night sweats and weakness.  Hematological: Negative for swollen glands.  Psychiatric/Behavioral: Negative for depressed mood and sleep  disturbance. The patient is not nervous/anxious.     PMFS History:  Patient Active Problem List   Diagnosis Date Noted  . DDD (degenerative disc disease), lumbar 05/28/2017  . CKD (chronic kidney disease) stage 3, GFR 30-59 ml/min (HCC) 04/06/2017  . Hyperglycemia 04/06/2017  . Primary osteoarthritis of both knees 11/27/2016  . Osteoarthritis of pelvis 11/27/2016  . Fibromyalgia syndrome 11/27/2016  . Fatigue 11/27/2016  . Snoring 06/28/2016  . Chronic diastolic CHF (congestive heart failure) (HCC) 01/16/2016  . Schatzki's ring 11/11/2014  . Hiatal hernia 11/11/2014  . Essential hypertension   . Dyspnea 05/05/2014  . CAP (community acquired pneumonia) 05/02/2014  . Dysuria 01/26/2014  . Atrophic vaginitis 01/26/2014  . Hyperlipidemia 07/23/2013  . Old MI (myocardial infarction)     Class: Chronic  . Nausea and vomiting 11/13/2011  . Vertigo, benign positional 07/07/2011  . Coronary artery disease involving native coronary artery of native heart with angina pectoris (HCC) 07/07/2011  . A-fib (HCC) 07/07/2011    Past Medical History:  Diagnosis Date  . Anxiety   . Arthritis    Multiple joints  . Chronic diastolic CHF (congestive heart failure) (HCC) 01/16/2016  . Coronary artery disease    a. s/p DES to LAD 2006. b. DES to Cx 2014. c. angiosculpt scoring balloon/DES to LAD 2016. d. DES to RCA 12/2015.  . Fibromyalgia   . GERD (gastroesophageal reflux disease)   . High cholesterol   . History of stomach ulcers    Remotely  . Hypertension   . Lower extremity edema    a. h/o chronic edema L>R.  [name]. Neuropathy   . Osteoporosis   . PAF (paroxysmal atrial fibrillation) (HCC)    a. 2012 - H/P says atrial flutter, D/c  summary says atrial fib - was tx with IV amiodarone, only had a few hours of arrhythmias, no clinical recurrence as of 2017.  [name] Ventricular hypertrophy   . Vertigo 08/27/2006    Family History  Problem Relation Age of Onset  . Stroke Mother   . Heart attack  Father   . Cancer Sister        breast  . Cancer Son        prostate   Past Surgical History:  Procedure Laterality Date  . ABDOMINAL HYSTERECTOMY  1970's  . BREAST BIOPSY Right 1970's  . BREAST LUMPECTOMY Right 1970's   benign   . CARDIAC CATHETERIZATION  10/2006   [name] 08/03/2007 (01/27/2013)  . CARDIAC CATHETERIZATION N/A 01/01/2016   Procedure: Left Heart Cath and Coronary Angiography;  Surgeon: Kathleene Hazel, MD;  Location: [facility] INVASIVE CV LAB;  Service: Cardiovascular;  Laterality: N/A;  . CARDIAC CATHETERIZATION N/A 01/01/2016   Procedure: Coronary Stent Intervention;  Surgeon: Kathleene Hazel, MD;  Location: [facility] INVASIVE CV LAB;  Service: Cardiovascular;  Laterality: N/A;  . CORONARY ANGIOPLASTY WITH STENT PLACEMENT  06/2005; 01/27/2013    2.515 mm Cypher stent to LAD in 2006; 2.75 x 12 Promus DES to the circumflex 2014    . DIAGNOSTIC LAPAROSCOPY  2000   lap abdominal lysis of adhesions  . ESOPHAGOGASTRODUODENOSCOPY N/A 11/10/2014   Procedure: ESOPHAGOGASTRODUODENOSCOPY (EGD);  Surgeon: Jeani Hawking, MD;  Location: [facility] ENDOSCOPY;  Service: Endoscopy;  Laterality: N/A;  . HERNIA REPAIR    . LAPAROSCOPIC SALPINGO OOPHERECTOMY  2000  . LAPAROTOMY     "day after oophorectomy; had bowel obstruction" (01/27/2013)  . LEFT HEART CATH AND CORONARY ANGIOGRAPHY N/A 04/07/2017   Procedure: LEFT HEART CATH AND CORONARY ANGIOGRAPHY;  Surgeon: Lyn Records, MD;  Location: [facility] INVASIVE CV LAB;  Service: Cardiovascular;  Laterality: N/A;  . LEFT HEART CATHETERIZATION WITH CORONARY ANGIOGRAM N/A 01/27/2013   Procedure: LEFT HEART CATHETERIZATION WITH CORONARY ANGIOGRAM;  Surgeon: Corky Crafts, MD;  Location: [facility] CATH LAB;  Service: Cardiovascular;  Laterality: N/A;  . LEFT HEART CATHETERIZATION WITH CORONARY ANGIOGRAM N/A 11/10/2014   Procedure: LEFT HEART CATHETERIZATION WITH CORONARY ANGIOGRAM;  Surgeon: Lennette Bihari, MD; left main okay, LAD 75% ISR, FFR 0.75, s/p Angiosculpt  scoring balloon and 2.7518 mm Resolute DES, CFX 70%, stent patent, RCA 50%  . TONSILLECTOMY AND ADENOIDECTOMY  ~ 23   Social History   Social History Narrative  . Not on file   Immunization History  Administered Date(s) Administered  . Influenza Split 07/07/2011, 06/24/2014  . Pneumococcal-Unspecified 07/07/2011     Objective: Vital Signs: BP 113/66 (BP Location: Right Arm, Patient Position: Sitting, Cuff Size: Normal)   Pulse 75   Resp 14   Ht 5\' 7"  (1.702 m)   Wt 221 lb (100.2 kg)   BMI 34.61 kg/m    Physical Exam Vitals signs and nursing note reviewed.  Constitutional:      Appearance: She is well-developed.  HENT:     Head: Normocephalic and atraumatic.  Eyes:     Conjunctiva/sclera: Conjunctivae normal.  Neck:     Musculoskeletal: Normal range of motion.  Cardiovascular:     Rate and Rhythm: Normal rate and regular rhythm.     Heart sounds: Normal heart sounds.  Pulmonary:     Effort: Pulmonary effort is normal.     Breath sounds: Normal breath sounds.  Abdominal:     General: Bowel sounds are normal.     Palpations:  Abdomen is soft.  Lymphadenopathy:     Cervical: No cervical adenopathy.  Skin:    General: Skin is warm and dry.     Capillary Refill: Capillary refill takes less than 2 seconds.  Neurological:     Mental Status: She is alert and oriented to person, place, and time.  Psychiatric:        Behavior: Behavior normal.      Musculoskeletal Exam: C-spine good range of motion.  She has limited range of motion of the lumbar spine.  Shoulder joints elbow joints wrist joint MCPs PIPs DIPs been good range of motion with no synovitis.  She has good range of motion of hip joints.  She has tenderness over bilateral trochanteric bursa.  She has painful range of motion of bilateral knee joints without any warmth swelling or effusion.  She is a small Baker's cyst in her left knee.  Ankles MTPs PIPs with good range of motion.  She has generalized pain and  discomfort from fibromyalgia.  CDAI Exam: CDAI Score: Not documented Patient Global Assessment: Not documented; Provider Global Assessment: Not documented Swollen: Not documented; Tender: Not documented Joint Exam   Not documented   There is currently no information documented on the homunculus. Go to the Rheumatology activity and complete the homunculus joint exam.  Investigation: No additional findings.  Imaging: Xr Knee 3 View Left  Result Date: 10/01/2018 Moderate to severe medial compartment narrowing and moderate patellofemoral narrowing was noted.  No chondrocalcinosis was noted. Impression: Moderate to severe osteoarthritis and moderate chondromalacia patella.  Xr Knee 3 View Right  Result Date: 10/01/2018 Moderate medial compartment narrowing without chondrocalcinosis was noted.  Moderate chondromalacia patella was noted. Impression: Moderate osteoarthritis and moderate chondromalacia patella.   Recent Labs: Lab Results  Component Value Date   WBC 4.1 04/08/2017   HGB 10.4 (L) 04/08/2017   PLT 254 04/08/2017   NA 143 04/08/2017   K 3.6 04/08/2017   CL 108 04/08/2017   CO2 27 04/08/2017   GLUCOSE 101 (H) 04/08/2017   BUN 12 04/08/2017   CREATININE 0.99 04/08/2017   BILITOT 0.2 (L) 01/08/2017   ALKPHOS 76 01/08/2017   AST 22 01/08/2017   ALT 17 01/08/2017   PROT 6.7 01/08/2017   ALBUMIN 3.8 01/08/2017   CALCIUM 9.2 04/08/2017   GFRAA >60 04/08/2017    Speciality Comments: No specialty comments available.  Procedures:  Large Joint Inj: bilateral knee on 10/01/2018 11:58 AM Indications: pain Details: 27 G 1.5 in needle, medial approach  Arthrogram: No  Medications (Right): 1.5 mL lidocaine 1 %; 40 mg triamcinolone acetonide 40 MG/ML Aspirate (Right): 0 mL Medications (Left): 1.5 mL lidocaine 1 %; 40 mg triamcinolone acetonide 40 MG/ML Aspirate (Left): 0 mL Outcome: tolerated well, no immediate complications Procedure, treatment alternatives, risks and  benefits explained, specific risks discussed. Consent was given by the patient. Immediately prior to procedure a time out was called to verify the correct patient, procedure, equipment, support staff and site/side marked as required. Patient was prepped and draped in the usual sterile fashion.     Allergies: Adhesive [tape]; Crestor [rosuvastatin calcium]; Statins; Codeine; and Elavil [amitriptyline]   Assessment / Plan:     Visit Diagnoses: Chronic pain of both knees -has been experiencing increased pain in her knee joints for the last few weeks.  She states she has been having difficulty walking.  No warmth swelling or effusion was noted.  Plan: XR KNEE 3 VIEW RIGHT, XR KNEE 3 VIEW LEFT.  After informed consent was obtained bilateral knee joints were injected with cortisone as described above.  She tolerated the procedure well.  I advised her to monitor blood pressure closely.  Primary osteoarthritis of both knees-x-rays were consistent with moderate osteoarthritis and moderate chondromalacia patella.  We discussed use of Visco supplement injections if she has an adequate response to cortisone injection.  She lives in [place] area now.  The commute is difficult for her.  We will try to get Visco supplement single injection possibly.  Weight loss diet and exercise was discussed.  I also offered physical therapy.  Patient states she has been going through cardiac rehab.  Baker's cyst of knee, left-she has chronic a small Baker's cyst in her left knee.  Trochanteric bursitis, right hip-she continues to have discomfort in the right trochanteric bursa.  IT band exercises were discussed.  DDD (degenerative disc disease), lumbar-she has chronic lower back pain.  Fibromyalgia syndrome - lyrica 75 mg p.o. twice daily is helpful.  Other fatigue-related to fibromyalgia.  Other medical problems are listed as follows:  History of heart attack  History of coronary artery disease  History of  hypercholesterolemia  History of hypertension  Other insomnia   Orders: Orders Placed This Encounter  Procedures  . Large Joint Inj: bilateral knee  . XR KNEE 3 VIEW RIGHT  . XR KNEE 3 VIEW LEFT   Meds ordered this encounter  Medications  . diclofenac sodium (VOLTAREN) 1 % GEL    Sig: 3 grams to 3 large joints up to 3 times daily    Dispense:  3 Tube    Refill:  3      Follow-Up Instructions: Return for Osteoarthritis,FMS.   Pollyann Savoy, MD  Note - This record has been created using Animal nutritionist.  Chart creation errors have been sought, but may not always  have been located. Such creation errors do not reflect on  the standard of medical care.

## 2018-10-01 ENCOUNTER — Ambulatory Visit (INDEPENDENT_AMBULATORY_CARE_PROVIDER_SITE_OTHER): Payer: Medicare HMO

## 2018-10-01 ENCOUNTER — Ambulatory Visit (INDEPENDENT_AMBULATORY_CARE_PROVIDER_SITE_OTHER): Payer: Self-pay

## 2018-10-01 ENCOUNTER — Ambulatory Visit (INDEPENDENT_AMBULATORY_CARE_PROVIDER_SITE_OTHER): Payer: Medicare HMO | Admitting: Rheumatology

## 2018-10-01 ENCOUNTER — Encounter: Payer: Self-pay | Admitting: Rheumatology

## 2018-10-01 ENCOUNTER — Telehealth: Payer: Self-pay | Admitting: *Deleted

## 2018-10-01 VITALS — BP 113/66 | HR 75 | Resp 14 | Ht 67.0 in | Wt 221.0 lb

## 2018-10-01 DIAGNOSIS — M25561 Pain in right knee: Secondary | ICD-10-CM

## 2018-10-01 DIAGNOSIS — M25562 Pain in left knee: Secondary | ICD-10-CM

## 2018-10-01 DIAGNOSIS — I252 Old myocardial infarction: Secondary | ICD-10-CM

## 2018-10-01 DIAGNOSIS — M797 Fibromyalgia: Secondary | ICD-10-CM

## 2018-10-01 DIAGNOSIS — Z8639 Personal history of other endocrine, nutritional and metabolic disease: Secondary | ICD-10-CM

## 2018-10-01 DIAGNOSIS — M51369 Other intervertebral disc degeneration, lumbar region without mention of lumbar back pain or lower extremity pain: Secondary | ICD-10-CM

## 2018-10-01 DIAGNOSIS — G8929 Other chronic pain: Secondary | ICD-10-CM

## 2018-10-01 DIAGNOSIS — M170 Bilateral primary osteoarthritis of knee: Secondary | ICD-10-CM

## 2018-10-01 DIAGNOSIS — M5136 Other intervertebral disc degeneration, lumbar region: Secondary | ICD-10-CM

## 2018-10-01 DIAGNOSIS — G4709 Other insomnia: Secondary | ICD-10-CM

## 2018-10-01 DIAGNOSIS — R5383 Other fatigue: Secondary | ICD-10-CM

## 2018-10-01 DIAGNOSIS — Z8679 Personal history of other diseases of the circulatory system: Secondary | ICD-10-CM

## 2018-10-01 DIAGNOSIS — M7122 Synovial cyst of popliteal space [Baker], left knee: Secondary | ICD-10-CM | POA: Diagnosis not present

## 2018-10-01 DIAGNOSIS — M7061 Trochanteric bursitis, right hip: Secondary | ICD-10-CM | POA: Diagnosis not present

## 2018-10-01 MED ORDER — LIDOCAINE HCL 1 % IJ SOLN
1.5000 mL | INTRAMUSCULAR | Status: AC | PRN
  Administered 2018-10-01: 1.5 mL

## 2018-10-01 MED ORDER — TRIAMCINOLONE ACETONIDE 40 MG/ML IJ SUSP
40.0000 mg | INTRAMUSCULAR | Status: AC | PRN
  Administered 2018-10-01: 40 mg via INTRA_ARTICULAR

## 2018-10-01 MED ORDER — DICLOFENAC SODIUM 1 % TD GEL: TRANSDERMAL | 3 refills | Status: DC

## 2018-10-01 NOTE — Telephone Encounter (Signed)
Per Dr. Corliss Skains apply for visco supplementation for bilateral knees for single injection as patient lives in [place]

## 2018-10-06 NOTE — Telephone Encounter (Signed)
Noted  

## 2018-10-15 ENCOUNTER — Other Ambulatory Visit: Payer: Self-pay | Admitting: Physician Assistant

## 2018-10-15 NOTE — Telephone Encounter (Signed)
Last Visit: 10/01/18 Next Visit: 12/15/18  Okay to refill Lyrica?

## 2018-10-19 ENCOUNTER — Telehealth (INDEPENDENT_AMBULATORY_CARE_PROVIDER_SITE_OTHER): Payer: Self-pay

## 2018-10-19 NOTE — Telephone Encounter (Signed)
Submitted VOB for SynviscOne, bilateral knee. 

## 2018-11-02 ENCOUNTER — Telehealth (INDEPENDENT_AMBULATORY_CARE_PROVIDER_SITE_OTHER): Payer: Self-pay

## 2018-11-02 NOTE — Telephone Encounter (Signed)
PA required for SynviscOne, bilateral knee. Faxed completed PA form to [facility] at [phone] and PA form faxed to [facility] at [phone].  Will submit for Monovisc being that [facility] has pre-ferred products which are Monovisc and Orthovisc.

## 2018-11-05 ENCOUNTER — Telehealth (INDEPENDENT_AMBULATORY_CARE_PROVIDER_SITE_OTHER): Payer: Self-pay

## 2018-11-05 NOTE — Telephone Encounter (Signed)
Initiated PA with Thayer Ohm at [place] for [phone], Monovisc, bilateral knee. PA Pending # [id number].  Per Thayer Ohm at [place], clinical notes need to be faxed to [phone].  Will fax clinical notes.

## 2018-11-05 NOTE — Telephone Encounter (Signed)
Faxed completed PA form and office notes for Monovisc, bilateral knee to [employer] at [phone].

## 2018-11-06 ENCOUNTER — Telehealth (INDEPENDENT_AMBULATORY_CARE_PROVIDER_SITE_OTHER): Payer: Self-pay | Admitting: Rheumatology

## 2018-11-06 NOTE — Telephone Encounter (Signed)
Fleet Contras with [employer] called left voicemail message for a return call from April. Fleet Contras advised she need another approval for another injection. The number to contact Fleet Contras is [phone]

## 2018-11-10 ENCOUNTER — Telehealth: Payer: Self-pay | Admitting: Rheumatology

## 2018-11-10 NOTE — Telephone Encounter (Signed)
Fleet Contras from [employer] called stating they received PA form for Monovisc.  Fleet Contras states they approved Synvisc on 11/02/18 with approval dates 11-02-18 to 11-02-19 and reference #[id number].  Fleet Contras states if patient doesn't want Synvisc  then Gelsyn or Durolane needs to be tried before applying for Monovisc.   Fleet Contras states if you have any questions, please call back on her direct line [phone]

## 2018-11-11 NOTE — Telephone Encounter (Signed)
[employer] approved Synvisc One, but Synvisc One is not preferred with [employer].  Follow up on which one to use.

## 2018-11-13 ENCOUNTER — Telehealth: Payer: Self-pay | Admitting: Rheumatology

## 2018-11-13 NOTE — Telephone Encounter (Signed)
I called and spoke with Fleet Contras.  I explained that Monovisc is preferred and covered with no PA for patient's [facility], primary for her.  Fleet Contras advises I need to call customer service for providers to inquire. Will call and ask for someone in billing.   Received  Voicemail message from [place] with [employer] of [place] she asked for a call back from April today because this is the last day she can hold on to the case. Fleet Contras asked for a call back concerning authorization for Monovisc injection. She said Synvisc was already authorized 3 days before this one. The number to contact Fleet Contras is [phone].

## 2018-11-13 NOTE — Telephone Encounter (Signed)
Received  Voicemail message from [place] with [employer] of [place] she asked for a call back from April today because this is the last day she can hold on to the case. Fleet Contras asked for a call back concerning authorization for Monovisc injection. She said Synvisc was already authorized 3 days before this one. The number to contact Fleet Contras is [phone]

## 2018-11-16 ENCOUNTER — Ambulatory Visit (INDEPENDENT_AMBULATORY_CARE_PROVIDER_SITE_OTHER): Payer: BC Managed Care – PPO | Admitting: Specialist

## 2018-11-16 NOTE — Telephone Encounter (Signed)
Submitted VOB for Monovisc, bilateral knee on 11/02/2018.

## 2018-11-16 NOTE — Telephone Encounter (Signed)
Let's do Monovisc for her. Thanks.

## 2018-11-18 ENCOUNTER — Telehealth (INDEPENDENT_AMBULATORY_CARE_PROVIDER_SITE_OTHER): Payer: Self-pay

## 2018-11-18 NOTE — Telephone Encounter (Signed)
Please schedule patient an appointment for gel injection with Dr. Corliss Skains or Ladona Ridgel.  Thank You.  Patient is approved for Monovisc, bilateral knee. Buy & Bill Patient will be responsible for 20% OOP. Co-pay of $139.00 No PA required

## 2018-11-19 NOTE — Telephone Encounter (Signed)
LMOM for patient to call and schedule Monovisc injection.

## 2018-12-15 ENCOUNTER — Ambulatory Visit: Payer: BC Managed Care – PPO | Admitting: Physician Assistant

## 2019-01-01 ENCOUNTER — Telehealth: Payer: Self-pay | Admitting: Physician Assistant

## 2019-01-01 HISTORY — PX: CARDIAC CATHETERIZATION: SHX172

## 2019-01-01 NOTE — Telephone Encounter (Signed)
Spoke with patient who confirmed all demographics. Patient has a smart phone and uses My Chart. Will have vitals ready for visit. °

## 2019-01-10 NOTE — Progress Notes (Signed)
Virtual Visit via Video Note   This visit type was conducted due to national recommendations for restrictions regarding the COVID-19 Pandemic (e.g. social distancing) in an effort to limit this patient's exposure and mitigate transmission in our community.  Due to her co-morbid illnesses, this patient is at least at moderate risk for complications without adequate follow up.  This format is felt to be most appropriate for this patient at this time.  All issues noted in this document were discussed and addressed.  A limited physical exam was performed with this format.  Please refer to the patient's chart for her consent to telehealth for [facility].   Date:  01/11/2019   ID:  [name], DOB [birth year], MRN [id number]  Patient Location: Home Provider Location: Home  PCP:  Andi DevonShelton, Kimberly, MD  Cardiologist:  Lesleigh NoeHenry W Smith III, MD Electrophysiologist:  None   Evaluation Performed:  Follow-Up Visit  Chief Complaint:  6 months follow up  History of Present Illness:    Catherine HurterCarolyn [name] Moan is a 83 y.o. female with a hx of  h/o CAD (s/p DES to LAD 2006, DES to Cx 2014, angiosculpt scoring balloon/DES to LAD 2016, DES to RCA 12/2015), chronic diastolic CHF, HTN, atrial fib/flutter, hyperlipidemia, pre-diabetes, chronic LEE (L>R), fibromyalgia, anxiety, remote h/o stomach ulcers seen for follow up.   Last seen by Dr. Katrinka BlazingSmith 05/2018. Had some occasional dyspnea. Continued aspirin and plavix with plan to discontinued Plavix if no recurrence of angina.  Patient moved to [place], [place] with his [place]. Has establish cardiac care with Dr. Blondell RevealWinslow at [place].  Due to worsening dyspnea patient has cardiac catheterization done last week status post 2 new stenting.  No records available for review today due to virtual office visit.  Her dyspnea has been resolved.  The patient does not have symptoms concerning for COVID-19 infection (fever, chills, cough, or new shortness of breath).    Past  Medical History:  Diagnosis Date  . Anxiety   . Arthritis    Multiple joints  . Chronic diastolic CHF (congestive heart failure) (HCC) 01/16/2016  . Coronary artery disease    a. s/p DES to LAD 2006. b. DES to Cx 2014. c. angiosculpt scoring balloon/DES to LAD 2016. d. DES to RCA 12/2015.  . Fibromyalgia   . GERD (gastroesophageal reflux disease)   . High cholesterol   . History of stomach ulcers    Remotely  . Hypertension   . Lower extremity edema    a. h/o chronic edema L>R.  [name]. Neuropathy   . Osteoporosis   . PAF (paroxysmal atrial fibrillation) (HCC)    a. 2012 - H/P says atrial flutter, D/c summary says atrial fib - was tx with IV amiodarone, only had a few hours of arrhythmias, no clinical recurrence as of 2017.  [name]. Ventricular hypertrophy   . Vertigo 08/27/2006   Past Surgical History:  Procedure Laterality Date  . ABDOMINAL HYSTERECTOMY  1970's  . BREAST BIOPSY Right 1970's  . BREAST LUMPECTOMY Right 1970's   benign   . CARDIAC CATHETERIZATION  10/2006   [name]/notes 08/03/2007 (01/27/2013)  . CARDIAC CATHETERIZATION N/A 01/01/2016   Procedure: Left Heart Cath and Coronary Angiography;  Surgeon: Kathleene Hazelhristopher D McAlhany, MD;  [facility];  Service: Cardiovascular;  Laterality: N/A;  . CARDIAC CATHETERIZATION N/A 01/01/2016   Procedure: Coronary Stent Intervention;  Surgeon: Kathleene Hazelhristopher D McAlhany, MD;  Location: [facility] INVAS[facility] CV LAB;  Service: Cardiovascular;  Laterality: N/A;  . CORONARY ANGIOPLASTY  WITH STENT PLACEMENT  06/2005; 01/27/2013    2.515 mm Cypher stent to LAD in 2006; 2.75 x 12 Promus DES to the circumflex 2014    . DIAGNOSTIC LAPAROSCOPY  2000   lap abdominal lysis of adhesions  . ESOPHAGOGASTRODUODENOSCOPY N/A 11/10/2014   Procedure: ESOPHAGOGASTRODUODENOSCOPY (EGD);  Surgeon: Jeani Hawking, MD;  Location[facility] END[facility]COPY;  Service: Endoscopy;  Laterality: N/A;  . HERNIA REPAIR    . LAPAROSCOPIC SALPINGO OOPHERECTOMY  2000  . LAPAROTOMY     "day after  oophorectomy; had bowel obstruction" (01/27/2013)  . LEFT HEART CATH AND CORONARY ANGIOGRAPHY N/A 04/07/2017   Procedure: LEFT HEART CATH AND CORONARY ANGIOGRAPHY;  Surgeon: Lyn Records, MD;  [facility]: [facility] INV[facility] CV LAB;  Service: Cardiovascular;  Laterality: N/A;  . LEFT HEART CATHETERIZATION WITH CORONARY ANGIOGRAM N/A 01/27/2013   Procedure: LEFT HEART CATHETERIZATION WITH CORONARY ANGIOGRAM;  Surgeon: Corky Crafts, MD;  Location[facility] CATH LAB;  Service: Cardiovascular;  Laterality: N/A;  . LEFT HEART CATHETERIZATION WITH CORONARY ANGIOGRAM N/A 11/10/2014   Procedure: LEFT HEART CATHETERIZATION WITH CORONARY ANGIOGRAM;  Surgeon: Lennette Bihari, MD; left main okay, LAD 75% ISR, FFR 0.75, s/p Angiosculpt scoring balloon and 2.7518 mm Resolute DES, CFX 70%, stent patent, RCA 50%  . TONSILLECTOMY AND ADENOIDECTOMY  ~ 1952     Current Meds  Medication Sig  . acetaminophen (TYLENOL) 500 MG tablet Take 500 mg by mouth daily as needed (pain).  [name] albuterol (PROVENTIL HFA;VENTOLIN HFA) 108 (90 Base) MCG/ACT inhaler Inhale 2 puffs into the lungs as needed.   . ALPRAZolam (XANAX) 0.25 MG tablet Take 0.25 mg by mouth 2 (two) times daily as needed for anxiety. anxiety  . aspirin EC 81 MG tablet Take 81 mg by mouth daily.    . Cholecalciferol (VITAMIN D PO) Take 1 tablet by mouth daily.   . clopidogrel (PLAVIX) 75 MG tablet take 1 tablet by mouth once daily  . Cyanocobalamin (B-12 PO) Take 1 tablet by mouth 3 (three) times a week.  . Diphenhydramine-APAP, sleep, (TYLENOL PM EXTRA STRENGTH PO) Take 0.5 tablets by mouth at bedtime as needed. For sleep  . fluticasone (FLONASE) 50 MCG/ACT nasal spray Place 2 sprays into both nostrils daily as needed for allergies or rhinitis.  [name] loratadine (CLARITIN) 10 MG tablet Take 10 mg by mouth daily. Reported on 12/29/2015  . metoprolol tartrate (LOPRESSOR) 25 MG tablet Take 1 tablet (25 mg total) by mouth 2 (two) times daily.  . nitroGLYCERIN (NITROSTAT) 0.4 MG SL  tablet Place 1 tablet (0.4 mg total) under the tongue every 5 (five) minutes as needed for chest pain. For 3 doses  . pantoprazole (PROTONIX) 40 MG tablet take 1 tablet by mouth once daily AT 6 AM  . potassium chloride SA (K-DUR,KLOR-CON) 20 MEQ tablet Take 20 mEq by mouth 4 (four) times a week. (Tuesdays, Thursdays, Saturdays, and Sundays)  . pregabalin (LYRICA) 75 MG capsule TAKE ONE CAPSULE BY MOUTH TWICE DAILY  . Probiotic Product (ALIGN PO) Take 1 tablet by mouth daily.  [name] torsemide (DEMADEX) 20 MG tablet Take 20-40 mg by mouth daily.   [name] trolamine salicylate (ASPERCREME) 10 % cream Apply 1 application topically as needed for muscle pain.  . valsartan-hydrochlorothiazide (DIOVAN-HCT) 160-12.5 MG tablet valsartan 160 mg-hydrochlorothiazide 12.5 mg tablet  TAKE 1 TABLET BY MOUTH ONCE DAILY     Allergies:   Adhesive [tape]; Crestor [rosuvastatin calcium]; Statins; Codeine; and Elavil [amitriptyline]   Social History   Tobacco Use  . Smoking  status: Former Smoker    Packs/day: 0.12    Years: 36.00    Pack years: 4.32    Types: Cigarettes    Last attempt to quit: 12/02/1991    Years since quitting: 27.1  . Smokeless tobacco: Never Used  Substance Use Topics  . Alcohol use: No  . Drug use: Never     Family Hx: The patient's family history includes Cancer in her sister and son; Heart attack in her father; Stroke in her mother.  ROS:   Please see the history of present illness.   All other systems reviewed and are negative.   Prior CV studies:   The following studies were reviewed today:  LEFT HEART CATH AND CORONARY ANGIOGRAPHY  04/07/2017  Conclusion    Multivessel coronary disease with previous stents all patent: ostial RCA stent May 2017, mid LAD stent treated with restenting in 2016, and mid circumflex stent 2014.  Ostial circumflex stenosis in the 75-80% range, appearing slightly worse than on last angiogram in May 2017. Disease has been present since at least 2014.   Ostial LAD, less than equal to 40%, unchanged from 2017.  Widely patent left main.  Normal left ventricular function with EF 60% and normal filling pressures.  RECOMMENDATIONS:   Unclear if presenting symptoms are related to her current coronary anatomy. PCI on the ostial circumflex would threaten the LAD. I do believe there has been some progression in the ostial circumflex compared to 15 months ago. My approach for the time being would be medical therapy since this disease is not new.  Multiple passes to achieve arterial access in the right femoral. Relatively high stick. Monitor for bleeding complications.    Labs/Other Tests and Data Reviewed:    EKG:  No ECG reviewed.  Recent Labs: No results found for requested labs within last 8760 hours.   Recent Lipid Panel Lab Results  Component Value Date/Time   CHOL 271 (H) 12/30/2015 12:00 AM   TRIG 90 12/30/2015 12:00 AM   HDL 49 12/30/2015 12:00 AM   CHOLHDL 5.5 12/30/2015 12:00 AM   LDLCALC 204 (H) 12/30/2015 12:00 AM    Wt Readings from Last 3 Encounters:  01/11/19 214 lb (97.1 kg)  10/01/18 221 lb (100.2 kg)  06/12/18 218 lb (98.9 kg)     Objective:    Vital Signs:  BP 128/76   Pulse 61   Ht  (1.702 m)   Wt 214 lb (97.1 kg)   BMI 33.52 kg/m    VITAL SIGNS:  reviewed GEN:  no acute distress EYES:  sclerae anicteric, EOMI - Extraocular Movements Intact RESPIRATORY:  normal respiratory effort, symmetric expansion CARDIOVASCULAR:  no peripheral edema SKIN:  no rash, lesions or ulcers. MUSCULOSKELETAL:  no obvious deformities. NEURO:  alert and oriented x 3, no obvious focal deficit PSYCH:  normal affect  ASSESSMENT & PLAN:    1. CAD -Status post stenting x2 last week at [place], [place].  Continue current medical therapy.  2.  Hypertension -Blood pressure stable on current medication.  3. HLD - Continue statin   COVID-19 Education: The signs and symptoms of COVID-19 were discussed with  the patient and how to seek care for testing (follow up with PCP or arrange E-visit).  The importance of social distancing was discussed today.  Time:   Today, I have spent 7 minutes with the patient with telehealth technology discussing the above problems.     Medication Adjustments/Labs and Tests Ordered: Current medicines are reviewed  at length with the patient today.  Concerns regarding medicines are outlined above.   Tests Ordered: No orders of the defined types were placed in this encounter.   Medication Changes: No orders of the defined types were placed in this encounter.   Disposition:  Follow up prn  Lorelei Pont, PA  01/11/2019 9:08 AM    [facility]

## 2019-01-11 ENCOUNTER — Other Ambulatory Visit: Payer: Self-pay

## 2019-01-11 ENCOUNTER — Telehealth (INDEPENDENT_AMBULATORY_CARE_PROVIDER_SITE_OTHER): Payer: Medicare HMO | Admitting: Physician Assistant

## 2019-01-11 ENCOUNTER — Encounter: Payer: Self-pay | Admitting: Physician Assistant

## 2019-01-11 VITALS — BP 128/76 | HR 61 | Ht 67.0 in | Wt 214.0 lb

## 2019-01-11 DIAGNOSIS — E785 Hyperlipidemia, unspecified: Secondary | ICD-10-CM

## 2019-01-11 DIAGNOSIS — I25119 Atherosclerotic heart disease of native coronary artery with unspecified angina pectoris: Secondary | ICD-10-CM

## 2019-01-11 DIAGNOSIS — I480 Paroxysmal atrial fibrillation: Secondary | ICD-10-CM

## 2019-01-11 DIAGNOSIS — I10 Essential (primary) hypertension: Secondary | ICD-10-CM

## 2019-01-11 DIAGNOSIS — I5032 Chronic diastolic (congestive) heart failure: Secondary | ICD-10-CM

## 2019-01-11 NOTE — Patient Instructions (Signed)
Medication Instructions:  Your physician recommends that you continue on your current medications as directed. Please refer to the Current Medication list given to you today.  If you need a refill on your cardiac medications before your next appointment, please call your pharmacy.   Lab work: NONE If you have labs (blood work) drawn today and your tests are completely normal, you will receive your results only by: [name] MyChart Message (if you have MyChart) OR . A paper copy in the mail If you have any lab test that is abnormal or we need to change your treatment, we will call you to review the results.  Testing/Procedures: NONE  Follow-Up: At [facility], you and your health needs are our priority.  As part of our continuing mission to provide you with exceptional heart care, we have created designated Provider Care Teams.  These Care Teams include your primary Cardiologist (physician) and Advanced Practice Providers (APPs -  Physician Assistants and Nurse Practitioners) who all work together to provide you with the care you need, when you need it.  You will need a follow up appointment with Lesleigh Noe, MD as needed

## 2019-01-27 ENCOUNTER — Telehealth: Payer: Self-pay

## 2019-02-10 NOTE — Progress Notes (Signed)
Office Visit Note  Patient: [name]             Date of Birth: 05-Feb-1936           MRN: [id number]             PCP: Andi DevonShelton, Kimberly, MD Referring: Andi DevonShelton, Kimberly, MD Visit Date: 02/24/2019 Occupation: @GUAROCC @  Subjective:  Pain in both knees and lower back pain.   History of Present Illness: [name] is a 83 y.o. female with history of osteoarthritis and fibromyalgia syndrome.  [place] and has been driving back and forth.  About 3 months ago she started having increased pain and discomfort in her right knee joint.  At that time she was seen by an orthopedic surgeon who diagnosed her with an MCL strain.  She was also given Visco supplement injections to bilateral knee joints.  She states she has been having ongoing pain and discomfort in her bilateral knee joint since she had Visco supplement injection.  She is having difficulty walking.  As she is walking awkward her lower back and started hurting as well.  She is interested in getting cortisone injection to her knee joints.  Activities of Daily Living:  Patient reports morning stiffness for 1 hour.   Patient Reports nocturnal pain.  Difficulty dressing/grooming: Reports Difficulty climbing stairs: Reports Difficulty getting out of chair: Reports Difficulty using hands for taps, buttons, cutlery, and/or writing: Denies  Review of Systems  Constitutional: Positive for fatigue. Negative for night sweats, weight gain and weight loss.  HENT: Negative for mouth sores, trouble swallowing, trouble swallowing, mouth dryness and nose dryness.   Eyes: Negative for pain, redness, visual disturbance and dryness.  Respiratory: Negative for cough, shortness of breath and difficulty breathing.   Cardiovascular: Negative for chest pain, palpitations, hypertension, irregular heartbeat and swelling in legs/feet.  Gastrointestinal: Negative for blood in stool, constipation and diarrhea.   Endocrine: Negative for increased urination.  Genitourinary: Negative for vaginal dryness.  Musculoskeletal: Positive for arthralgias, joint pain, myalgias, morning stiffness and myalgias. Negative for joint swelling, muscle weakness and muscle tenderness.  Skin: Negative for color change, rash, hair loss, skin tightness, ulcers and sensitivity to sunlight.  Allergic/Immunologic: Negative for susceptible to infections.  Neurological: Negative for dizziness, memory loss, night sweats and weakness.  Hematological: Negative for swollen glands.  Psychiatric/Behavioral: Positive for sleep disturbance. Negative for depressed mood. The patient is not nervous/anxious.     PMFS History:  Patient Active Problem List   Diagnosis Date Noted  . DDD (degenerative disc disease), lumbar 05/28/2017  . CKD (chronic kidney disease) stage 3, GFR 30-59 ml/min (HCC) 04/06/2017  . Hyperglycemia 04/06/2017  . Primary osteoarthritis of both knees 11/27/2016  . Osteoarthritis of pelvis 11/27/2016  . Fibromyalgia syndrome 11/27/2016  . Fatigue 11/27/2016  . Snoring 06/28/2016  . Chronic diastolic CHF (congestive heart failure) (HCC) 01/16/2016  . Schatzki's ring 11/11/2014  . Hiatal hernia 11/11/2014  . Essential hypertension   . Dyspnea 05/05/2014  . CAP (community acquired pneumonia) 05/02/2014  . Dysuria 01/26/2014  . Atrophic vaginitis 01/26/2014  . Hyperlipidemia 07/23/2013  . Old MI (myocardial infarction)     Class: Chronic  . Nausea and vomiting 11/13/2011  . Vertigo, benign positional 07/07/2011  . Coronary artery disease involving native coronary artery of native heart with angina pectoris (HCC) 07/07/2011  . A-fib (HCC) 07/07/2011    Past Medical History:  Diagnosis Date  . Anxiety   .  Arthritis    Multiple joints  . Chronic diastolic CHF (congestive heart failure) (HCC) 01/16/2016  . Coronary artery disease    a. s/p DES to LAD 2006. b. DES to Cx 2014. c. angiosculpt scoring balloon/DES  to LAD 2016. d. DES to RCA 12/2015.  . Fibromyalgia   . GERD (gastroesophageal reflux disease)   . High cholesterol   . History of stomach ulcers    Remotely  . Hypertension   . Lower extremity edema    a. h/o chronic edema L>R.  [name]. Neuropathy   . Osteoporosis   . PAF (paroxysmal atrial fibrillation) (HCC)    a. 2012 - H/P says atrial flutter, D/c summary says atrial fib - was tx with IV amiodarone, only had a few hours of arrhythmias, no clinical recurrence as of 2017.  [name]. Ventricular hypertrophy   . Vertigo 08/27/2006    Family History  Problem Relation Age of Onset  . Stroke Mother   . Heart attack Father   . Cancer Sister        breast  . Cancer Son        prostate   Past Surgical History:  Procedure Laterality Date  . ABDOMINAL HYSTERECTOMY  1970's  . BREAST BIOPSY Right 1970's  . BREAST LUMPECTOMY Right 1970's   benign   . CARDIAC CATHETERIZATION  10/2006   [name]/notes 08/03/2007 (01/27/2013)  . CARDIAC CATHETERIZATION N/A 01/01/2016   Procedure: Left Heart Cath and Coronary Angiography;  Surgeon: Kathleene Hazelhristopher D McAlhany, MD;  [facility];  Service: Cardiovascular;  Laterality: N/A;  . CARDIAC CATHETERIZATION N/A 01/01/2016   Procedure: Coronary Stent Intervention;  Surgeon: Kathleene Hazelhristopher D McAlhany, MD;  Location: [facility] INVASIVE CV LAB;  Service: Cardiovascular;  Laterality: N/A;  . CARDIAC CATHETERIZATION  01/2019  . CORONARY ANGIOPLASTY WITH STENT PLACEMENT  06/2005; 01/27/2013    2.515 mm Cypher stent to LAD in 2006; 2.75 x 12 Promus DES to the circumflex 2014    . DIAGNOSTIC LAPAROSCOPY  2000   lap abdominal lysis of adhesions  . ESOPHAGOGASTRODUODENOSCOPY N/A 11/10/2014   Procedure: ESOPHAGOGASTRODUODENOSCOPY (EGD);  Surgeon: Jeani HawkingPatrick Hung, MD;  [facility];  Service: Endoscopy;  Laterality: N/A;  . HERNIA REPAIR    . LAPAROSCOPIC SALPINGO OOPHERECTOMY  2000  . LAPAROTOMY     "day after oophorectomy; had bowel obstruction" (01/27/2013)  . LEFT HEART  CATH AND CORONARY ANGIOGRAPHY N/A 04/07/2017   Procedure: LEFT HEART CATH AND CORONARY ANGIOGRAPHY;  Surgeon: Lyn RecordsSmith, Henry W, MD;  [facility] CV LAB;  Service: Cardiovascular;  Laterality: N/A;  . LEFT HEART CATHETERIZATION WITH CORONARY ANGIOGRAM N/A 01/27/2013   Procedure: LEFT HEART CATHETERIZATION WITH CORONARY ANGIOGRAM;  Surgeon: Corky CraftsJayadeep S Varanasi, MD;  [facility];  Service: Cardiovascular;  Laterality: N/A;  . LEFT HEART CATHETERIZATION WITH CORONARY ANGIOGRAM N/A 11/10/2014   Procedure: LEFT HEART CATHETERIZATION WITH CORONARY ANGIOGRAM;  Surgeon: Lennette Biharihomas A Kelly, MD; left main okay, LAD 75% ISR, FFR 0.75, s/p Angiosculpt scoring balloon and 2.7518 mm Resolute DES, CFX 70%, stent patent, RCA 50%  . TONSILLECTOMY AND ADENOIDECTOMY  ~ 181952   Social History   Social History Narrative  . Not on file   Immunization History  Administered Date(s) Administered  . Influenza Split 07/07/2011, 06/24/2014  . Pneumococcal-Unspecified 07/07/2011     Objective: Vital Signs: BP 140/81 (BP Location: Right Arm, Patient Position: Sitting, Cuff Size: Normal)   Pulse 70   Resp 15   Ht 5\' 7"  (1.702 m)  Wt 219 lb 9.6 oz (99.6 kg)   BMI 34.39 kg/m    Physical Exam Vitals signs and nursing note reviewed.  Constitutional:      Appearance: She is well-developed.  HENT:     Head: Normocephalic and atraumatic.  Eyes:     Conjunctiva/sclera: Conjunctivae normal.  Neck:     Musculoskeletal: Normal range of motion.  Cardiovascular:     Rate and Rhythm: Normal rate and regular rhythm.     Heart sounds: Normal heart sounds.  Pulmonary:     Effort: Pulmonary effort is normal.     Breath sounds: Normal breath sounds.  Abdominal:     General: Bowel sounds are normal.     Palpations: Abdomen is soft.  Lymphadenopathy:     Cervical: No cervical adenopathy.  Skin:    General: Skin is warm and dry.     Capillary Refill: Capillary refill takes less than 2 seconds.   Neurological:     Mental Status: She is alert and oriented to person, place, and time.  Psychiatric:        Behavior: Behavior normal.      Musculoskeletal Exam: C-spine with limited range of motion.  She has painful limited range of motion of her lumbar spine.  Shoulder joints elbow joints wrist joints with good range of motion.  She has normal PIP and DIP thickening.  She had painful range of motion of bilateral knee joints with some Baker's cyst.  Some warmth was noted in the left knee joint.  CDAI Exam: CDAI Score: - Patient Global: -; Provider Global: - Swollen: -; Tender: - Joint Exam   No joint exam has been documented for this visit   There is currently no information documented on the homunculus. Go to the Rheumatology activity and complete the homunculus joint exam.  Investigation: No additional findings.  Imaging: No results found.  Recent Labs: Lab Results  Component Value Date   WBC 4.1 04/08/2017   HGB 10.4 (L) 04/08/2017   PLT 254 04/08/2017   NA 143 04/08/2017   K 3.6 04/08/2017   CL 108 04/08/2017   CO2 27 04/08/2017   GLUCOSE 101 (H) 04/08/2017   BUN 12 04/08/2017   CREATININE 0.99 04/08/2017   BILITOT 0.2 (L) 01/08/2017   ALKPHOS 76 01/08/2017   AST 22 01/08/2017   ALT 17 01/08/2017   PROT 6.7 01/08/2017   ALBUMIN 3.8 01/08/2017   CALCIUM 9.2 04/08/2017   GFRAA >60 04/08/2017    Speciality Comments: No specialty comments available.  Procedures:  Large Joint Inj: bilateral knee on 02/24/2019 11:32 AM Indications: pain Details: 27 G 1.5 in needle, medial approach  Arthrogram: No  Medications (Right): 1.5 mL lidocaine 1 %; 40 mg triamcinolone acetonide 40 MG/ML Medications (Left): 1.5 mL lidocaine 1 %; 40 mg triamcinolone acetonide 40 MG/ML Outcome: tolerated well, no immediate complications Procedure, treatment alternatives, risks and benefits explained, specific risks discussed. Consent was given by the patient. Immediately prior to  procedure a time out was called to verify the correct patient, procedure, equipment, support staff and site/side marked as required. Patient was prepped and draped in the usual sterile fashion.     Allergies: Adhesive [tape], Crestor [rosuvastatin calcium], Statins, Codeine, and Elavil [amitriptyline]   Assessment / Plan:     Visit Diagnoses: Primary osteoarthritis of both knees -patient reports that she had recent Visco supplement injections by an orthopedic surgeon.  She is been having increased pain and discomfort in her bilateral knee joints.  She had warmth on palpation of her left knee joint.  She also had Baker's cyst present.  After informed consent was obtained and different treatment options were discussed bilateral knee joints were injected with cortisone as described above.  She tolerated the procedure well.  A handout on knee joint exercises was given.  Baker's cyst of knee, left -we will see response to the cortisone injection.  DDD (degenerative disc disease), lumbar - Plan: I have given her back exercises.  I believe the lower back pain is related to the knee joint arthritis.  Fibromyalgia syndrome -she continues to have generalized pain and discomfort.  Need for regular exercise was discussed.  Other fatigue -related to insomnia.  Other insomnia - Plan: Good sleep hygiene was discussed.  History of heart attack -  History of coronary artery disease -she is followed by cardiologist.  History of hypercholesterolemia -weight loss diet and exercise was discussed.  History of hypertension -her blood pressure is well controlled.  Orders: No orders of the defined types were placed in this encounter.    Follow-Up Instructions: Return in about 6 months (around 08/26/2019) for Osteoarthritis.   Pollyann SavoyShaili Jaasia Viglione, MD  Note - This record has been created using Animal nutritionistDragon software.  Chart creation errors have been sought, but may not always  have been located. Such creation  errors do not reflect on  the standard of medical care.

## 2019-02-24 ENCOUNTER — Other Ambulatory Visit: Payer: Self-pay

## 2019-02-24 ENCOUNTER — Encounter: Payer: Self-pay | Admitting: Physician Assistant

## 2019-02-24 ENCOUNTER — Ambulatory Visit (INDEPENDENT_AMBULATORY_CARE_PROVIDER_SITE_OTHER): Payer: Medicare HMO | Admitting: Rheumatology

## 2019-02-24 VITALS — BP 140/81 | HR 70 | Resp 15 | Ht 67.0 in | Wt 219.6 lb

## 2019-02-24 DIAGNOSIS — R5383 Other fatigue: Secondary | ICD-10-CM | POA: Diagnosis not present

## 2019-02-24 DIAGNOSIS — I252 Old myocardial infarction: Secondary | ICD-10-CM

## 2019-02-24 DIAGNOSIS — M170 Bilateral primary osteoarthritis of knee: Secondary | ICD-10-CM

## 2019-02-24 DIAGNOSIS — M7122 Synovial cyst of popliteal space [Baker], left knee: Secondary | ICD-10-CM

## 2019-02-24 DIAGNOSIS — G4709 Other insomnia: Secondary | ICD-10-CM

## 2019-02-24 DIAGNOSIS — M51369 Other intervertebral disc degeneration, lumbar region without mention of lumbar back pain or lower extremity pain: Secondary | ICD-10-CM

## 2019-02-24 DIAGNOSIS — M5136 Other intervertebral disc degeneration, lumbar region: Secondary | ICD-10-CM

## 2019-02-24 DIAGNOSIS — M797 Fibromyalgia: Secondary | ICD-10-CM | POA: Diagnosis not present

## 2019-02-24 DIAGNOSIS — Z8679 Personal history of other diseases of the circulatory system: Secondary | ICD-10-CM

## 2019-02-24 DIAGNOSIS — Z8639 Personal history of other endocrine, nutritional and metabolic disease: Secondary | ICD-10-CM

## 2019-02-24 MED ORDER — LIDOCAINE HCL 1 % IJ SOLN
1.5000 mL | INTRAMUSCULAR | Status: AC | PRN
  Administered 2019-02-24: 1.5 mL

## 2019-02-24 MED ORDER — TRIAMCINOLONE ACETONIDE 40 MG/ML IJ SUSP
40.0000 mg | INTRAMUSCULAR | Status: AC | PRN
  Administered 2019-02-24: 40 mg via INTRA_ARTICULAR

## 2019-02-24 NOTE — Patient Instructions (Signed)
Knee Exercises Ask your health care provider which exercises are safe for you. Do exercises exactly as told by your health care provider and adjust them as directed. It is normal to feel mild stretching, pulling, tightness, or discomfort as you do these exercises, but you should stop right away if you feel sudden pain or your pain gets worse.Do not begin these exercises until told by your health care provider. STRETCHING AND RANGE OF MOTION EXERCISES These exercises warm up your muscles and joints and improve the movement and flexibility of your knee. These exercises also help to relieve pain, numbness, and tingling. Exercise A: Knee Extension, Prone 1. Lie on your abdomen on a bed. 2. Place your left / right knee just beyond the edge of the surface so your knee is not on the bed. You can put a towel under your left / right thigh just above your knee for comfort. 3. Relax your leg muscles and allow gravity to straighten your knee. You should feel a stretch behind your left / right knee. 4. Hold this position for __________ seconds. 5. Scoot up so your knee is supported between repetitions. Repeat __________ times. Complete this stretch __________ times a day. Exercise B: Knee Flexion, Active  1. Lie on your back with both knees straight. If this causes back discomfort, bend your left / right knee so your foot is flat on the floor. 2. Slowly slide your left / right heel back toward your buttocks until you feel a gentle stretch in the front of your knee or thigh. 3. Hold this position for __________ seconds. 4. Slowly slide your left / right heel back to the starting position. Repeat __________ times. Complete this exercise __________ times a day. Exercise C: Quadriceps, Prone  1. Lie on your abdomen on a firm surface, such as a bed or padded floor. 2. Bend your left / right knee and hold your ankle. If you cannot reach your ankle or pant leg, loop a belt around your foot and grab the belt  instead. 3. Gently pull your heel toward your buttocks. Your knee should not slide out to the side. You should feel a stretch in the front of your thigh and knee. 4. Hold this position for __________ seconds. Repeat __________ times. Complete this stretch __________ times a day. Exercise D: Hamstring, Supine 1. Lie on your back. 2. Loop a belt or towel over the ball of your left / right foot. The ball of your foot is on the walking surface, right under your toes. 3. Straighten your left / right knee and slowly pull on the belt to raise your leg until you feel a gentle stretch behind your knee. ? Do not let your left / right knee bend while you do this. ? Keep your other leg flat on the floor. 4. Hold this position for __________ seconds. Repeat __________ times. Complete this stretch __________ times a day. STRENGTHENING EXERCISES These exercises build strength and endurance in your knee. Endurance is the ability to use your muscles for a long time, even after they get tired. Exercise E: Quadriceps, Isometric  1. Lie on your back with your left / right leg extended and your other knee bent. Put a rolled towel or small pillow under your knee if told by your health care provider. 2. Slowly tense the muscles in the front of your left / right thigh. You should see your kneecap slide up toward your hip or see increased dimpling just above the knee. This   motion will push the back of the knee toward the floor. 3. For __________ seconds, keep the muscle as tight as you can without increasing your pain. 4. Relax the muscles slowly and completely. Repeat __________ times. Complete this exercise __________ times a day. Exercise F: Straight Leg Raises - Quadriceps 1. Lie on your back with your left / right leg extended and your other knee bent. 2. Tense the muscles in the front of your left / right thigh. You should see your kneecap slide up or see increased dimpling just above the knee. Your thigh may  even shake a bit. 3. Keep these muscles tight as you raise your leg 4-6 inches (10-15 cm) off the floor. Do not let your knee bend. 4. Hold this position for __________ seconds. 5. Keep these muscles tense as you lower your leg. 6. Relax your muscles slowly and completely after each repetition. Repeat __________ times. Complete this exercise __________ times a day. Exercise G: Hamstring, Isometric 1. Lie on your back on a firm surface. 2. Bend your left / right knee approximately __________ degrees. 3. Dig your left / right heel into the surface as if you are trying to pull it toward your buttocks. Tighten the muscles in the back of your thighs to dig as hard as you can without increasing any pain. 4. Hold this position for __________ seconds. 5. Release the tension gradually and allow your muscles to relax completely for __________ seconds after each repetition. Repeat __________ times. Complete this exercise __________ times a day. Exercise H: Hamstring Curls  If told by your health care provider, do this exercise while wearing ankle weights. Begin with __________ weights. Then increase the weight by 1 lb (0.5 kg) increments. Do not wear ankle weights that are more than __________. 1. Lie on your abdomen with your legs straight. 2. Bend your left / right knee as far as you can without feeling pain. Keep your hips flat against the floor. 3. Hold this position for __________ seconds. 4. Slowly lower your leg to the starting position.  Repeat __________ times. Complete this exercise __________ times a day. Exercise I: Squats (Quadriceps) 1. Stand in front of a table, with your feet and knees pointing straight ahead. You may rest your hands on the table for balance but not for support. 2. Slowly bend your knees and lower your hips like you are going to sit in a chair. ? Keep your weight over your heels, not over your toes. ? Keep your lower legs upright so they are parallel with the table  legs. ? Do not let your hips go lower than your knees. ? Do not bend lower than told by your health care provider. ? If your knee pain increases, do not bend as low. 3. Hold the squat position for __________ seconds. 4. Slowly push with your legs to return to standing. Do not use your hands to pull yourself to standing. Repeat __________ times. Complete this exercise __________ times a day. Exercise J: Wall Slides (Quadriceps)  1. Lean your back against a smooth wall or door while you walk your feet out 18-24 inches (46-61 cm) from it. 2. Place your feet hip-width apart. 3. Slowly slide down the wall or door until your knees bend __________ degrees. Keep your knees over your heels, not over your toes. Keep your knees in line with your hips. 4. Hold for __________ seconds. Repeat __________ times. Complete this exercise __________ times a day. Exercise K: Straight Leg Raises -   Hip Abductors 1. Lie on your side with your left / right leg in the top position. Lie so your head, shoulder, knee, and hip line up. You may bend your bottom knee to help you keep your balance. 2. Roll your hips slightly forward so your hips are stacked directly over each other and your left / right knee is facing forward. 3. Leading with your heel, lift your top leg 4-6 inches (10-15 cm). You should feel the muscles in your outer hip lifting. ? Do not let your foot drift forward. ? Do not let your knee roll toward the ceiling. 4. Hold this position for __________ seconds. 5. Slowly return your leg to the starting position. 6. Let your muscles relax completely after each repetition. Repeat __________ times. Complete this exercise __________ times a day. Exercise L: Straight Leg Raises - Hip Extensors 1. Lie on your abdomen on a firm surface. You can put a pillow under your hips if that is more comfortable. 2. Tense the muscles in your buttocks and lift your left / right leg about 4-6 inches (10-15 cm). Keep your knee  straight as you lift your leg. 3. Hold this position for __________ seconds. 4. Slowly lower your leg to the starting position. 5. Let your leg relax completely after each repetition. Repeat __________ times. Complete this exercise __________ times a day. This information is not intended to replace advice given to you by your health care provider. Make sure you discuss any questions you have with your health care provider. Document Released: 07/03/2005 Document Revised: 05/13/2016 Document Reviewed: 06/25/2015 Elsevier Interactive Patient Education  2018 [employer] Back Exercises The following exercises strengthen the muscles that help to support the back. They also help to keep the lower back flexible. Doing these exercises can help to prevent back pain or lessen existing pain. If you have back pain or discomfort, try doing these exercises 2-3 times each day or as told by your health care provider. When the pain goes away, do them once each day, but increase the number of times that you repeat the steps for each exercise (do more repetitions). If you do not have back pain or discomfort, do these exercises once each day or as told by your health care provider. Exercises Single Knee to Chest Repeat these steps 3-5 times for each leg: 1. Lie on your back on a firm bed or the floor with your legs extended. 2. Bring one knee to your chest. Your other leg should stay extended and in contact with the floor. 3. Hold your knee in place by grabbing your knee or thigh. 4. Pull on your knee until you feel a gentle stretch in your lower back. 5. Hold the stretch for 10-30 seconds. 6. Slowly release and straighten your leg. Pelvic Tilt Repeat these steps 5-10 times: 1. Lie on your back on a firm bed or the floor with your legs extended. 2. Bend your knees so they are pointing toward the ceiling and your feet are flat on the floor. 3. Tighten your lower abdominal muscles to press your lower back  against the floor. This motion will tilt your pelvis so your tailbone points up toward the ceiling instead of pointing to your feet or the floor. 4. With gentle tension and even breathing, hold this position for 5-10 seconds. Cat-Cow Repeat these steps until your lower back becomes more flexible: 1. Get into a hands-and-knees position on a firm surface. Keep your hands under your shoulders, and keep your   knees under your hips. You may place padding under your knees for comfort. 2. Let your head hang down, and point your tailbone toward the floor so your lower back becomes rounded like the back of a cat. 3. Hold this position for 5 seconds. 4. Slowly lift your head and point your tailbone up toward the ceiling so your back forms a sagging arch like the back of a cow. 5. Hold this position for 5 seconds.  Press-Ups Repeat these steps 5-10 times: 1. Lie on your abdomen (face-down) on the floor. 2. Place your palms near your head, about shoulder-width apart. 3. While you keep your back as relaxed as possible and keep your hips on the floor, slowly straighten your arms to raise the top half of your body and lift your shoulders. Do not use your back muscles to raise your upper torso. You may adjust the placement of your hands to make yourself more comfortable. 4. Hold this position for 5 seconds while you keep your back relaxed. 5. Slowly return to lying flat on the floor.  Bridges Repeat these steps 10 times: 1. Lie on your back on a firm surface. 2. Bend your knees so they are pointing toward the ceiling and your feet are flat on the floor. 3. Tighten your buttocks muscles and lift your buttocks off of the floor until your waist is at almost the same height as your knees. You should feel the muscles working in your buttocks and the back of your thighs. If you do not feel these muscles, slide your feet 1-2 inches farther away from your buttocks. 4. Hold this position for 3-5 seconds. 5. Slowly  lower your hips to the starting position, and allow your buttocks muscles to relax completely. If this exercise is too easy, try doing it with your arms crossed over your chest. Abdominal Crunches Repeat these steps 5-10 times: 1. Lie on your back on a firm bed or the floor with your legs extended. 2. Bend your knees so they are pointing toward the ceiling and your feet are flat on the floor. 3. Cross your arms over your chest. 4. Tip your chin slightly toward your chest without bending your neck. 5. Tighten your abdominal muscles and slowly raise your trunk (torso) high enough to lift your shoulder blades a tiny bit off of the floor. Avoid raising your torso higher than that, because it can put too much stress on your low back and it does not help to strengthen your abdominal muscles. 6. Slowly return to your starting position. Back Lifts Repeat these steps 5-10 times: 1. Lie on your abdomen (face-down) with your arms at your sides, and rest your forehead on the floor. 2. Tighten the muscles in your legs and your buttocks. 3. Slowly lift your chest off of the floor while you keep your hips pressed to the floor. Keep the back of your head in line with the curve in your back. Your eyes should be looking at the floor. 4. Hold this position for 3-5 seconds. 5. Slowly return to your starting position. Contact a health care provider if:  Your back pain or discomfort gets much worse when you do an exercise.  Your back pain or discomfort does not lessen within 2 hours after you exercise. If you have any of these problems, stop doing these exercises right away. Do not do them again unless your health care provider says that you can. Get help right away if:  You develop sudden, severe back   pain. If this happens, stop doing the exercises right away. Do not do them again unless your health care provider says that you can. This information is not intended to replace advice given to you by your health  care provider. Make sure you discuss any questions you have with your health care provider. Document Released: 09/26/2004 Document Revised: 12/23/2017 Document Reviewed: 10/13/2014 Elsevier Interactive Patient Education  2019 [employer]  

## 2019-04-15 ENCOUNTER — Other Ambulatory Visit: Payer: Self-pay | Admitting: Physician Assistant

## 2019-04-15 NOTE — Telephone Encounter (Signed)
Last Visit: 02/24/19 Next Visit: 08/18/19  Okay to refill Lyrica?

## 2019-04-15 NOTE — Telephone Encounter (Signed)
ok 

## 2019-06-20 ENCOUNTER — Other Ambulatory Visit: Payer: Self-pay | Admitting: Interventional Cardiology

## 2019-08-18 ENCOUNTER — Ambulatory Visit: Payer: Medicare HMO | Admitting: Rheumatology

## 2019-09-08 ENCOUNTER — Ambulatory Visit: Payer: Medicare HMO | Admitting: Rheumatology

## 2019-09-20 ENCOUNTER — Other Ambulatory Visit: Payer: Self-pay | Admitting: Interventional Cardiology

## 2019-09-20 MED ORDER — METOPROLOL TARTRATE 25 MG PO TABS: 25.0000 mg | ORAL_TABLET | Freq: Two times a day (BID) | ORAL | 1 refills | Status: DC

## 2019-12-02 NOTE — Progress Notes (Deleted)
Office Visit Note  Patient: [name]             Date of Birth: 04-27-1936           MRN: [id number]             PCP: Willey Blade, MD Referring: Willey Blade, MD Visit Date: 12/07/2019 Occupation: @GUAROCC @  Subjective:  No chief complaint on file.   History of Present Illness: [name] is a 84 y.o. female ***   Activities of Daily Living:  Patient reports morning stiffness for *** {minute/hour:19697}.   Patient {ACTIONS;DENIES/REPORTS:21021675::"Denies"} nocturnal pain.  Difficulty dressing/grooming: {ACTIONS;DENIES/REPORTS:21021675::"Denies"} Difficulty climbing stairs: {ACTIONS;DENIES/REPORTS:21021675::"Denies"} Difficulty getting out of chair: {ACTIONS;DENIES/REPORTS:21021675::"Denies"} Difficulty using hands for taps, buttons, cutlery, and/or writing: {ACTIONS;DENIES/REPORTS:21021675::"Denies"}  No Rheumatology ROS completed.   PMFS History:  Patient Active Problem List   Diagnosis Date Noted  . DDD (degenerative disc disease), lumbar 05/28/2017  . CKD (chronic kidney disease) stage 3, GFR 30-59 ml/min 04/06/2017  . Hyperglycemia 04/06/2017  . Primary osteoarthritis of both knees 11/27/2016  . Osteoarthritis of pelvis 11/27/2016  . Fibromyalgia syndrome 11/27/2016  . Fatigue 11/27/2016  . Snoring 06/28/2016  . Chronic diastolic CHF (congestive heart failure) ([place]) 01/16/2016  . Schatzki's ring 11/11/2014  . Hiatal hernia 11/11/2014  . Essential hypertension   . Dyspnea 05/05/2014  . CAP (community acquired pneumonia) 05/02/2014  . Dysuria 01/26/2014  . Atrophic vaginitis 01/26/2014  . Hyperlipidemia 07/23/2013  . Old MI (myocardial infarction)     Class: Chronic  . Nausea and vomiting 11/13/2011  . Vertigo, benign positional 07/07/2011  . Coronary artery disease involving native coronary artery of native heart with angina pectoris ([place]) 07/07/2011  . A-fib ([place]) 07/07/2011    Past Medical History:  Diagnosis Date  .  Anxiety   . Arthritis    Multiple joints  . Chronic diastolic CHF (congestive heart failure) ([place]) 01/16/2016  . Coronary artery disease    a. s/p DES to LAD 2006. b. DES to Cx 2014. c. angiosculpt scoring balloon/DES to LAD 2016. d. DES to RCA 12/2015.  . Fibromyalgia   . GERD (gastroesophageal reflux disease)   . High cholesterol   . History of stomach ulcers    Remotely  . Hypertension   . Lower extremity edema    a. h/o chronic edema L>R.  [name] Neuropathy   . Osteoporosis   . PAF (paroxysmal atrial fibrillation) ([place])    a. 2012 - H/P says atrial flutter, D/c summary says atrial fib - was tx with IV amiodarone, only had a few hours of arrhythmias, no clinical recurrence as of 2017.  [name] Ventricular hypertrophy   . Vertigo 08/27/2006    Family History  Problem Relation Age of Onset  . Stroke Mother   . Heart attack Father   . Cancer Sister        breast  . Cancer Son        prostate   Past Surgical History:  Procedure Laterality Date  . ABDOMINAL HYSTERECTOMY  1970's  . BREAST BIOPSY Right 1970's  . BREAST LUMPECTOMY Right 1970's   benign   . CARDIAC CATHETERIZATION  10/2006   Archie Endo 08/03/2007 (01/27/2013)  . CARDIAC CATHETERIZATION N/A 01/01/2016   Procedure: Left Heart Cath and Coronary Angiography;  Surgeon: Burnell Blanks, MD;  Location: [place] CV LAB;  Service: Cardiovascular;  Laterality: N/A;  . CARDIAC CATHETERIZATION N/A 01/01/2016   Procedure: Coronary Stent Intervention;  Surgeon: Burnell Blanks, MD;  Location: [facility]  INVASIVE CV LAB;  Service: Cardiovascular;  Laterality: N/A;  . CARDIAC CATHETERIZATION  01/2019  . CORONARY ANGIOPLASTY WITH STENT PLACEMENT  06/2005; 01/27/2013    2.515 mm Cypher stent to LAD in 2006; 2.75 x 12 Promus DES to the circumflex 2014    . DIAGNOSTIC LAPAROSCOPY  2000   lap abdominal lysis of adhesions  . ESOPHAGOGASTRODUODENOSCOPY N/A 11/10/2014   Procedure: ESOPHAGOGASTRODUODENOSCOPY (EGD);  Surgeon: Jeani Hawking, MD;   Location[facility] ENDOSCOPY;  Service: Endoscopy;  Laterality: N/A;  . HERNIA REPAIR    . LAPAROSCOPIC SALPINGO OOPHERECTOMY  2000  . LAPAROTOMY     "day after oophorectomy; had bowel obstruction" (01/27/2013)  . LEFT HEART CATH AND CORONARY ANGIOGRAPHY N/A 04/07/2017   Procedure: LEFT HEART CATH AND CORONARY ANGIOGRAPHY;  Surgeon: Lyn Records, MD;  Location: [facility] INVASIVE CV LAB;  Service: Cardiovascular;  Laterality: N/A;  . LEFT HEART CATHETERIZATION WITH CORONARY ANGIOGRAM N/A 01/27/2013   Procedure: LEFT HEART CATHETERIZATION WITH CORONARY ANGIOGRAM;  Surgeon: Corky Crafts, MD;  Location: [facility] CATH LAB;  Service: Cardiovascular;  Laterality: N/A;  . LEFT HEART CATHETERIZATION WITH CORONARY ANGIOGRAM N/A 11/10/2014   Procedure: LEFT HEART CATHETERIZATION WITH CORONARY ANGIOGRAM;  Surgeon: Lennette Bihari, MD; left main okay, LAD 75% ISR, FFR 0.75, s/p Angiosculpt scoring balloon and 2.7518 mm Resolute DES, CFX 70%, stent patent, RCA 50%  . TONSILLECTOMY AND ADENOIDECTOMY  ~ 41   Social History   Social History Narrative  . Not on file   Immunization History  Administered Date(s) Administered  . Influenza Split 07/07/2011, 06/24/2014  . Pneumococcal-Unspecified 07/07/2011     Objective: Vital Signs: There were no vitals taken for this visit.   Physical Exam   Musculoskeletal Exam: ***  CDAI Exam: CDAI Score: -- Patient Global: --; Provider Global: -- Swollen: --; Tender: -- Joint Exam 12/07/2019   No joint exam has been documented for this visit   There is currently no information documented on the homunculus. Go to the Rheumatology activity and complete the homunculus joint exam.  Investigation: No additional findings.  Imaging: No results found.  Recent Labs: Lab Results  Component Value Date   WBC 4.1 04/08/2017   HGB 10.4 (L) 04/08/2017   PLT 254 04/08/2017   NA 143 04/08/2017   K 3.6 04/08/2017   CL 108 04/08/2017   CO2 27 04/08/2017   GLUCOSE 101 (H)  04/08/2017   BUN 12 04/08/2017   CREATININE 0.99 04/08/2017   BILITOT 0.2 (L) 01/08/2017   ALKPHOS 76 01/08/2017   AST 22 01/08/2017   ALT 17 01/08/2017   PROT 6.7 01/08/2017   ALBUMIN 3.8 01/08/2017   CALCIUM 9.2 04/08/2017   GFRAA >60 04/08/2017    Speciality Comments: No specialty comments available.  Procedures:  No procedures performed Allergies: Adhesive [tape], Crestor [rosuvastatin calcium], Statins, Codeine, and Elavil [amitriptyline]   Assessment / Plan:     Visit Diagnoses: No diagnosis found.  Orders: No orders of the defined types were placed in this encounter.  No orders of the defined types were placed in this encounter.   Face-to-face time spent with patient was *** minutes. Greater than 50% of time was spent in counseling and coordination of care.  Follow-Up Instructions: No follow-ups on file.   Ellen Henri, CMA  Note - This record has been created using Animal nutritionist.  Chart creation errors have been sought, but may not always  have been located. Such creation errors do not reflect on  the standard  of medical care.

## 2019-12-07 ENCOUNTER — Ambulatory Visit: Payer: Medicare HMO | Admitting: Rheumatology

## 2020-01-12 NOTE — Progress Notes (Signed)
Office Visit Note  Patient: [name]             Date of Birth: [birth year]           MRN: [id number]             PCP: Andi Devon, MD Referring: Andi Devon, MD Visit Date: 01/20/2020 Occupation: @GUAROCC @  Subjective:  Arthritis (Right hip pain, gout flare x 3 weeks, )   History of Present Illness: [name] is a 84 y.o. female with history of osteoarthritis.  She states for the last 20 years she has been having episodes of gout mostly involving her toes.  She states she was placed on allopurinol in February by her PCP.  She has been tolerating allopurinol well.  And her gout flares have been less frequent.  She is not on a prophylactic medicine.  She states last week she had a gout flare and she was seen at an urgent care where she was given prednisone 40 mg only 1 dose that resolved her gout flare.  She has done some dietary modifications.  She has been having pain and discomfort in her right hip for about a week.  She states she has been going to cardiac rehab and could not go to the rehab due to the hip pain.  Activities of Daily Living:  Patient reports morning stiffness for 5 minutes.   Patient Denies nocturnal pain.  Difficulty dressing/grooming: Denies Difficulty climbing stairs: Reports Difficulty getting out of chair: Reports Difficulty using hands for taps, buttons, cutlery, and/or writing: Denies  Review of Systems  Constitutional: Negative for fatigue, night sweats, weight gain and weight loss.  HENT: Negative for mouth sores, trouble swallowing, trouble swallowing, mouth dryness and nose dryness.   Eyes: Negative for pain, redness, visual disturbance and dryness.  Respiratory: Negative for cough, shortness of breath and difficulty breathing.   Cardiovascular: Positive for swelling in legs/feet. Negative for chest pain, palpitations, hypertension and irregular heartbeat.  Gastrointestinal: Negative for blood in stool, constipation and  diarrhea.  Endocrine: Positive for cold intolerance. Negative for increased urination.  Genitourinary: Negative for difficulty urinating and vaginal dryness.  Musculoskeletal: Positive for arthralgias, gait problem, joint pain and morning stiffness. Negative for joint swelling, myalgias, muscle weakness, muscle tenderness and myalgias.  Skin: Negative for color change, rash, hair loss, skin tightness, ulcers and sensitivity to sunlight.  Allergic/Immunologic: Negative for susceptible to infections.  Neurological: Positive for numbness. Negative for dizziness, memory loss, night sweats and weakness.  Hematological: Negative for bruising/bleeding tendency and swollen glands.  Psychiatric/Behavioral: Negative for depressed mood and sleep disturbance. The patient is not nervous/anxious.     PMFS History:  Patient Active Problem List   Diagnosis Date Noted  . DDD (degenerative disc disease), lumbar 05/28/2017  . CKD (chronic kidney disease) stage 3, GFR 30-59 ml/min 04/06/2017  . Hyperglycemia 04/06/2017  . Primary osteoarthritis of both knees 11/27/2016  . Osteoarthritis of pelvis 11/27/2016  . Fibromyalgia syndrome 11/27/2016  . Fatigue 11/27/2016  . Snoring 06/28/2016  . Chronic diastolic CHF (congestive heart failure) (HCC) 01/16/2016  . Schatzki's ring 11/11/2014  . Hiatal hernia 11/11/2014  . Essential hypertension   . Dyspnea 05/05/2014  . CAP (community acquired pneumonia) 05/02/2014  . Dysuria 01/26/2014  . Atrophic vaginitis 01/26/2014  . Hyperlipidemia 07/23/2013  . Old MI (myocardial infarction)     Class: Chronic  . Nausea and vomiting 11/13/2011  . Vertigo, benign positional 07/07/2011  . Coronary artery disease involving native  coronary artery of native heart with angina pectoris (HCC) 07/07/2011  . A-fib (HCC) 07/07/2011    Past Medical History:  Diagnosis Date  . Anxiety   . Arthritis    Multiple joints  . Chronic diastolic CHF (congestive heart failure) (HCC)  01/16/2016  . Coronary artery disease    a. s/p DES to LAD 2006. b. DES to Cx 2014. c. angiosculpt scoring balloon/DES to LAD 2016. d. DES to RCA 12/2015.  . Fibromyalgia   . GERD (gastroesophageal reflux disease)   . High cholesterol   . History of stomach ulcers    Remotely  . Hypertension   . Lower extremity edema    a. h/o chronic edema L>R.  [name] Neuropathy   . Osteoporosis   . PAF (paroxysmal atrial fibrillation) (HCC)    a. 2012 - H/P says atrial flutter, D/c summary says atrial fib - was tx with IV amiodarone, only had a few hours of arrhythmias, no clinical recurrence as of 2017.  [name] Ventricular hypertrophy   . Vertigo 08/27/2006    Family History  Problem Relation Age of Onset  . Stroke Mother   . Heart attack Father   . Cancer Sister        breast  . Cancer Son        prostate   Past Surgical History:  Procedure Laterality Date  . ABDOMINAL HYSTERECTOMY  1970's  . BREAST BIOPSY Right 1970's  . BREAST LUMPECTOMY Right 1970's   benign   . CARDIAC CATHETERIZATION  10/2006   [name] 08/03/2007 (01/27/2013)  . CARDIAC CATHETERIZATION N/A 01/01/2016   Procedure: Left Heart Cath and Coronary Angiography;  Surgeon: Kathleene Hazel, MD;  Location: [facility] INVASIVE CV LAB;  Service: Cardiovascular;  Laterality: N/A;  . CARDIAC CATHETERIZATION N/A 01/01/2016   Procedure: Coronary Stent Intervention;  Surgeon: Kathleene Hazel, MD;  Location: [facility] INVASIVE CV LAB;  Service: Cardiovascular;  Laterality: N/A;  . CARDIAC CATHETERIZATION  01/2019  . CORONARY ANGIOPLASTY WITH STENT PLACEMENT  06/2005; 01/27/2013    2.515 mm Cypher stent to LAD in 2006; 2.75 x 12 Promus DES to the circumflex 2014    . DIAGNOSTIC LAPAROSCOPY  2000   lap abdominal lysis of adhesions  . ESOPHAGOGASTRODUODENOSCOPY N/A 11/10/2014   Procedure: ESOPHAGOGASTRODUODENOSCOPY (EGD);  Surgeon: Jeani Hawking, MD;  Location: [facility] ENDOSCOPY;  Service: Endoscopy;  Laterality: N/A;  . heart stent    . HERNIA REPAIR    .  LAPAROSCOPIC SALPINGO OOPHERECTOMY  2000  . LAPAROTOMY     "day after oophorectomy; had bowel obstruction" (01/27/2013)  . LEFT HEART CATH AND CORONARY ANGIOGRAPHY N/A 04/07/2017   Procedure: LEFT HEART CATH AND CORONARY ANGIOGRAPHY;  Surgeon: Lyn Records, MD;  Location: [facility] INVASIVE CV LAB;  Service: Cardiovascular;  Laterality: N/A;  . LEFT HEART CATHETERIZATION WITH CORONARY ANGIOGRAM N/A 01/27/2013   Procedure: LEFT HEART CATHETERIZATION WITH CORONARY ANGIOGRAM;  Surgeon: Corky Crafts, MD;  Location: [facility] CATH LAB;  Service: Cardiovascular;  Laterality: N/A;  . LEFT HEART CATHETERIZATION WITH CORONARY ANGIOGRAM N/A 11/10/2014   Procedure: LEFT HEART CATHETERIZATION WITH CORONARY ANGIOGRAM;  Surgeon: Lennette Bihari, MD; left main okay, LAD 75% ISR, FFR 0.75, s/p Angiosculpt scoring balloon and 2.7518 mm Resolute DES, CFX 70%, stent patent, RCA 50%  . TONSILLECTOMY AND ADENOIDECTOMY  ~ 63   Social History   Social History Narrative  . Not on file   Immunization History  Administered Date(s) Administered  . Influenza Split 07/07/2011, 06/24/2014  . Pneumococcal-Unspecified  07/07/2011     Objective: Vital Signs: BP 122/64 (BP Location: Right Arm, Patient Position: Sitting, Cuff Size: Normal)   Pulse (!) 54   Resp 18   Ht 5' 6.5" (1.689 m)   Wt 199 lb 3.2 oz (90.4 kg)   BMI 31.67 kg/m    Physical Exam Vitals and nursing note reviewed.  Constitutional:      Appearance: She is well-developed.  HENT:     Head: Normocephalic and atraumatic.  Eyes:     Conjunctiva/sclera: Conjunctivae normal.  Cardiovascular:     Rate and Rhythm: Normal rate and regular rhythm.     Heart sounds: Normal heart sounds.  Pulmonary:     Effort: Pulmonary effort is normal.     Breath sounds: Normal breath sounds.  Abdominal:     General: Bowel sounds are normal.     Palpations: Abdomen is soft.  Musculoskeletal:     Cervical back: Normal range of motion.  Lymphadenopathy:     Cervical: No  cervical adenopathy.  Skin:    General: Skin is warm and dry.     Capillary Refill: Capillary refill takes less than 2 seconds.  Neurological:     Mental Status: She is alert and oriented to person, place, and time.  Psychiatric:        Behavior: Behavior normal.      Musculoskeletal Exam: C-spine thoracic and lumbar spine were in good range of motion.  Shoulder joints, elbow joints, wrist joints, MCPs and PIPs with good range of motion.  She has DIP and PIP thickening.  Hip joints with good range of motion.  She has tenderness on palpation of right trochanteric bursa.  She has good range of motion of her knee joints ankles and MTPs.  She has some pedal edema over bilateral ankle joints.  CDAI Exam: CDAI Score: -- Patient Global: --; Provider Global: -- Swollen: --; Tender: -- Joint Exam 01/20/2020   No joint exam has been documented for this visit   There is currently no information documented on the homunculus. Go to the Rheumatology activity and complete the homunculus joint exam.  Investigation: No additional findings.  Imaging: No results found.  Recent Labs: Lab Results  Component Value Date   WBC 4.1 04/08/2017   HGB 10.4 (L) 04/08/2017   PLT 254 04/08/2017   NA 143 04/08/2017   K 3.6 04/08/2017   CL 108 04/08/2017   CO2 27 04/08/2017   GLUCOSE 101 (H) 04/08/2017   BUN 12 04/08/2017   CREATININE 0.99 04/08/2017   BILITOT 0.2 (L) 01/08/2017   ALKPHOS 76 01/08/2017   AST 22 01/08/2017   ALT 17 01/08/2017   PROT 6.7 01/08/2017   ALBUMIN 3.8 01/08/2017   CALCIUM 9.2 04/08/2017   GFRAA >60 04/08/2017    Speciality Comments: No specialty comments available.  Procedures:  Large Joint Inj: R greater trochanter on 01/20/2020 12:09 PM Indications: pain Details: 27 G 1.5 in needle, lateral approach  Arthrogram: No  Medications: 40 mg triamcinolone acetonide 40 MG/ML; 1.5 mL lidocaine 1 % Aspirate: 0 mL Outcome: tolerated well, no immediate  complications Procedure, treatment alternatives, risks and benefits explained, specific risks discussed. Consent was given by the patient. Immediately prior to procedure a time out was called to verify the correct patient, procedure, equipment, support staff and site/side marked as required. Patient was prepped and draped in the usual sterile fashion.     Allergies: Adhesive [tape], Other, Crestor [rosuvastatin calcium], Statins, Codeine, and Elavil [amitriptyline]  Assessment / Plan:     Visit Diagnoses: Trochanteric bursitis, right hip-she has been having increased pain and discomfort in the right trochanteric bursa.  She states the pain is started after being on a train ride to [place].  She is having difficulty walking and going to the rehab.  Per her request right trochanteric bursa was injected with cortisone as described above.  She tolerated the procedure well.  Postprocedure instructions were given.  Primary osteoarthritis of both knees-she has chronic discomfort in her knee joints.  DDD (degenerative disc disease), lumbar-she is off and on pain.  History of gout-she states she was diagnosed with gout by her PCP.  She is on allopurinol.  She had a flare about a week ago which subsided.  Fibromyalgia syndrome-she continues to have some generalized pain and discomfort.  Other fatigue-related to fibromyalgia and insomnia.  Other insomnia  Other medical problems are listed as follows:  History of heart attack  History of hypercholesterolemia  History of coronary artery disease - she is followed by cardiologist.  History of hypertension  Orders: Orders Placed This Encounter  Procedures  . Large Joint Inj   No orders of the defined types were placed in this encounter.    Follow-Up Instructions: Return in about 6 months (around 07/22/2020) for Osteoarthritis.   Pollyann Savoy, MD  Note - This record has been created using Animal nutritionist.  Chart creation errors  have been sought, but may not always  have been located. Such creation errors do not reflect on  the standard of medical care.

## 2020-01-20 ENCOUNTER — Ambulatory Visit (INDEPENDENT_AMBULATORY_CARE_PROVIDER_SITE_OTHER): Payer: Medicare HMO | Admitting: Rheumatology

## 2020-01-20 ENCOUNTER — Other Ambulatory Visit: Payer: Self-pay

## 2020-01-20 ENCOUNTER — Encounter: Payer: Self-pay | Admitting: Rheumatology

## 2020-01-20 VITALS — BP 122/64 | HR 54 | Resp 18 | Ht 66.5 in | Wt 199.2 lb

## 2020-01-20 DIAGNOSIS — M7061 Trochanteric bursitis, right hip: Secondary | ICD-10-CM

## 2020-01-20 DIAGNOSIS — Z8739 Personal history of other diseases of the musculoskeletal system and connective tissue: Secondary | ICD-10-CM | POA: Diagnosis not present

## 2020-01-20 DIAGNOSIS — G4709 Other insomnia: Secondary | ICD-10-CM

## 2020-01-20 DIAGNOSIS — M170 Bilateral primary osteoarthritis of knee: Secondary | ICD-10-CM

## 2020-01-20 DIAGNOSIS — M797 Fibromyalgia: Secondary | ICD-10-CM

## 2020-01-20 DIAGNOSIS — Z8639 Personal history of other endocrine, nutritional and metabolic disease: Secondary | ICD-10-CM

## 2020-01-20 DIAGNOSIS — M5136 Other intervertebral disc degeneration, lumbar region: Secondary | ICD-10-CM

## 2020-01-20 DIAGNOSIS — I252 Old myocardial infarction: Secondary | ICD-10-CM

## 2020-01-20 DIAGNOSIS — Z8679 Personal history of other diseases of the circulatory system: Secondary | ICD-10-CM

## 2020-01-20 DIAGNOSIS — R5383 Other fatigue: Secondary | ICD-10-CM

## 2020-01-20 MED ORDER — TRIAMCINOLONE ACETONIDE 40 MG/ML IJ SUSP
40.0000 mg | INTRAMUSCULAR | Status: AC | PRN
  Administered 2020-01-20: 40 mg via INTRA_ARTICULAR

## 2020-01-20 MED ORDER — LIDOCAINE HCL 1 % IJ SOLN
1.5000 mL | INTRAMUSCULAR | Status: AC | PRN
  Administered 2020-01-20: 1.5 mL

## 2020-03-17 ENCOUNTER — Other Ambulatory Visit: Payer: Self-pay | Admitting: Interventional Cardiology

## 2020-07-06 NOTE — Progress Notes (Deleted)
Office Visit Note  Patient: [name]             Date of Birth: [birth year]           MRN: [id number]             PCP: Andi Devon, MD Referring: Andi Devon, MD Visit Date: 07/20/2020 Occupation: @GUAROCC @  Subjective:  No chief complaint on file.   History of Present Illness: [name] is a 84 y.o. female ***   Activities of Daily Living:  Patient reports morning stiffness for *** {minute/hour:19697}.   Patient {ACTIONS;DENIES/REPORTS:21021675::"Denies"} nocturnal pain.  Difficulty dressing/grooming: {ACTIONS;DENIES/REPORTS:21021675::"Denies"} Difficulty climbing stairs: {ACTIONS;DENIES/REPORTS:21021675::"Denies"} Difficulty getting out of chair: {ACTIONS;DENIES/REPORTS:21021675::"Denies"} Difficulty using hands for taps, buttons, cutlery, and/or writing: {ACTIONS;DENIES/REPORTS:21021675::"Denies"}  No Rheumatology ROS completed.   PMFS History:  Patient Active Problem List   Diagnosis Date Noted  . DDD (degenerative disc disease), lumbar 05/28/2017  . CKD (chronic kidney disease) stage 3, GFR 30-59 ml/min (HCC) 04/06/2017  . Hyperglycemia 04/06/2017  . Primary osteoarthritis of both knees 11/27/2016  . Osteoarthritis of pelvis 11/27/2016  . Fibromyalgia syndrome 11/27/2016  . Fatigue 11/27/2016  . Snoring 06/28/2016  . Chronic diastolic CHF (congestive heart failure) (HCC) 01/16/2016  . Schatzki's ring 11/11/2014  . Hiatal hernia 11/11/2014  . Essential hypertension   . Dyspnea 05/05/2014  . CAP (community acquired pneumonia) 05/02/2014  . Dysuria 01/26/2014  . Atrophic vaginitis 01/26/2014  . Hyperlipidemia 07/23/2013  . Old MI (myocardial infarction)     Class: Chronic  . Nausea and vomiting 11/13/2011  . Vertigo, benign positional 07/07/2011  . Coronary artery disease involving native coronary artery of native heart with angina pectoris (HCC) 07/07/2011  . A-fib (HCC) 07/07/2011    Past Medical History:  Diagnosis Date   . Anxiety   . Arthritis    Multiple joints  . Chronic diastolic CHF (congestive heart failure) (HCC) 01/16/2016  . Coronary artery disease    a. s/p DES to LAD 2006. b. DES to Cx 2014. c. angiosculpt scoring balloon/DES to LAD 2016. d. DES to RCA 12/2015.  . Fibromyalgia   . GERD (gastroesophageal reflux disease)   . High cholesterol   . History of stomach ulcers    Remotely  . Hypertension   . Lower extremity edema    a. h/o chronic edema L>R.  01/2016 Neuropathy   . Osteoporosis   . PAF (paroxysmal atrial fibrillation) (HCC)    a. 2012 - H/P says atrial flutter, D/c summary says atrial fib - was tx with IV amiodarone, only had a few hours of arrhythmias, no clinical recurrence as of 2017.  2018 Ventricular hypertrophy   . Vertigo 08/27/2006    Family History  Problem Relation Age of Onset  . Stroke Mother   . Heart attack Father   . Cancer Sister        breast  . Cancer Son        prostate   Past Surgical History:  Procedure Laterality Date  . ABDOMINAL HYSTERECTOMY  1970's  . BREAST BIOPSY Right 1970's  . BREAST LUMPECTOMY Right 1970's   benign   . CARDIAC CATHETERIZATION  10/2006   11/2006 08/03/2007 (01/27/2013)  . CARDIAC CATHETERIZATION N/A 01/01/2016   Procedure: Left Heart Cath and Coronary Angiography;  Surgeon: 03/02/2016, MD;  Location: [facility] INVASIVE CV LAB;  Service: Cardiovascular;  Laterality: N/A;  . CARDIAC CATHETERIZATION N/A 01/01/2016   Procedure: Coronary Stent Intervention;  Surgeon: 03/02/2016, MD;  Location:  [facility] INVASIVE CV LAB;  Service: Cardiovascular;  Laterality: N/A;  . CARDIAC CATHETERIZATION  01/2019  . CORONARY ANGIOPLASTY WITH STENT PLACEMENT  06/2005; 01/27/2013    2.515 mm Cypher stent to LAD in 2006; 2.75 x 12 Promus DES to the circumflex 2014    . DIAGNOSTIC LAPAROSCOPY  2000   lap abdominal lysis of adhesions  . ESOPHAGOGASTRODUODENOSCOPY N/A 11/10/2014   Procedure: ESOPHAGOGASTRODUODENOSCOPY (EGD);  Surgeon: Jeani Hawking, MD;   Location: [facility] ENDOSCOPY;  Service: Endoscopy;  Laterality: N/A;  . heart stent    . HERNIA REPAIR    . LAPAROSCOPIC SALPINGO OOPHERECTOMY  2000  . LAPAROTOMY     "day after oophorectomy; had bowel obstruction" (01/27/2013)  . LEFT HEART CATH AND CORONARY ANGIOGRAPHY N/A 04/07/2017   Procedure: LEFT HEART CATH AND CORONARY ANGIOGRAPHY;  Surgeon: Lyn Records, MD;  Location: [facility] INVASIVE CV LAB;  Service: Cardiovascular;  Laterality: N/A;  . LEFT HEART CATHETERIZATION WITH CORONARY ANGIOGRAM N/A 01/27/2013   Procedure: LEFT HEART CATHETERIZATION WITH CORONARY ANGIOGRAM;  Surgeon: Corky Crafts, MD;  Location: [facility] CATH LAB;  Service: Cardiovascular;  Laterality: N/A;  . LEFT HEART CATHETERIZATION WITH CORONARY ANGIOGRAM N/A 11/10/2014   Procedure: LEFT HEART CATHETERIZATION WITH CORONARY ANGIOGRAM;  Surgeon: Lennette Bihari, MD; left main okay, LAD 75% ISR, FFR 0.75, s/p Angiosculpt scoring balloon and 2.7518 mm Resolute DES, CFX 70%, stent patent, RCA 50%  . TONSILLECTOMY AND ADENOIDECTOMY  ~ 22   Social History   Social History Narrative  . Not on file   Immunization History  Administered Date(s) Administered  . Influenza Split 07/07/2011, 06/24/2014  . PFIZER SARS-COV-2 Vaccination 09/13/2019, 10/04/2019  . Pneumococcal-Unspecified 07/07/2011     Objective: Vital Signs: There were no vitals taken for this visit.   Physical Exam   Musculoskeletal Exam: ***  CDAI Exam: CDAI Score: -- Patient Global: --; Provider Global: -- Swollen: --; Tender: -- Joint Exam 07/20/2020   No joint exam has been documented for this visit   There is currently no information documented on the homunculus. Go to the Rheumatology activity and complete the homunculus joint exam.  Investigation: No additional findings.  Imaging: No results found.  Recent Labs: Lab Results  Component Value Date   WBC 4.1 04/08/2017   HGB 10.4 (L) 04/08/2017   PLT 254 04/08/2017   NA 143 04/08/2017   K 3.6  04/08/2017   CL 108 04/08/2017   CO2 27 04/08/2017   GLUCOSE 101 (H) 04/08/2017   BUN 12 04/08/2017   CREATININE 0.99 04/08/2017   BILITOT 0.2 (L) 01/08/2017   ALKPHOS 76 01/08/2017   AST 22 01/08/2017   ALT 17 01/08/2017   PROT 6.7 01/08/2017   ALBUMIN 3.8 01/08/2017   CALCIUM 9.2 04/08/2017   GFRAA >60 04/08/2017    Speciality Comments: No specialty comments available.  Procedures:  No procedures performed Allergies: Adhesive [tape], Other, Crestor [rosuvastatin calcium], Statins, Codeine, and Elavil [amitriptyline]   Assessment / Plan:     Visit Diagnoses: No diagnosis found.  Orders: No orders of the defined types were placed in this encounter.  No orders of the defined types were placed in this encounter.   Face-to-face time spent with patient was *** minutes. Greater than 50% of time was spent in counseling and coordination of care.  Follow-Up Instructions: No follow-ups on file.   Ellen Henri, CMA  Note - This record has been created using Animal nutritionist.  Chart creation errors have been sought, but may not  always  have been located. Such creation errors do not reflect on  the standard of medical care.

## 2020-07-20 ENCOUNTER — Ambulatory Visit: Payer: Medicare HMO | Admitting: Rheumatology

## 2020-07-20 DIAGNOSIS — M170 Bilateral primary osteoarthritis of knee: Secondary | ICD-10-CM

## 2020-07-20 DIAGNOSIS — M7061 Trochanteric bursitis, right hip: Secondary | ICD-10-CM

## 2020-07-20 DIAGNOSIS — Z8639 Personal history of other endocrine, nutritional and metabolic disease: Secondary | ICD-10-CM

## 2020-07-20 DIAGNOSIS — I252 Old myocardial infarction: Secondary | ICD-10-CM

## 2020-07-20 DIAGNOSIS — M5136 Other intervertebral disc degeneration, lumbar region: Secondary | ICD-10-CM

## 2020-07-20 DIAGNOSIS — M797 Fibromyalgia: Secondary | ICD-10-CM

## 2020-07-20 DIAGNOSIS — R5383 Other fatigue: Secondary | ICD-10-CM

## 2020-07-20 DIAGNOSIS — Z8679 Personal history of other diseases of the circulatory system: Secondary | ICD-10-CM

## 2020-07-20 DIAGNOSIS — Z8739 Personal history of other diseases of the musculoskeletal system and connective tissue: Secondary | ICD-10-CM

## 2020-07-20 DIAGNOSIS — G4709 Other insomnia: Secondary | ICD-10-CM

## 2020-08-03 NOTE — Progress Notes (Deleted)
Office Visit Note  Patient: [name]             Date of Birth: March 17, 1936           MRN: [id number]             PCP: Andi Devon, MD Referring: Andi Devon, MD Visit Date: 08/17/2020 Occupation: @GUAROCC @  Subjective:  No chief complaint on file.   History of Present Illness: [name] is a 84 y.o. female ***   Activities of Daily Living:  Patient reports morning stiffness for *** {minute/hour:19697}.   Patient {ACTIONS;DENIES/REPORTS:21021675::"Denies"} nocturnal pain.  Difficulty dressing/grooming: {ACTIONS;DENIES/REPORTS:21021675::"Denies"} Difficulty climbing stairs: {ACTIONS;DENIES/REPORTS:21021675::"Denies"} Difficulty getting out of chair: {ACTIONS;DENIES/REPORTS:21021675::"Denies"} Difficulty using hands for taps, buttons, cutlery, and/or writing: {ACTIONS;DENIES/REPORTS:21021675::"Denies"}  No Rheumatology ROS completed.   PMFS History:  Patient Active Problem List   Diagnosis Date Noted  . DDD (degenerative disc disease), lumbar 05/28/2017  . CKD (chronic kidney disease) stage 3, GFR 30-59 ml/min (HCC) 04/06/2017  . Hyperglycemia 04/06/2017  . Primary osteoarthritis of both knees 11/27/2016  . Osteoarthritis of pelvis 11/27/2016  . Fibromyalgia syndrome 11/27/2016  . Fatigue 11/27/2016  . Snoring 06/28/2016  . Chronic diastolic CHF (congestive heart failure) (HCC) 01/16/2016  . Schatzki's ring 11/11/2014  . Hiatal hernia 11/11/2014  . Essential hypertension   . Dyspnea 05/05/2014  . CAP (community acquired pneumonia) 05/02/2014  . Dysuria 01/26/2014  . Atrophic vaginitis 01/26/2014  . Hyperlipidemia 07/23/2013  . Old MI (myocardial infarction)     Class: Chronic  . Nausea and vomiting 11/13/2011  . Vertigo, benign positional 07/07/2011  . Coronary artery disease involving native coronary artery of native heart with angina pectoris (HCC) 07/07/2011  . A-fib (HCC) 07/07/2011    Past Medical History:  Diagnosis Date   . Anxiety   . Arthritis    Multiple joints  . Chronic diastolic CHF (congestive heart failure) (HCC) 01/16/2016  . Coronary artery disease    a. s/p DES to LAD 2006. b. DES to Cx 2014. c. angiosculpt scoring balloon/DES to LAD 2016. d. DES to RCA 12/2015.  . Fibromyalgia   . GERD (gastroesophageal reflux disease)   . High cholesterol   . History of stomach ulcers    Remotely  . Hypertension   . Lower extremity edema    a. h/o chronic edema L>R.  01/2016 Neuropathy   . Osteoporosis   . PAF (paroxysmal atrial fibrillation) (HCC)    a. 2012 - H/P says atrial flutter, D/c summary says atrial fib - was tx with IV amiodarone, only had a few hours of arrhythmias, no clinical recurrence as of 2017.  2018 Ventricular hypertrophy   . Vertigo 08/27/2006    Family History  Problem Relation Age of Onset  . Stroke Mother   . Heart attack Father   . Cancer Sister        breast  . Cancer Son        prostate   Past Surgical History:  Procedure Laterality Date  . ABDOMINAL HYSTERECTOMY  1970's  . BREAST BIOPSY Right 1970's  . BREAST LUMPECTOMY Right 1970's   benign   . CARDIAC CATHETERIZATION  10/2006   11/2006 08/03/2007 (01/27/2013)  . CARDIAC CATHETERIZATION N/A 01/01/2016   Procedure: Left Heart Cath and Coronary Angiography;  Surgeon: 03/02/2016, MD;  Location: [facility] INVASIVE CV LAB;  Service: Cardiovascular;  Laterality: N/A;  . CARDIAC CATHETERIZATION N/A 01/01/2016   Procedure: Coronary Stent Intervention;  Surgeon: 03/02/2016, MD;  Location:  [facility] INVASIVE CV LAB;  Service: Cardiovascular;  Laterality: N/A;  . CARDIAC CATHETERIZATION  01/2019  . CORONARY ANGIOPLASTY WITH STENT PLACEMENT  06/2005; 01/27/2013    2.515 mm Cypher stent to LAD in 2006; 2.75 x 12 Promus DES to the circumflex 2014    . DIAGNOSTIC LAPAROSCOPY  2000   lap abdominal lysis of adhesions  . ESOPHAGOGASTRODUODENOSCOPY N/A 11/10/2014   Procedure: ESOPHAGOGASTRODUODENOSCOPY (EGD);  Surgeon: Jeani Hawking, MD;   Location: [facility] ENDOSCOPY;  Service: Endoscopy;  Laterality: N/A;  . heart stent    . HERNIA REPAIR    . LAPAROSCOPIC SALPINGO OOPHERECTOMY  2000  . LAPAROTOMY     "day after oophorectomy; had bowel obstruction" (01/27/2013)  . LEFT HEART CATH AND CORONARY ANGIOGRAPHY N/A 04/07/2017   Procedure: LEFT HEART CATH AND CORONARY ANGIOGRAPHY;  Surgeon: Lyn Records, MD;  Location: [facility] INVASIVE CV LAB;  Service: Cardiovascular;  Laterality: N/A;  . LEFT HEART CATHETERIZATION WITH CORONARY ANGIOGRAM N/A 01/27/2013   Procedure: LEFT HEART CATHETERIZATION WITH CORONARY ANGIOGRAM;  Surgeon: Corky Crafts, MD;  Location: [facility] CATH LAB;  Service: Cardiovascular;  Laterality: N/A;  . LEFT HEART CATHETERIZATION WITH CORONARY ANGIOGRAM N/A 11/10/2014   Procedure: LEFT HEART CATHETERIZATION WITH CORONARY ANGIOGRAM;  Surgeon: Lennette Bihari, MD; left main okay, LAD 75% ISR, FFR 0.75, s/p Angiosculpt scoring balloon and 2.7518 mm Resolute DES, CFX 70%, stent patent, RCA 50%  . TONSILLECTOMY AND ADENOIDECTOMY  ~ 40   Social History   Social History Narrative  . Not on file   Immunization History  Administered Date(s) Administered  . Influenza Split 07/07/2011, 06/24/2014  . PFIZER SARS-COV-2 Vaccination 09/13/2019, 10/04/2019  . Pneumococcal-Unspecified 07/07/2011     Objective: Vital Signs: There were no vitals taken for this visit.   Physical Exam   Musculoskeletal Exam: ***  CDAI Exam: CDAI Score: -- Patient Global: --; Provider Global: -- Swollen: --; Tender: -- Joint Exam 08/17/2020   No joint exam has been documented for this visit   There is currently no information documented on the homunculus. Go to the Rheumatology activity and complete the homunculus joint exam.  Investigation: No additional findings.  Imaging: No results found.  Recent Labs: Lab Results  Component Value Date   WBC 4.1 04/08/2017   HGB 10.4 (L) 04/08/2017   PLT 254 04/08/2017   NA 143 04/08/2017   K 3.6  04/08/2017   CL 108 04/08/2017   CO2 27 04/08/2017   GLUCOSE 101 (H) 04/08/2017   BUN 12 04/08/2017   CREATININE 0.99 04/08/2017   BILITOT 0.2 (L) 01/08/2017   ALKPHOS 76 01/08/2017   AST 22 01/08/2017   ALT 17 01/08/2017   PROT 6.7 01/08/2017   ALBUMIN 3.8 01/08/2017   CALCIUM 9.2 04/08/2017   GFRAA >60 04/08/2017    Speciality Comments: No specialty comments available.  Procedures:  No procedures performed Allergies: Adhesive [tape], Other, Crestor [rosuvastatin calcium], Statins, Codeine, and Elavil [amitriptyline]   Assessment / Plan:     Visit Diagnoses: No diagnosis found.  Orders: No orders of the defined types were placed in this encounter.  No orders of the defined types were placed in this encounter.   Face-to-face time spent with patient was *** minutes. Greater than 50% of time was spent in counseling and coordination of care.  Follow-Up Instructions: No follow-ups on file.   Ellen Henri, CMA  Note - This record has been created using Animal nutritionist.  Chart creation errors have been sought, but may not  always  have been located. Such creation errors do not reflect on  the standard of medical care.

## 2020-08-17 ENCOUNTER — Ambulatory Visit: Payer: Medicare HMO | Admitting: Rheumatology

## 2020-08-17 DIAGNOSIS — I252 Old myocardial infarction: Secondary | ICD-10-CM

## 2020-08-17 DIAGNOSIS — Z8679 Personal history of other diseases of the circulatory system: Secondary | ICD-10-CM

## 2020-08-17 DIAGNOSIS — G4709 Other insomnia: Secondary | ICD-10-CM

## 2020-08-17 DIAGNOSIS — R5383 Other fatigue: Secondary | ICD-10-CM

## 2020-08-17 DIAGNOSIS — M797 Fibromyalgia: Secondary | ICD-10-CM

## 2020-08-17 DIAGNOSIS — Z8739 Personal history of other diseases of the musculoskeletal system and connective tissue: Secondary | ICD-10-CM

## 2020-08-17 DIAGNOSIS — Z8639 Personal history of other endocrine, nutritional and metabolic disease: Secondary | ICD-10-CM

## 2020-08-17 DIAGNOSIS — M5136 Other intervertebral disc degeneration, lumbar region: Secondary | ICD-10-CM

## 2020-08-17 DIAGNOSIS — M7061 Trochanteric bursitis, right hip: Secondary | ICD-10-CM

## 2020-08-17 DIAGNOSIS — M170 Bilateral primary osteoarthritis of knee: Secondary | ICD-10-CM
